# Patient Record
Sex: Female | Born: 1945 | Race: Black or African American | Hispanic: No | Smoking: Never smoker
Health system: Southern US, Community
[De-identification: ages and names within clinical notes are randomized; demographics above are authoritative.]

## PROBLEM LIST (undated history)

## (undated) DIAGNOSIS — R011 Cardiac murmur, unspecified: Secondary | ICD-10-CM

## (undated) DIAGNOSIS — H269 Unspecified cataract: Secondary | ICD-10-CM

## (undated) DIAGNOSIS — T7840XA Allergy, unspecified, initial encounter: Secondary | ICD-10-CM

## (undated) DIAGNOSIS — M1990 Unspecified osteoarthritis, unspecified site: Secondary | ICD-10-CM

## (undated) HISTORY — PX: BREAST SURGERY: SHX581

## (undated) HISTORY — DX: Unspecified cataract: H26.9

## (undated) HISTORY — PX: TUBAL LIGATION: SHX77

## (undated) HISTORY — DX: Cardiac murmur, unspecified: R01.1

## (undated) HISTORY — DX: Allergy, unspecified, initial encounter: T78.40XA

## (undated) HISTORY — DX: Unspecified osteoarthritis, unspecified site: M19.90

## (undated) HISTORY — PX: AUGMENTATION MAMMAPLASTY: SUR837

## (undated) HISTORY — PX: EYE SURGERY: SHX253

## (undated) HISTORY — PX: COSMETIC SURGERY: SHX468

---

## 1999-05-21 ENCOUNTER — Ambulatory Visit (HOSPITAL_COMMUNITY): Admission: RE | Admit: 1999-05-21 | Discharge: 1999-05-21 | Payer: Self-pay | Admitting: Gastroenterology

## 2000-02-07 ENCOUNTER — Encounter: Payer: Self-pay | Admitting: *Deleted

## 2000-02-07 ENCOUNTER — Emergency Department (HOSPITAL_COMMUNITY): Admission: EM | Admit: 2000-02-07 | Discharge: 2000-02-07 | Payer: Self-pay | Admitting: *Deleted

## 2003-04-13 ENCOUNTER — Encounter: Admission: RE | Admit: 2003-04-13 | Discharge: 2003-04-13 | Payer: Self-pay | Admitting: Plastic Surgery

## 2004-03-27 ENCOUNTER — Other Ambulatory Visit: Admission: RE | Admit: 2004-03-27 | Discharge: 2004-03-27 | Payer: Self-pay | Admitting: Gynecology

## 2006-10-30 ENCOUNTER — Emergency Department (HOSPITAL_COMMUNITY): Admission: EM | Admit: 2006-10-30 | Discharge: 2006-10-30 | Payer: Self-pay | Admitting: Emergency Medicine

## 2008-01-21 ENCOUNTER — Ambulatory Visit: Payer: Self-pay | Admitting: Gastroenterology

## 2008-01-25 ENCOUNTER — Encounter: Admission: RE | Admit: 2008-01-25 | Discharge: 2008-01-25 | Payer: Self-pay | Admitting: Family Medicine

## 2008-02-01 ENCOUNTER — Ambulatory Visit: Payer: Self-pay | Admitting: Gastroenterology

## 2008-02-01 ENCOUNTER — Encounter: Payer: Self-pay | Admitting: Gastroenterology

## 2008-02-03 ENCOUNTER — Encounter: Payer: Self-pay | Admitting: Gastroenterology

## 2010-04-16 ENCOUNTER — Encounter
Admission: RE | Admit: 2010-04-16 | Discharge: 2010-04-16 | Payer: Self-pay | Source: Home / Self Care | Attending: Family Medicine | Admitting: Family Medicine

## 2010-05-07 NOTE — Procedures (Signed)
Summary: Colonoscopy   Colonoscopy  Procedure date:  02/01/2008  Findings:      Location:  [facility].    Procedures Next Due Date:    Colonoscopy: 02/2013  Patient Name: Danielle Kirby, Danielle Kirby MRN:  Procedure Procedures: Colonoscopy CPT: 26948.    with polypectomy. CPT: A3573898.  Personnel: Endoscopist: Barbette Hair. Arlyce Dice, MD.  Exam Location: Outpatient  Patient Consent: Procedure, Alternatives, Risks and Benefits discussed, consent obtained, from patient.  Indications  Average Risk Screening Routine.  History  Current Medications: Patient is not currently taking Coumadin.  Pre-Exam Physical: Performed Feb 01, 2008. Cardio-pulmonary exam, HEENT exam , Abdominal exam, Mental status exam WNL.  Comments: Patient history reviewed/updated, physical performed prior to initiation of sedation? Exam Exam: Extent of exam reached: Cecum, extent intended: Cecum.  The cecum was identified by IC valve. Time to Cecum: 00:02:25. Time for Withdrawl: 00:10:43. Colon retroflexion performed. ASA Classification: I. Tolerance: good.  Monitoring: Pulse and BP monitoring, Oximetry used. Supplemental O2 given. at 2 Liters.  Colon Prep Used Miralax for colon prep. Prep results: good.  Sedation Meds: Patient assessed and found to be appropriate for moderate (conscious) sedation. Sedation was managed by the Endoscopist. Fentanyl 50 mcg. given IV. Versed 5 mg. given IV.  Findings - NORMAL EXAM: Cecum to Descending Colon.  - DIVERTICULOSIS: Descending Colon to Sigmoid Colon. ICD9: Diverticulosis: 562.10. Comments: Scattered diverticula.  NORMAL EXAM: Cecum.  POLYP: Sigmoid Colon, Maximum size: 15 mm. pedunculated polyp. Distance from Anus 35 cm. Procedure:  snare with cautery, Polyp sent to pathology. ICD9: Colon Polyps: 211.3.  - NORMAL EXAM: Sigmoid Colon to Rectum.   Assessment Abnormal examination, see findings above.  Diagnoses: 562.10: Diverticulosis.  211.3: Colon  Polyps.   Events  Unplanned Interventions: No intervention was required.  Unplanned Events: There were no complications. Plans  Post Exam Instructions: Post sedation instructions given.  Patient Education: Patient given standard instructions for: Polyps. Diverticulosis.  Disposition: After procedure patient sent to recovery. After recovery patient sent home.  Scheduling/Referral: Colonoscopy, to Barbette Hair. Arlyce Dice, MD, around Jan 31, 2013.    cc.   Marchia Meiers    REPORT OF SURGICAL PATHOLOGY   Case #: [id number] Patient Name: Danielle Kirby, Danielle Kirby. Office Chart Number:  [id number]   MRN: [id number] Pathologist: Beulah Gandy. Luisa Hart, MD DOB/Age  02-Apr-1946 (Age: 65)    Gender: F Date Taken:  02/01/2008 Date Received: 02/01/2008   FINAL DIAGNOSIS   ***MICROSCOPIC EXAMINATION AND DIAGNOSIS***   COLON, DESCENDING POLYP:  TUBULAR ADENOMA.  NO HIGH GRADE DYSPLASIA OR MALIGNANCY IDENTIFIED.   kv Date Reported:  02/02/2008     Beulah Gandy. Luisa Hart, MD *** Electronically Signed Out By JDP ***   Clinical information R/O adenoma Polypectomy  Screening (jes)    specimen(s) obtained Colon, biopsy, descending   Gross Description Received in formalin is a tan, soft tissue fragment that is submitted in toto.  Size:  0.3 cm One block (ML:jes,02/02/08)    jes/     Signed by Louis Meckel MD on 02/03/2008 at 8:56 AM  ________________________________________________________________________ colo 5 years   Signed by Louis Meckel MD on 02/03/2008 at 8:56 AM   February 03, 2008 MRN: [id number]    [facility] [address] [place], [place]  [address]    Dear Ms. Delair,  I am pleased to inform you that the colon polyp(s) removed during your recent colonoscopy was (were) found to be benign (no cancer detected) upon pathologic examination.  I recommend you have a  repeat colonoscopy examination in _5 years to look for recurrent polyps, as having colon polyps increases  your risk for having recurrent polyps or even colon cancer in the future.  Should you develop new or worsening symptoms of abdominal pain, bowel habit changes or bleeding from the rectum or bowels, please schedule an evaluation with either your primary care physician or with me.  Additional information/recommendations:  __ No further action with gastroenterology is needed at this time. Please      follow-up with your primary care physician for your other healthcare      needs.  __ Please call [phone] to schedule a return visit to review your      situation.  __ Please keep your follow-up visit as already scheduled.  _x_ Continue treatment plan as outlined the day of your exam.  Please call us if you are having persistent problems or have questions about your condition that have not been fully answered at this time.  Sincerely,  Louis Meckel MD  This letter has been electronically signed by your physician.   Signed by Louis Meckel MD on 02/03/2008 at 8:57 AM  _________________   This report was created from the original endoscopy report, which was reviewed and signed by the above listed endoscopist.

## 2010-05-07 NOTE — Miscellaneous (Signed)
Summary: GI PV  Clinical Lists Changes  Medications: Added new medication of DULCOLAX 5 MG  TBEC (BISACODYL) Day before procedure take 2 at 3pm and 2 at 8pm. - Signed Added new medication of METOCLOPRAMIDE HCL 10 MG  TABS (METOCLOPRAMIDE HCL) As per prep instructions. - Signed Added new medication of MIRALAX   POWD (POLYETHYLENE GLYCOL 3350) As per prep  instructions. - Signed Rx of DULCOLAX 5 MG  TBEC (BISACODYL) Day before procedure take 2 at 3pm and 2 at 8pm.;  #4 x 0;  Signed;  Entered by: Doristine Church RN II;  Authorized by: Louis Meckel MD;  Method used: [facility] to [facility]  [address], [place], [place]  [address], Ph: [phone] or [phone], Fax: [phone] Rx of METOCLOPRAMIDE HCL 10 MG  TABS (METOCLOPRAMIDE HCL) As per prep instructions.;  #2 x 0;  Signed;  Entered by: Doristine Church RN II;  Authorized by: Louis Meckel MD;  Method used: [facility]ically [facility]  [place], [place], [place]  [address], Ph: [phone] or [phone], Fax: [phone] Rx of MIRALAX   POWD (POLYETHYLENE GLYCOL 3350) As per prep  instructions.;  #255gm x 0;  Signed;  Entered by: Doristine Church RN II;  Authorized by: Louis Meckel MD;  Method used: [facility]  [place] [place], [place]  [address], Ph: [phone] or [phone], Fax: [phone] Allergies: Added new allergy or adverse reaction of PCN Observations: Added new observation of ALLERGY REV: Done (01/21/2008 13:41) Added new observation of NKA: F (01/21/2008 13:41)    Prescriptions: MIRALAX   POWD (POLYETHYLENE GLYCOL 3350) As per prep  instructions.  #255gm x 0   Entered by:   Doristine Church RN II   Authorized by:   Louis Meckel MD   Signed by:   Doristine Church RN II on 01/21/2008   Method used:   [facility] to        [facility]  [place] (retail)       [address]       [place], [place]  [address]  Ph: [phone] or [phone]       Fax: [phone]   RxID:   [id number] METOCLOPRAMIDE HCL 10 MG  TABS (METOCLOPRAMIDE HCL) As per prep instructions.  #2 x 0   Entered by:   Doristine Church RN II   Authorized by:   Louis Meckel MD   Signed by:   Doristine Church RN II on 01/21/2008   Method used:   [facility]        [facility]  [place] (retail)       [address]       [place], [place]  [address]       Ph: [phone] or [phone]       Fax: [phone]   RxID:   [id number] DULCOLAX 5 MG  TBEC (BISACODYL) Day before procedure take 2 at 3pm and 2 at 8pm.  #4 x 0   Entered by:   Doristine Church RN II   Authorized by:   Louis Meckel MD   Signed by:   Doristine Church RN II on 01/21/2008   Method used:   [facility]        [facility]  [address] (retail)       [address]       [place], [place]  [address]       Ph: [phone] or [phone]  Fax: [phone]   RxID:   [id number]

## 2010-05-07 NOTE — Letter (Signed)
Summary: Patient Notice- Polyp Results  [facility]  [address] [place], [place] [address]   Phone: [phone]  Fax: [phone]        February 03, 2008 MRN: [id number]    [facility] [address] [place], [place]  [address]    Dear Danielle Kirby,  I am pleased to inform you that the colon polyp(s) removed during your recent colonoscopy was (were) found to be benign (no cancer detected) upon pathologic examination.  I recommend you have a repeat colonoscopy examination in _5 years to look for recurrent polyps, as having colon polyps increases your risk for having recurrent polyps or even colon cancer in the future.  Should you develop new or worsening symptoms of abdominal pain, bowel habit changes or bleeding from the rectum or bowels, please schedule an evaluation with either your primary care physician or with me.  Additional information/recommendations:  __ No further action with gastroenterology is needed at this time. Please      follow-up with your primary care physician for your other healthcare      needs.  __ Please call [phone] to schedule a return visit to review your      situation.  __ Please keep your follow-up visit as already scheduled.  _x_ Continue treatment plan as outlined the day of your exam.  Please call us if you are having persistent problems or have questions about your condition that have not been fully answered at this time.  Sincerely,  Louis Meckel MD  This letter has been electronically signed by your physician.

## 2010-08-23 NOTE — Procedures (Signed)
[place]  Patient:    Danielle Kirby, Danielle Kirby                      MRN: [id number] Proc. Date: 05/21/99 Adm. Date:  [id number] Attending:  Charna Elizabeth CC:         Heather Roberts, M.D.                           Procedure Report  DATE OF BIRTH:  [birth year]  REFERRING PHYSICIAN:  Heather Roberts, M.D.  PROCEDURE PERFORMED:  Flexible sigmoidoscopy.  ENDOSCOPIST:  Anselmo Rod, M.D.  INSTRUMENT USED:  Olympus video colonoscope.  INDICATIONS:  Screening flexible sigmoidoscopy being performed in a 65 year old  black female.  Rule out polyps, masses, etc.  PREPROCEDURE PREPARATION:  Informed consent was procured from the patient.  The  patient was fasted for 8 hours prior to the procedure and prepped with two Fleets enemas the morning of the procedure.  PREPROCEDURE PHYSICAL:  Patient has stable vital signs.  NECK: Supple.  CHEST:  Clear to auscultation. S1, S2 regular.  ABDOMEN:  Soft with normal abdominal bowel sounds.  DESCRIPTION OF PROCEDURE:  The patient was placed in left lateral decubitus position, no sedation was used.  Once the patient was adequately positioned, the Olympus video colonoscope was advanced from the rectum to 60 cm without difficulty. There was some solid stool at 60 cm and therefore, the procedure was aborted at  this point.  A small sessile polyp was seen at 28 cm and small internal hemorrhoids were seen on retroflexion in the rectum.  The patient tolerated the procedure well. No other abnormalities were present.  IMPRESSION: 1. Small sessile polyp at 28 cm. 2. Small, nonbleeding internal hemorrhoids.  RECOMMENDATIONS:  Patient is to be scheduled for a colonoscopy at the earliest,  further recommendation made thereafter. DD:  05/21/99 TD:  05/21/99 Job: [id number]

## 2011-04-28 ENCOUNTER — Ambulatory Visit (INDEPENDENT_AMBULATORY_CARE_PROVIDER_SITE_OTHER): Payer: Federal, State, Local not specified - PPO

## 2011-04-28 DIAGNOSIS — M25559 Pain in unspecified hip: Secondary | ICD-10-CM

## 2011-04-28 DIAGNOSIS — E789 Disorder of lipoprotein metabolism, unspecified: Secondary | ICD-10-CM

## 2011-05-12 ENCOUNTER — Ambulatory Visit
Admission: RE | Admit: 2011-05-12 | Discharge: 2011-05-12 | Disposition: A | Discharge status: Home / Self Care | Payer: Federal, State, Local not specified - PPO | Source: Ambulatory Visit | Attending: Family Medicine | Admitting: Family Medicine

## 2011-05-12 ENCOUNTER — Ambulatory Visit (INDEPENDENT_AMBULATORY_CARE_PROVIDER_SITE_OTHER): Payer: Federal, State, Local not specified - PPO | Admitting: Family Medicine

## 2011-05-12 ENCOUNTER — Other Ambulatory Visit: Payer: Self-pay | Admitting: Family Medicine

## 2011-05-12 VITALS — BP 140/90 | Ht 67.0 in | Wt 180.0 lb

## 2011-05-12 DIAGNOSIS — M1610 Unilateral primary osteoarthritis, unspecified hip: Secondary | ICD-10-CM

## 2011-05-12 DIAGNOSIS — R030 Elevated blood-pressure reading, without diagnosis of hypertension: Secondary | ICD-10-CM

## 2011-05-12 NOTE — Patient Instructions (Signed)
Think we are dealing arthritis in her right hip. I would like you to do a trial of 500 mg Tylenol, take 2 of the 500 mg tablets twice a day for 3 days. Compare the pain level at the end of those 3 days with what you're having now. I have set up for some x-rays. I will actually get x-rays of both hips as I think that will be helpful to do some comparison. I would like to see her back next week. It was great to meet you

## 2011-05-12 NOTE — Progress Notes (Signed)
  Subjective:    Patient ID: Danielle Kirby, female    DOB: Aug 28, 1945, 66 y.o.   MRN: [id number]  HPI  Right thigh in the hip pain for about 3 months. Started noticing it in October or November. Came on gradually. Initially started as an aching in her anterior right thigh. Her doctor gave her some type of medicine, she is unclear exactly what it was, indeed improve while she was on the medicine but when she stopped it came back. She has pain it is worse if she climbs stairs. She has aching pain particularly at night.  PERTINENT  PMH / PSH: History of elevated blood pressure without diagnosis of hypertension. She says she is taking some herbal remedies to help keep this under control. No prior history of hip injury or surgery. No history of back injury or surgery. Nonsmoker No DM  Review of Systems    denies numbness or paresthesias in either lower extremity. Leg does not feel week. Objective:   Physical Exam  Vital signs reviewed. GENERAL: Well developed, well nourished, no acute distress HIPS: Slight decrease in internal rotation and external rotation on the right compared with the left. There is about 5-10 difference, and notably there is some fairly significant pain with internal rotation and flexion as well as external rotation and flexion. Pain with figure of 4. NEURO:DTR 2+ B = knee and ankle MSK: normal muscle bulk and tone B quadriceps     Assessment & Plan:  Right hip and thigh pain. Most consistent with hip arthritis. We discussed options, she really doesn't want to be on any kind of chronic medication but agrees to give Tylenol a  3 day trial. She's also going to investigate other herbal supplements for analgesia. We'll get hip films and  see her back in a week.

## 2011-05-13 ENCOUNTER — Encounter: Payer: Self-pay | Admitting: Family Medicine

## 2011-05-13 NOTE — Progress Notes (Signed)
Reviewed hip x rays---disagree with radiology reading---to me both hips have some significant changes of OA with sclerosis. Osteophytees, small amount of flattening of acetabulum (right) and esp the right hip looks more significant for OA. She has f/u 1 week and will review with her at that time

## 2011-05-19 ENCOUNTER — Ambulatory Visit: Payer: Federal, State, Local not specified - PPO | Admitting: Family Medicine

## 2011-05-23 ENCOUNTER — Ambulatory Visit (INDEPENDENT_AMBULATORY_CARE_PROVIDER_SITE_OTHER): Payer: Federal, State, Local not specified - PPO | Admitting: Family Medicine

## 2011-05-23 VITALS — BP 146/86

## 2011-05-23 DIAGNOSIS — M169 Osteoarthritis of hip, unspecified: Secondary | ICD-10-CM

## 2011-05-23 NOTE — Progress Notes (Signed)
  Subjective:    Patient ID: Danielle Kirby, female    DOB: [birth year], 66 y.o.   MRN: [id number]  HPI  Followup pain: Took Tylenol for 3 days and the pain went almost entirely away. She stopped taking the Tylenol and has not really had much pain return. She did have x-rays done and is here to review those.  Review of Systems Denies fever.    Objective:   Physical Exam   Vital signs reviewed. Slight elevation in blood pressure noted and discussed with patient GENERAL: Well developed, well nourished, no acute distress  IMAGING: Bilateral hip films show some sclerosis, osteophyte formation, minimal joint space loss and some beginning osteophyte formation consistent with moderate arthritis.      Assessment & Plan:  #1. Bilateral hip pain secondary to moderate arthritis. Discussed with patient at length. Tylenol seem to work really well for her but she is much more inclined to use some natural remedies. We discussed this at length. Whatever seems to work for her. I do think Tylenol would be the first line medication that I would recommend a recommended dose of up to 3 g a day. She can followup when necessary. I did review her films with her in detail. #2. Elevated blood pressure. This is being followed by her PCP I urged her to return to him for further followup

## 2011-08-20 ENCOUNTER — Ambulatory Visit (INDEPENDENT_AMBULATORY_CARE_PROVIDER_SITE_OTHER): Payer: Federal, State, Local not specified - PPO | Admitting: Family Medicine

## 2011-08-20 VITALS — BP 157/98 | HR 69 | Temp 98.4°F | Resp 18 | Ht 67.5 in | Wt 181.8 lb

## 2011-08-20 DIAGNOSIS — E785 Hyperlipidemia, unspecified: Secondary | ICD-10-CM

## 2011-08-20 DIAGNOSIS — I10 Essential (primary) hypertension: Secondary | ICD-10-CM

## 2011-08-20 LAB — LIPID PANEL
Cholesterol: 207 mg/dL — ABNORMAL HIGH (ref 0–200)
HDL: 65 mg/dL (ref >39)
LDL Cholesterol: 130 mg/dL — ABNORMAL HIGH (ref 0–99)
Total CHOL/HDL Ratio: 3.2 Ratio
Triglycerides: 62 mg/dL (ref <150)
VLDL: 12 mg/dL (ref 0–40)

## 2011-08-20 LAB — COMPREHENSIVE METABOLIC PANEL
ALT: 17 U/L (ref 0–35)
AST: 23 U/L (ref 0–37)
Albumin: 4.5 g/dL (ref 3.5–5.2)
Alkaline Phosphatase: 79 U/L (ref 39–117)
BUN: 9 mg/dL (ref 6–23)
CO2: 27 mEq/L (ref 19–32)
Calcium: 9.8 mg/dL (ref 8.4–10.5)
Chloride: 107 mEq/L (ref 96–112)
Creat: 0.72 mg/dL (ref 0.50–1.10)
Glucose, Bld: 84 mg/dL (ref 70–99)
Potassium: 4.2 mEq/L (ref 3.5–5.3)
Sodium: 141 mEq/L (ref 135–145)
Total Bilirubin: 0.5 mg/dL (ref 0.3–1.2)
Total Protein: 7.5 g/dL (ref 6.0–8.3)

## 2011-08-20 MED ORDER — AMLODIPINE BESYLATE 5 MG PO TABS: 5.0000 mg | ORAL_TABLET | Freq: Every day | ORAL | Status: DC

## 2011-08-20 NOTE — Progress Notes (Signed)
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 He comes in with recent blood pressure elevations. She notes that she's been less active the last 2 months by walking in front of a computer all the time. In addition she's taking some supplements to help control cholesterol.  She wants to have the blood pressure evaluated and get a cholesterol evaluated.    Objective: HEENT: Unremarkable  Patient in no acute distress, cooperative and calm  Chest: Clear  Heart: Regular no murmur  Abdomen soft  Assessment: Middle-age woman with recent elevation of blood pressure, history of elevated cholesterol  Plan: Start amlodipine, check lipids, check cemented.  Best patient to start walking at least 20 minutes a day and when back in July.

## 2011-10-16 ENCOUNTER — Ambulatory Visit: Payer: Federal, State, Local not specified - PPO | Admitting: Family Medicine

## 2012-02-25 ENCOUNTER — Ambulatory Visit (INDEPENDENT_AMBULATORY_CARE_PROVIDER_SITE_OTHER): Payer: Federal, State, Local not specified - PPO | Admitting: Family Medicine

## 2012-02-25 VITALS — BP 126/84 | HR 71 | Temp 98.2°F | Resp 17 | Ht 67.5 in | Wt 192.0 lb

## 2012-02-25 DIAGNOSIS — L608 Other nail disorders: Secondary | ICD-10-CM

## 2012-02-25 DIAGNOSIS — I10 Essential (primary) hypertension: Secondary | ICD-10-CM

## 2012-02-25 NOTE — Progress Notes (Signed)
66 yo woman with hypertension who is now 90 days into the Norvasc.  No problems taking the medicine.  Also, she has vertical hyperpigmented streaks on multiple toenails She takes multiple supplements   O:  Toenails show dark vertical streaks on great toenails and smaller nails as well  A:  Blood pressure now at goal  P:  Hold the amlodipine for 2 weeks I will research the vertical nail hyperpigmentation.

## 2012-02-27 ENCOUNTER — Other Ambulatory Visit: Payer: Self-pay | Admitting: Family Medicine

## 2012-02-27 DIAGNOSIS — L819 Disorder of pigmentation, unspecified: Secondary | ICD-10-CM

## 2012-07-03 ENCOUNTER — Encounter: Payer: Self-pay | Admitting: Family Medicine

## 2012-07-03 ENCOUNTER — Ambulatory Visit (INDEPENDENT_AMBULATORY_CARE_PROVIDER_SITE_OTHER): Payer: Federal, State, Local not specified - PPO | Admitting: Family Medicine

## 2012-07-03 VITALS — BP 158/83 | HR 71 | Temp 97.8°F | Resp 16 | Ht 67.5 in | Wt 190.6 lb

## 2012-07-03 DIAGNOSIS — B354 Tinea corporis: Secondary | ICD-10-CM

## 2012-07-03 MED ORDER — KETOCONAZOLE 2 % EX CREA: TOPICAL_CREAM | Freq: Every day | CUTANEOUS | Status: DC

## 2012-07-03 NOTE — Progress Notes (Signed)
67 yo woman with right sided chest and neck annular itchy rash for several weeks, enlarging steadily.  She has been trying a zinc product  Objective:  NAD 3 cm annular right upper anterior chest rash characterized by hyperpigmentation.  2 cm annular right anterior neck rash.  Assessment:  Tinea corporis  Tinea corporis - Plan: ketoconazole (NIZORAL) 2 % cream

## 2012-07-03 NOTE — Patient Instructions (Addendum)
Ringworm, Body [Tinea Corporis] Ringworm is a fungal infection of the skin and hair. Another name for this problem is Tinea Corporis. It has nothing to do with worms. A fungus is an organism that lives on dead cells (the outer layer of skin). It can involve the entire body. It can spread from infected pets. Tinea corporis can be a problem in wrestlers who may get the infection form other players/opponents, equipment and mats. DIAGNOSIS  A skin scraping can be obtained from the affected area and by looking for fungus under the microscope. This is called a KOH examination.  HOME CARE INSTRUCTIONS   Ringworm may be treated with a topical antifungal cream, ointment, or oral medications.  If you are using a cream or ointment, wash infected skin. Dry it completely before application.  Scrub the skin with a buff puff or abrasive sponge using a shampoo with ketoconazole to remove dead skin and help treat the ringworm.  Have your pet treated by your veterinarian if it has the same infection. SEEK MEDICAL CARE IF:   Your ringworm patch (fungus) continues to spread after 7 days of treatment.  Your rash is not gone in 4 weeks. Fungal infections are slow to respond to treatment. Some redness (erythema) may remain for several weeks after the fungus is gone.  The area becomes red, warm, tender, and swollen beyond the patch. This may be a secondary bacterial (germ) infection.  You have a fever. Document Released: 03/21/2000 Document Revised: 06/16/2011 Document Reviewed: 09/01/2008 Exit[employer] Patient Information 2013 [employer].  

## 2012-09-03 ENCOUNTER — Other Ambulatory Visit: Payer: Self-pay | Admitting: Family Medicine

## 2012-09-03 DIAGNOSIS — M549 Dorsalgia, unspecified: Secondary | ICD-10-CM

## 2012-09-03 MED ORDER — CYCLOBENZAPRINE HCL 10 MG PO TABS: 10.0000 mg | ORAL_TABLET | Freq: Two times a day (BID) | ORAL | Status: DC | PRN

## 2012-09-03 MED ORDER — PREDNISONE 20 MG PO TABS: ORAL_TABLET | ORAL | Status: DC

## 2012-09-30 ENCOUNTER — Encounter: Payer: Self-pay | Admitting: Family Medicine

## 2012-09-30 ENCOUNTER — Ambulatory Visit (INDEPENDENT_AMBULATORY_CARE_PROVIDER_SITE_OTHER): Payer: Federal, State, Local not specified - PPO | Admitting: Family Medicine

## 2012-09-30 VITALS — BP 140/94 | HR 75 | Temp 97.8°F | Resp 16 | Ht 67.0 in | Wt 187.0 lb

## 2012-09-30 DIAGNOSIS — Z0000 Encounter for general adult medical examination without abnormal findings: Secondary | ICD-10-CM

## 2012-09-30 DIAGNOSIS — B354 Tinea corporis: Secondary | ICD-10-CM

## 2012-09-30 LAB — COMPREHENSIVE METABOLIC PANEL
ALT: 10 U/L (ref 0–35)
AST: 18 U/L (ref 0–37)
Albumin: 4.2 g/dL (ref 3.5–5.2)
Alkaline Phosphatase: 81 U/L (ref 39–117)
BUN: 12 mg/dL (ref 6–23)
CO2: 26 mEq/L (ref 19–32)
Calcium: 9.5 mg/dL (ref 8.4–10.5)
Chloride: 109 mEq/L (ref 96–112)
Creat: 0.76 mg/dL (ref 0.50–1.10)
Glucose, Bld: 93 mg/dL (ref 70–99)
Potassium: 4.2 mEq/L (ref 3.5–5.3)
Sodium: 142 mEq/L (ref 135–145)
Total Bilirubin: 0.4 mg/dL (ref 0.3–1.2)
Total Protein: 7 g/dL (ref 6.0–8.3)

## 2012-09-30 LAB — CBC WITH DIFFERENTIAL/PLATELET
Basophils Absolute: 0 10*3/uL (ref 0.0–0.1)
Basophils Relative: 1 % (ref 0–1)
Eosinophils Absolute: 0.2 10*3/uL (ref 0.0–0.7)
Eosinophils Relative: 5 % (ref 0–5)
HCT: 36 % (ref 36.0–46.0)
Hemoglobin: 12.3 g/dL (ref 12.0–15.0)
Lymphocytes Relative: 41 % (ref 12–46)
Lymphs Abs: 1.2 10*3/uL (ref 0.7–4.0)
MCH: 28.8 pg (ref 26.0–34.0)
MCHC: 34.2 g/dL (ref 30.0–36.0)
MCV: 84.3 fL (ref 78.0–100.0)
Monocytes Absolute: 0.3 10*3/uL (ref 0.1–1.0)
Monocytes Relative: 8 % (ref 3–12)
Neutro Abs: 1.4 10*3/uL — ABNORMAL LOW (ref 1.7–7.7)
Neutrophils Relative %: 45 % (ref 43–77)
Platelets: 183 10*3/uL (ref 150–400)
RBC: 4.27 MIL/uL (ref 3.87–5.11)
RDW: 14.4 % (ref 11.5–15.5)
WBC: 3 10*3/uL — ABNORMAL LOW (ref 4.0–10.5)

## 2012-09-30 LAB — POCT URINALYSIS DIPSTICK
Bilirubin, UA: NEGATIVE
Glucose, UA: NEGATIVE
Ketones, UA: NEGATIVE
Nitrite, UA: NEGATIVE
Protein, UA: NEGATIVE
Spec Grav, UA: 1.02
Urobilinogen, UA: 0.2
pH, UA: 6

## 2012-09-30 LAB — TSH: TSH: 1.348 u[IU]/mL (ref 0.350–4.500)

## 2012-09-30 LAB — LIPID PANEL
Cholesterol: 212 mg/dL — ABNORMAL HIGH (ref 0–200)
HDL: 61 mg/dL (ref >39)
LDL Cholesterol: 135 mg/dL — ABNORMAL HIGH (ref 0–99)
Total CHOL/HDL Ratio: 3.5 Ratio
Triglycerides: 82 mg/dL (ref <150)
VLDL: 16 mg/dL (ref 0–40)

## 2012-09-30 MED ORDER — PNEUMOCOCCAL VAC POLYVALENT 25 MCG/0.5ML IJ INJ: 0.5000 mL | INJECTION | INTRAMUSCULAR | Status: DC

## 2012-09-30 MED ORDER — KETOCONAZOLE 2 % EX CREA: TOPICAL_CREAM | Freq: Every day | CUTANEOUS | Status: DC

## 2012-09-30 NOTE — Progress Notes (Addendum)
Patient ID: Danielle Kirby MRN: [id number], DOB: [birth year], 67 y.o. Date of Encounter: 09/30/2012, 8:19 AM  Primary Physician: [name], MD  Chief Complaint: Physical (CPE)  HPI: 67 y.o. y/o female with history of noted below here for CPE.  Doing well. No issues/complaints. Sister is in renal failure x 2 months.  Had been in excellent health. Walking some, needs to do more   Pap: 2013 MMG: 2012 Review of Systems: Consitutional: No fever, chills, fatigue, night sweats, lymphadenopathy, or weight changes. Eyes: No visual changes, eye redness, or discharge. ENT/Mouth: Ears: No otalgia, tinnitus, hearing loss, discharge. Nose: No congestion, rhinorrhea, sinus pain, or epistaxis. Throat: No sore throat, post nasal drip, or teeth pain. Cardiovascular: No CP, palpitations, diaphoresis, DOE, edema, orthopnea, PND. Respiratory: No cough, hemoptysis, SOB, or wheezing. Gastrointestinal: No anorexia, dysphagia, reflux, pain, nausea, vomiting, hematemesis, diarrhea, constipation, BRBPR, or melena. Breast: No discharge, pain, swelling, or mass. Genitourinary: No dysuria, frequency, urgency, hematuria, incontinence, nocturia, amenorrhea, vaginal discharge, pruritis, burning, abnormal bleeding, or pain. Musculoskeletal: No decreased ROM, myalgias, stiffness, joint swelling, or weakness. Skin: No rash, erythema, lesion changes, pain, warmth, jaundice, or pruritis. Neurological: No headache, dizziness, syncope, seizures, tremors, memory loss, coordination problems, or paresthesias. Psychological: No anxiety, depression, hallucinations, SI/HI. Endocrine: No fatigue, polydipsia, polyphagia, polyuria, or known diabetes. All other systems were reviewed and are otherwise negative.  Past Medical History  Diagnosis Date  . Allergy   . Arthritis      Past Surgical History  Procedure Laterality Date  . Breast surgery    . Cosmetic surgery    . Tubal ligation      Home Meds:  Prior to  Admission medications   Not on File    Allergies:  Allergies  Allergen Reactions  . Penicillins     REACTION: hives    History   Social History  . Marital Status: Single    Spouse Name: N/A    Number of Children: N/A  . Years of Education: N/A   Occupational History  . Not on file.   Social History Main Topics  . Smoking status: Never Smoker   . Smokeless tobacco: Not on file  . Alcohol Use: No  . Drug Use: No  . Sexually Active: Yes    Birth Control/ Protection: None   Other Topics Concern  . Not on file   Social History Narrative  . No narrative on file    No family history on file.  Physical Exam: 140/80 second check left arm sitting  Blood pressure 140/94, pulse 75, temperature 97.8 F (36.6 C), temperature source Oral, resp. rate 16, height 5\' 7"  (1.702 m), weight 187 lb (84.823 kg), SpO2 99.00%., Body mass index is 29.28 kg/(m^2). General: Well developed, well nourished, in no acute distress. HEENT: Normocephalic, atraumatic. Conjunctiva pink, sclera non-icteric. Pupils 2 mm constricting to 1 mm, round, regular, and equally reactive to light and accomodation. EOMI. Internal auditory canal clear. TMs with good cone of light and without pathology. Nasal mucosa pink. Nares are without discharge. No sinus tenderness. Oral mucosa pink. Dentition good. Pharynx without exudate.   Neck: Supple. Trachea midline. No thyromegaly. Full ROM. No lymphadenopathy. Lungs: Clear to auscultation bilaterally without wheezes, rales, or rhonchi. Breathing is of normal effort and unlabored. Cardiovascular: RRR with S1 S2. No murmurs, rubs, or gallops appreciated. Distal pulses 2+ symmetrically. No carotid or abdominal bruits. Breast: Symmetrical. No masses. Nipples without discharge. Abdomen: Soft, non-tender, non-distended with normoactive bowel sounds. No hepatosplenomegaly or masses. No  rebound/guarding. No CVA tenderness. Without hernias.  Genitourinary:  External genitalia  without lesions. Vaginal mucosa pink. Cervix pink and without discharge. No cervical or adnexal tenderness. Pap smear taken Musculoskeletal: Full range of motion and 5/5 strength throughout. Without swelling, atrophy, tenderness, crepitus, or warmth. Extremities without clubbing, cyanosis, or edema. Calves supple. Skin: Warm and moist without erythema, ecchymosis, wounds  Patient has 2 cm annular right upper chest hyperpigmented plaque with central clearing. Neuro: A+Ox3. CN II-XII grossly intact. Moves all extremities spontaneously. Full sensation throughout. Normal gait. DTR 2+ throughout upper and lower extremities. Finger to nose intact. Psych:  Responds to questions appropriately with a normal affect.   Studies: CBC, CMET, Lipid, TSH, Vitamin D all pending.  Assessment/Plan:  66 y.o. y/o female here for CPE - Annual physical exam - Plan: Comprehensive metabolic panel, Lipid panel, TSH, POCT urinalysis dipstick, CBC with Differential, Vitamin D, 25-hydroxy, Pap IG (Image Guided), DISCONTINUED: pneumococcal 23 valent vaccine (PNU-IMMUNE) injection 0.5 mL  Tinea corporis - Plan: ketoconazole (NIZORAL) 2 % cream  Patient refuses pneumonia vaccine.  Signed, [name], MD 09/30/2012 8:19 AM

## 2012-10-01 LAB — VITAMIN D 25 HYDROXY (VIT D DEFICIENCY, FRACTURES): Vit D, 25-Hydroxy: 24 ng/mL — ABNORMAL LOW (ref 30–89)

## 2012-10-01 LAB — PAP IG (IMAGE GUIDED)

## 2012-11-15 ENCOUNTER — Encounter: Payer: Self-pay | Admitting: Gastroenterology

## 2013-05-20 ENCOUNTER — Ambulatory Visit (INDEPENDENT_AMBULATORY_CARE_PROVIDER_SITE_OTHER): Payer: Federal, State, Local not specified - PPO | Admitting: Family Medicine

## 2013-05-20 ENCOUNTER — Ambulatory Visit: Payer: Federal, State, Local not specified - PPO

## 2013-05-20 VITALS — BP 140/100 | HR 83 | Temp 97.0°F | Resp 16 | Ht 67.0 in | Wt 193.0 lb

## 2013-05-20 DIAGNOSIS — R3915 Urgency of urination: Secondary | ICD-10-CM

## 2013-05-20 DIAGNOSIS — N390 Urinary tract infection, site not specified: Secondary | ICD-10-CM

## 2013-05-20 DIAGNOSIS — M62838 Other muscle spasm: Secondary | ICD-10-CM

## 2013-05-20 DIAGNOSIS — M549 Dorsalgia, unspecified: Secondary | ICD-10-CM

## 2013-05-20 LAB — POCT UA - MICROSCOPIC ONLY
Casts, Ur, LPF, POC: NEGATIVE
Crystals, Ur, HPF, POC: NEGATIVE
Mucus, UA: NEGATIVE
Yeast, UA: NEGATIVE

## 2013-05-20 LAB — BASIC METABOLIC PANEL
BUN: 8 mg/dL (ref 6–23)
CO2: 29 mEq/L (ref 19–32)
Calcium: 10.4 mg/dL (ref 8.4–10.5)
Chloride: 101 mEq/L (ref 96–112)
Creat: 0.7 mg/dL (ref 0.50–1.10)
Glucose, Bld: 90 mg/dL (ref 70–99)
Potassium: 4.5 mEq/L (ref 3.5–5.3)
Sodium: 138 mEq/L (ref 135–145)

## 2013-05-20 LAB — POCT URINALYSIS DIPSTICK
Bilirubin, UA: NEGATIVE
Glucose, UA: NEGATIVE
Ketones, UA: NEGATIVE
Nitrite, UA: NEGATIVE
Protein, UA: NEGATIVE
Spec Grav, UA: <=1.005
Urobilinogen, UA: 0.2
pH, UA: 6.5

## 2013-05-20 MED ORDER — CIPROFLOXACIN HCL 250 MG PO TABS: 250.0000 mg | ORAL_TABLET | Freq: Two times a day (BID) | ORAL | Status: DC

## 2013-05-20 MED ORDER — CYCLOBENZAPRINE HCL 5 MG PO TABS: ORAL_TABLET | ORAL | Status: DC

## 2013-05-20 NOTE — Patient Instructions (Signed)
Start antibiotic for urinary tract infection until urine culture results known. Drink plenty of fluids. Muscle relaxant up to every 8 hours as needed but be careful with this medicine causing sedation and risk of falling. If not improving in the next 2-3 days or worse sooner, return to clinic or ER. Return to the clinic or go to the nearest emergency room if any of your symptoms worsen or new symptoms occur. You should receive a call or letter about your lab results within the next week to 10 days.   Urinary Tract Infection Urinary tract infections (UTIs) can develop anywhere along your urinary tract. Your urinary tract is your body's drainage system for removing wastes and extra water. Your urinary tract includes two kidneys, two ureters, a bladder, and a urethra. Your kidneys are a pair of bean-shaped organs. Each kidney is about the size of your fist. They are located below your ribs, one on each side of your spine. CAUSES Infections are caused by microbes, which are microscopic organisms, including fungi, viruses, and bacteria. These organisms are so small that they can only be seen through a microscope. Bacteria are the microbes that most commonly cause UTIs. SYMPTOMS  Symptoms of UTIs may vary by age and gender of the patient and by the location of the infection. Symptoms in young women typically include a frequent and intense urge to urinate and a painful, burning feeling in the bladder or urethra during urination. Older women and men are more likely to be tired, shaky, and weak and have muscle aches and abdominal pain. A fever may mean the infection is in your kidneys. Other symptoms of a kidney infection include pain in your back or sides below the ribs, nausea, and vomiting. DIAGNOSIS To diagnose a UTI, your caregiver will ask you about your symptoms. Your caregiver also will ask to provide a urine sample. The urine sample will be tested for bacteria and white blood cells. White blood cells are  made by your body to help fight infection. TREATMENT  Typically, UTIs can be treated with medication. Because most UTIs are caused by a bacterial infection, they usually can be treated with the use of antibiotics. The choice of antibiotic and length of treatment depend on your symptoms and the type of bacteria causing your infection. HOME CARE INSTRUCTIONS  If you were prescribed antibiotics, take them exactly as your caregiver instructs you. Finish the medication even if you feel better after you have only taken some of the medication.  Drink enough water and fluids to keep your urine clear or pale yellow.  Avoid caffeine, tea, and carbonated beverages. They tend to irritate your bladder.  Empty your bladder often. Avoid holding urine for long periods of time.  Empty your bladder before and after sexual intercourse.  After a bowel movement, women should cleanse from front to back. Use each tissue only once. SEEK MEDICAL CARE IF:   You have back pain.  You develop a fever.  Your symptoms do not begin to resolve within 3 days. SEEK IMMEDIATE MEDICAL CARE IF:   You have severe back pain or lower abdominal pain.  You develop chills.  You have nausea or vomiting.  You have continued burning or discomfort with urination. MAKE SURE YOU:   Understand these instructions.  Will watch your condition.  Will get help right away if you are not doing well or get worse. Document Released: 01/01/2005 Document Revised: 09/23/2011 Document Reviewed: 05/02/2011 [facility] Patient Information 2014 [place], [place]. Muscle Cramps and  Spasms Muscle cramps and spasms occur when a muscle or muscles tighten and you have no control over this tightening (involuntary muscle contraction). They are a common problem and can develop in any muscle. The most common place is in the calf muscles of the leg. Both muscle cramps and muscle spasms are involuntary muscle contractions, but they also have differences:     Muscle cramps are sporadic and painful. They may last a few seconds to a quarter of an hour. Muscle cramps are often more forceful and last longer than muscle spasms.  Muscle spasms may or may not be painful. They may also last just a few seconds or much longer. CAUSES  It is uncommon for cramps or spasms to be due to a serious underlying problem. In many cases, the cause of cramps or spasms is unknown. Some common causes are:   Overexertion.   Overuse from repetitive motions (doing the same thing over and over).   Remaining in a certain position for a long period of time.   Improper preparation, form, or technique while performing a sport or activity.   Dehydration.   Injury.   Side effects of some medicines.   Abnormally low levels of the salts and ions in your blood (electrolytes), especially potassium and calcium. This could happen if you are taking water pills (diuretics) or you are pregnant.  Some underlying medical problems can make it more likely to develop cramps or spasms. These include, but are not limited to:   Diabetes.   Parkinson disease.   Hormone disorders, such as thyroid problems.   Alcohol abuse.   Diseases specific to muscles, joints, and bones.   Blood vessel disease where not enough blood is getting to the muscles.  HOME CARE INSTRUCTIONS   Stay well hydrated. Drink enough water and fluids to keep your urine clear or pale yellow.  It may be helpful to massage, stretch, and relax the affected muscle.  For tight or tense muscles, use a warm towel, heating pad, or hot shower water directed to the affected area.  If you are sore or have pain after a cramp or spasm, applying ice to the affected area may relieve discomfort.  Put ice in a plastic bag.  Place a towel between your skin and the bag.  Leave the ice on for 15-20 minutes, 03-04 times a day.  Medicines used to treat a known cause of cramps or spasms may help reduce their  frequency or severity. Only take over-the-counter or prescription medicines as directed by your caregiver. SEEK MEDICAL CARE IF:  Your cramps or spasms get more severe, more frequent, or do not improve over time.  MAKE SURE YOU:   Understand these instructions.  Will watch your condition.  Will get help right away if you are not doing well or get worse. Document Released: 09/13/2001 Document Revised: 07/19/2012 Document Reviewed: 03/10/2012 [facility] Patient Information 2014 [place], [place].

## 2013-05-20 NOTE — Progress Notes (Addendum)
Subjective:    Patient ID: Danielle Kirby, female    DOB: 08/31/1945, 68 y.o.   MRN: [id number] This chart was scribed for Meredith Staggers, MD by Danella Maiers, ED Scribe. This patient was seen in room 3 and the patient's care was started at 12:43 PM.  Chief Complaint  Patient presents with  . Generalized Body Aches    HPI HPI Comments: Danielle Kirby is a 68 y.o. female with a h/o arthritis who presents to the [facility] complaining of middle and upper back pain onset 3 days ago. She states it feels similar to muscle spasms she has had in the past. Pt states she helped her sister moved 2 weeks ago but does not remember any injuries or trauma. She has not taken any OTC pain meds. She denies SOB but states it is difficult to breathe deeply due to pain from her back "catching". She reports urgency and states that sometimes small amounts of urine comes out before she can make it to the bathroom. She reports feeling warm on her arms but she has not taken her temperature. She denies numbness or tingling in the back, weakness in the legs. She is not on blood pressure medication. She does take a multivitamin.  PCP [name], MD  Patient Active Problem List   Diagnosis Date Noted  . Degenerative arthritis of hip 05/23/2011  . Elevated blood-pressure reading without diagnosis of hypertension 05/12/2011   Past Medical History  Diagnosis Date  . Allergy   . Arthritis    Past Surgical History  Procedure Laterality Date  . Breast surgery    . Cosmetic surgery    . Tubal ligation     Allergies  Allergen Reactions  . Penicillins     REACTION: hives   Prior to Admission medications   Medication Sig Start Date End Date Taking? Authorizing Provider  ketoconazole (NIZORAL) 2 % cream Apply topically daily. 09/30/12   [name], MD    History  Substance Use Topics  . Smoking status: Never Smoker   . Smokeless tobacco: Not on file  . Alcohol Use: No       Review of Systems  Constitutional: Negative for fatigue and unexpected weight change.  Respiratory: Negative for chest tightness and shortness of breath.   Cardiovascular: Negative for chest pain, palpitations and leg swelling.  Gastrointestinal: Negative for abdominal pain and blood in stool.  Genitourinary: Positive for urgency and enuresis (minimal).  Musculoskeletal: Positive for back pain.  Neurological: Negative for dizziness, syncope, weakness, light-headedness and headaches.       Objective:   Physical Exam  Vitals reviewed. Constitutional: She is oriented to person, place, and time. She appears well-developed and well-nourished.  HENT:  Head: Normocephalic and atraumatic.  Eyes: Conjunctivae and EOM are normal. Pupils are equal, round, and reactive to light.  Neck: Carotid bruit is not present.  Cardiovascular: Normal rate, regular rhythm, normal heart sounds and intact distal pulses.   Pulmonary/Chest: Effort normal and breath sounds normal.  Abdominal: Soft. She exhibits no pulsatile midline mass. There is no tenderness.  Musculoskeletal:  Described area of pain is the right mid thoracic spine and paraspinal muscles. No midline bony tenderness. No CVA tederness. LS spine full ROM but did feel a grabbing sensation with right ROM. Negative straight leg raise. Able to heel toe walk without difficulty.  Neurological: She is alert and oriented to person, place, and time.  Skin: Skin is warm and dry.  Psychiatric:  She has a normal mood and affect. Her behavior is normal.   [facility] preliminary x-ray report read by Dr. Neva Seat:  T spine: : nad, no apparent compresssion fx.   Filed Vitals:   05/20/13 1217  BP: 140/100  Pulse: 83  Temp: 97 F (36.1 C)  TempSrc: Oral  Resp: 16  Height: 5\' 7"  (1.702 m)  Weight: 193 lb (87.544 kg)  SpO2: 100%     Results for orders placed in visit on 05/20/13  POCT UA - MICROSCOPIC ONLY      Result Value Ref Range   WBC, Ur, HPF,  POC 4-6     RBC, urine, microscopic 1-2     Bacteria, U Microscopic 4+     Mucus, UA neg     Epithelial cells, urine per micros 5-10     Crystals, Ur, HPF, POC neg     Casts, Ur, LPF, POC neg     Yeast, UA neg    POCT URINALYSIS DIPSTICK      Result Value Ref Range   Color, UA yellow     Clarity, UA hazy     Glucose, UA neg     Bilirubin, UA neg     Ketones, UA neg     Spec Grav, UA <=1.005     Blood, UA moderate     pH, UA 6.5     Protein, UA neg     Urobilinogen, UA 0.2     Nitrite, UA neg     Leukocytes, UA large (3+)         Assessment & Plan:   LETISIA SCHWALB is a 68 y.o. female Back pain, Muscle spasm - Plan: DG Thoracic Spine 2 View, POCT UA - Microscopic Only, Basic metabolic panel, cyclobenzaprine (FLEXERIL) 5 MG   -suspected spasm with prior move, but will check BMP, incr fluids, trial of flexeril. Rtc/er precautions, especially if any CP or dyspnea.   Urgency of urination - Plan: DG Thoracic Spine 2 View, POCT UA - Microscopic Only, POCT urinalysis dipstick, Basic metabolic panel, Urine culture, ciprofloxacin (CIPRO) 250 MG tablet, UTI (urinary tract infection) - Plan: Urine culture, ciprofloxacin (CIPRO) 250 MG tablet  -Cipro until cx results known. Push fluids. rtc precautions.    Meds ordered this encounter  Medications  . ciprofloxacin (CIPRO) 250 MG tablet    Sig: Take 1 tablet (250 mg total) by mouth 2 (two) times daily.    Dispense:  10 tablet    Refill:  0  . cyclobenzaprine (FLEXERIL) 5 MG tablet    Sig: 1 pill by mouth up to every 8 hours as needed. Start with one pill by mouth each bedtime as needed due to sedation    Dispense:  15 tablet    Refill:  0   Patient Instructions  Start antibiotic for urinary tract infection until urine culture results known. Drink plenty of fluids. Muscle relaxant up to every 8 hours as needed but be careful with this medicine causing sedation and risk of falling. If not improving in the next 2-3 days or worse sooner,  return to clinic or ER. Return to the clinic or go to the nearest emergency room if any of your symptoms worsen or new symptoms occur. You should receive a call or letter about your lab results within the next week to 10 days.   Urinary Tract Infection Urinary tract infections (UTIs) can develop anywhere along your urinary tract. Your urinary tract is your body's drainage system for removing  wastes and extra water. Your urinary tract includes two kidneys, two ureters, a bladder, and a urethra. Your kidneys are a pair of bean-shaped organs. Each kidney is about the size of your fist. They are located below your ribs, one on each side of your spine. CAUSES Infections are caused by microbes, which are microscopic organisms, including fungi, viruses, and bacteria. These organisms are so small that they can only be seen through a microscope. Bacteria are the microbes that most commonly cause UTIs. SYMPTOMS  Symptoms of UTIs may vary by age and gender of the patient and by the location of the infection. Symptoms in young women typically include a frequent and intense urge to urinate and a painful, burning feeling in the bladder or urethra during urination. Older women and men are more likely to be tired, shaky, and weak and have muscle aches and abdominal pain. A fever may mean the infection is in your kidneys. Other symptoms of a kidney infection include pain in your back or sides below the ribs, nausea, and vomiting. DIAGNOSIS To diagnose a UTI, your caregiver will ask you about your symptoms. Your caregiver also will ask to provide a urine sample. The urine sample will be tested for bacteria and white blood cells. White blood cells are made by your body to help fight infection. TREATMENT  Typically, UTIs can be treated with medication. Because most UTIs are caused by a bacterial infection, they usually can be treated with the use of antibiotics. The choice of antibiotic and length of treatment depend on  your symptoms and the type of bacteria causing your infection. HOME CARE INSTRUCTIONS  If you were prescribed antibiotics, take them exactly as your caregiver instructs you. Finish the medication even if you feel better after you have only taken some of the medication.  Drink enough water and fluids to keep your urine clear or pale yellow.  Avoid caffeine, tea, and carbonated beverages. They tend to irritate your bladder.  Empty your bladder often. Avoid holding urine for long periods of time.  Empty your bladder before and after sexual intercourse.  After a bowel movement, women should cleanse from front to back. Use each tissue only once. SEEK MEDICAL CARE IF:   You have back pain.  You develop a fever.  Your symptoms do not begin to resolve within 3 days. SEEK IMMEDIATE MEDICAL CARE IF:   You have severe back pain or lower abdominal pain.  You develop chills.  You have nausea or vomiting.  You have continued burning or discomfort with urination. MAKE SURE YOU:   Understand these instructions.  Will watch your condition.  Will get help right away if you are not doing well or get worse. Document Released: 01/01/2005 Document Revised: 09/23/2011 Document Reviewed: 05/02/2011 [facility] Patient Information 2014 [place], [place]. Muscle Cramps and Spasms Muscle cramps and spasms occur when a muscle or muscles tighten and you have no control over this tightening (involuntary muscle contraction). They are a common problem and can develop in any muscle. The most common place is in the calf muscles of the leg. Both muscle cramps and muscle spasms are involuntary muscle contractions, but they also have differences:   Muscle cramps are sporadic and painful. They may last a few seconds to a quarter of an hour. Muscle cramps are often more forceful and last longer than muscle spasms.  Muscle spasms may or may not be painful. They may also last just a few seconds or much longer. CAUSES   It  is uncommon for cramps or spasms to be due to a serious underlying problem. In many cases, the cause of cramps or spasms is unknown. Some common causes are:   Overexertion.   Overuse from repetitive motions (doing the same thing over and over).   Remaining in a certain position for a long period of time.   Improper preparation, form, or technique while performing a sport or activity.   Dehydration.   Injury.   Side effects of some medicines.   Abnormally low levels of the salts and ions in your blood (electrolytes), especially potassium and calcium. This could happen if you are taking water pills (diuretics) or you are pregnant.  Some underlying medical problems can make it more likely to develop cramps or spasms. These include, but are not limited to:   Diabetes.   Parkinson disease.   Hormone disorders, such as thyroid problems.   Alcohol abuse.   Diseases specific to muscles, joints, and bones.   Blood vessel disease where not enough blood is getting to the muscles.  HOME CARE INSTRUCTIONS   Stay well hydrated. Drink enough water and fluids to keep your urine clear or pale yellow.  It may be helpful to massage, stretch, and relax the affected muscle.  For tight or tense muscles, use a warm towel, heating pad, or hot shower water directed to the affected area.  If you are sore or have pain after a cramp or spasm, applying ice to the affected area may relieve discomfort.  Put ice in a plastic bag.  Place a towel between your skin and the bag.  Leave the ice on for 15-20 minutes, 03-04 times a day.  Medicines used to treat a known cause of cramps or spasms may help reduce their frequency or severity. Only take over-the-counter or prescription medicines as directed by your caregiver. SEEK MEDICAL CARE IF:  Your cramps or spasms get more severe, more frequent, or do not improve over time.  MAKE SURE YOU:   Understand these instructions.  Will  watch your condition.  Will get help right away if you are not doing well or get worse. Document Released: 09/13/2001 Document Revised: 07/19/2012 Document Reviewed: 03/10/2012 [facility] Patient Information 2014 [place], [place].       I personally performed the services described in this documentation, which was scribed in my presence. The recorded information has been reviewed and considered, and addended by me as needed.

## 2013-05-21 ENCOUNTER — Telehealth: Payer: Self-pay

## 2013-05-21 LAB — URINE CULTURE: Colony Count: 45000

## 2013-05-21 NOTE — Telephone Encounter (Signed)
Done in error.

## 2013-07-07 ENCOUNTER — Ambulatory Visit (INDEPENDENT_AMBULATORY_CARE_PROVIDER_SITE_OTHER): Payer: Federal, State, Local not specified - PPO | Admitting: Family Medicine

## 2013-07-07 ENCOUNTER — Encounter: Payer: Self-pay | Admitting: Family Medicine

## 2013-07-07 VITALS — BP 130/90 | HR 66 | Temp 97.6°F | Resp 16 | Ht 67.5 in | Wt 191.6 lb

## 2013-07-07 DIAGNOSIS — Z833 Family history of diabetes mellitus: Secondary | ICD-10-CM

## 2013-07-07 DIAGNOSIS — Z889 Allergy status to unspecified drugs, medicaments and biological substances status: Secondary | ICD-10-CM

## 2013-07-07 DIAGNOSIS — Z9109 Other allergy status, other than to drugs and biological substances: Secondary | ICD-10-CM

## 2013-07-07 LAB — LIPID PANEL
Cholesterol: 207 mg/dL — ABNORMAL HIGH (ref 0–200)
HDL: 66 mg/dL (ref >39)
LDL Cholesterol: 127 mg/dL — ABNORMAL HIGH (ref 0–99)
Total CHOL/HDL Ratio: 3.1 Ratio
Triglycerides: 68 mg/dL (ref <150)
VLDL: 14 mg/dL (ref 0–40)

## 2013-07-07 LAB — COMPREHENSIVE METABOLIC PANEL
ALT: 13 U/L (ref 0–35)
AST: 20 U/L (ref 0–37)
Albumin: 4.1 g/dL (ref 3.5–5.2)
Alkaline Phosphatase: 78 U/L (ref 39–117)
BUN: 10 mg/dL (ref 6–23)
CO2: 25 mEq/L (ref 19–32)
Calcium: 9.4 mg/dL (ref 8.4–10.5)
Chloride: 106 mEq/L (ref 96–112)
Creat: 0.68 mg/dL (ref 0.50–1.10)
Glucose, Bld: 84 mg/dL (ref 70–99)
Potassium: 4.2 mEq/L (ref 3.5–5.3)
Sodium: 138 mEq/L (ref 135–145)
Total Bilirubin: 0.5 mg/dL (ref 0.2–1.2)
Total Protein: 7 g/dL (ref 6.0–8.3)

## 2013-07-07 MED ORDER — FEXOFENADINE HCL 180 MG PO TABS: 180.0000 mg | ORAL_TABLET | Freq: Every day | ORAL | Status: DC

## 2013-07-07 NOTE — Patient Instructions (Signed)

## 2013-07-07 NOTE — Progress Notes (Signed)
Subjective:  This chart was scribed for Danielle Sidle, MD by Carl Best, Medical Scribe. This patient was seen in Room 25 and the patient's care was started at 10:06 AM.   Patient ID: Danielle Kirby, female    DOB: May 17, 1945, 68 y.o.   MRN: [id number]  HPI HPI Comments: Danielle Kirby is a 68 y.o. female who presents to the [facility] complaining of constant rhinorrhea, nasal congestion, sneezing, and watery eyes that started two months ago(February).  She states that she has not bought any new pillows but started using new Argan Oil as a skin softener.  She states that the lotion is fragrant free and she started using it two months ago.  The patient denies taking any medication for her symptoms.  The patient states that she normally suffers from springtime allergies.    She states that she would like to have cholesterol, blood pressure, and hyperglycemia tests conducted.  The patient states that she drank 8 oz of water this morning.    Past Medical History  Diagnosis Date   Allergy    Arthritis    Past Surgical History  Procedure Laterality Date   Breast surgery     Cosmetic surgery     Tubal ligation     No family history on file. History   Social History   Marital Status: Single    Spouse Name: N/A    Number of Children: N/A   Years of Education: N/A   Occupational History   Not on file.   Social History Main Topics   Smoking status: Never Smoker    Smokeless tobacco: Not on file   Alcohol Use: No   Drug Use: No   Sexual Activity: Yes    Birth Control/ Protection: None   Other Topics Concern   Not on file   Social History Narrative   No narrative on file   Allergies  Allergen Reactions   Penicillins     REACTION: hives    Review of Systems  HENT: Positive for congestion, rhinorrhea and sneezing.   Eyes: Positive for discharge.  All other systems reviewed and are negative.     Objective:  Physical Exam    Nursing note and vitals reviewed. Constitutional: She is oriented to person, place, and time. She appears well-developed and well-nourished.  HENT:  Head: Normocephalic and atraumatic.  Swollen pale blue turbinates, watery nasal passages, watery conjunctiva  Eyes: EOM are normal. Pupils are equal, round, and reactive to light.  Neck: Normal range of motion. Neck supple. No thyromegaly present.  Cardiovascular: Normal rate.   Pulmonary/Chest: Effort normal.  Musculoskeletal: Normal range of motion.  Neurological: She is alert and oriented to person, place, and time. No cranial nerve deficit. Coordination normal.  Skin: Skin is warm and dry. No rash noted. No erythema.  Psychiatric: She has a normal mood and affect. Her behavior is normal.     BP 130/90   Pulse 66   Temp(Src) 97.6 F (36.4 C) (Oral)   Resp 16   Ht 5' 7.5" (1.715 m)   Wt 191 lb 9.6 oz (86.909 kg)   BMI 29.55 kg/m2   SpO2 98% Assessment & Plan:    I personally performed the services described in this documentation, which was scribed in my presence. The recorded information has been reviewed and is accurate.   Multiple allergies - Plan: fexofenadine (ALLEGRA) 180 MG tablet  Family history of diabetes mellitus - Plan: Comprehensive metabolic panel,  Lipid panel  Signed, Danielle SidleKurt Lauenstein, MD

## 2013-07-28 ENCOUNTER — Encounter: Payer: Self-pay | Admitting: Gastroenterology

## 2013-09-04 ENCOUNTER — Ambulatory Visit (INDEPENDENT_AMBULATORY_CARE_PROVIDER_SITE_OTHER): Payer: Federal, State, Local not specified - PPO | Admitting: Family Medicine

## 2013-09-04 ENCOUNTER — Ambulatory Visit: Payer: Federal, State, Local not specified - PPO

## 2013-09-04 VITALS — BP 130/78 | HR 90 | Temp 98.9°F | Ht 67.0 in | Wt 192.6 lb

## 2013-09-04 DIAGNOSIS — M1612 Unilateral primary osteoarthritis, left hip: Secondary | ICD-10-CM

## 2013-09-04 DIAGNOSIS — M79609 Pain in unspecified limb: Secondary | ICD-10-CM

## 2013-09-04 DIAGNOSIS — M169 Osteoarthritis of hip, unspecified: Secondary | ICD-10-CM

## 2013-09-04 DIAGNOSIS — M79605 Pain in left leg: Secondary | ICD-10-CM

## 2013-09-04 DIAGNOSIS — M1610 Unilateral primary osteoarthritis, unspecified hip: Secondary | ICD-10-CM

## 2013-09-04 MED ORDER — PREDNISONE 20 MG PO TABS: ORAL_TABLET | ORAL | Status: DC

## 2013-09-04 NOTE — Patient Instructions (Signed)
Osteoarthritis Osteoarthritis is a disease that causes soreness and swelling (inflammation) of a joint. It occurs when the cartilage at the affected joint wears down. Cartilage acts as a cushion, covering the ends of bones where they meet to form a joint. Osteoarthritis is the most common form of arthritis. It often occurs in older people. The joints affected most often by this condition include those in the:  Ends of the fingers.  Thumbs.  Neck.  Lower back.  Knees.  Hips. CAUSES  Over time, the cartilage that covers the ends of bones begins to wear away. This causes bone to rub on bone, producing pain and stiffness in the affected joints.  RISK FACTORS Certain factors can increase your chances of having osteoarthritis, including:  Older age.  Excessive body weight.  Overuse of joints. SIGNS AND SYMPTOMS   Pain, swelling, and stiffness in the joint.  Over time, the joint may lose its normal shape.  Small deposits of bone (osteophytes) may grow on the edges of the joint.  Bits of bone or cartilage can break off and float inside the joint space. This may cause more pain and damage. DIAGNOSIS  Your health care provider will do a physical exam and ask about your symptoms. Various tests may be ordered, such as:  X-rays of the affected joint.  An MRI scan.  Blood tests to rule out other types of arthritis.  Joint fluid tests. This involves using a needle to draw fluid from the joint and examining the fluid under a microscope. TREATMENT  Goals of treatment are to control pain and improve joint function. Treatment plans may include:  A prescribed exercise program that allows for rest and joint relief.  A weight control plan.  Pain relief techniques, such as:  Properly applied heat and cold.  Electric pulses delivered to nerve endings under the skin (transcutaneous electrical nerve stimulation, TENS).  Massage.  Certain nutritional supplements.  Medicines to  control pain, such as:  Acetaminophen.  Nonsteroidal anti-inflammatory drugs (NSAIDs), such as naproxen.  Narcotic or central-acting agents, such as tramadol.  Corticosteroids. These can be given orally or as an injection.  Surgery to reposition the bones and relieve pain (osteotomy) or to remove loose pieces of bone and cartilage. Joint replacement may be needed in advanced states of osteoarthritis. HOME CARE INSTRUCTIONS   Only take over-the-counter or prescription medicines as directed by your health care provider. Take all medicines exactly as instructed.  Maintain a healthy weight. Follow your health care provider's instructions for weight control. This may include dietary instructions.  Exercise as directed. Your health care provider can recommend specific types of exercise. These may include:  Strengthening exercises These are done to strengthen the muscles that support joints affected by arthritis. They can be performed with weights or with exercise bands to add resistance.  Aerobic activities These are exercises, such as brisk walking or low-impact aerobics, that get your heart pumping.  Range-of-motion activities These keep your joints limber.  Balance and agility exercises These help you maintain daily living skills.  Rest your affected joints as directed by your health care provider.  Follow up with your health care provider as directed. SEEK MEDICAL CARE IF:   Your skin turns red.  You develop a rash in addition to your joint pain.  You have worsening joint pain. SEEK IMMEDIATE MEDICAL CARE IF:  You have a significant loss of weight or appetite.  You have a fever along with joint or muscle aches.  You have   night sweats. FOR MORE INFORMATION  National Institute of Arthritis and Musculoskeletal and Skin Diseases: www.niams.nih.gov National Institute on Aging: www.nia.nih.gov American College of Rheumatology: www.rheumatology.org Document Released: 03/24/2005  Document Revised: 01/12/2013 Document Reviewed: 11/29/2012 Exit[employer] Patient Information 2014 [employer].  

## 2013-09-04 NOTE — Progress Notes (Signed)
° °  Subjective:    Patient ID: Danielle Kirby, female    DOB: 1945/08/02, 68 y.o.   MRN: [id number]  HPI Chief Complaint  Patient presents with   leg/foot pain    x 2weeks   This chart was scribed for [name], MD by Andrew Au, ED Scribe. This patient was seen in room 2 and the patient's care was started at 1:14 PM.  HPI Comments: Danielle Kirby is a 68 y.o. female who presents to the [facility] complaining of gradually, worsening, shooting left lateral hip pain onset 2 weeks. Pt reports pain radiates to left lateral leg. Pt has been walking with 1 crutch. Pt states pain is more to the inside of her leg and denies leg being sore. Pt denies any known injury. Pt had X-rays 2/4  for left hip pain but reports she had gotten better. Pt denies back pain.    Past Medical History  Diagnosis Date   Allergy    Arthritis    Allergies  Allergen Reactions   Penicillins     REACTION: hives   Prior to Admission medications   Medication Sig Start Date End Date Taking? Authorizing Provider  cyclobenzaprine (FLEXERIL) 5 MG tablet 1 pill by mouth up to every 8 hours as needed. Start with one pill by mouth each bedtime as needed due to sedation 05/20/13  Yes Shade Flood, MD   Review of Systems  Musculoskeletal: Positive for arthralgias and gait problem. Negative for back pain.       Objective:   Physical Exam  Nursing note and vitals reviewed. Constitutional: She is oriented to person, place, and time. She appears well-developed and well-nourished. No distress.  HENT:  Head: Normocephalic and atraumatic.  Eyes: EOM are normal.  Neck: Neck supple. No tracheal deviation present.  Cardiovascular: Normal rate.   Pulmonary/Chest: Effort normal. No respiratory distress.  Musculoskeletal: Normal range of motion.  Left hip No palpable tenderness Pain with external rotation   Neurological: She is alert and oriented to person, place, and time.  Skin: Skin is  warm and dry.  Psychiatric: She has a normal mood and affect. Her behavior is normal.   [facility] reading (PRIMARY) done by Dr. Milus Glazier-  subchondral erosion to bilateral hips worse on the right       Assessment & Plan:  Left leg pain - Plan: DG Hip Complete Left, predniSONE (DELTASONE) 20 MG tablet  Osteoarthritis of left hip - Plan: DG Hip Complete Left, predniSONE (DELTASONE) 20 MG tablet  Signed, [name], MD

## 2013-09-10 ENCOUNTER — Other Ambulatory Visit: Payer: Self-pay | Admitting: Family Medicine

## 2013-09-10 MED ORDER — METHYLPREDNISOLONE (PAK) 4 MG PO TABS: ORAL_TABLET | ORAL | Status: DC

## 2014-02-02 ENCOUNTER — Encounter: Payer: Self-pay | Admitting: Family Medicine

## 2014-02-02 ENCOUNTER — Ambulatory Visit (INDEPENDENT_AMBULATORY_CARE_PROVIDER_SITE_OTHER): Payer: Federal, State, Local not specified - PPO | Admitting: Family Medicine

## 2014-02-02 VITALS — BP 149/87 | HR 81 | Temp 98.3°F | Resp 16 | Ht 67.5 in | Wt 191.8 lb

## 2014-02-02 DIAGNOSIS — H3530 Unspecified macular degeneration: Secondary | ICD-10-CM | POA: Insufficient documentation

## 2014-02-02 DIAGNOSIS — M1612 Unilateral primary osteoarthritis, left hip: Secondary | ICD-10-CM

## 2014-02-02 DIAGNOSIS — E559 Vitamin D deficiency, unspecified: Secondary | ICD-10-CM

## 2014-02-02 DIAGNOSIS — E785 Hyperlipidemia, unspecified: Secondary | ICD-10-CM

## 2014-02-02 DIAGNOSIS — I10 Essential (primary) hypertension: Secondary | ICD-10-CM

## 2014-02-02 LAB — CBC WITH DIFFERENTIAL/PLATELET
Basophils Absolute: 0 10*3/uL (ref 0.0–0.1)
Basophils Relative: 0 % (ref 0–1)
Eosinophils Absolute: 0 10*3/uL (ref 0.0–0.7)
Eosinophils Relative: 1 % (ref 0–5)
HCT: 36.9 % (ref 36.0–46.0)
Hemoglobin: 12.2 g/dL (ref 12.0–15.0)
Lymphocytes Relative: 28 % (ref 12–46)
Lymphs Abs: 1.3 10*3/uL (ref 0.7–4.0)
MCH: 27.4 pg (ref 26.0–34.0)
MCHC: 33.1 g/dL (ref 30.0–36.0)
MCV: 82.7 fL (ref 78.0–100.0)
Monocytes Absolute: 0.4 10*3/uL (ref 0.1–1.0)
Monocytes Relative: 8 % (ref 3–12)
Neutro Abs: 3 10*3/uL (ref 1.7–7.7)
Neutrophils Relative %: 63 % (ref 43–77)
Platelets: 208 10*3/uL (ref 150–400)
RBC: 4.46 MIL/uL (ref 3.87–5.11)
RDW: 14 % (ref 11.5–15.5)
WBC: 4.8 10*3/uL (ref 4.0–10.5)

## 2014-02-02 LAB — POCT URINALYSIS DIPSTICK
Bilirubin, UA: NEGATIVE
Glucose, UA: NEGATIVE
Ketones, UA: NEGATIVE
Nitrite, UA: NEGATIVE
Protein, UA: 30
Spec Grav, UA: 1.015
Urobilinogen, UA: 1
pH, UA: 6

## 2014-02-02 LAB — COMPLETE METABOLIC PANEL WITH GFR
ALT: 12 U/L (ref 0–35)
AST: 19 U/L (ref 0–37)
Albumin: 4.1 g/dL (ref 3.5–5.2)
Alkaline Phosphatase: 94 U/L (ref 39–117)
BUN: 8 mg/dL (ref 6–23)
CO2: 27 mEq/L (ref 19–32)
Calcium: 9.5 mg/dL (ref 8.4–10.5)
Chloride: 105 mEq/L (ref 96–112)
Creat: 0.77 mg/dL (ref 0.50–1.10)
GFR, Est African American: >89 mL/min
GFR, Est Non African American: 80 mL/min
Glucose, Bld: 89 mg/dL (ref 70–99)
Potassium: 4.2 mEq/L (ref 3.5–5.3)
Sodium: 138 mEq/L (ref 135–145)
Total Bilirubin: 0.7 mg/dL (ref 0.2–1.2)
Total Protein: 7.1 g/dL (ref 6.0–8.3)

## 2014-02-02 LAB — HEMOGLOBIN A1C
Hgb A1c MFr Bld: 5.6 % (ref <5.7)
Mean Plasma Glucose: 114 mg/dL (ref <117)

## 2014-02-02 LAB — LIPID PANEL
Cholesterol: 206 mg/dL — ABNORMAL HIGH (ref 0–200)
HDL: 59 mg/dL (ref >39)
LDL Cholesterol: 135 mg/dL — ABNORMAL HIGH (ref 0–99)
Total CHOL/HDL Ratio: 3.5 Ratio
Triglycerides: 62 mg/dL (ref <150)
VLDL: 12 mg/dL (ref 0–40)

## 2014-02-02 NOTE — Progress Notes (Addendum)
° °  Subjective:    Patient ID: Danielle Kirby, female    DOB: 01/04/1946, 68 y.o.   MRN: [id number] This chart was scribed for Elvina SidleKurt Lauenstein, MD by Littie Deedsichard Sun, Medical Scribe. This patient was seen in Room 25 and the patient's care was started at 10:14 AM.   HPI HPI Comments: Danielle Kirby is a 68 y.o. female who presents to the [facility] for a complete physical exam. Patient has been feeling a bit "under the weather" recently, but she thinks it may be due to the weather. She has had her Pneumovax vaccine, but has not had the Prevnar vaccine as of yet. She had some hip pain in the past, but she does not have the pain anymore. Patient has been walking 2 miles a day. She has been taking her supplements regularly. Her BP is currently measured at 134/74. She denies trouble hearing, trouble balancing, and digestive problems.   [facility] and she recently received new glasses. She does not use any eye drops. Patient was diagnosed with wet macular degeneration. She has hx of intraocular bleeding, which was treated with injection. She has had mammograms, but has not had any since she was about 68 years old; she has never had any negative mammograms or pap smears.  Patient reluctant to receive Prevnar. 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  Wt Readings from Last 3 Encounters:  02/02/14 191 lb 12.8 oz (87 kg)  09/04/13 192 lb 9.6 oz (87.363 kg)  07/07/13 191 lb 9.6 oz (86.909 kg)    Review of Systems  Constitutional: Negative for unexpected weight change.  HENT: Negative for dental problem and hearing loss.   Eyes: Negative for discharge.  Respiratory: Negative for cough.   Gastrointestinal: Negative for nausea, abdominal pain, diarrhea and constipation.  Genitourinary: Negative for hematuria.  Musculoskeletal: Negative for arthralgias and gait problem.    Skin: Negative for rash.  Neurological: Negative for speech difficulty.  Psychiatric/Behavioral: Negative for confusion.       Objective:   Physical Exam CONSTITUTIONAL: Well developed/well nourished HEAD: Normocephalic/atraumatic EYES: EOM/PERRL. Conjunctiva normal. ENMT: Mucous membranes moist NECK: supple no meningeal signs SPINE: entire spine nontender CV: S1/S2 noted, no murmurs/rubs/gallops noted. Great circulation. LUNGS: Lungs are clear to auscultation bilaterally, no apparent distress ABDOMEN: soft, nontender, no rebound or guarding GU: no cva tenderness NEURO: Pt is awake/alert, moves all extremitiesx4 EXTREMITIES: pulses normal. Slight decreased internal rotation of right hip. SKIN: warm, color normal PSYCH: no abnormalities of mood noted Breast exam:  Bilateral implants, no adenopathy or suspicious nodules.     Assessment & Plan:   Essential hypertension, benign - Plan: CBC with Differential, COMPLETE METABOLIC PANEL WITH GFR, POCT urinalysis dipstick, Hemoglobin A1c  Hyperlipidemia - Plan: Lipid panel, COMPLETE METABOLIC PANEL WITH GFR, Hemoglobin A1c  Osteoarthritis of left hip, unspecified osteoarthritis type - Plan: DG Bone Density  Vitamin D deficiency - Plan: Vit D  25 hydroxy (rtn osteoporosis monitoring)  Signed, Elvina SidleKurt Lauenstein, MD

## 2014-02-02 NOTE — Patient Instructions (Signed)

## 2014-02-03 ENCOUNTER — Ambulatory Visit (INDEPENDENT_AMBULATORY_CARE_PROVIDER_SITE_OTHER): Payer: Federal, State, Local not specified - PPO

## 2014-02-03 DIAGNOSIS — Z23 Encounter for immunization: Secondary | ICD-10-CM

## 2014-02-03 LAB — VITAMIN D 25 HYDROXY (VIT D DEFICIENCY, FRACTURES): Vit D, 25-Hydroxy: 32 ng/mL (ref 30–89)

## 2014-03-27 ENCOUNTER — Encounter: Payer: Self-pay | Admitting: Family Medicine

## 2014-07-25 ENCOUNTER — Ambulatory Visit (INDEPENDENT_AMBULATORY_CARE_PROVIDER_SITE_OTHER): Payer: Federal, State, Local not specified - PPO | Admitting: Family Medicine

## 2014-07-25 VITALS — BP 120/80 | HR 89 | Temp 98.6°F | Resp 20 | Ht 67.0 in

## 2014-07-25 DIAGNOSIS — R8281 Pyuria: Secondary | ICD-10-CM

## 2014-07-25 DIAGNOSIS — N390 Urinary tract infection, site not specified: Secondary | ICD-10-CM

## 2014-07-25 DIAGNOSIS — M5441 Lumbago with sciatica, right side: Secondary | ICD-10-CM

## 2014-07-25 LAB — POCT URINALYSIS DIPSTICK
Bilirubin, UA: NEGATIVE
Glucose, UA: NEGATIVE
Ketones, UA: NEGATIVE
Nitrite, UA: NEGATIVE
Protein, UA: 30
Spec Grav, UA: 1.02
Urobilinogen, UA: 1
pH, UA: 6

## 2014-07-25 LAB — POCT UA - MICROSCOPIC ONLY
Casts, Ur, LPF, POC: NEGATIVE
Crystals, Ur, HPF, POC: NEGATIVE
Mucus, UA: NEGATIVE
WBC, Ur, HPF, POC: NEGATIVE
Yeast, UA: NEGATIVE

## 2014-07-25 MED ORDER — HYDROCODONE-ACETAMINOPHEN 5-325 MG PO TABS: 1.0000 | ORAL_TABLET | Freq: Four times a day (QID) | ORAL | Status: DC | PRN

## 2014-07-25 MED ORDER — CIPROFLOXACIN HCL 250 MG PO TABS
250.0000 mg | ORAL_TABLET | Freq: Two times a day (BID) | ORAL | Status: DC
Start: 2014-07-25 — End: 2015-02-28

## 2014-07-25 MED ORDER — PREDNISONE 20 MG PO TABS: 40.0000 mg | ORAL_TABLET | Freq: Every day | ORAL | Status: DC

## 2014-07-25 NOTE — Progress Notes (Signed)
Chief Complaint  Patient presents with  . Back Pain    lower back pain all weekend with low grade fever at home and no appetite   Pcp: Elvina SidleLAUENSTEIN,Madeline Pho, MD Allergies  Allergen Reactions  . Penicillins     REACTION: hives   Patient Active Problem List   Diagnosis Date Noted  . Macular degeneration of both eyes 02/02/2014    Priority: Medium  . 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 She comes in with 2 days of low back pain.  Patient remembers moving furniture last week.  Patient has no urinary symptoms but is worried that perhaps her back pain is caused by her kidneys. She's had no fever, no dysuria, no polyuria.  Pain is associated with a lack of appetite and a very mild nausea feeling.   Objective:BP 120/80 mmHg  Pulse 89  Temp(Src) 98.6 F (37 C) (Oral)  Resp 20  Ht 5\' 7"  (1.702 m)  SpO2 96% Patient indicates that her pain is along the beltline in the posterior region of her back, bilaterally at about L5-S1. Skin: No rash Straight-leg raising is positive on the right Extermination no edema, pulses are normal, there is no rash There is no flank pain Abdomen is soft nontender  Results for orders placed or performed in visit on 07/25/14  POCT UA - Microscopic Only  Result Value Ref Range   WBC, Ur, HPF, POC neg    RBC, urine, microscopic 0-1    Bacteria, U Microscopic trace    Mucus, UA neg    Epithelial cells, urine per micros 0-1    Crystals, Ur, HPF, POC neg    Casts, Ur, LPF, POC neg    Yeast, UA neg   POCT urinalysis dipstick  Result Value Ref Range   Color, UA yellow    Clarity, UA clear    Glucose, UA neg    Bilirubin, UA neg    Ketones, UA neg    Spec Grav, UA 1.020    Blood, UA small    pH, UA 6.0    Protein, UA 30    Urobilinogen, UA 1.0    Nitrite, UA neg    Leukocytes, UA small (1+)    This chart was scribed in my presence and  reviewed by me personally.    ICD-9-CM ICD-10-CM   1. Bilateral low back pain with right-sided sciatica 724.3 M54.41 POCT UA - Microscopic Only     POCT urinalysis dipstick     Urine culture     predniSONE (DELTASONE) 20 MG tablet     HYDROcodone-acetaminophen (NORCO) 5-325 MG per tablet  2. Pyuria 791.9 N39.0 ciprofloxacin (CIPRO) 250 MG tablet     Signed, Elvina SidleKurt Diann Bangerter, MD

## 2014-07-25 NOTE — Patient Instructions (Signed)
Sciatica Sciatica is pain, weakness, numbness, or tingling along the path of the sciatic nerve. The nerve starts in the lower back and runs down the back of each leg. The nerve controls the muscles in the lower leg and in the back of the knee, while also providing sensation to the back of the thigh, lower leg, and the sole of your foot. Sciatica is a symptom of another medical condition. For instance, nerve damage or certain conditions, such as a herniated disk or bone spur on the spine, pinch or put pressure on the sciatic nerve. This causes the pain, weakness, or other sensations normally associated with sciatica. Generally, sciatica only affects one side of the body. CAUSES   Herniated or slipped disc.  Degenerative disk disease.  A pain disorder involving the narrow muscle in the buttocks (piriformis syndrome).  Pelvic injury or fracture.  Pregnancy.  Tumor (rare). SYMPTOMS  Symptoms can vary from mild to very severe. The symptoms usually travel from the low back to the buttocks and down the back of the leg. Symptoms can include:  Mild tingling or dull aches in the lower back, leg, or hip.  Numbness in the back of the calf or sole of the foot.  Burning sensations in the lower back, leg, or hip.  Sharp pains in the lower back, leg, or hip.  Leg weakness.  Severe back pain inhibiting movement. These symptoms may get worse with coughing, sneezing, laughing, or prolonged sitting or standing. Also, being overweight may worsen symptoms. DIAGNOSIS  Your caregiver will perform a physical exam to look for common symptoms of sciatica. He or she may ask you to do certain movements or activities that would trigger sciatic nerve pain. Other tests may be performed to find the cause of the sciatica. These may include:  Blood tests.  X-rays.  Imaging tests, such as an MRI or CT scan. TREATMENT  Treatment is directed at the cause of the sciatic pain. Sometimes, treatment is not necessary  and the pain and discomfort goes away on its own. If treatment is needed, your caregiver may suggest:  Over-the-counter medicines to relieve pain.  Prescription medicines, such as anti-inflammatory medicine, muscle relaxants, or narcotics.  Applying heat or ice to the painful area.  Steroid injections to lessen pain, irritation, and inflammation around the nerve.  Reducing activity during periods of pain.  Exercising and stretching to strengthen your abdomen and improve flexibility of your spine. Your caregiver may suggest losing weight if the extra weight makes the back pain worse.  Physical therapy.  Surgery to eliminate what is pressing or pinching the nerve, such as a bone spur or part of a herniated disk. HOME CARE INSTRUCTIONS   Only take over-the-counter or prescription medicines for pain or discomfort as directed by your caregiver.  Apply ice to the affected area for 20 minutes, 3-4 times a day for the first 48-72 hours. Then try heat in the same way.  Exercise, stretch, or perform your usual activities if these do not aggravate your pain.  Attend physical therapy sessions as directed by your caregiver.  Keep all follow-up appointments as directed by your caregiver.  Do not wear high heels or shoes that do not provide proper support.  Check your mattress to see if it is too soft. A firm mattress may lessen your pain and discomfort. SEEK IMMEDIATE MEDICAL CARE IF:   You lose control of your bowel or bladder (incontinence).  You have increasing weakness in the lower back, pelvis, buttocks,   or legs.  You have redness or swelling of your back.  You have a burning sensation when you urinate.  You have pain that gets worse when you lie down or awakens you at night.  Your pain is worse than you have experienced in the past.  Your pain is lasting longer than 4 weeks.  You are suddenly losing weight without reason. MAKE SURE YOU:  Understand these  instructions.  Will watch your condition.  Will get help right away if you are not doing well or get worse. Document Released: 03/18/2001 Document Revised: 09/23/2011 Document Reviewed: 08/03/2011 ExitCare Patient Information 2015 [employer]. This information is not intended to replace advice given to you by your health care provider. Make sure you discuss any questions you have with your health care provider.  

## 2014-07-26 LAB — URINE CULTURE: Colony Count: 30000

## 2014-07-27 ENCOUNTER — Ambulatory Visit: Payer: Federal, State, Local not specified - PPO | Admitting: Family Medicine

## 2014-08-24 ENCOUNTER — Encounter: Payer: Self-pay | Admitting: Gastroenterology

## 2014-09-13 ENCOUNTER — Encounter: Payer: Self-pay | Admitting: *Deleted

## 2015-02-28 ENCOUNTER — Ambulatory Visit (INDEPENDENT_AMBULATORY_CARE_PROVIDER_SITE_OTHER): Payer: Federal, State, Local not specified - PPO | Admitting: Family Medicine

## 2015-02-28 VITALS — BP 130/86 | HR 70 | Temp 97.8°F | Resp 18 | Ht 67.0 in | Wt 189.6 lb

## 2015-02-28 DIAGNOSIS — Z23 Encounter for immunization: Secondary | ICD-10-CM

## 2015-02-28 DIAGNOSIS — R030 Elevated blood-pressure reading, without diagnosis of hypertension: Secondary | ICD-10-CM | POA: Diagnosis not present

## 2015-02-28 DIAGNOSIS — IMO0001 Reserved for inherently not codable concepts without codable children: Secondary | ICD-10-CM

## 2015-02-28 NOTE — Progress Notes (Signed)
This chart was scribed for Danielle SidleKurt Juaquin Ludington, MD by Stann Oresung-Kai Tsai, medical scribe at [facility].The patient was seen in exam room 2 and the patient's care was started at 3:48 PM.  Patient ID: Danielle Kirby MRN: [id number], DOB: 02/03/1946, 69 y.o. Date of Encounter: 02/28/2015  Primary Physician: Danielle Kirby,Danielle Angus, MD  Chief Complaint:  Chief Complaint  Patient presents with  . Follow-up    BP check out    HPI:  Danielle Kirby is a 69 y.o. female who presents to [facility] to check her BP.  She used a blood pressure machine at the store and the results were 180/120. She freaked out and came in to check her BP. She is planning to go [place] to [place] on Friday with family.   She hasn't received a flu shot yet. She was recommended for a flu shot and she received one today.   Past Medical History  Diagnosis Date  . Allergy   . Arthritis      Home Meds: Prior to Admission medications   Medication Sig Start Date End Date Taking? Authorizing Provider  ciprofloxacin (CIPRO) 250 MG tablet Take 1 tablet (250 mg total) by mouth 2 (two) times daily. Patient not taking: Reported on 02/28/2015 07/25/14   Danielle SidleKurt Leahna Hewson, MD  HYDROcodone-acetaminophen [facility](NORCO) 5-325 MG per tablet Take 1 tablet by mouth every 6 (six) hours as needed for moderate pain. Patient not taking: Reported on 02/28/2015 07/25/14   Danielle SidleKurt Rufino Staup, MD  predniSONE (DELTASONE) 20 MG tablet Take 2 tablets (40 mg total) by mouth daily. Patient not taking: Reported on 02/28/2015 07/25/14   Danielle SidleKurt Kiandra Sanguinetti, MD    Allergies:  Allergies  Allergen Reactions  . Penicillins     REACTION: hives    Social History   Social History  . Marital Status: Single    Spouse Name: N/A  . Number of Children: N/A  . Years of Education: N/A   Occupational History  . Not on file.   Social History Main Topics  . Smoking status: Never Smoker   . Smokeless tobacco: Not on file  . Alcohol Use:  No  . Drug Use: No  . Sexual Activity: Yes    Birth Control/ Protection: None   Other Topics Concern  . Not on file   Social History Narrative     Review of Systems: Constitutional: negative for fever, chills, night sweats, weight changes, or fatigue  HEENT: negative for vision changes, hearing loss, congestion, rhinorrhea, ST, epistaxis, or sinus pressure Cardiovascular: negative for chest pain or palpitations Respiratory: negative for hemoptysis, wheezing, shortness of breath, or cough Abdominal: negative for abdominal pain, nausea, vomiting, diarrhea, or constipation Dermatological: negative for rash Neurologic: negative for headache, dizziness, or syncope All other systems reviewed and are otherwise negative with the exception to those above and in the HPI.  Physical Exam: Blood pressure 130/86, pulse 70, temperature 97.8 F (36.6 C), temperature source Oral, resp. rate 18, height 5\' 7"  (1.702 m), weight 189 lb 9.6 oz (86.002 kg), SpO2 97 %., Body mass index is 29.69 kg/(m^2). General: Well developed, well nourished, in no acute distress. Head: Normocephalic, atraumatic, eyes without discharge, sclera non-icteric, nares are without discharge. Bilateral auditory canals clear, TM's are without perforation, pearly grey and translucent with reflective cone of light bilaterally. Oral cavity moist, posterior pharynx without exudate, erythema, peritonsillar abscess, or post nasal drip.  Neck: Supple. No thyromegaly. Full ROM. No lymphadenopathy. Msk:  Strength and tone  normal for age. Extremities/Skin: Warm and dry. No clubbing or cyanosis. No edema. No rashes or suspicious lesions. Neuro: Alert and oriented X 3. Moves all extremities spontaneously. Gait is normal. CNII-XII grossly in tact. Psych:  Responds to questions appropriately with a normal affect.   BP recheck in room, sitting (left arm): 110/80  ASSESSMENT AND PLAN:  69 y.o. year old female with Needs flu shot - Plan: Flu  Vaccine QUAD 36+ mos IM  Elevated BP  Reassured about BP   By signing my name below, I, Stann Ore, attest that this documentation has been prepared under the direction and in the presence of Danielle Sidle, MD. Electronically Signed: Stann Ore, Scribe. 02/28/2015 , 3:48 PM .  Signed, Danielle Sidle, MD 02/28/2015 3:48 PM

## 2015-02-28 NOTE — Patient Instructions (Signed)

## 2015-05-12 ENCOUNTER — Ambulatory Visit (INDEPENDENT_AMBULATORY_CARE_PROVIDER_SITE_OTHER): Payer: Federal, State, Local not specified - PPO | Admitting: Family Medicine

## 2015-05-12 VITALS — BP 146/98 | HR 77 | Temp 98.0°F | Resp 18 | Ht 68.0 in | Wt 191.6 lb

## 2015-05-12 DIAGNOSIS — J0100 Acute maxillary sinusitis, unspecified: Secondary | ICD-10-CM | POA: Diagnosis not present

## 2015-05-12 MED ORDER — LEVOFLOXACIN 500 MG PO TABS: 500.0000 mg | ORAL_TABLET | Freq: Every day | ORAL | Status: DC

## 2015-05-12 NOTE — Progress Notes (Signed)
By signing my name below, I, Rawaa Al Rifaie, attest that this documentation has been prepared under the direction and in the presence of [name], MD.  Watt Climes Rifaie, Medical Scribe. 05/12/2015.  1:40 PM.  Patient ID: Danielle Kirby MRN: [id number], DOB: 03/15/1946, 70 y.o. Date of Encounter: 05/12/2015  Primary Physician: [name], MD  Chief Complaint:  Chief Complaint  Patient presents with  . Fever    Per pr. unspecified  . Generalized Body Aches  . Neck Pain  . Headache  . Chills    HPI:  Danielle Kirby is a 70 y.o. female who presents to [facility] complaining of possible sinusitis, gradual onset 1 weeks ago.  Pt reports having associated symptoms of subjective fever, myalgia, headache, neck pain, and chills. Pt is drinking large amounts of fluids. She denies coughs, wheezing, or recent travels. She used an herbal supplement for about a month, however she tripled the dosage of it and that caused her to experienced some symptoms. Pt is allergic to penicillin.   Pt's cousin has recently passed away from sickle cell.   Past Medical History  Diagnosis Date  . Allergy   . Arthritis      Home Meds: Prior to Admission medications   Not on File    Allergies:  Allergies  Allergen Reactions  . Penicillins     REACTION: hives    Social History   Social History  . Marital Status: Single    Spouse Name: N/A  . Number of Children: N/A  . Years of Education: N/A   Occupational History  . Not on file.   Social History Main Topics  . Smoking status: Never Smoker   . Smokeless tobacco: Not on file  . Alcohol Use: No  . Drug Use: No  . Sexual Activity: Yes    Birth Control/ Protection: None   Other Topics Concern  . Not on file   Social History Narrative     Review of Systems: Constitutional: negative for night sweats, weight changes, or fatigue. Positive for fever, chills.  HEENT: negative for vision changes, hearing  loss, congestion, rhinorrhea, ST, epistaxis, or sinus pressure Cardiovascular: negative for chest pain or palpitations Respiratory: negative for hemoptysis, wheezing, shortness of breath, or cough Abdominal: negative for abdominal pain, nausea, vomiting, diarrhea, or constipation Dermatological: negative for rash Neurologic: negative for dizziness, or syncope. Positive for headache. Msk: Positive for myalgia, neck pain.  All other systems reviewed and are otherwise negative with the exception to those above and in the HPI.  Physical Exam: Blood pressure 146/98, pulse 77, temperature 98 F (36.7 C), temperature source Oral, resp. rate 18, height  (1.727 m), weight 191 lb 9.6 oz (86.909 kg), SpO2 96 %., Body mass index is 29.14 kg/(m^2). General: Well developed, well nourished, in no acute distress. Head: Normocephalic, atraumatic, eyes without discharge, sclera non-icteric, nares are without discharge. Bilateral auditory canals clear, TM's are without perforation, pearly grey and translucent with reflective cone of light bilaterally. Oral cavity moist, posterior pharynx without exudate, erythema, peritonsillar abscess, or post nasal drip. small hemorrhagic area on the  Lateral wall of the right nasal passage  Neck: Supple. No thyromegaly. Full ROM. No lymphadenopathy. Lungs: Clear bilaterally to auscultation without wheezes, rales, or rhonchi. Breathing is unlabored. Heart: RRR with S1 S2. No murmurs, rubs, or gallops appreciated. Abdomen: Soft, non-tender, non-distended with normoactive bowel sounds. No hepatomegaly. No rebound/guarding. No obvious abdominal masses. Msk:  Strength and tone  normal for age. Extremities/Skin: Warm and dry. No clubbing or cyanosis. No edema. No rashes or suspicious lesions. Neuro: Alert and oriented X 3. Moves all extremities spontaneously. Gait is normal. CNII-XII grossly in tact. Psych:  Responds to questions appropriately with a normal affect.     ASSESSMENT AND PLAN:  70 y.o. year old female with sinuitis  This chart was scribed in my presence and reviewed by me personally.    ICD-9-CM ICD-10-CM   1. Acute maxillary sinusitis, recurrence not specified 461.0 J01.00 levofloxacin (LEVAQUIN) 500 MG tablet    Signed, [name], MD 05/12/2015 1:26 PM

## 2015-05-12 NOTE — Patient Instructions (Signed)
siSinusitis, Adult Sinusitis is redness, soreness, and inflammation of the paranasal sinuses. Paranasal sinuses are air pockets within the bones of your face. They are located beneath your eyes, in the middle of your forehead, and above your eyes. In healthy paranasal sinuses, mucus is able to drain out, and air is able to circulate through them by way of your nose. However, when your paranasal sinuses are inflamed, mucus and air can become trapped. This can allow bacteria and other germs to grow and cause infection. Sinusitis can develop quickly and last only a short time (acute) or continue over a long period (chronic). Sinusitis that lasts for more than 12 weeks is considered chronic. CAUSES Causes of sinusitis include:  Allergies.  Structural abnormalities, such as displacement of the cartilage that separates your nostrils (deviated septum), which can decrease the air flow through your nose and sinuses and affect sinus drainage.  Functional abnormalities, such as when the small hairs (cilia) that line your sinuses and help remove mucus do not work properly or are not present. SIGNS AND SYMPTOMS Symptoms of acute and chronic sinusitis are the same. The primary symptoms are pain and pressure around the affected sinuses. Other symptoms include:  Upper toothache.  Earache.  Headache.  Bad breath.  Decreased sense of smell and taste.  A cough, which worsens when you are lying flat.  Fatigue.  Fever.  Thick drainage from your nose, which often is green and may contain pus (purulent).  Swelling and warmth over the affected sinuses. DIAGNOSIS Your health care provider will perform a physical exam. During your exam, your health care provider may perform any of the following to help determine if you have acute sinusitis or chronic sinusitis:  Look in your nose for signs of abnormal growths in your nostrils (nasal polyps).  Tap over the affected sinus to check for signs of  infection.  View the inside of your sinuses using an imaging device that has a light attached (endoscope). If your health care provider suspects that you have chronic sinusitis, one or more of the following tests may be recommended:  Allergy tests.  Nasal culture. A sample of mucus is taken from your nose, sent to a lab, and screened for bacteria.  Nasal cytology. A sample of mucus is taken from your nose and examined by your health care provider to determine if your sinusitis is related to an allergy. TREATMENT Most cases of acute sinusitis are related to a viral infection and will resolve on their own within 10 days. Sometimes, medicines are prescribed to help relieve symptoms of both acute and chronic sinusitis. These may include pain medicines, decongestants, nasal steroid sprays, or saline sprays. However, for sinusitis related to a bacterial infection, your health care provider will prescribe antibiotic medicines. These are medicines that will help kill the bacteria causing the infection. Rarely, sinusitis is caused by a fungal infection. In these cases, your health care provider will prescribe antifungal medicine. For some cases of chronic sinusitis, surgery is needed. Generally, these are cases in which sinusitis recurs more than 3 times per year, despite other treatments. HOME CARE INSTRUCTIONS  Drink plenty of water. Water helps thin the mucus so your sinuses can drain more easily.  Use a humidifier.  Inhale steam 3-4 times a day (for example, sit in the bathroom with the shower running).  Apply a warm, moist washcloth to your face 3-4 times a day, or as directed by your health care provider.  Use saline nasal sprays to help  moisten and clean your sinuses.  Take medicines only as directed by your health care provider.  If you were prescribed either an antibiotic or antifungal medicine, finish it all even if you start to feel better. SEEK IMMEDIATE MEDICAL CARE IF:  You have  increasing pain or severe headaches.  You have nausea, vomiting, or drowsiness.  You have swelling around your face.  You have vision problems.  You have a stiff neck.  You have difficulty breathing.   This information is not intended to replace advice given to you by your health care provider. Make sure you discuss any questions you have with your health care provider.   Document Released: 03/24/2005 Document Revised: 04/14/2014 Document Reviewed: 04/08/2011 Elsevier Interactive Patient Education [employer].

## 2015-05-28 ENCOUNTER — Telehealth: Payer: Self-pay

## 2015-05-28 NOTE — Telephone Encounter (Signed)
Pt states she and Dr Kenyon Ana got to talking and forgot to address her BP, it was a little high and didn't know if she needed to come back in, also she had some hay fever medication that's a little out-dated And didn't know if she can still take it. Please call [phone] or her cell at [phone]

## 2015-05-29 NOTE — Telephone Encounter (Signed)
Actually, I mentioned that to her and felt that the slight elevation was probably a combination of OTC meds and the illness.  She should recheck the blood pressure when well at a local pharmacy to be sure.

## 2015-06-01 ENCOUNTER — Other Ambulatory Visit: Payer: Self-pay | Admitting: Family Medicine

## 2015-06-02 NOTE — Telephone Encounter (Signed)
Dr. Milus Glazier, I don't see were we have prescribed this before.  Do you want to fill?

## 2015-06-05 NOTE — Telephone Encounter (Signed)
Dr Milus Glazier Rxd this in 2015 for pt's allergies, and since he just saw her for sinusitis we can give RFs.

## 2015-06-05 NOTE — Telephone Encounter (Signed)
Called no answer

## 2015-07-26 ENCOUNTER — Ambulatory Visit (INDEPENDENT_AMBULATORY_CARE_PROVIDER_SITE_OTHER): Payer: Federal, State, Local not specified - PPO | Admitting: Family Medicine

## 2015-07-26 VITALS — BP 146/86 | HR 70 | Temp 97.6°F | Resp 16 | Ht 68.0 in | Wt 187.0 lb

## 2015-07-26 DIAGNOSIS — Z0000 Encounter for general adult medical examination without abnormal findings: Secondary | ICD-10-CM

## 2015-07-26 LAB — COMPLETE METABOLIC PANEL WITH GFR
ALT: 11 U/L (ref 6–29)
AST: 20 U/L (ref 10–35)
Albumin: 4.1 g/dL (ref 3.6–5.1)
Alkaline Phosphatase: 76 U/L (ref 33–130)
BUN: 9 mg/dL (ref 7–25)
CO2: 25 mmol/L (ref 20–31)
Calcium: 9.9 mg/dL (ref 8.6–10.4)
Chloride: 101 mmol/L (ref 98–110)
Creat: 0.69 mg/dL (ref 0.50–0.99)
GFR, Est African American: >89 mL/min (ref >=60)
GFR, Est Non African American: 89 mL/min (ref >=60)
Glucose, Bld: 82 mg/dL (ref 65–99)
Potassium: 4.1 mmol/L (ref 3.5–5.3)
Sodium: 136 mmol/L (ref 135–146)
Total Bilirubin: 0.5 mg/dL (ref 0.2–1.2)
Total Protein: 7.5 g/dL (ref 6.1–8.1)

## 2015-07-26 LAB — POCT CBC
Granulocyte percent: 39.8 %G (ref 37–80)
HCT, POC: 36 % — AB (ref 37.7–47.9)
Hemoglobin: 12.5 g/dL (ref 12.2–16.2)
Lymph, poc: 2 (ref 0.6–3.4)
MCH, POC: 28.7 pg (ref 27–31.2)
MCHC: 34.7 g/dL (ref 31.8–35.4)
MCV: 82.7 fL (ref 80–97)
MID (cbc): 0.3 (ref 0–0.9)
MPV: 8.5 fL (ref 0–99.8)
POC Granulocyte: 1.5 — AB (ref 2–6.9)
POC LYMPH PERCENT: 53.3 %L — AB (ref 10–50)
POC MID %: 6.9 %M (ref 0–12)
Platelet Count, POC: 154 10*3/uL (ref 142–424)
RBC: 4.36 M/uL (ref 4.04–5.48)
RDW, POC: 14.5 %
WBC: 3.7 10*3/uL — AB (ref 4.6–10.2)

## 2015-07-26 LAB — LIPID PANEL
Cholesterol: 210 mg/dL — ABNORMAL HIGH (ref 125–200)
HDL: 68 mg/dL (ref >=46)
LDL Cholesterol: 129 mg/dL (ref <130)
Total CHOL/HDL Ratio: 3.1 Ratio (ref <=5.0)
Triglycerides: 64 mg/dL (ref <150)
VLDL: 13 mg/dL (ref <30)

## 2015-07-26 LAB — TSH: TSH: 0.63 mIU/L

## 2015-07-26 NOTE — Patient Instructions (Addendum)
Health Maintenance, Female Adopting a healthy lifestyle and getting preventive care can go a long way to promote health and wellness. Talk with your health care provider about what schedule of regular examinations is right for you. This is a good chance for you to check in with your provider about disease prevention and staying healthy. In between checkups, there are plenty of things you can do on your own. Experts have done a lot of research about which lifestyle changes and preventive measures are most likely to keep you healthy. Ask your health care provider for more information. WEIGHT AND DIET  Eat a healthy diet  Be sure to include plenty of vegetables, fruits, low-fat dairy products, and lean protein.  Do not eat a lot of foods high in solid fats, added sugars, or salt.  Get regular exercise. This is one of the most important things you can do for your health.  Most adults should exercise for at least 150 minutes each week. The exercise should increase your heart rate and make you sweat (moderate-intensity exercise).  Most adults should also do strengthening exercises at least twice a week. This is in addition to the moderate-intensity exercise.  Maintain a healthy weight  Body mass index (BMI) is a measurement that can be used to identify possible weight problems. It estimates body fat based on height and weight. Your health care provider can help determine your BMI and help you achieve or maintain a healthy weight.  For females 20 years of age and older:   A BMI below 18.5 is considered underweight.  A BMI of 18.5 to 24.9 is normal.  A BMI of 25 to 29.9 is considered overweight.  A BMI of 30 and above is considered obese.  Watch levels of cholesterol and blood lipids  You should start having your blood tested for lipids and cholesterol at 70 years of age, then have this test every 5 years.  You may need to have your cholesterol levels checked more often if:  Your lipid  or cholesterol levels are high.  You are older than 70 years of age.  You are at high risk for heart disease.  CANCER SCREENING   Lung Cancer  Lung cancer screening is recommended for adults 55-80 years old who are at high risk for lung cancer because of a history of smoking.  A yearly low-dose CT scan of the lungs is recommended for people who:  Currently smoke.  Have quit within the past 15 years.  Have at least a 30-pack-year history of smoking. A pack year is smoking an average of one pack of cigarettes a day for 1 year.  Yearly screening should continue until it has been 15 years since you quit.  Yearly screening should stop if you develop a health problem that would prevent you from having lung cancer treatment.  Breast Cancer  Practice breast self-awareness. This means understanding how your breasts normally appear and feel.  It also means doing regular breast self-exams. Let your health care provider know about any changes, no matter how small.  If you are in your 20s or 30s, you should have a clinical breast exam (CBE) by a health care provider every 1-3 years as part of a regular health exam.  If you are 40 or older, have a CBE every year. Also consider having a breast X-ray (mammogram) every year.  If you have a family history of breast cancer, talk to your health care provider about genetic screening.  If you   are at high risk for breast cancer, talk to your health care provider about having an MRI and a mammogram every year.  Breast cancer gene (BRCA) assessment is recommended for women who have family members with BRCA-related cancers. BRCA-related cancers include:  Breast.  Ovarian.  Tubal.  Peritoneal cancers.  Results of the assessment will determine the need for genetic counseling and BRCA1 and BRCA2 testing. Cervical Cancer Your health care provider may recommend that you be screened regularly for cancer of the pelvic organs (ovaries, uterus, and  vagina). This screening involves a pelvic examination, including checking for microscopic changes to the surface of your cervix (Pap test). You may be encouraged to have this screening done every 3 years, beginning at age 21.  For women ages 30-65, health care providers may recommend pelvic exams and Pap testing every 3 years, or they may recommend the Pap and pelvic exam, combined with testing for human papilloma virus (HPV), every 5 years. Some types of HPV increase your risk of cervical cancer. Testing for HPV may also be done on women of any age with unclear Pap test results.  Other health care providers may not recommend any screening for nonpregnant women who are considered low risk for pelvic cancer and who do not have symptoms. Ask your health care provider if a screening pelvic exam is right for you.  If you have had past treatment for cervical cancer or a condition that could lead to cancer, you need Pap tests and screening for cancer for at least 20 years after your treatment. If Pap tests have been discontinued, your risk factors (such as having a new sexual partner) need to be reassessed to determine if screening should resume. Some women have medical problems that increase the chance of getting cervical cancer. In these cases, your health care provider may recommend more frequent screening and Pap tests. Colorectal Cancer  This type of cancer can be detected and often prevented.  Routine colorectal cancer screening usually begins at 70 years of age and continues through 70 years of age.  Your health care provider may recommend screening at an earlier age if you have risk factors for colon cancer.  Your health care provider may also recommend using home test kits to check for hidden blood in the stool.  A small camera at the end of a tube can be used to examine your colon directly (sigmoidoscopy or colonoscopy). This is done to check for the earliest forms of colorectal  cancer.  Routine screening usually begins at age 50.  Direct examination of the colon should be repeated every 5-10 years through 70 years of age. However, you may need to be screened more often if early forms of precancerous polyps or small growths are found. Skin Cancer  Check your skin from head to toe regularly.  Tell your health care provider about any new moles or changes in moles, especially if there is a change in a mole's shape or color.  Also tell your health care provider if you have a mole that is larger than the size of a pencil eraser.  Always use sunscreen. Apply sunscreen liberally and repeatedly throughout the day.  Protect yourself by wearing long sleeves, pants, a wide-brimmed hat, and sunglasses whenever you are outside. HEART DISEASE, DIABETES, AND HIGH BLOOD PRESSURE   High blood pressure causes heart disease and increases the risk of stroke. High blood pressure is more likely to develop in:  People who have blood pressure in the high end   of the normal range (130-139/85-89 mm Hg).  People who are overweight or obese.  People who are African American.  If you are 38-23 years of age, have your blood pressure checked every 3-5 years. If you are 61 years of age or older, have your blood pressure checked every year. You should have your blood pressure measured twice--once when you are at a hospital or clinic, and once when you are not at a hospital or clinic. Record the average of the two measurements. To check your blood pressure when you are not at a hospital or clinic, you can use:  An automated blood pressure machine at a pharmacy.  A home blood pressure monitor.  If you are between 45 years and 39 years old, ask your health care provider if you should take aspirin to prevent strokes.  Have regular diabetes screenings. This involves taking a blood sample to check your fasting blood sugar level.  If you are at a normal weight and have a low risk for diabetes,  have this test once every three years after 70 years of age.  If you are overweight and have a high risk for diabetes, consider being tested at a younger age or more often. PREVENTING INFECTION  Hepatitis B  If you have a higher risk for hepatitis B, you should be screened for this virus. You are considered at high risk for hepatitis B if:  You were born in a country where hepatitis B is common. Ask your health care provider which countries are considered high risk.  Your parents were born in a high-risk country, and you have not been immunized against hepatitis B (hepatitis B vaccine).  You have HIV or AIDS.  You use needles to inject street drugs.  You live with someone who has hepatitis B.  You have had sex with someone who has hepatitis B.  You get hemodialysis treatment.  You take certain medicines for conditions, including cancer, organ transplantation, and autoimmune conditions. Hepatitis C  Blood testing is recommended for:  Everyone born from [birth year] through 1965.  Anyone with known risk factors for hepatitis C. Sexually transmitted infections (STIs)  You should be screened for sexually transmitted infections (STIs) including gonorrhea and chlamydia if:  You are sexually active and are younger than 70 years of age.  You are older than 70 years of age and your health care provider tells you that you are at risk for this type of infection.  Your sexual activity has changed since you were last screened and you are at an increased risk for chlamydia or gonorrhea. Ask your health care provider if you are at risk.  If you do not have HIV, but are at risk, it may be recommended that you take a prescription medicine daily to prevent HIV infection. This is called pre-exposure prophylaxis (PrEP). You are considered at risk if:  You are sexually active and do not regularly use condoms or know the HIV status of your partner(s).  You take drugs by injection.  You are sexually  active with a partner who has HIV. Talk with your health care provider about whether you are at high risk of being infected with HIV. If you choose to begin PrEP, you should first be tested for HIV. You should then be tested every 3 months for as long as you are taking PrEP.  PREGNANCY   If you are premenopausal and you may become pregnant, ask your health care provider about preconception counseling.  If you may  become pregnant, take 400 to 800 micrograms (mcg) of folic acid every day.  If you want to prevent pregnancy, talk to your health care provider about birth control (contraception). OSTEOPOROSIS AND MENOPAUSE   Osteoporosis is a disease in which the bones lose minerals and strength with aging. This can result in serious bone fractures. Your risk for osteoporosis can be identified using a bone density scan.  If you are 65 years of age or older, or if you are at risk for osteoporosis and fractures, ask your health care provider if you should be screened.  Ask your health care provider whether you should take a calcium or vitamin D supplement to lower your risk for osteoporosis.  Menopause may have certain physical symptoms and risks.  Hormone replacement therapy may reduce some of these symptoms and risks. Talk to your health care provider about whether hormone replacement therapy is right for you.  HOME CARE INSTRUCTIONS   Schedule regular health, dental, and eye exams.  Stay current with your immunizations.   Do not use any tobacco products including cigarettes, chewing tobacco, or electronic cigarettes.  If you are pregnant, do not drink alcohol.  If you are breastfeeding, limit how much and how often you drink alcohol.  Limit alcohol intake to no more than 1 drink per day for nonpregnant women. One drink equals 12 ounces of beer, 5 ounces of wine, or 1 ounces of hard liquor.  Do not use street drugs.  Do not share needles.  Ask your health care provider for help if  you need support or information about quitting drugs.  Tell your health care provider if you often feel depressed.  Tell your health care provider if you have ever been abused or do not feel safe at home.   This information is not intended to replace advice given to you by your health care provider. Make sure you discuss any questions you have with your health care provider.   Document Released: 10/07/2010 Document Revised: 04/14/2014 Document Reviewed: 02/23/2013 Elsevier Interactive Patient Education 2016 [employer]  

## 2015-07-26 NOTE — Progress Notes (Signed)
Patient ID: Danielle Kirby MRN: [id number], DOB: 02-Nov-1945, 70 y.o. Date of Encounter: 07/26/2015, 2:03 PM  Primary Physician: [name], MD  Chief Complaint: Physical (CPE)  HPI: 70 y.o. y/o female with history of noted below here for CPE.  Doing well. No issues/complaints.  Review of Systems: Consitutional: No fever, chills, fatigue, night sweats, lymphadenopathy, or weight changes. Eyes: No visual changes, eye redness, or discharge. ENT/Mouth: Ears: No otalgia, tinnitus, hearing loss, discharge. Nose: No congestion, rhinorrhea, sinus pain, or epistaxis. Throat: No sore throat, post nasal drip, or teeth pain. Cardiovascular: No CP, palpitations, diaphoresis, DOE, edema, orthopnea, PND. Respiratory: No cough, hemoptysis, SOB, or wheezing. Gastrointestinal: No anorexia, dysphagia, reflux, pain, nausea, vomiting, hematemesis, diarrhea, constipation, BRBPR, or melena. Breast: No discharge, pain, swelling, or mass. Genitourinary: No dysuria, frequency, urgency, hematuria, incontinence, nocturia, amenorrhea, vaginal discharge, pruritis, burning, abnormal bleeding, or pain. Musculoskeletal: No decreased ROM, myalgias, stiffness, joint swelling, or weakness. Skin: No rash, erythema, lesion changes, pain, warmth, jaundice, or pruritis. Neurological: No headache, dizziness, syncope, seizures, tremors, memory loss, coordination problems, or paresthesias. Psychological: No anxiety, depression, hallucinations, SI/HI. Endocrine: No fatigue, polydipsia, polyphagia, polyuria, or known diabetes. All other systems were reviewed and are otherwise negative.  Past Medical History  Diagnosis Date  . Allergy   . Arthritis      Past Surgical History  Procedure Laterality Date  . Breast surgery    . Cosmetic surgery    . Tubal ligation      Home Meds:  Prior to Admission medications   Medication Sig Start Date End Date Taking? Authorizing Provider  fexofenadine (ALLEGRA) 180 MG tablet  TAKE 1 TABLET (180 MG TOTAL) BY MOUTH DAILY. Patient not taking: Reported on 07/26/2015 06/05/15   [name], MD  levofloxacin (LEVAQUIN) 500 MG tablet Take 1 tablet (500 mg total) by mouth daily. Patient not taking: Reported on 07/26/2015 05/12/15   [name], MD    Allergies:  Allergies  Allergen Reactions  . Penicillins     REACTION: hives    Social History   Social History  . Marital Status: Single    Spouse Name: N/A  . Number of Children: N/A  . Years of Education: N/A   Occupational History  . Not on file.   Social History Main Topics  . Smoking status: Never Smoker   . Smokeless tobacco: Not on file  . Alcohol Use: No  . Drug Use: No  . Sexual Activity: Yes    Birth Control/ Protection: None   Other Topics Concern  . Not on file   Social History Narrative    History reviewed. No pertinent family history.  Physical Exam: Blood pressure 146/86, pulse 70, temperature 97.6 F (36.4 C), temperature source Oral, resp. rate 16, height  (1.727 m), weight 187 lb (84.823 kg), SpO2 99 %., Body mass index is 28.44 kg/(m^2). Wt Readings from Last 3 Encounters:  07/26/15 187 lb (84.823 kg)  05/12/15 191 lb 9.6 oz (86.909 kg)  02/28/15 189 lb 9.6 oz (86.002 kg)   BP Readings from Last 3 Encounters:  07/26/15 146/86  05/12/15 146/98  02/28/15 130/86   General: Well developed, well nourished, in no acute distress. HEENT: Normocephalic, atraumatic. Conjunctiva pink, sclera non-icteric. Pupils 2 mm constricting to 1 mm, round, regular, and equally reactive to light and accomodation. EOMI. Fundi benign   Internal auditory canal clear. TMs with good cone of light and without pathology. Nasal mucosa pink. Nares are without discharge. No sinus tenderness. Oral mucosa  pink. Dentition excellent. Pharynx without exudate.    Neck: Supple. Trachea midline. No thyromegaly. Full ROM. No lymphadenopathy. Lungs: Clear to auscultation bilaterally without wheezes, rales,  or rhonchi. Breathing is of normal effort and unlabored. Cardiovascular: RRR with S1 S2. No murmurs, rubs, or gallops appreciated. Distal pulses 2+ symmetrically. No carotid or abdominal bruits. Abdomen: Soft, non-tender, non-distended with normoactive bowel sounds. No hepatosplenomegaly or masses. No rebound/guarding. No CVA tenderness. Without hernias. Musculoskeletal: Full range of motion and 5/5 strength throughout. Without swelling, atrophy, tenderness, crepitus, or warmth. Extremities without clubbing, cyanosis, or edema. Calves supple. Skin: Warm and moist without erythema, ecchymosis, wounds, or rash. Neuro: A+Ox3. CN II-XII grossly intact. Moves all extremities spontaneously. Full sensation throughout. Normal gait. DTR 2+ throughout upper and lower extremities. Finger to nose intact. Psych:  Responds to questions appropriately with a normal affect.   Lab Results  Component Value Date   CHOL 206* 02/02/2014   HDL 59 02/02/2014   LDLCALC 135* 02/02/2014   TRIG 62 02/02/2014   CHOLHDL 3.5 02/02/2014    Assessment/Plan:  70 y.o. y/o female here for CPE   ICD-9-CM ICD-10-CM   1. Annual physical exam V70.0 Z00.00 POCT CBC     POCT urinalysis dipstick     COMPLETE METABOLIC PANEL WITH GFR     Lipid panel     TSH     Hepatitis C antibody   -  Signed, Elvina SidleKurt Gianny Killman, MD 07/26/2015 2:03 PM

## 2015-07-27 LAB — HEPATITIS C ANTIBODY: HCV Ab: NEGATIVE

## 2016-05-31 ENCOUNTER — Encounter: Payer: Self-pay | Admitting: Family Medicine

## 2016-05-31 ENCOUNTER — Ambulatory Visit (INDEPENDENT_AMBULATORY_CARE_PROVIDER_SITE_OTHER): Payer: Federal, State, Local not specified - PPO | Admitting: Family Medicine

## 2016-05-31 VITALS — BP 126/78 | HR 71 | Temp 98.7°F | Resp 16 | Ht 67.0 in | Wt 194.0 lb

## 2016-05-31 DIAGNOSIS — Z23 Encounter for immunization: Secondary | ICD-10-CM

## 2016-05-31 DIAGNOSIS — Z0000 Encounter for general adult medical examination without abnormal findings: Secondary | ICD-10-CM

## 2016-05-31 NOTE — Patient Instructions (Addendum)
It was good to meet you today.  Things look like they're going well.  We will get you the results of your labs next week.    Let us know if you have questions or need anything.  Have a good rest of the weekend.  Health Maintenance, Female Introduction Adopting a healthy lifestyle and getting preventive care can go a long way to promote health and wellness. Talk with your health care provider about what schedule of regular examinations is right for you. This is a good chance for you to check in with your provider about disease prevention and staying healthy. In between checkups, there are plenty of things you can do on your own. Experts have done a lot of research about which lifestyle changes and preventive measures are most likely to keep you healthy. Ask your health care provider for more information. Weight and diet Eat a healthy diet  Be sure to include plenty of vegetables, fruits, low-fat dairy products, and lean protein.  Do not eat a lot of foods high in solid fats, added sugars, or salt.  Get regular exercise. This is one of the most important things you can do for your health.  Most adults should exercise for at least 150 minutes each week. The exercise should increase your heart rate and make you sweat (moderate-intensity exercise).  Most adults should also do strengthening exercises at least twice a week. This is in addition to the moderate-intensity exercise. Maintain a healthy weight  Body mass index (BMI) is a measurement that can be used to identify possible weight problems. It estimates body fat based on height and weight. Your health care provider can help determine your BMI and help you achieve or maintain a healthy weight.  For females 50 years of age and older:  A BMI below 18.5 is considered underweight.  A BMI of 18.5 to 24.9 is normal.  A BMI of 25 to 29.9 is considered overweight.  A BMI of 30 and above is considered obese. Watch levels of cholesterol  and blood lipids  You should start having your blood tested for lipids and cholesterol at 71 years of age, then have this test every 5 years.  You may need to have your cholesterol levels checked more often if:  Your lipid or cholesterol levels are high.  You are older than 71 years of age.  You are at high risk for heart disease. Cancer screening Lung Cancer  Lung cancer screening is recommended for adults 19-55 years old who are at high risk for lung cancer because of a history of smoking.  A yearly low-dose CT scan of the lungs is recommended for people who:  Currently smoke.  Have quit within the past 15 years.  Have at least a 30-pack-year history of smoking. A pack year is smoking an average of one pack of cigarettes a day for 1 year.  Yearly screening should continue until it has been 15 years since you quit.  Yearly screening should stop if you develop a health problem that would prevent you from having lung cancer treatment. Breast Cancer  Practice breast self-awareness. This means understanding how your breasts normally appear and feel.  It also means doing regular breast self-exams. Let your health care provider know about any changes, no matter how small.  If you are in your 20s or 30s, you should have a clinical breast exam (CBE) by a health care provider every 1-3 years as part of a regular health exam.  If  you are 40 or older, have a CBE every year. Also consider having a breast X-ray (mammogram) every year.  If you have a family history of breast cancer, talk to your health care provider about genetic screening.  If you are at high risk for breast cancer, talk to your health care provider about having an MRI and a mammogram every year.  Breast cancer gene (BRCA) assessment is recommended for women who have family members with BRCA-related cancers. BRCA-related cancers include:  Breast.  Ovarian.  Tubal.  Peritoneal cancers.  Results of the assessment  will determine the need for genetic counseling and BRCA1 and BRCA2 testing. Cervical Cancer  Your health care provider may recommend that you be screened regularly for cancer of the pelvic organs (ovaries, uterus, and vagina). This screening involves a pelvic examination, including checking for microscopic changes to the surface of your cervix (Pap test). You may be encouraged to have this screening done every 3 years, beginning at age 83.  For women ages 38-65, health care providers may recommend pelvic exams and Pap testing every 3 years, or they may recommend the Pap and pelvic exam, combined with testing for human papilloma virus (HPV), every 5 years. Some types of HPV increase your risk of cervical cancer. Testing for HPV may also be done on women of any age with unclear Pap test results.  Other health care providers may not recommend any screening for nonpregnant women who are considered low risk for pelvic cancer and who do not have symptoms. Ask your health care provider if a screening pelvic exam is right for you.  If you have had past treatment for cervical cancer or a condition that could lead to cancer, you need Pap tests and screening for cancer for at least 20 years after your treatment. If Pap tests have been discontinued, your risk factors (such as having a new sexual partner) need to be reassessed to determine if screening should resume. Some women have medical problems that increase the chance of getting cervical cancer. In these cases, your health care provider may recommend more frequent screening and Pap tests. Colorectal Cancer  This type of cancer can be detected and often prevented.  Routine colorectal cancer screening usually begins at 71 years of age and continues through 71 years of age.  Your health care provider may recommend screening at an earlier age if you have risk factors for colon cancer.  Your health care provider may also recommend using home test kits to check  for hidden blood in the stool.  A small camera at the end of a tube can be used to examine your colon directly (sigmoidoscopy or colonoscopy). This is done to check for the earliest forms of colorectal cancer.  Routine screening usually begins at age 59.  Direct examination of the colon should be repeated every 5-10 years through 71 years of age. However, you may need to be screened more often if early forms of precancerous polyps or small growths are found. Skin Cancer  Check your skin from head to toe regularly.  Tell your health care provider about any new moles or changes in moles, especially if there is a change in a mole's shape or color.  Also tell your health care provider if you have a mole that is larger than the size of a pencil eraser.  Always use sunscreen. Apply sunscreen liberally and repeatedly throughout the day.  Protect yourself by wearing long sleeves, pants, a wide-brimmed hat, and sunglasses whenever you  are outside. Heart disease, diabetes, and high blood pressure  High blood pressure causes heart disease and increases the risk of stroke. High blood pressure is more likely to develop in:  People who have blood pressure in the high end of the normal range (130-139/85-89 mm Hg).  People who are overweight or obese.  People who are African American.  If you are 23-68 years of age, have your blood pressure checked every 3-5 years. If you are 92 years of age or older, have your blood pressure checked every year. You should have your blood pressure measured twice-once when you are at a hospital or clinic, and once when you are not at a hospital or clinic. Record the average of the two measurements. To check your blood pressure when you are not at a hospital or clinic, you can use:  An automated blood pressure machine at a pharmacy.  A home blood pressure monitor.  If you are between 73 years and 64 years old, ask your health care provider if you should take aspirin  to prevent strokes.  Have regular diabetes screenings. This involves taking a blood sample to check your fasting blood sugar level.  If you are at a normal weight and have a low risk for diabetes, have this test once every three years after 71 years of age.  If you are overweight and have a high risk for diabetes, consider being tested at a younger age or more often. Preventing infection Hepatitis B  If you have a higher risk for hepatitis B, you should be screened for this virus. You are considered at high risk for hepatitis B if:  You were born in a country where hepatitis B is common. Ask your health care provider which countries are considered high risk.  Your parents were born in a high-risk country, and you have not been immunized against hepatitis B (hepatitis B vaccine).  You have HIV or AIDS.  You use needles to inject street drugs.  You live with someone who has hepatitis B.  You have had sex with someone who has hepatitis B.  You get hemodialysis treatment.  You take certain medicines for conditions, including cancer, organ transplantation, and autoimmune conditions. Hepatitis C  Blood testing is recommended for:  Everyone born from [birth year] through 1965.  Anyone with known risk factors for hepatitis C. Sexually transmitted infections (STIs)  You should be screened for sexually transmitted infections (STIs) including gonorrhea and chlamydia if:  You are sexually active and are younger than 71 years of age.  You are older than 71 years of age and your health care provider tells you that you are at risk for this type of infection.  Your sexual activity has changed since you were last screened and you are at an increased risk for chlamydia or gonorrhea. Ask your health care provider if you are at risk.  If you do not have HIV, but are at risk, it may be recommended that you take a prescription medicine daily to prevent HIV infection. This is called pre-exposure  prophylaxis (PrEP). You are considered at risk if:  You are sexually active and do not regularly use condoms or know the HIV status of your partner(s).  You take drugs by injection.  You are sexually active with a partner who has HIV. Talk with your health care provider about whether you are at high risk of being infected with HIV. If you choose to begin PrEP, you should first be tested for HIV. You  should then be tested every 3 months for as long as you are taking PrEP. Pregnancy  If you are premenopausal and you may become pregnant, ask your health care provider about preconception counseling.  If you may become pregnant, take 400 to 800 micrograms (mcg) of folic acid every day.  If you want to prevent pregnancy, talk to your health care provider about birth control (contraception). Osteoporosis and menopause  Osteoporosis is a disease in which the bones lose minerals and strength with aging. This can result in serious bone fractures. Your risk for osteoporosis can be identified using a bone density scan.  If you are 81 years of age or older, or if you are at risk for osteoporosis and fractures, ask your health care provider if you should be screened.  Ask your health care provider whether you should take a calcium or vitamin D supplement to lower your risk for osteoporosis.  Menopause may have certain physical symptoms and risks.  Hormone replacement therapy may reduce some of these symptoms and risks. Talk to your health care provider about whether hormone replacement therapy is right for you. Follow these instructions at home:  Schedule regular health, dental, and eye exams.  Stay current with your immunizations.  Do not use any tobacco products including cigarettes, chewing tobacco, or electronic cigarettes.  If you are pregnant, do not drink alcohol.  If you are breastfeeding, limit how much and how often you drink alcohol.  Limit alcohol intake to no more than 1 drink  per day for nonpregnant women. One drink equals 12 ounces of beer, 5 ounces of wine, or 1 ounces of hard liquor.  Do not use street drugs.  Do not share needles.  Ask your health care provider for help if you need support or information about quitting drugs.  Tell your health care provider if you often feel depressed.  Tell your health care provider if you have ever been abused or do not feel safe at home. This information is not intended to replace advice given to you by your health care provider. Make sure you discuss any questions you have with your health care provider. Document Released: 10/07/2010 Document Revised: 08/30/2015 Document Reviewed: 12/26/2014  2017 Elsevier    IF you received an x-ray today, you will receive an invoice from [facility]. Please contact [facility] at [phone] with questions or concerns regarding your invoice.   IF you received labwork today, you will receive an invoice from [facility]. Please contact [facility] at [phone] with questions or concerns regarding your invoice.   Our billing staff will not be able to assist you with questions regarding bills from these companies.  You will be contacted with the lab results as soon as they are available. The fastest way to get your results is to activate your My Chart account. Instructions are located on the last page of this paperwork. If you have not heard from [place] regarding the results in 2 weeks, please contact this office.

## 2016-05-31 NOTE — Progress Notes (Signed)
   Danielle Kirby is a 71 y.o. female who presents to [facility] today for comprehensive physical examination:  CPE  Concerns:  None  Last physical last year.  Gets them yearly  Tetanus w/in past 10 years Flu vaccine today Eye exam:  Monthly.  Has history of cataracts and macular degeneration.  Dental exam every six months.   PMH reviewed. Patient is a nonsmoker.   Past Medical History:  Diagnosis Date  . Allergy   . Arthritis   . Cataract   . Heart murmur    Past Surgical History:  Procedure Laterality Date  . BREAST SURGERY    . COSMETIC SURGERY    . EYE SURGERY    . TUBAL LIGATION      Medications reviewed. No current outpatient prescriptions on file.   No current facility-administered medications for this visit.     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  HENT: Negative for congestion, ear discharge, ear pain and hearing loss.   Eyes: Does have occasional blurry vision with cataracts Respiratory: Negative for cough and wheezing.   Cardiovascular: Negative for chest pain, palpitations and leg swelling.  Gastrointestinal: Negative for nausea, vomiting and abdominal pain.  Genitourinary: Negative for dysuria, hematuria and flank pain.  Musculoskeletal: Negative for neck pain.  Skin: Negative for rash.  Neurological: Negative for dizziness and headaches.  Psychiatric/Behavioral: Negative for depression and suicidal ideas.    Exam: BP 126/78   Pulse 71   Temp 98.7 F (37.1 C)   Resp 16   Ht 5\' 7"  (1.702 m)   Wt 194 lb (88 kg)   SpO2 98%   BMI 30.38 kg/m  Gen:  Alert, cooperative patient who appears stated age in no acute distress.  Vital signs reviewed. Head: [place]/AT.   Eyes:  EOMI, PERRL.  Wears  glasses Ears:  External ears WNL, Bilateral TM's normal without retraction, redness or bulging. Nose:  Septum midline  Mouth:  MMM, tonsils non-erythematous, non-edematous.   Neck: No masses or thyromegaly or limitation in range of motion.  No cervical lymphadenopathy. Pulm:  Clear to auscultation bilaterally with good air movement.  No wheezes or rales noted.   Cardiac:  Regular rate and rhythm without murmur auscultated.  Good S1/S2. Abd:  Soft/nondistended/nontender.  Good bowel sounds throughout all four quadrants.  No masses noted.  Ext:  No clubbing/cyanosis/erythema.  No edema noted bilateral lower extremities.   Neuro:  Grossly normal, no gait abnormalities Psych:  Not depressed or anxious appearing.  Conversant and engaged  Impression/Plan: 1. Complete Physical Examination: anticipatory guidance provided. S/p flu today.   3.  Vaccines:  Flu today 4.  Screening cholesterol: obtain FLP, yearly labs.  - otherwise doing well.  [facility]

## 2016-06-01 LAB — LIPID PANEL
Chol/HDL Ratio: 3.1 ratio units (ref 0.0–4.4)
Cholesterol, Total: 217 mg/dL — ABNORMAL HIGH (ref 100–199)
HDL: 69 mg/dL (ref >39)
LDL Calculated: 135 mg/dL — ABNORMAL HIGH (ref 0–99)
Triglycerides: 67 mg/dL (ref 0–149)
VLDL Cholesterol Cal: 13 mg/dL (ref 5–40)

## 2016-06-01 LAB — CBC
Hematocrit: 37.9 % (ref 34.0–46.6)
Hemoglobin: 12.1 g/dL (ref 11.1–15.9)
MCH: 28 pg (ref 26.6–33.0)
MCHC: 31.9 g/dL (ref 31.5–35.7)
MCV: 88 fL (ref 79–97)
Platelets: 194 10*3/uL (ref 150–379)
RBC: 4.32 x10E6/uL (ref 3.77–5.28)
RDW: 14.4 % (ref 12.3–15.4)
WBC: 3 10*3/uL — ABNORMAL LOW (ref 3.4–10.8)

## 2016-06-01 LAB — COMPREHENSIVE METABOLIC PANEL
ALT: 13 IU/L (ref 0–32)
AST: 19 IU/L (ref 0–40)
Albumin/Globulin Ratio: 1.5 (ref 1.2–2.2)
Albumin: 4.3 g/dL (ref 3.5–4.8)
Alkaline Phosphatase: 93 IU/L (ref 39–117)
BUN/Creatinine Ratio: 19 (ref 12–28)
BUN: 13 mg/dL (ref 8–27)
Bilirubin Total: 0.2 mg/dL (ref 0.0–1.2)
CO2: 22 mmol/L (ref 18–29)
Calcium: 9.5 mg/dL (ref 8.7–10.3)
Chloride: 103 mmol/L (ref 96–106)
Creatinine, Ser: 0.69 mg/dL (ref 0.57–1.00)
GFR calc Af Amer: 102 mL/min/{1.73_m2} (ref >59)
GFR calc non Af Amer: 88 mL/min/{1.73_m2} (ref >59)
Globulin, Total: 2.8 g/dL (ref 1.5–4.5)
Glucose: 89 mg/dL (ref 65–99)
Potassium: 4.1 mmol/L (ref 3.5–5.2)
Sodium: 142 mmol/L (ref 134–144)
Total Protein: 7.1 g/dL (ref 6.0–8.5)

## 2016-06-01 LAB — TSH: TSH: 1.25 u[IU]/mL (ref 0.450–4.500)

## 2017-07-31 ENCOUNTER — Encounter: Payer: Self-pay | Admitting: Emergency Medicine

## 2017-07-31 ENCOUNTER — Other Ambulatory Visit: Payer: Self-pay

## 2017-07-31 ENCOUNTER — Ambulatory Visit (INDEPENDENT_AMBULATORY_CARE_PROVIDER_SITE_OTHER): Payer: Federal, State, Local not specified - PPO

## 2017-07-31 ENCOUNTER — Ambulatory Visit (HOSPITAL_COMMUNITY)
Admission: RE | Admit: 2017-07-31 | Discharge: 2017-07-31 | Disposition: A | Discharge status: Home / Self Care | Payer: Federal, State, Local not specified - PPO | Source: Ambulatory Visit | Attending: Vascular Surgery | Admitting: Vascular Surgery

## 2017-07-31 ENCOUNTER — Ambulatory Visit (INDEPENDENT_AMBULATORY_CARE_PROVIDER_SITE_OTHER): Payer: Federal, State, Local not specified - PPO | Admitting: Emergency Medicine

## 2017-07-31 ENCOUNTER — Telehealth: Payer: Self-pay | Admitting: *Deleted

## 2017-07-31 VITALS — BP 120/100 | HR 75 | Temp 98.0°F | Resp 16 | Ht 67.0 in | Wt 190.0 lb

## 2017-07-31 DIAGNOSIS — M79605 Pain in left leg: Secondary | ICD-10-CM

## 2017-07-31 DIAGNOSIS — Z8739 Personal history of other diseases of the musculoskeletal system and connective tissue: Secondary | ICD-10-CM | POA: Diagnosis not present

## 2017-07-31 DIAGNOSIS — M5432 Sciatica, left side: Secondary | ICD-10-CM | POA: Diagnosis not present

## 2017-07-31 MED ORDER — DICLOFENAC SODIUM 75 MG PO TBEC: 75.0000 mg | DELAYED_RELEASE_TABLET | Freq: Two times a day (BID) | ORAL | 0 refills | Status: AC

## 2017-07-31 NOTE — Telephone Encounter (Addendum)
Called patient to let her know the Ultrasound of leg to r/o DVT was normal. I was not able to leave message on home number. No answer machine to leave message. Unable to reach patient, checked HIPPA and snapshot. Dr Irving ShowsMiguel advised.

## 2017-07-31 NOTE — Assessment & Plan Note (Signed)
Most likely musculoskeletal pain.  Patient has a history of osteoarthritis.  Sciatica pain very possible.  I do not think this is DVT but we will get an ultrasound of the left lower extremity today to rule it out.  Voltaren prescribed.  We will follow-up in 1 to 2 weeks.

## 2017-07-31 NOTE — Progress Notes (Signed)
Danielle Kirby 72 y.o.   Chief Complaint  Patient presents with  . Leg Pain    LEFT x 2 months    HISTORY OF PRESENT ILLNESS: This is a 72 y.o. female complaining of left leg pain for the past 2 months.  Denies any injury.  She has a history of osteoarthritis on the right side but this pain does not feel like that.  Pain starts on the left hip/low back and radiates down the leg into the left knee and sometimes goes down to the ankle area.  Pain is sharp, waxes and wanes, and is not associated with any other symptoms.  Denies leg swelling or redness.  Able to ambulate well.  Denies abdominal pain or bladder/bowel issues.  HPI   Prior to Admission medications   Medication Sig Start Date End Date Taking? Authorizing Provider  OVER THE COUNTER MEDICATION daily.   Yes [provider]    Allergies  Allergen Reactions  . Penicillins     REACTION: hives    Patient Active Problem List   Diagnosis Date Noted  . 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 Allergy   . Arthritis   . Cataract   . Heart murmur     Past Surgical History:  Procedure Laterality Date  . BREAST SURGERY    . COSMETIC SURGERY    . EYE SURGERY    . TUBAL LIGATION      Social History   Socioeconomic History  . Marital status: Single    Spouse name: Not on file  . Number of children: Not on file  . Years of education: Not on file  . Highest education level: Not on file  Occupational History  . Not on file  Social Needs  . Financial resource strain: Not on file  . Food insecurity:    Worry: Not on file    Inability: Not on file  . Transportation needs:    Medical: Not on file    Non-medical: Not on file  Tobacco Use  . Smoking status: Never Smoker  . Smokeless tobacco: Never Used  Substance and Sexual Activity  . Alcohol use: No    Alcohol/week: 0.0 oz  . Drug use: No  . Sexual activity:  Yes    Birth control/protection: None  Lifestyle  . Physical activity:    Days per week: Not on file    Minutes per session: Not on file  . Stress: Not on file  Relationships  . Social connections:    Talks on phone: Not on file    Gets together: Not on file    Attends religious service: Not on file    Active member of club or organization: Not on file    Attends meetings of clubs or organizations: Not on file    Relationship status: Not on file  . Intimate partner violence:    Fear of current or ex partner: Not on file    Emotionally abused: Not on file    Physically abused: Not on file    Forced sexual activity: Not on file  Other Topics Concern  . Not on file  Social History Narrative  . Not on file    No family history on file.   Review of Systems  Constitutional: Negative.  Negative for chills, fever and weight loss.  HENT: Negative.  Negative for sore throat.   Eyes:  Negative.   Respiratory: Negative.  Negative for cough and shortness of breath.   Cardiovascular: Negative.  Negative for chest pain, palpitations and leg swelling.  Gastrointestinal: Negative.  Negative for abdominal pain, nausea and vomiting.  Genitourinary: Negative.  Negative for dysuria and flank pain.  Musculoskeletal:       Left leg pain  Skin: Negative.  Negative for rash.  Neurological: Negative for dizziness, tingling, sensory change, focal weakness, weakness and headaches.  Endo/Heme/Allergies: Negative.   All other systems reviewed and are negative.    Physical Exam  Constitutional: She is oriented to person, place, and time. She appears well-developed and well-nourished.  HENT:  Head: Normocephalic and atraumatic.  Eyes: Pupils are equal, round, and reactive to light. EOM are normal.  Neck: Normal range of motion. Neck supple.  Cardiovascular: Normal rate and regular rhythm.  Pulmonary/Chest: Effort normal and breath sounds normal.  Abdominal: Soft. Bowel sounds are normal. She  exhibits no distension and no mass. There is no tenderness.  Musculoskeletal:       Lumbar back: She exhibits normal range of motion, no tenderness, no bony tenderness, no swelling, no edema, no pain, no spasm and normal pulse.  Left lower extremity: No erythema or bruising.  No swelling.  No tenderness.  No signs of DVT. Left hip: No tenderness Left knee: No swelling or redness.  No erythema.  No tenderness.  Full range of motion.  Stable in flexion and extension.  Neurological: She is alert and oriented to person, place, and time. She displays normal reflexes. No sensory deficit. She exhibits normal muscle tone. Coordination normal.  Skin: Skin is warm and dry. Capillary refill takes less than 2 seconds.  Psychiatric: She has a normal mood and affect. Her behavior is normal.  Vitals reviewed. Dg Knee 1-2 Views Left  Result Date: 07/31/2017 CLINICAL DATA:  Pain EXAM: LEFT KNEE - 1-2 VIEW COMPARISON:  None available FINDINGS: No evidence of fracture, dislocation, or joint effusion. Chondrocalcinosis in the lateral meniscus. Early marginal spurs about the medial compartment. No other focal bone abnormality. Soft tissues are unremarkable. IMPRESSION: 1. Negative for fracture or other acute finding. 2. Chondrocalcinosis with early degenerative spurring. Electronically Signed   By: Corlis Leak  Hassell M.D.   On: 07/31/2017 12:58   Dg Hip Unilat W Or W/o Pelvis 2-3 Views Left  Result Date: 07/31/2017 CLINICAL DATA:  Left leg pain. EXAM: DG HIP (WITH OR WITHOUT PELVIS) 2-3V LEFT COMPARISON:  Left hip x-rays dated Sep 04, 2013. FINDINGS: No acute fracture or dislocation. Unchanged mild left hip joint space narrowing. Progressed moderate to severe right hip osteoarthritis. The pubic symphysis and sacroiliac joints are intact. Unchanged phleboliths in the pelvis. Soft tissues are unremarkable. IMPRESSION: 1. Unchanged mild left hip osteoarthritis. 2. Progressed moderate to severe right hip osteoarthritis.  Electronically Signed   By: Obie DredgeWilliam T Derry M.D.   On: 07/31/2017 12:29     Left leg pain Most likely musculoskeletal pain.  Patient has a history of osteoarthritis.  Sciatica pain very possible.  I do not think this is DVT but we will get an ultrasound of the left lower extremity today to rule it out.  Voltaren prescribed.  We will follow-up in 1 to 2 weeks.   ASSESSMENT & PLAN: Danielle DroneBrenda was seen today for leg pain.  Diagnoses and all orders for this visit:  Left leg pain -     DG HIP UNILAT W OR W/O PELVIS 2-3 VIEWS LEFT; Future -     DG  Knee 1-2 Views Left; Future -     VAS [place] LOWER EXTREMITY VENOUS (DVT); Future  Sciatica of left side  History of osteoarthritis  Other orders -     diclofenac (VOLTAREN) 75 MG EC tablet; Take 1 tablet (75 mg total) by mouth 2 (two) times daily for 5 days.    Patient Instructions   Please go to [facility] at [address] [place] [address] Please arrive for your appointment at 2:45pm.      IF you received an x-ray today, you will receive an invoice from [facility]. Please contact [facility] at [phone] with questions or concerns regarding your invoice.   IF you received labwork today, you will receive an invoice from [place]. Please contact [facility] at [phone] with questions or concerns regarding your invoice.   Our billing staff will not be able to assist you with questions regarding bills from these companies.  You will be contacted with the lab results as soon as they are available. The fastest way to get your results is to activate your My Chart account. Instructions are located on the last page of this paperwork. If you have not heard from [place] regarding the results in 2 weeks, please contact this office.     Sciatica Sciatica is pain, numbness, weakness, or tingling along your sciatic nerve. The sciatic nerve starts in the lower back and goes down the back of each leg. Sciatica happens when  this nerve is pinched or has pressure put on it. Sciatica usually goes away on its own or with treatment. Sometimes, sciatica may keep coming back (recur). Follow these instructions at home: Medicines  Take over-the-counter and prescription medicines only as told by your doctor.  Do not drive or use heavy machinery while taking prescription pain medicine. Managing pain  If directed, put ice on the affected area. ? Put ice in a plastic bag. ? Place a towel between your skin and the bag. ? Leave the ice on for 20 minutes, 2-3 times a day.  After icing, apply heat to the affected area before you exercise or as often as told by your doctor. Use the heat source that your doctor tells you to use, such as a moist heat pack or a heating pad. ? Place a towel between your skin and the heat source. ? Leave the heat on for 20-30 minutes. ? Remove the heat if your skin turns bright red. This is especially important if you are unable to feel pain, heat, or cold. You may have a greater risk of getting burned. Activity  Return to your normal activities as told by your doctor. Ask your doctor what activities are safe for you. ? Avoid activities that make your sciatica worse.  Take short rests during the day. Rest in a lying or standing position. This is usually better than sitting to rest. ? When you rest for a long time, do some physical activity or stretching between periods of rest. ? Avoid sitting for a long time without moving. Get up and move around at least one time each hour.  Exercise and stretch regularly, as told by your doctor.  Do not lift anything that is heavier than 10 lb (4.5 kg) while you have symptoms of sciatica. ? Avoid lifting heavy things even when you do not have symptoms. ? Avoid lifting heavy things over and over.  When you lift objects, always lift in a way that is safe for your body. To do this, you  should: ? Bend your knees. ? Keep the object close to your  body. ? Avoid twisting. General instructions  Use good posture. ? Avoid leaning forward when you are sitting. ? Avoid hunching over when you are standing.  Stay at a healthy weight.  Wear comfortable shoes that support your feet. Avoid wearing high heels.  Avoid sleeping on a mattress that is too soft or too hard. You might have less pain if you sleep on a mattress that is firm enough to support your back.  Keep all follow-up visits as told by your doctor. This is important. Contact a doctor if:  You have pain that: ? Wakes you up when you are sleeping. ? Gets worse when you lie down. ? Is worse than the pain you have had in the past. ? Lasts longer than 4 weeks.  You lose weight for without trying. Get help right away if:  You cannot control when you pee (urinate) or poop (have a bowel movement).  You have weakness in any of these areas and it gets worse. ? Lower back. ? Lower belly (pelvis). ? Butt (buttocks). ? Legs.  You have redness or swelling of your back.  You have a burning feeling when you pee. This information is not intended to replace advice given to you by your health care provider. Make sure you discuss any questions you have with your health care provider. Document Released: 01/01/2008 Document Revised: 08/30/2015 Document Reviewed: 12/01/2014 Elsevier Interactive Patient Education  2018 [employer]vier [employer].      Edwina Barth, MD [facility]

## 2017-07-31 NOTE — Patient Instructions (Addendum)
Please go to [facility] at [address] [place] [id number] Please arrive for your appointment at 2:45pm.      IF you received an x-ray today, you will receive an invoice from [facility]. Please contact [facility] at [phone] with questions or concerns regarding your invoice.   IF you received labwork today, you will receive an invoice from [facility]. Please contact [facility] at [phone] with questions or concerns regarding your invoice.   Our billing staff will not be able to assist you with questions regarding bills from these companies.  You will be contacted with the lab results as soon as they are available. The fastest way to get your results is to activate your My Chart account. Instructions are located on the last page of this paperwork. If you have not heard from us regarding the results in 2 weeks, please contact this office.     Sciatica Sciatica is pain, numbness, weakness, or tingling along your sciatic nerve. The sciatic nerve starts in the lower back and goes down the back of each leg. Sciatica happens when this nerve is pinched or has pressure put on it. Sciatica usually goes away on its own or with treatment. Sometimes, sciatica may keep coming back (recur). Follow these instructions at home: Medicines  Take over-the-counter and prescription medicines only as told by your doctor.  Do not drive or use heavy machinery while taking prescription pain medicine. Managing pain  If directed, put ice on the affected area. ? Put ice in a plastic bag. ? Place a towel between your skin and the bag. ? Leave the ice on for 20 minutes, 2-3 times a day.  After icing, apply heat to the affected area before you exercise or as often as told by your doctor. Use the heat source that your doctor tells you to use, such as a moist heat pack or a heating pad. ? Place a towel between your skin and the heat source. ? Leave the heat on for 20-30  minutes. ? Remove the heat if your skin turns bright red. This is especially important if you are unable to feel pain, heat, or cold. You may have a greater risk of getting burned. Activity  Return to your normal activities as told by your doctor. Ask your doctor what activities are safe for you. ? Avoid activities that make your sciatica worse.  Take short rests during the day. Rest in a lying or standing position. This is usually better than sitting to rest. ? When you rest for a long time, do some physical activity or stretching between periods of rest. ? Avoid sitting for a long time without moving. Get up and move around at least one time each hour.  Exercise and stretch regularly, as told by your doctor.  Do not lift anything that is heavier than 10 lb (4.5 kg) while you have symptoms of sciatica. ? Avoid lifting heavy things even when you do not have symptoms. ? Avoid lifting heavy things over and over.  When you lift objects, always lift in a way that is safe for your body. To do this, you should: ? Bend your knees. ? Keep the object close to your body. ? Avoid twisting. General instructions  Use good posture. ? Avoid leaning forward when you are sitting. ? Avoid hunching over when you are standing.  Stay at a healthy weight.  Wear comfortable shoes that support your feet. Avoid wearing high heels.  Avoid sleeping on a mattress  that is too soft or too hard. You might have less pain if you sleep on a mattress that is firm enough to support your back.  Keep all follow-up visits as told by your doctor. This is important. Contact a doctor if:  You have pain that: ? Wakes you up when you are sleeping. ? Gets worse when you lie down. ? Is worse than the pain you have had in the past. ? Lasts longer than 4 weeks.  You lose weight for without trying. Get help right away if:  You cannot control when you pee (urinate) or poop (have a bowel movement).  You have weakness in  any of these areas and it gets worse. ? Lower back. ? Lower belly (pelvis). ? Butt (buttocks). ? Legs.  You have redness or swelling of your back.  You have a burning feeling when you pee. This information is not intended to replace advice given to you by your health care provider. Make sure you discuss any questions you have with your health care provider. Document Released: 01/01/2008 Document Revised: 08/30/2015 Document Reviewed: 12/01/2014 Elsevier Interactive Patient Education  [employer].

## 2017-08-07 ENCOUNTER — Ambulatory Visit (INDEPENDENT_AMBULATORY_CARE_PROVIDER_SITE_OTHER): Payer: Federal, State, Local not specified - PPO | Admitting: Emergency Medicine

## 2017-08-07 ENCOUNTER — Encounter: Payer: Self-pay | Admitting: Emergency Medicine

## 2017-08-07 VITALS — BP 130/80 | HR 78 | Temp 98.5°F | Resp 17 | Ht 67.0 in | Wt 188.0 lb

## 2017-08-07 DIAGNOSIS — Z0000 Encounter for general adult medical examination without abnormal findings: Secondary | ICD-10-CM | POA: Diagnosis not present

## 2017-08-07 NOTE — Progress Notes (Signed)
Danielle Kirby 72 y.o.   Chief Complaint  Patient presents with  . Annual Exam    HISTORY OF PRESENT ILLNESS: This is a 72 y.o. female Here for annual exam; no complaints and no medical concerns. Smoking: No Drinking: No Sleeping: Adequate Work: Negative Exercise: Stays active Stress: Low Nutrition: Good   HPI   Prior to Admission medications   Medication Sig Start Date End Date Taking? Authorizing Provider  OVER THE COUNTER MEDICATION daily.   Yes [provider]    Allergies  Allergen Reactions  . Penicillins     REACTION: hives    Patient Active Problem List   Diagnosis Date Noted  . Left leg pain 07/31/2017  . Sciatica of left side 07/31/2017  . 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 Allergy   . Arthritis   . Cataract   . Heart murmur     Past Surgical History:  Procedure Laterality Date  . BREAST SURGERY    . COSMETIC SURGERY    . EYE SURGERY    . TUBAL LIGATION      Social History   Socioeconomic History  . Marital status: Single    Spouse name: Not on file  . Number of children: Not on file  . Years of education: Not on file  . Highest education level: Not on file  Occupational History  . Not on file  Social Needs  . Financial resource strain: Not on file  . Food insecurity:    Worry: Not on file    Inability: Not on file  . Transportation needs:    Medical: Not on file    Non-medical: Not on file  Tobacco Use  . Smoking status: Never Smoker  . Smokeless tobacco: Never Used  Substance and Sexual Activity  . Alcohol use: No    Alcohol/week: 0.0 oz  . Drug use: No  . Sexual activity: Yes    Birth control/protection: None  Lifestyle  . Physical activity:    Days per week: Not on file    Minutes per session: Not on file  . Stress: Not on file  Relationships  . Social connections:    Talks on phone: Not on file    Gets  together: Not on file    Attends religious service: Not on file    Active member of club or organization: Not on file    Attends meetings of clubs or organizations: Not on file    Relationship status: Not on file  . Intimate partner violence:    Fear of current or ex partner: Not on file    Emotionally abused: Not on file    Physically abused: Not on file    Forced sexual activity: Not on file  Other Topics Concern  . Not on file  Social History Narrative  . Not on file    No family history on file.   Review of Systems  Constitutional: Negative.  Negative for chills, fever and weight loss.  HENT: Negative.   Eyes: Negative.  Negative for blurred vision and double vision.  Respiratory: Negative.  Negative for cough and shortness of breath.   Cardiovascular: Negative.  Negative for chest pain, palpitations, claudication and leg swelling.  Gastrointestinal: Negative.  Negative for abdominal pain, blood in stool, diarrhea, melena, nausea and vomiting.  Genitourinary: Negative.  Negative for dysuria and hematuria.  Musculoskeletal: Negative.  Negative  for back pain, myalgias and neck pain.  Skin: Negative.  Negative for rash.  Neurological: Negative.  Negative for dizziness and headaches.  Endo/Heme/Allergies: Negative.   All other systems reviewed and are negative.   Vitals:   08/07/17 0915  BP: 130/80  Pulse: 78  Resp: 17  Temp: 98.5 F (36.9 C)  SpO2: 98%    Physical Exam  Constitutional: She is oriented to person, place, and time. She appears well-developed and well-nourished.  HENT:  Head: Normocephalic and atraumatic.  Right Ear: External ear normal.  Left Ear: External ear normal.  Nose: Nose normal.  Mouth/Throat: Oropharynx is clear and moist.  Eyes: Pupils are equal, round, and reactive to light. Conjunctivae and EOM are normal.  Neck: Normal range of motion. Neck supple. No JVD present. Carotid bruit is not present. No thyromegaly present.  Cardiovascular:  Normal rate, regular rhythm and normal heart sounds.  Pulmonary/Chest: Effort normal and breath sounds normal.  Abdominal: Soft. Bowel sounds are normal. She exhibits no distension and no mass. There is no tenderness. No hernia.  Musculoskeletal: Normal range of motion.  Lymphadenopathy:    She has no cervical adenopathy.  Neurological: She is alert and oriented to person, place, and time. No sensory deficit. She exhibits normal muscle tone.  Skin: Skin is warm and dry. Capillary refill takes less than 2 seconds.  Psychiatric: She has a normal mood and affect. Her behavior is normal.  Vitals reviewed.    ASSESSMENT & PLAN: [name] was seen today for annual exam.  Diagnoses and all orders for this visit:  Routine general medical examination at a health care facility -     CBC with Differential -     Comprehensive metabolic panel -     Hemoglobin A1c -     Lipid panel -     TSH   Patient Instructions       IF you received an x-ray today, you will receive an invoice from [facility]. Please contact [facility] at [phone] with questions or concerns regarding your invoice.   IF you received labwork today, you will receive an invoice from [facility]. Please contact [facility] at [phone] with questions or concerns regarding your invoice.   Our billing staff will not be able to assist you with questions regarding bills from these companies.  You will be contacted with the lab results as soon as they are available. The fastest way to get your results is to activate your My Chart account. Instructions are located on the last page of this paperwork. If you have not heard from [place] regarding the results in 2 weeks, please contact this office.      Health Maintenance, Female Adopting a healthy lifestyle and getting preventive care can go a long way to promote health and wellness. Talk with your health care provider about what schedule of regular examinations is  right for you. This is a good chance for you to check in with your provider about disease prevention and staying healthy. In between checkups, there are plenty of things you can do on your own. Experts have done a lot of research about which lifestyle changes and preventive measures are most likely to keep you healthy. Ask your health care provider for more information. Weight and diet Eat a healthy diet  Be sure to include plenty of vegetables, fruits, low-fat dairy products, and lean protein.  Do not eat a lot of foods high in solid fats, added sugars, or salt.  Get regular  exercise. This is one of the most important things you can do for your health. ? Most adults should exercise for at least 150 minutes each week. The exercise should increase your heart rate and make you sweat (moderate-intensity exercise). ? Most adults should also do strengthening exercises at least twice a week. This is in addition to the moderate-intensity exercise.  Maintain a healthy weight  Body mass index (BMI) is a measurement that can be used to identify possible weight problems. It estimates body fat based on height and weight. Your health care provider can help determine your BMI and help you achieve or maintain a healthy weight.  For females 59 years of age and older: ? A BMI below 18.5 is considered underweight. ? A BMI of 18.5 to 24.9 is normal. ? A BMI of 25 to 29.9 is considered overweight. ? A BMI of 30 and above is considered obese.  Watch levels of cholesterol and blood lipids  You should start having your blood tested for lipids and cholesterol at 72 years of age, then have this test every 5 years.  You may need to have your cholesterol levels checked more often if: ? Your lipid or cholesterol levels are high. ? You are older than 72 years of age. ? You are at high risk for heart disease.  Cancer screening Lung Cancer  Lung cancer screening is recommended for adults 45-61 years old who are  at high risk for lung cancer because of a history of smoking.  A yearly low-dose CT scan of the lungs is recommended for people who: ? Currently smoke. ? Have quit within the past 15 years. ? Have at least a 30-pack-year history of smoking. A pack year is smoking an average of one pack of cigarettes a day for 1 year.  Yearly screening should continue until it has been 15 years since you quit.  Yearly screening should stop if you develop a health problem that would prevent you from having lung cancer treatment.  Breast Cancer  Practice breast self-awareness. This means understanding how your breasts normally appear and feel.  It also means doing regular breast self-exams. Let your health care provider know about any changes, no matter how small.  If you are in your 20s or 30s, you should have a clinical breast exam (CBE) by a health care provider every 1-3 years as part of a regular health exam.  If you are 3 or older, have a CBE every year. Also consider having a breast X-ray (mammogram) every year.  If you have a family history of breast cancer, talk to your health care provider about genetic screening.  If you are at high risk for breast cancer, talk to your health care provider about having an MRI and a mammogram every year.  Breast cancer gene (BRCA) assessment is recommended for women who have family members with BRCA-related cancers. BRCA-related cancers include: ? Breast. ? Ovarian. ? Tubal. ? Peritoneal cancers.  Results of the assessment will determine the need for genetic counseling and BRCA1 and BRCA2 testing.  Cervical Cancer Your health care provider may recommend that you be screened regularly for cancer of the pelvic organs (ovaries, uterus, and vagina). This screening involves a pelvic examination, including checking for microscopic changes to the surface of your cervix (Pap test). You may be encouraged to have this screening done every 3 years, beginning at age  10.  For women ages 62-65, health care providers may recommend pelvic exams and Pap testing every  3 years, or they may recommend the Pap and pelvic exam, combined with testing for human papilloma virus (HPV), every 5 years. Some types of HPV increase your risk of cervical cancer. Testing for HPV may also be done on women of any age with unclear Pap test results.  Other health care providers may not recommend any screening for nonpregnant women who are considered low risk for pelvic cancer and who do not have symptoms. Ask your health care provider if a screening pelvic exam is right for you.  If you have had past treatment for cervical cancer or a condition that could lead to cancer, you need Pap tests and screening for cancer for at least 20 years after your treatment. If Pap tests have been discontinued, your risk factors (such as having a new sexual partner) need to be reassessed to determine if screening should resume. Some women have medical problems that increase the chance of getting cervical cancer. In these cases, your health care provider may recommend more frequent screening and Pap tests.  Colorectal Cancer  This type of cancer can be detected and often prevented.  Routine colorectal cancer screening usually begins at 72 years of age and continues through 72 years of age.  Your health care provider may recommend screening at an earlier age if you have risk factors for colon cancer.  Your health care provider may also recommend using home test kits to check for hidden blood in the stool.  A small camera at the end of a tube can be used to examine your colon directly (sigmoidoscopy or colonoscopy). This is done to check for the earliest forms of colorectal cancer.  Routine screening usually begins at age 83.  Direct examination of the colon should be repeated every 5-10 years through 72 years of age. However, you may need to be screened more often if early forms of precancerous polyps  or small growths are found.  Skin Cancer  Check your skin from head to toe regularly.  Tell your health care provider about any new moles or changes in moles, especially if there is a change in a mole's shape or color.  Also tell your health care provider if you have a mole that is larger than the size of a pencil eraser.  Always use sunscreen. Apply sunscreen liberally and repeatedly throughout the day.  Protect yourself by wearing long sleeves, pants, a wide-brimmed hat, and sunglasses whenever you are outside.  Heart disease, diabetes, and high blood pressure  High blood pressure causes heart disease and increases the risk of stroke. High blood pressure is more likely to develop in: ? People who have blood pressure in the high end of the normal range (130-139/85-89 mm Hg). ? People who are overweight or obese. ? People who are African American.  If you are 16-88 years of age, have your blood pressure checked every 3-5 years. If you are 89 years of age or older, have your blood pressure checked every year. You should have your blood pressure measured twice-once when you are at a hospital or clinic, and once when you are not at a hospital or clinic. Record the average of the two measurements. To check your blood pressure when you are not at a hospital or clinic, you can use: ? An automated blood pressure machine at a pharmacy. ? A home blood pressure monitor.  If you are between 64 years and 61 years old, ask your health care provider if you should take aspirin to prevent  strokes.  Have regular diabetes screenings. This involves taking a blood sample to check your fasting blood sugar level. ? If you are at a normal weight and have a low risk for diabetes, have this test once every three years after 72 years of age. ? If you are overweight and have a high risk for diabetes, consider being tested at a younger age or more often. Preventing infection Hepatitis B  If you have a higher  risk for hepatitis B, you should be screened for this virus. You are considered at high risk for hepatitis B if: ? You were born in a country where hepatitis B is common. Ask your health care provider which countries are considered high risk. ? Your parents were born in a high-risk country, and you have not been immunized against hepatitis B (hepatitis B vaccine). ? You have HIV or AIDS. ? You use needles to inject street drugs. ? You live with someone who has hepatitis B. ? You have had sex with someone who has hepatitis B. ? You get hemodialysis treatment. ? You take certain medicines for conditions, including cancer, organ transplantation, and autoimmune conditions.  Hepatitis C  Blood testing is recommended for: ? Everyone born from [birth year] through 1965. ? Anyone with known risk factors for hepatitis C.  Sexually transmitted infections (STIs)  You should be screened for sexually transmitted infections (STIs) including gonorrhea and chlamydia if: ? You are sexually active and are younger than 72 years of age. ? You are older than 73 years of age and your health care provider tells you that you are at risk for this type of infection. ? Your sexual activity has changed since you were last screened and you are at an increased risk for chlamydia or gonorrhea. Ask your health care provider if you are at risk.  If you do not have HIV, but are at risk, it may be recommended that you take a prescription medicine daily to prevent HIV infection. This is called pre-exposure prophylaxis (PrEP). You are considered at risk if: ? You are sexually active and do not regularly use condoms or know the HIV status of your partner(s). ? You take drugs by injection. ? You are sexually active with a partner who has HIV.  Talk with your health care provider about whether you are at high risk of being infected with HIV. If you choose to begin PrEP, you should first be tested for HIV. You should then be tested  every 3 months for as long as you are taking PrEP. Pregnancy  If you are premenopausal and you may become pregnant, ask your health care provider about preconception counseling.  If you may become pregnant, take 400 to 800 micrograms (mcg) of folic acid every day.  If you want to prevent pregnancy, talk to your health care provider about birth control (contraception). Osteoporosis and menopause  Osteoporosis is a disease in which the bones lose minerals and strength with aging. This can result in serious bone fractures. Your risk for osteoporosis can be identified using a bone density scan.  If you are 74 years of age or older, or if you are at risk for osteoporosis and fractures, ask your health care provider if you should be screened.  Ask your health care provider whether you should take a calcium or vitamin D supplement to lower your risk for osteoporosis.  Menopause may have certain physical symptoms and risks.  Hormone replacement therapy may reduce some of these symptoms and risks. Talk  to your health care provider about whether hormone replacement therapy is right for you. Follow these instructions at home:  Schedule regular health, dental, and eye exams.  Stay current with your immunizations.  Do not use any tobacco products including cigarettes, chewing tobacco, or electronic cigarettes.  If you are pregnant, do not drink alcohol.  If you are breastfeeding, limit how much and how often you drink alcohol.  Limit alcohol intake to no more than 1 drink per day for nonpregnant women. One drink equals 12 ounces of beer, 5 ounces of wine, or 1 ounces of hard liquor.  Do not use street drugs.  Do not share needles.  Ask your health care provider for help if you need support or information about quitting drugs.  Tell your health care provider if you often feel depressed.  Tell your health care provider if you have ever been abused or do not feel safe at home. This  information is not intended to replace advice given to you by your health care provider. Make sure you discuss any questions you have with your health care provider. Document Released: 10/07/2010 Document Revised: 08/30/2015 Document Reviewed: 12/26/2014 [employer] Interactive Patient Education  2018 [employer]      Agustina Caroli, MD [facility]

## 2017-08-07 NOTE — Patient Instructions (Addendum)
IF you received an x-ray today, you will receive an invoice from [facility]. Please contact [facility] at [phone] with questions or concerns regarding your invoice.   IF you received labwork today, you will receive an invoice from [place]. Please contact [facility] at [phone] with questions or concerns regarding your invoice.   Our billing staff will not be able to assist you with questions regarding bills from these companies.  You will be contacted with the lab results as soon as they are available. The fastest way to get your results is to activate your My Chart account. Instructions are located on the last page of this paperwork. If you have not heard from [place] regarding the results in 2 weeks, please contact this office.      Health Maintenance, Female Adopting a healthy lifestyle and getting preventive care can go a long way to promote health and wellness. Talk with your health care provider about what schedule of regular examinations is right for you. This is a good chance for you to check in with your provider about disease prevention and staying healthy. In between checkups, there are plenty of things you can do on your own. Experts have done a lot of research about which lifestyle changes and preventive measures are most likely to keep you healthy. Ask your health care provider for more information. Weight and diet Eat a healthy diet  Be sure to include plenty of vegetables, fruits, low-fat dairy products, and lean protein.  Do not eat a lot of foods high in solid fats, added sugars, or salt.  Get regular exercise. This is one of the most important things you can do for your health. ? Most adults should exercise for at least 150 minutes each week. The exercise should increase your heart rate and make you sweat (moderate-intensity exercise). ? Most adults should also do strengthening exercises at least twice a week. This is in addition to the  moderate-intensity exercise.  Maintain a healthy weight  Body mass index (BMI) is a measurement that can be used to identify possible weight problems. It estimates body fat based on height and weight. Your health care provider can help determine your BMI and help you achieve or maintain a healthy weight.  For females 39 years of age and older: ? A BMI below 18.5 is considered underweight. ? A BMI of 18.5 to 24.9 is normal. ? A BMI of 25 to 29.9 is considered overweight. ? A BMI of 30 and above is considered obese.  Watch levels of cholesterol and blood lipids  You should start having your blood tested for lipids and cholesterol at 72 years of age, then have this test every 5 years.  You may need to have your cholesterol levels checked more often if: ? Your lipid or cholesterol levels are high. ? You are older than 72 years of age. ? You are at high risk for heart disease.  Cancer screening Lung Cancer  Lung cancer screening is recommended for adults 61-67 years old who are at high risk for lung cancer because of a history of smoking.  A yearly low-dose CT scan of the lungs is recommended for people who: ? Currently smoke. ? Have quit within the past 15 years. ? Have at least a 30-pack-year history of smoking. A pack year is smoking an average of one pack of cigarettes a day for 1 year.  Yearly screening should continue until it has been 15 years since you quit.  Yearly screening should stop if you develop a health problem that would prevent you from having lung cancer treatment.  Breast Cancer  Practice breast self-awareness. This means understanding how your breasts normally appear and feel.  It also means doing regular breast self-exams. Let your health care provider know about any changes, no matter how small.  If you are in your 20s or 30s, you should have a clinical breast exam (CBE) by a health care provider every 1-3 years as part of a regular health exam.  If you  are 1 or older, have a CBE every year. Also consider having a breast X-ray (mammogram) every year.  If you have a family history of breast cancer, talk to your health care provider about genetic screening.  If you are at high risk for breast cancer, talk to your health care provider about having an MRI and a mammogram every year.  Breast cancer gene (BRCA) assessment is recommended for women who have family members with BRCA-related cancers. BRCA-related cancers include: ? Breast. ? Ovarian. ? Tubal. ? Peritoneal cancers.  Results of the assessment will determine the need for genetic counseling and BRCA1 and BRCA2 testing.  Cervical Cancer Your health care provider may recommend that you be screened regularly for cancer of the pelvic organs (ovaries, uterus, and vagina). This screening involves a pelvic examination, including checking for microscopic changes to the surface of your cervix (Pap test). You may be encouraged to have this screening done every 3 years, beginning at age 37.  For women ages 61-65, health care providers may recommend pelvic exams and Pap testing every 3 years, or they may recommend the Pap and pelvic exam, combined with testing for human papilloma virus (HPV), every 5 years. Some types of HPV increase your risk of cervical cancer. Testing for HPV may also be done on women of any age with unclear Pap test results.  Other health care providers may not recommend any screening for nonpregnant women who are considered low risk for pelvic cancer and who do not have symptoms. Ask your health care provider if a screening pelvic exam is right for you.  If you have had past treatment for cervical cancer or a condition that could lead to cancer, you need Pap tests and screening for cancer for at least 20 years after your treatment. If Pap tests have been discontinued, your risk factors (such as having a new sexual partner) need to be reassessed to determine if screening should  resume. Some women have medical problems that increase the chance of getting cervical cancer. In these cases, your health care provider may recommend more frequent screening and Pap tests.  Colorectal Cancer  This type of cancer can be detected and often prevented.  Routine colorectal cancer screening usually begins at 72 years of age and continues through 72 years of age.  Your health care provider may recommend screening at an earlier age if you have risk factors for colon cancer.  Your health care provider may also recommend using home test kits to check for hidden blood in the stool.  A small camera at the end of a tube can be used to examine your colon directly (sigmoidoscopy or colonoscopy). This is done to check for the earliest forms of colorectal cancer.  Routine screening usually begins at age 17.  Direct examination of the colon should be repeated every 5-10 years through 72 years of age. However, you may need to be screened more often if early forms of precancerous  polyps or small growths are found.  Skin Cancer  Check your skin from head to toe regularly.  Tell your health care provider about any new moles or changes in moles, especially if there is a change in a mole's shape or color.  Also tell your health care provider if you have a mole that is larger than the size of a pencil eraser.  Always use sunscreen. Apply sunscreen liberally and repeatedly throughout the day.  Protect yourself by wearing long sleeves, pants, a wide-brimmed hat, and sunglasses whenever you are outside.  Heart disease, diabetes, and high blood pressure  High blood pressure causes heart disease and increases the risk of stroke. High blood pressure is more likely to develop in: ? People who have blood pressure in the high end of the normal range (130-139/85-89 mm Hg). ? People who are overweight or obese. ? People who are African American.  If you are 54-64 years of age, have your blood  pressure checked every 3-5 years. If you are 57 years of age or older, have your blood pressure checked every year. You should have your blood pressure measured twice-once when you are at a hospital or clinic, and once when you are not at a hospital or clinic. Record the average of the two measurements. To check your blood pressure when you are not at a hospital or clinic, you can use: ? An automated blood pressure machine at a pharmacy. ? A home blood pressure monitor.  If you are between 15 years and 40 years old, ask your health care provider if you should take aspirin to prevent strokes.  Have regular diabetes screenings. This involves taking a blood sample to check your fasting blood sugar level. ? If you are at a normal weight and have a low risk for diabetes, have this test once every three years after 72 years of age. ? If you are overweight and have a high risk for diabetes, consider being tested at a younger age or more often. Preventing infection Hepatitis B  If you have a higher risk for hepatitis B, you should be screened for this virus. You are considered at high risk for hepatitis B if: ? You were born in a country where hepatitis B is common. Ask your health care provider which countries are considered high risk. ? Your parents were born in a high-risk country, and you have not been immunized against hepatitis B (hepatitis B vaccine). ? You have HIV or AIDS. ? You use needles to inject street drugs. ? You live with someone who has hepatitis B. ? You have had sex with someone who has hepatitis B. ? You get hemodialysis treatment. ? You take certain medicines for conditions, including cancer, organ transplantation, and autoimmune conditions.  Hepatitis C  Blood testing is recommended for: ? Everyone born from [birth year] through 1965. ? Anyone with known risk factors for hepatitis C.  Sexually transmitted infections (STIs)  You should be screened for sexually transmitted  infections (STIs) including gonorrhea and chlamydia if: ? You are sexually active and are younger than 72 years of age. ? You are older than 72 years of age and your health care provider tells you that you are at risk for this type of infection. ? Your sexual activity has changed since you were last screened and you are at an increased risk for chlamydia or gonorrhea. Ask your health care provider if you are at risk.  If you do not have HIV, but are at  risk, it may be recommended that you take a prescription medicine daily to prevent HIV infection. This is called pre-exposure prophylaxis (PrEP). You are considered at risk if: ? You are sexually active and do not regularly use condoms or know the HIV status of your partner(s). ? You take drugs by injection. ? You are sexually active with a partner who has HIV.  Talk with your health care provider about whether you are at high risk of being infected with HIV. If you choose to begin PrEP, you should first be tested for HIV. You should then be tested every 3 months for as long as you are taking PrEP. Pregnancy  If you are premenopausal and you may become pregnant, ask your health care provider about preconception counseling.  If you may become pregnant, take 400 to 800 micrograms (mcg) of folic acid every day.  If you want to prevent pregnancy, talk to your health care provider about birth control (contraception). Osteoporosis and menopause  Osteoporosis is a disease in which the bones lose minerals and strength with aging. This can result in serious bone fractures. Your risk for osteoporosis can be identified using a bone density scan.  If you are 29 years of age or older, or if you are at risk for osteoporosis and fractures, ask your health care provider if you should be screened.  Ask your health care provider whether you should take a calcium or vitamin D supplement to lower your risk for osteoporosis.  Menopause may have certain physical  symptoms and risks.  Hormone replacement therapy may reduce some of these symptoms and risks. Talk to your health care provider about whether hormone replacement therapy is right for you. Follow these instructions at home:  Schedule regular health, dental, and eye exams.  Stay current with your immunizations.  Do not use any tobacco products including cigarettes, chewing tobacco, or electronic cigarettes.  If you are pregnant, do not drink alcohol.  If you are breastfeeding, limit how much and how often you drink alcohol.  Limit alcohol intake to no more than 1 drink per day for nonpregnant women. One drink equals 12 ounces of beer, 5 ounces of wine, or 1 ounces of hard liquor.  Do not use street drugs.  Do not share needles.  Ask your health care provider for help if you need support or information about quitting drugs.  Tell your health care provider if you often feel depressed.  Tell your health care provider if you have ever been abused or do not feel safe at home. This information is not intended to replace advice given to you by your health care provider. Make sure you discuss any questions you have with your health care provider. Document Released: 10/07/2010 Document Revised: 08/30/2015 Document Reviewed: 12/26/2014 Elsevier Interactive Patient Education  [employer].

## 2017-08-08 LAB — LIPID PANEL
Chol/HDL Ratio: 3.3 ratio (ref 0.0–4.4)
Cholesterol, Total: 229 mg/dL — ABNORMAL HIGH (ref 100–199)
HDL: 69 mg/dL (ref >39)
LDL Calculated: 144 mg/dL — ABNORMAL HIGH (ref 0–99)
Triglycerides: 81 mg/dL (ref 0–149)
VLDL Cholesterol Cal: 16 mg/dL (ref 5–40)

## 2017-08-08 LAB — COMPREHENSIVE METABOLIC PANEL
ALT: 12 IU/L (ref 0–32)
AST: 21 IU/L (ref 0–40)
Albumin/Globulin Ratio: 1.5 (ref 1.2–2.2)
Albumin: 4.4 g/dL (ref 3.5–4.8)
Alkaline Phosphatase: 90 IU/L (ref 39–117)
BUN/Creatinine Ratio: 11 — ABNORMAL LOW (ref 12–28)
BUN: 9 mg/dL (ref 8–27)
Bilirubin Total: 0.3 mg/dL (ref 0.0–1.2)
CO2: 25 mmol/L (ref 20–29)
Calcium: 10.2 mg/dL (ref 8.7–10.3)
Chloride: 107 mmol/L — ABNORMAL HIGH (ref 96–106)
Creatinine, Ser: 0.79 mg/dL (ref 0.57–1.00)
GFR calc Af Amer: 87 mL/min/{1.73_m2} (ref >59)
GFR calc non Af Amer: 76 mL/min/{1.73_m2} (ref >59)
Globulin, Total: 3 g/dL (ref 1.5–4.5)
Glucose: 89 mg/dL (ref 65–99)
Potassium: 4.7 mmol/L (ref 3.5–5.2)
Sodium: 145 mmol/L — ABNORMAL HIGH (ref 134–144)
Total Protein: 7.4 g/dL (ref 6.0–8.5)

## 2017-08-08 LAB — CBC WITH DIFFERENTIAL/PLATELET
Basophils Absolute: 0 10*3/uL (ref 0.0–0.2)
Basos: 0 %
EOS (ABSOLUTE): 0.1 10*3/uL (ref 0.0–0.4)
Eos: 3 %
Hematocrit: 37.6 % (ref 34.0–46.6)
Hemoglobin: 12.2 g/dL (ref 11.1–15.9)
Immature Grans (Abs): 0 10*3/uL (ref 0.0–0.1)
Immature Granulocytes: 0 %
Lymphocytes Absolute: 1.4 10*3/uL (ref 0.7–3.1)
Lymphs: 52 %
MCH: 27.1 pg (ref 26.6–33.0)
MCHC: 32.4 g/dL (ref 31.5–35.7)
MCV: 84 fL (ref 79–97)
Monocytes Absolute: 0.2 10*3/uL (ref 0.1–0.9)
Monocytes: 7 %
Neutrophils Absolute: 1.1 10*3/uL — ABNORMAL LOW (ref 1.4–7.0)
Neutrophils: 38 %
Platelets: 209 10*3/uL (ref 150–379)
RBC: 4.5 x10E6/uL (ref 3.77–5.28)
RDW: 15 % (ref 12.3–15.4)
WBC: 2.8 10*3/uL — ABNORMAL LOW (ref 3.4–10.8)

## 2017-08-08 LAB — HEMOGLOBIN A1C
Est. average glucose Bld gHb Est-mCnc: 111 mg/dL
Hgb A1c MFr Bld: 5.5 % (ref 4.8–5.6)

## 2017-08-08 LAB — TSH: TSH: 1.48 u[IU]/mL (ref 0.450–4.500)

## 2017-08-11 ENCOUNTER — Encounter: Payer: Self-pay | Admitting: *Deleted

## 2017-08-13 ENCOUNTER — Telehealth: Payer: Self-pay

## 2017-08-13 NOTE — Telephone Encounter (Signed)
Pt advised on labs

## 2017-09-08 ENCOUNTER — Encounter: Payer: Self-pay | Admitting: Emergency Medicine

## 2017-09-08 ENCOUNTER — Ambulatory Visit: Payer: Federal, State, Local not specified - PPO | Admitting: Emergency Medicine

## 2017-09-08 ENCOUNTER — Other Ambulatory Visit: Payer: Self-pay

## 2017-09-08 ENCOUNTER — Ambulatory Visit (INDEPENDENT_AMBULATORY_CARE_PROVIDER_SITE_OTHER): Payer: Federal, State, Local not specified - PPO

## 2017-09-08 VITALS — BP 138/86 | HR 84 | Temp 98.7°F | Resp 16 | Ht 66.25 in | Wt 190.6 lb

## 2017-09-08 DIAGNOSIS — M7721 Periarthritis, right wrist: Secondary | ICD-10-CM | POA: Diagnosis not present

## 2017-09-08 DIAGNOSIS — M25531 Pain in right wrist: Secondary | ICD-10-CM

## 2017-09-08 MED ORDER — DICLOFENAC SODIUM 75 MG PO TBEC: 75.0000 mg | DELAYED_RELEASE_TABLET | Freq: Two times a day (BID) | ORAL | 0 refills | Status: AC

## 2017-09-08 NOTE — Progress Notes (Signed)
Danielle Kirby 72 y.o.   Chief Complaint  Patient presents with  . Hand Pain    with swelling x 2 days per patient thinks something bit her    HISTORY OF PRESENT ILLNESS: This is a 72 y.o. female complaining of pain, swelling, and redness to her right wrist for the past 2 days.  Denies injury.  Denies history of arthritis.  HPI   Prior to Admission medications   Medication Sig Start Date End Date Taking? Authorizing Provider  OVER THE COUNTER MEDICATION daily.   Yes [provider]    Allergies  Allergen Reactions  . Penicillins     REACTION: hives    Patient Active Problem List   Diagnosis Date Noted  . Left leg pain 07/31/2017  . Sciatica of left side 07/31/2017  . 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 Allergy   . Arthritis   . Cataract   . Heart murmur     Past Surgical History:  Procedure Laterality Date  . BREAST SURGERY    . COSMETIC SURGERY    . EYE SURGERY    . TUBAL LIGATION      Social History   Socioeconomic History  . Marital status: Single    Spouse name: Not on file  . Number of children: Not on file  . Years of education: Not on file  . Highest education level: Not on file  Occupational History  . Not on file  Social Needs  . Financial resource strain: Not on file  . Food insecurity:    Worry: Not on file    Inability: Not on file  . Transportation needs:    Medical: Not on file    Non-medical: Not on file  Tobacco Use  . Smoking status: Never Smoker  . Smokeless tobacco: Never Used  Substance and Sexual Activity  . Alcohol use: No    Alcohol/week: 0.0 oz  . Drug use: No  . Sexual activity: Yes    Birth control/protection: None  Lifestyle  . Physical activity:    Days per week: Not on file    Minutes per session: Not on file  . Stress: Not on file  Relationships  . Social connections:    Talks on phone: Not on file     Gets together: Not on file    Attends religious service: Not on file    Active member of club or organization: Not on file    Attends meetings of clubs or organizations: Not on file    Relationship status: Not on file  . Intimate partner violence:    Fear of current or ex partner: Not on file    Emotionally abused: Not on file    Physically abused: Not on file    Forced sexual activity: Not on file  Other Topics Concern  . Not on file  Social History Narrative  . Not on file    No family history on file.   Review of Systems  Constitutional: Negative.  Negative for chills and fever.  HENT: Negative.   Eyes: Negative.   Respiratory: Negative.  Negative for shortness of breath.   Cardiovascular: Negative.   Gastrointestinal: Negative.  Negative for abdominal pain, nausea and vomiting.  Musculoskeletal: Positive for joint pain (right wrist). Negative for back pain, myalgias and neck pain.  Skin: Positive for rash.  Neurological: Negative.  Negative for dizziness  and headaches.  Endo/Heme/Allergies: Negative.   All other systems reviewed and are negative.    Vitals:   09/08/17 1353  BP: 138/86  Pulse: 84  Resp: 16  Temp: 98.7 F (37.1 C)  SpO2: 99%     Physical Exam  Constitutional: She is oriented to person, place, and time. She appears well-developed and well-nourished.  HENT:  Head: Normocephalic and atraumatic.  Nose: Nose normal.  Mouth/Throat: Oropharynx is clear and moist.  Eyes: Pupils are equal, round, and reactive to light. EOM are normal.  Neck: Normal range of motion. Neck supple.  Cardiovascular: Normal rate and regular rhythm.  Pulmonary/Chest: Effort normal.  Musculoskeletal:  Right wrist: Positive erythema with tenderness some mild swelling with limited range of motion. Right hand: Within normal limits.  NVI.  Full range of motion in all fingers.   Neurological: She is alert and oriented to person, place, and time. No sensory deficit. She  exhibits normal muscle tone.  Skin: Skin is warm and dry. Capillary refill takes less than 2 seconds.  Psychiatric: She has a normal mood and affect. Her behavior is normal.  Vitals reviewed.  Dg Wrist Complete Right  Result Date: 09/08/2017 CLINICAL DATA:  RIGHT wrist pain.  Initial encounter. EXAM: RIGHT WRIST - COMPLETE 3+ VIEW COMPARISON:  None. FINDINGS: There is no acute fracture, subluxation or dislocation. TFCC calcifications noted. No focal bony lesions are present. No soft tissue abnormalities are identified. IMPRESSION: 1. No evidence of acute abnormality 2. TFCC chondrocalcinosis. Electronically Signed   By: Harmon PierJeffrey  Hu M.D.   On: 09/08/2017 14:38     ASSESSMENT & PLAN:  Danielle DroneBrenda was seen today for hand pain.  Diagnoses and all orders for this visit:  Right wrist pain -     DG Wrist Complete Right; Future -     diclofenac (VOLTAREN) 75 MG EC tablet; Take 1 tablet (75 mg total) by mouth 2 (two) times daily for 5 days. After 5 days take as needed. -     Wrist splint  Periarthritis of right wrist    Patient Instructions       IF you received an x-ray today, you will receive an invoice from [facility]. Please contact [facility] at [phone] with questions or concerns regarding your invoice.   IF you received labwork today, you will receive an invoice from [facility]. Please contact [facility] at [phone] with questions or concerns regarding your invoice.   Our billing staff will not be able to assist you with questions regarding bills from these companies.  You will be contacted with the lab results as soon as they are available. The fastest way to get your results is to activate your My Chart account. Instructions are located on the last page of this paperwork. If you have not heard from us regarding the results in 2 weeks, please contact this office.     Wrist Pain, Adult There are many things that can cause wrist pain. Some common causes  include:  An injury to the wrist area.  Overuse of the joint.  A condition that causes too much pressure to be put on a nerve in the wrist (carpal tunnel syndrome).  Wear and tear of the joints that happens as a person gets older (osteoarthritis).  Other types of arthritis.  Sometimes, the cause of wrist pain is not known. Often, the pain goes away when you follow your doctor's instructions for helping pain at home, such as resting or icing your wrist. If your wrist  pain does not go away, it is important to tell your doctor. Follow these instructions at home:  Rest the wrist area for 48 hours or more, or as long as told by your doctor.  If a splint or elastic bandage has been put on your wrist, use it as told by your doctor. ? Take off the splint or bandage only as told by your doctor. ? Loosen the splint or bandage if your fingers tingle, lose feeling (get numb), or turn cold or blue.  If directed, apply ice to the injured area: ? If you have a removable splint or elastic bandage, remove it as told by your doctor. ? Put ice in a plastic bag. ? Place a towel between your skin and the bag or between your splint or bandage and the bag. ? Leave the ice on for 20 minutes, 2-3 times a day.  Keep your arm raised (elevated) above the level of your heart while you are sitting or lying down.  Take over-the-counter and prescription medicines only as told by your doctor.  Keep all follow-up visits as told by your doctor. This is important. Contact a doctor if:  You have a sudden sharp pain in the wrist, hand, or arm that is different or new.  The swelling or bruising on your wrist or hand gets worse.  Your skin becomes red, gets a rash, or has open sores.  Your pain does not get better or it gets worse. Get help right away if:  You lose feeling in your fingers or hand.  Your fingers turn white, very red, or cold and blue.  You cannot move your fingers.  You have a fever or  chills. This information is not intended to replace advice given to you by your health care provider. Make sure you discuss any questions you have with your health care provider. Document Released: 09/10/2007 Document Revised: 10/18/2015 Document Reviewed: 10/11/2015 Elsevier Interactive Patient Education  2018 [employer]     Edwina Barth, MD [facility]

## 2017-09-08 NOTE — Patient Instructions (Addendum)
     IF you received an x-ray today, you will receive an invoice from [facility]. Please contact [facility] at [phone] with questions or concerns regarding your invoice.   IF you received labwork today, you will receive an invoice from [facility]. Please contact [facility] at [phone] with questions or concerns regarding your invoice.   Our billing staff will not be able to assist you with questions regarding bills from these companies.  You will be contacted with the lab results as soon as they are available. The fastest way to get your results is to activate your My Chart account. Instructions are located on the last page of this paperwork. If you have not heard from us regarding the results in 2 weeks, please contact this office.     Wrist Pain, Adult There are many things that can cause wrist pain. Some common causes include:  An injury to the wrist area.  Overuse of the joint.  A condition that causes too much pressure to be put on a nerve in the wrist (carpal tunnel syndrome).  Wear and tear of the joints that happens as a person gets older (osteoarthritis).  Other types of arthritis.  Sometimes, the cause of wrist pain is not known. Often, the pain goes away when you follow your doctor's instructions for helping pain at home, such as resting or icing your wrist. If your wrist pain does not go away, it is important to tell your doctor. Follow these instructions at home:  Rest the wrist area for 48 hours or more, or as long as told by your doctor.  If a splint or elastic bandage has been put on your wrist, use it as told by your doctor. ? Take off the splint or bandage only as told by your doctor. ? Loosen the splint or bandage if your fingers tingle, lose feeling (get numb), or turn cold or blue.  If directed, apply ice to the injured area: ? If you have a removable splint or elastic bandage, remove it as told by your doctor. ? Put ice in a plastic  bag. ? Place a towel between your skin and the bag or between your splint or bandage and the bag. ? Leave the ice on for 20 minutes, 2-3 times a day.  Keep your arm raised (elevated) above the level of your heart while you are sitting or lying down.  Take over-the-counter and prescription medicines only as told by your doctor.  Keep all follow-up visits as told by your doctor. This is important. Contact a doctor if:  You have a sudden sharp pain in the wrist, hand, or arm that is different or new.  The swelling or bruising on your wrist or hand gets worse.  Your skin becomes red, gets a rash, or has open sores.  Your pain does not get better or it gets worse. Get help right away if:  You lose feeling in your fingers or hand.  Your fingers turn white, very red, or cold and blue.  You cannot move your fingers.  You have a fever or chills. This information is not intended to replace advice given to you by your health care provider. Make sure you discuss any questions you have with your health care provider. Document Released: 09/10/2007 Document Revised: 10/18/2015 Document Reviewed: 10/11/2015 Elsevier Interactive Patient Education  2018 [employer] Inc[employer]  

## 2017-09-09 ENCOUNTER — Telehealth: Payer: Self-pay

## 2017-09-09 NOTE — Telephone Encounter (Signed)
Copied from CRM [phone]. Topic: General - Other >> Sep 09, 2017 10:49 AM Percival SpanishKennedy, Cheryl W wrote:  Pt call to say the below med is causing her some nausea and tiredness and would like a call back   DICLOFENAC   [phone]

## 2017-09-09 NOTE — Telephone Encounter (Signed)
Pt advised.

## 2017-09-09 NOTE — Telephone Encounter (Signed)
Any alternatives you suggest to this medication?

## 2017-09-09 NOTE — Telephone Encounter (Signed)
OTC Advil and/or Tylenol.

## 2018-05-03 ENCOUNTER — Ambulatory Visit: Payer: Self-pay | Admitting: *Deleted

## 2018-05-03 NOTE — Telephone Encounter (Signed)
Dizzy spell Saturday morning- nothing since- patient has been having frequent headache/discomfort. Patient thinks her BP may have caused the symptom. She does not have a way to check it and is calling for an appointment.  Reason for Disposition . [1] MILD dizziness (e.g., vertigo; walking normally) AND [2] has NOT been evaluated by physician for this  Answer Assessment - Initial Assessment Questions 1. DESCRIPTION: "Describe your dizziness."     Saturday- dizzy- head spinning 2. LIGHTHEADED: "Do you feel lightheaded?" (e.g., somewhat faint, woozy, weak upon standing)     Woozy- spinning 3. VERTIGO: "Do you feel like either you or the room is spinning or tilting?" (i.e. vertigo)     Room was spiining 4. SEVERITY: "How bad is it?"  "Do you feel like you are going to faint?" "Can you stand and walk?"   - MILD - walking normally   - MODERATE - interferes with normal activities (e.g., work, school)    - SEVERE - unable to stand, requires support to walk, feels like passing out now.      bad enough patient had to sit- lasted 30 seconds and went away 5. ONSET:  "When did the dizziness begin?"     Saturday- only occurance 6. AGGRAVATING FACTORS: "Does anything make it worse?" (e.g., standing, change in head position)     n/a 7. HEART RATE: "Can you tell me your heart rate?" "How many beats in 15 seconds?"  (Note: not all patients can do this)       No- all in head 8. CAUSE: "What do you think is causing the dizziness?"     Not sure- patient wants BP checked 9. RECURRENT SYMPTOM: "Have you had dizziness before?" If so, ask: "When was the last time?" "What happened that time?"     never 10. OTHER SYMPTOMS: "Do you have any other symptoms?" (e.g., fever, chest pain, vomiting, diarrhea, bleeding)       Frequent headaches 11. PREGNANCY: "Is there any chance you are pregnant?" "When was your last menstrual period?"       n/a  Protocols used: DIZZINESS - VERTIGO-A-AH, DIZZINESS -  [facility]

## 2018-05-04 ENCOUNTER — Other Ambulatory Visit: Payer: Self-pay

## 2018-05-04 ENCOUNTER — Ambulatory Visit (INDEPENDENT_AMBULATORY_CARE_PROVIDER_SITE_OTHER): Payer: Federal, State, Local not specified - PPO | Admitting: Family Medicine

## 2018-05-04 ENCOUNTER — Encounter: Payer: Self-pay | Admitting: Family Medicine

## 2018-05-04 VITALS — BP 136/82 | HR 80 | Temp 98.0°F | Resp 16 | Wt 194.0 lb

## 2018-05-04 DIAGNOSIS — G8929 Other chronic pain: Secondary | ICD-10-CM

## 2018-05-04 DIAGNOSIS — R42 Dizziness and giddiness: Secondary | ICD-10-CM | POA: Diagnosis not present

## 2018-05-04 DIAGNOSIS — R51 Headache: Secondary | ICD-10-CM | POA: Diagnosis not present

## 2018-05-04 DIAGNOSIS — H539 Unspecified visual disturbance: Secondary | ICD-10-CM

## 2018-05-04 DIAGNOSIS — R5383 Other fatigue: Secondary | ICD-10-CM | POA: Diagnosis not present

## 2018-05-04 NOTE — Patient Instructions (Signed)
° ° ° °  If you have lab work done today you will be contacted with your lab results within the next 2 weeks.  If you have not heard from us then please contact us. The fastest way to get your results is to register for My Chart. ° ° °IF you received an x-ray today, you will receive an invoice from [facility]. Please contact [facility] at [phone] with questions or concerns regarding your invoice.  ° °IF you received labwork today, you will receive an invoice from [facility]. Please contact [facility] at [phone] with questions or concerns regarding your invoice.  ° °Our billing staff will not be able to assist you with questions regarding bills from these companies. ° °You will be contacted with the lab results as soon as they are available. The fastest way to get your results is to activate your My Chart account. Instructions are located on the last page of this paperwork. If you have not heard from us regarding the results in 2 weeks, please contact this office. °  ° ° ° °

## 2018-05-04 NOTE — Progress Notes (Signed)
1/28/20205:19 PM  Danielle Kirby 1946-03-05, 73 y.o. female [id number]  Chief Complaint  Patient presents with  . Dizziness    pt states she has been having pain in the head area for months. Pt states the dizzy spell started Sat. and lasted about 1 min. Pt states she had to sit down because the room was spinning     HPI:   Patient is a 73 y.o. female who presents today for dizziness  Saturday dizzy episode that lasted about 30 sec while she was standing talking with her grandson Room was spinning This has never happened before  She for the past several months she started in the right back of her head as pain, daily, now more uncomfortable, moves around  No nausea or vomiting Reports changes to vision, intermittent loss of visual field, such as she notices she cant see her grandson's eye, but then she sits down for a while and it resolves Has h/o macular degeneration, will be seeing eye doctor in feb No focal weakness No speech changes  No night sweats, fever or chills No sense of malaise Has been a bit more fatigued for past 2 days No nasal congestion, no ear pain Sometimes has ringing, static noise Has h/o palpitations and heart murmur - needs to take abx before dental procedures Never been told she has afib No chest pain, coughing, SOB No abd pain, no changes in BM No dysuria, hematuria  Fall Risk  05/04/2018 09/08/2017 08/07/2017 07/31/2017 05/31/2016  Falls in the past year? 0 No No No No  Comment - - - - -  Injury with Fall? 0 - - - -     Depression screen [facility] 2/9 05/04/2018 09/08/2017 08/07/2017  Decreased Interest 0 0 0  Down, Depressed, Hopeless 0 0 0  PHQ - 2 Score 0 0 0    Allergies  Allergen Reactions  . Penicillins     REACTION: hives    Prior to Admission medications   Medication Sig Start Date End Date Taking? Authorizing Provider  OVER THE COUNTER MEDICATION daily.   Yes [provider]    Past Medical History:  Diagnosis Date  . Allergy     . Arthritis   . Cataract   . Heart murmur     Past Surgical History:  Procedure Laterality Date  . BREAST SURGERY    . COSMETIC SURGERY    . EYE SURGERY    . TUBAL LIGATION      Social History   Tobacco Use  . Smoking status: Never Smoker  . Smokeless tobacco: Never Used  Substance Use Topics  . Alcohol use: No    Alcohol/week: 0.0 standard drinks    History reviewed. No pertinent family history.  ROS Per hpi  OBJECTIVE:  Blood pressure 136/82, pulse 80, temperature 98 F (36.7 C), temperature source Oral, resp. rate 16, weight 194 lb (88 kg), SpO2 94 %. Body mass index is 31.08 kg/m.   Physical Exam Vitals signs and nursing note reviewed.  Constitutional:      Appearance: She is well-developed.  HENT:     Head: Normocephalic and atraumatic.     Right Ear: Hearing, tympanic membrane, ear canal and external ear normal.     Left Ear: Hearing, tympanic membrane, ear canal and external ear normal.     Mouth/Throat:     Comments: Temporal arteries pulses palpated, non tender, not endurated Eyes:     Extraocular Movements: Extraocular movements intact.  Conjunctiva/sclera: Conjunctivae normal.     Pupils: Pupils are equal, round, and reactive to light.     Comments: Normal peripheral vision   Neck:     Musculoskeletal: Neck supple.     Vascular: No carotid bruit.  Cardiovascular:     Rate and Rhythm: Normal rate and regular rhythm.     Heart sounds: Murmur present. No friction rub. No gallop.   Pulmonary:     Effort: Pulmonary effort is normal.     Breath sounds: Normal breath sounds. No wheezing or rales.  Musculoskeletal:     Right lower leg: No edema.     Left lower leg: No edema.  Lymphadenopathy:     Cervical: No cervical adenopathy.  Skin:    General: Skin is warm and dry.  Neurological:     Mental Status: She is alert and oriented to person, place, and time.     Cranial Nerves: Cranial nerves are intact.     Motor: Motor function is intact.  No weakness, tremor or pronator drift.     Coordination: Coordination is intact. Romberg sign negative.     Gait: Gait normal.     Deep Tendon Reflexes: Reflexes are normal and symmetric.    My interpretation of EKG:  NSR, HR 71, no st changes  ASSESSMENT and PLAN  1. Dizziness Concerning new onset of headaches, dizziness and vision changes. MRI to further eval. RTC precautions given. Labs per below - CBC - Comprehensive metabolic panel - EKG 12-Lead - MR Brain W Wo Contrast; Future  2. Chronic nonintractable headache, unspecified headache type - Sedimentation Rate - MR Brain W Wo Contrast; Future  3. Fatigue, unspecified type - TSH  4. Vision changes - MR Brain W Wo Contrast; Future  Return for after MRI.    Myles Lipps, MD [facility] at [facility] [address] [place], [place] [address] Ph.  [phone] Fax [phone]

## 2018-05-05 LAB — COMPREHENSIVE METABOLIC PANEL
ALT: 11 IU/L (ref 0–32)
AST: 15 IU/L (ref 0–40)
Albumin/Globulin Ratio: 1.4 (ref 1.2–2.2)
Albumin: 4.2 g/dL (ref 3.7–4.7)
Alkaline Phosphatase: 90 IU/L (ref 39–117)
BUN/Creatinine Ratio: 15 (ref 12–28)
BUN: 12 mg/dL (ref 8–27)
Bilirubin Total: 0.3 mg/dL (ref 0.0–1.2)
CO2: 22 mmol/L (ref 20–29)
Calcium: 10 mg/dL (ref 8.7–10.3)
Chloride: 102 mmol/L (ref 96–106)
Creatinine, Ser: 0.78 mg/dL (ref 0.57–1.00)
GFR calc Af Amer: 88 mL/min/{1.73_m2} (ref >59)
GFR calc non Af Amer: 76 mL/min/{1.73_m2} (ref >59)
Globulin, Total: 3 g/dL (ref 1.5–4.5)
Glucose: 88 mg/dL (ref 65–99)
Potassium: 4.4 mmol/L (ref 3.5–5.2)
Sodium: 139 mmol/L (ref 134–144)
Total Protein: 7.2 g/dL (ref 6.0–8.5)

## 2018-05-05 LAB — TSH: TSH: 0.945 u[IU]/mL (ref 0.450–4.500)

## 2018-05-05 LAB — CBC
Hematocrit: 35.8 % (ref 34.0–46.6)
Hemoglobin: 11.6 g/dL (ref 11.1–15.9)
MCH: 26.4 pg — ABNORMAL LOW (ref 26.6–33.0)
MCHC: 32.4 g/dL (ref 31.5–35.7)
MCV: 81 fL (ref 79–97)
Platelets: 190 10*3/uL (ref 150–450)
RBC: 4.4 x10E6/uL (ref 3.77–5.28)
RDW: 13.3 % (ref 11.7–15.4)
WBC: 3.7 10*3/uL (ref 3.4–10.8)

## 2018-05-05 LAB — SEDIMENTATION RATE: Sed Rate: 23 mm/hr (ref 0–40)

## 2018-08-11 ENCOUNTER — Encounter: Payer: Federal, State, Local not specified - PPO | Admitting: Emergency Medicine

## 2019-03-01 ENCOUNTER — Encounter: Payer: Self-pay | Admitting: Emergency Medicine

## 2019-03-01 ENCOUNTER — Ambulatory Visit (INDEPENDENT_AMBULATORY_CARE_PROVIDER_SITE_OTHER): Payer: Federal, State, Local not specified - PPO | Admitting: Emergency Medicine

## 2019-03-01 ENCOUNTER — Other Ambulatory Visit: Payer: Self-pay

## 2019-03-01 VITALS — BP 129/84 | HR 76 | Temp 98.6°F | Ht 67.0 in | Wt 199.8 lb

## 2019-03-01 DIAGNOSIS — Z1329 Encounter for screening for other suspected endocrine disorder: Secondary | ICD-10-CM

## 2019-03-01 DIAGNOSIS — Z0001 Encounter for general adult medical examination with abnormal findings: Secondary | ICD-10-CM

## 2019-03-01 DIAGNOSIS — R32 Unspecified urinary incontinence: Secondary | ICD-10-CM

## 2019-03-01 DIAGNOSIS — Z13220 Encounter for screening for lipoid disorders: Secondary | ICD-10-CM | POA: Diagnosis not present

## 2019-03-01 DIAGNOSIS — Z130 Encounter for screening for diseases of the blood and blood-forming organs and certain disorders involving the immune mechanism: Secondary | ICD-10-CM | POA: Diagnosis not present

## 2019-03-01 DIAGNOSIS — N3000 Acute cystitis without hematuria: Secondary | ICD-10-CM | POA: Diagnosis not present

## 2019-03-01 DIAGNOSIS — R8290 Unspecified abnormal findings in urine: Secondary | ICD-10-CM | POA: Diagnosis not present

## 2019-03-01 DIAGNOSIS — Z0000 Encounter for general adult medical examination without abnormal findings: Secondary | ICD-10-CM

## 2019-03-01 DIAGNOSIS — Z13228 Encounter for screening for other metabolic disorders: Secondary | ICD-10-CM

## 2019-03-01 LAB — POCT URINALYSIS DIP (MANUAL ENTRY)
Bilirubin, UA: NEGATIVE
Glucose, UA: NEGATIVE mg/dL
Ketones, POC UA: NEGATIVE mg/dL
Nitrite, UA: NEGATIVE
Spec Grav, UA: 1.02 (ref 1.010–1.025)
Urobilinogen, UA: 0.2 E.U./dL
pH, UA: 7 (ref 5.0–8.0)

## 2019-03-01 MED ORDER — SULFAMETHOXAZOLE-TRIMETHOPRIM 800-160 MG PO TABS: 1.0000 | ORAL_TABLET | Freq: Two times a day (BID) | ORAL | 0 refills | Status: AC

## 2019-03-01 NOTE — Patient Instructions (Addendum)
If you have lab work done today you will be contacted with your lab results within the next 2 weeks.  If you have not heard from [place] then please contact us. The fastest way to get your results is to register for My Chart.   IF you received an x-ray today, you will receive an invoice from [facility]. Please contact [facility] at [phone] with questions or concerns regarding your invoice.   IF you received labwork today, you will receive an invoice from [facility]. Please contact [facility] at [phone] with questions or concerns regarding your invoice.   Our billing staff will not be able to assist you with questions regarding bills from these companies.  You will be contacted with the lab results as soon as they are available. The fastest way to get your results is to activate your My Chart account. Instructions are located on the last page of this paperwork. If you have not heard from [place] regarding the results in 2 weeks, please contact this office.    We recommend that you schedule a mammogram for breast cancer screening. Typically, you do not need a referral to do this. Please contact a local imaging center to schedule your mammogram.  [facility] - [phone]  *ask for the Radiology Department [facility] - [phone] or [phone]  [facility] - [phone] [place] [phone] [facility] - [phone]  *ask for the [facility] - [phone]  *ask for the Radiology Department [facility] - [phone]  *ask for the [facility] - [phone]  Health Maintenance, Female Adopting a healthy lifestyle and getting preventive care are important in promoting health and wellness. Ask your health care provider about:  The right schedule for you to have regular tests and exams.  Things you  can do on your own to prevent diseases and keep yourself healthy. What should I know about diet, weight, and exercise? Eat a healthy diet   Eat a diet that includes plenty of vegetables, fruits, low-fat dairy products, and lean protein.  Do not eat a lot of foods that are high in solid fats, added sugars, or sodium. Maintain a healthy weight Body mass index (BMI) is used to identify weight problems. It estimates body fat based on height and weight. Your health care provider can help determine your BMI and help you achieve or maintain a healthy weight. Get regular exercise Get regular exercise. This is one of the most important things you can do for your health. Most adults should:  Exercise for at least 150 minutes each week. The exercise should increase your heart rate and make you sweat (moderate-intensity exercise).  Do strengthening exercises at least twice a week. This is in addition to the moderate-intensity exercise.  Spend less time sitting. Even light physical activity can be beneficial. Watch cholesterol and blood lipids Have your blood tested for lipids and cholesterol at 73 years of age, then have this test every 5 years. Have your cholesterol levels checked more often if:  Your lipid or cholesterol levels are high.  You are older than 73 years of age.  You are at high risk for heart disease. What should I know about cancer screening? Depending on your health history and family history, you may need to have cancer screening at various ages. This may include screening for:  Breast cancer.  Cervical cancer.  Colorectal cancer.  Skin cancer.  Lung cancer. What should I know about heart disease, diabetes, and high blood pressure? Blood pressure and heart disease  High blood pressure causes heart disease and increases the risk of stroke. This is more likely to develop in people who have high blood pressure readings, are of African descent, or are overweight.  Have  your blood pressure checked: ? Every 3-5 years if you are 62-85 years of age. ? Every year if you are 44 years old or older. Diabetes Have regular diabetes screenings. This checks your fasting blood sugar level. Have the screening done:  Once every three years after age 19 if you are at a normal weight and have a low risk for diabetes.  More often and at a younger age if you are overweight or have a high risk for diabetes. What should I know about preventing infection? Hepatitis B If you have a higher risk for hepatitis B, you should be screened for this virus. Talk with your health care provider to find out if you are at risk for hepatitis B infection. Hepatitis C Testing is recommended for:  Everyone born from [birth year] through 1965.  Anyone with known risk factors for hepatitis C. Sexually transmitted infections (STIs)  Get screened for STIs, including gonorrhea and chlamydia, if: ? You are sexually active and are younger than 73 years of age. ? You are older than 73 years of age and your health care provider tells you that you are at risk for this type of infection. ? Your sexual activity has changed since you were last screened, and you are at increased risk for chlamydia or gonorrhea. Ask your health care provider if you are at risk.  Ask your health care provider about whether you are at high risk for HIV. Your health care provider may recommend a prescription medicine to help prevent HIV infection. If you choose to take medicine to prevent HIV, you should first get tested for HIV. You should then be tested every 3 months for as long as you are taking the medicine. Pregnancy  If you are about to stop having your period (premenopausal) and you may become pregnant, seek counseling before you get pregnant.  Take 400 to 800 micrograms (mcg) of folic acid every day if you become pregnant.  Ask for birth control (contraception) if you want to prevent pregnancy. Osteoporosis and  menopause Osteoporosis is a disease in which the bones lose minerals and strength with aging. This can result in bone fractures. If you are 51 years old or older, or if you are at risk for osteoporosis and fractures, ask your health care provider if you should:  Be screened for bone loss.  Take a calcium or vitamin D supplement to lower your risk of fractures.  Be given hormone replacement therapy (HRT) to treat symptoms of menopause. Follow these instructions at home: Lifestyle  Do not use any products that contain nicotine or tobacco, such as cigarettes, e-cigarettes, and chewing tobacco. If you need help quitting, ask your health care provider.  Do not use street drugs.  Do not share needles.  Ask your health care provider for help if you need support or information about quitting drugs. Alcohol use  Do not drink alcohol if: ? Your health care provider tells you not to drink. ? You are pregnant, may be pregnant, or are planning to become pregnant.  If you drink alcohol: ? Limit how much you use to 0-1 drink a day. ? Limit  intake if you are breastfeeding.  Be aware of how much alcohol is in your drink. In the [place] one drink equals one 12 oz bottle of beer (355 mL), one 5 oz glass of wine (148 mL), or one 1 oz glass of hard liquor (44 mL). General instructions  Schedule regular health, dental, and eye exams.  Stay current with your vaccines.  Tell your health care provider if: ? You often feel depressed. ? You have ever been abused or do not feel safe at home. Summary  Adopting a healthy lifestyle and getting preventive care are important in promoting health and wellness.  Follow your health care provider's instructions about healthy diet, exercising, and getting tested or screened for diseases.  Follow your health care provider's instructions on monitoring your cholesterol and blood pressure. This information is not intended to replace advice given to you by your  health care provider. Make sure you discuss any questions you have with your health care provider. Document Released: 10/07/2010 Document Revised: 03/17/2018 Document Reviewed: 03/17/2018 Elsevier Patient Education  2020 [employer].

## 2019-03-01 NOTE — Progress Notes (Signed)
Danielle Kirby 73 y.o.   Chief Complaint  Patient presents with  . Annual Exam    HISTORY OF PRESENT ILLNESS: This is a 73 y.o. female here for her annual exam. No chronic medical problems on no chronic medications. Has breast implants with plans to remove them.  At surgeon's office on 02/17/2019.  Blood pressure then 150/80.  Advised to follow-up with PCP. Non-smoker no EtOH abuser. Complaining of 40-month history of odor to her urine and unable to hold urine with occasional incontinence. No other complaints or medical concerns. Has had colonoscopies in the past.  HPI   Prior to Admission medications   Medication Sig Start Date End Date Taking? Authorizing Provider  OVER THE COUNTER MEDICATION daily.   Yes [provider]    Allergies  Allergen Reactions  . Penicillins     REACTION: hives    Patient Active Problem List   Diagnosis Date Noted  . 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 Allergy   . Arthritis   . Cataract   . Heart murmur     Past Surgical History:  Procedure Laterality Date  . BREAST SURGERY    . COSMETIC SURGERY    . EYE SURGERY    . TUBAL LIGATION      Social History   Socioeconomic History  . Marital status: Single    Spouse name: Not on file  . Number of children: Not on file  . Years of education: Not on file  . Highest education level: Not on file  Occupational History  . Not on file  Social Needs  . Financial resource strain: Not on file  . Food insecurity    Worry: Not on file    Inability: Not on file  . Transportation needs    Medical: Not on file    Non-medical: Not on file  Tobacco Use  . Smoking status: Never Smoker  . Smokeless tobacco: Never Used  Substance and Sexual Activity  . Alcohol use: No    Alcohol/week: 0.0 standard drinks  . Drug use: No  . Sexual  activity: Yes    Birth control/protection: None  Lifestyle  . Physical activity    Days per week: Not on file    Minutes per session: Not on file  . Stress: Not on file  Relationships  . Social Musician on phone: Not on file    Gets together: Not on file    Attends religious service: Not on file    Active member of club or organization: Not on file    Attends meetings of clubs or organizations: Not on file    Relationship status: Not on file  . Intimate partner violence    Fear of current or ex partner: Not on file    Emotionally abused: Not on file    Physically abused: Not on file    Forced sexual activity: Not on file  Other Topics Concern  . Not on file  Social History Narrative  . Not on file    Family History  Problem Relation Age of Onset  . Diabetes Mother   . Hypertension Father      Review of Systems  Constitutional: Negative.  Negative for chills and fever.  HENT: Negative.  Negative for congestion and sore throat.   Eyes: Negative.   Respiratory:  Negative.  Negative for cough and shortness of breath.   Cardiovascular: Negative.  Negative for chest pain and palpitations.  Gastrointestinal: Negative.  Negative for abdominal pain, blood in stool, diarrhea, melena, nausea and vomiting.  Genitourinary: Negative.  Negative for dysuria and hematuria.  Musculoskeletal: Negative.  Negative for back pain, myalgias and neck pain.  Skin: Negative.  Negative for rash.  Neurological: Negative.  Negative for dizziness, sensory change, focal weakness and headaches.  Endo/Heme/Allergies: Negative.   All other systems reviewed and are negative.  Vitals:   03/01/19 0906  BP: 129/84  Pulse: 76  Temp: 98.6 F (37 C)     Physical Exam Vitals signs reviewed.  Constitutional:      Appearance: Normal appearance.  HENT:     Head: Normocephalic.  Eyes:     Extraocular Movements: Extraocular movements intact.     Conjunctiva/sclera: Conjunctivae normal.      Pupils: Pupils are equal, round, and reactive to light.  Neck:     Musculoskeletal: Normal range of motion and neck supple.  Cardiovascular:     Rate and Rhythm: Normal rate and regular rhythm.     Pulses: Normal pulses.     Heart sounds: Normal heart sounds.  Pulmonary:     Effort: Pulmonary effort is normal.     Breath sounds: Normal breath sounds.  Abdominal:     General: There is no distension.     Palpations: Abdomen is soft. There is no mass.     Tenderness: There is no abdominal tenderness. There is no right CVA tenderness or left CVA tenderness.  Musculoskeletal: Normal range of motion.     Right lower leg: No edema.     Left lower leg: No edema.  Skin:    General: Skin is warm and dry.     Capillary Refill: Capillary refill takes less than 2 seconds.  Neurological:     General: No focal deficit present.     Mental Status: She is alert and oriented to person, place, and time.  Psychiatric:        Mood and Affect: Mood normal.        Behavior: Behavior normal.    Results for orders placed or performed in visit on 03/01/19 (from the past 24 hour(s))  POCT urinalysis dipstick     Status: Abnormal   Collection Time: 03/01/19  9:59 AM  Result Value Ref Range   Color, UA yellow yellow   Clarity, UA clear clear   Glucose, UA negative negative mg/dL   Bilirubin, UA negative negative   Ketones, POC UA negative negative mg/dL   Spec Grav, UA 6.629 4.765 - 1.025   Blood, UA trace-lysed (A) negative   pH, UA 7.0 5.0 - 8.0   Protein Ur, POC trace (A) negative mg/dL   Urobilinogen, UA 0.2 0.2 or 1.0 E.U./dL   Nitrite, UA Negative Negative   Leukocytes, UA Small (1+) (A) Negative      ASSESSMENT & PLAN: Danielle Kirby was seen today for annual exam.  Diagnoses and all orders for this visit:  Routine general medical examination at a health care facility  Screening for deficiency anemia -     CBC with Differential  Screening for lipoid disorders -     Lipid panel   Screening for endocrine, metabolic and immunity disorder -     Comprehensive metabolic panel -     Hemoglobin A1c  Malodorous urine -     POCT urinalysis dipstick -  Urine culture -     sulfamethoxazole-trimethoprim (BACTRIM DS) 800-160 MG tablet; Take 1 tablet by mouth 2 (two) times daily for 7 days.  Urinary incontinence, unspecified type -     Ambulatory referral to Urology  Acute cystitis without hematuria     Patient Instructions       If you have lab work done today you will be contacted with your lab results within the next 2 weeks.  If you have not heard from [place] then please contact us. The fastest way to get your results is to register for My Chart.   IF you received an x-ray today, you will receive an invoice from [facility]. Please contact [facility] at [phone] with questions or concerns regarding your invoice.   IF you received labwork today, you will receive an invoice from [place]. Please contact [facility] at [phone] with questions or concerns regarding your invoice.   Our billing staff will not be able to assist you with questions regarding bills from these companies.  You will be contacted with the lab results as soon as they are available. The fastest way to get your results is to activate your My Chart account. Instructions are located on the last page of this paperwork. If you have not heard from [place] regarding the results in 2 weeks, please contact this office.    We recommend that you schedule a mammogram for breast cancer screening. Typically, you do not need a referral to do this. Please contact a local imaging center to schedule your mammogram.  [facility] - [phone]  *ask for the Radiology Department [facility]) - [phone] or [phone]  [facility] - [phone] [facility] - [phone] [facility] - [phone]  *ask for the  Radiology Department [facility] - [phone]  *ask for the Radiology Department [facility] - [phone]  *ask for the Mammography Department [facility] - [phone]  Health Maintenance, Female Adopting a healthy lifestyle and getting preventive care are important in promoting health and wellness. Ask your health care provider about:  The right schedule for you to have regular tests and exams.  Things you can do on your own to prevent diseases and keep yourself healthy. What should I know about diet, weight, and exercise? Eat a healthy diet   Eat a diet that includes plenty of vegetables, fruits, low-fat dairy products, and lean protein.  Do not eat a lot of foods that are high in solid fats, added sugars, or sodium. Maintain a healthy weight Body mass index (BMI) is used to identify weight problems. It estimates body fat based on height and weight. Your health care provider can help determine your BMI and help you achieve or maintain a healthy weight. Get regular exercise Get regular exercise. This is one of the most important things you can do for your health. Most adults should:  Exercise for at least 150 minutes each week. The exercise should increase your heart rate and make you sweat (moderate-intensity exercise).  Do strengthening exercises at least twice a week. This is in addition to the moderate-intensity exercise.  Spend less time sitting. Even light physical activity can be beneficial. Watch cholesterol and blood lipids Have your blood tested for lipids and cholesterol at 73 years of age, then have this test every 5 years. Have your cholesterol levels checked more often if:  Your lipid or cholesterol levels are high.  You are older than 73 years of age.  You are at high risk for heart disease. What should I know about cancer screening? Depending on your health history and family history, you may need to have cancer  screening at various ages. This may include screening for:  Breast cancer.  Cervical cancer.  Colorectal cancer.  Skin cancer.  Lung cancer. What should I know about heart disease, diabetes, and high blood pressure? Blood pressure and heart disease  High blood pressure causes heart disease and increases the risk of stroke. This is more likely to develop in people who have high blood pressure readings, are of African descent, or are overweight.  Have your blood pressure checked: ? Every 3-5 years if you are 6818-73 years of age. ? Every year if you are 73 years old or older. Diabetes Have regular diabetes screenings. This checks your fasting blood sugar level. Have the screening done:  Once every three years after age 73 if you are at a normal weight and have a low risk for diabetes.  More often and at a younger age if you are overweight or have a high risk for diabetes. What should I know about preventing infection? Hepatitis B If you have a higher risk for hepatitis B, you should be screened for this virus. Talk with your health care provider to find out if you are at risk for hepatitis B infection. Hepatitis C Testing is recommended for:  Everyone born from [birth year]45 through 1965.  Anyone with known risk factors for hepatitis C. Sexually transmitted infections (STIs)  Get screened for STIs, including gonorrhea and chlamydia, if: ? You are sexually active and are younger than 73 years of age. ? You are older than 73 years of age and your health care provider tells you that you are at risk for this type of infection. ? Your sexual activity has changed since you were last screened, and you are at increased risk for chlamydia or gonorrhea. Ask your health care provider if you are at risk.  Ask your health care provider about whether you are at high risk for HIV. Your health care provider may recommend a prescription medicine to help prevent HIV infection. If you choose to take  medicine to prevent HIV, you should first get tested for HIV. You should then be tested every 3 months for as long as you are taking the medicine. Pregnancy  If you are about to stop having your period (premenopausal) and you may become pregnant, seek counseling before you get pregnant.  Take 400 to 800 micrograms (mcg) of folic acid every day if you become pregnant.  Ask for birth control (contraception) if you want to prevent pregnancy. Osteoporosis and menopause Osteoporosis is a disease in which the bones lose minerals and strength with aging. This can result in bone fractures. If you are 49 years old or older, or if you are at risk for osteoporosis and fractures, ask your health care provider if you should:  Be screened for bone loss.  Take a calcium or vitamin D supplement to lower your risk of fractures.  Be given hormone replacement therapy (HRT) to treat symptoms of menopause. Follow these instructions at home: Lifestyle  Do not use any products that contain nicotine or tobacco, such as cigarettes, e-cigarettes, and chewing tobacco. If you need help quitting, ask your health care provider.  Do not use street drugs.  Do not share needles.  Ask your health care provider for help if you need support  or information about quitting drugs. Alcohol use  Do not drink alcohol if: ? Your health care provider tells you not to drink. ? You are pregnant, may be pregnant, or are planning to become pregnant.  If you drink alcohol: ? Limit how much you use to 0-1 drink a day. ? Limit intake if you are breastfeeding.  Be aware of how much alcohol is in your drink. In the [place] one drink equals one 12 oz bottle of beer (355 mL), one 5 oz glass of wine (148 mL), or one 1 oz glass of hard liquor (44 mL). General instructions  Schedule regular health, dental, and eye exams.  Stay current with your vaccines.  Tell your health care provider if: ? You often feel depressed. ? You have  ever been abused or do not feel safe at home. Summary  Adopting a healthy lifestyle and getting preventive care are important in promoting health and wellness.  Follow your health care provider's instructions about healthy diet, exercising, and getting tested or screened for diseases.  Follow your health care provider's instructions on monitoring your cholesterol and blood pressure. This information is not intended to replace advice given to you by your health care provider. Make sure you discuss any questions you have with your health care provider. Document Released: 10/07/2010 Document Revised: 03/17/2018 Document Reviewed: 03/17/2018 Elsevier Patient Education  2020 Elsevier Inc.      Agustina Caroli, MD [facility]

## 2019-03-02 ENCOUNTER — Encounter: Payer: Self-pay | Admitting: Emergency Medicine

## 2019-03-02 LAB — CBC WITH DIFFERENTIAL/PLATELET
Basophils Absolute: 0 10*3/uL (ref 0.0–0.2)
Basos: 1 %
EOS (ABSOLUTE): 0.1 10*3/uL (ref 0.0–0.4)
Eos: 4 %
Hematocrit: 39.2 % (ref 34.0–46.6)
Hemoglobin: 12.6 g/dL (ref 11.1–15.9)
Immature Grans (Abs): 0 10*3/uL (ref 0.0–0.1)
Immature Granulocytes: 0 %
Lymphocytes Absolute: 1.4 10*3/uL (ref 0.7–3.1)
Lymphs: 48 %
MCH: 27.5 pg (ref 26.6–33.0)
MCHC: 32.1 g/dL (ref 31.5–35.7)
MCV: 86 fL (ref 79–97)
Monocytes Absolute: 0.2 10*3/uL (ref 0.1–0.9)
Monocytes: 7 %
Neutrophils Absolute: 1.2 10*3/uL — ABNORMAL LOW (ref 1.4–7.0)
Neutrophils: 40 %
Platelets: 197 10*3/uL (ref 150–450)
RBC: 4.58 x10E6/uL (ref 3.77–5.28)
RDW: 13.1 % (ref 11.7–15.4)
WBC: 2.9 10*3/uL — ABNORMAL LOW (ref 3.4–10.8)

## 2019-03-02 LAB — COMPREHENSIVE METABOLIC PANEL
ALT: 12 IU/L (ref 0–32)
AST: 19 IU/L (ref 0–40)
Albumin/Globulin Ratio: 1.4 (ref 1.2–2.2)
Albumin: 4.3 g/dL (ref 3.7–4.7)
Alkaline Phosphatase: 95 IU/L (ref 39–117)
BUN/Creatinine Ratio: 13 (ref 12–28)
BUN: 10 mg/dL (ref 8–27)
Bilirubin Total: 0.3 mg/dL (ref 0.0–1.2)
CO2: 21 mmol/L (ref 20–29)
Calcium: 9.9 mg/dL (ref 8.7–10.3)
Chloride: 106 mmol/L (ref 96–106)
Creatinine, Ser: 0.77 mg/dL (ref 0.57–1.00)
GFR calc Af Amer: 89 mL/min/{1.73_m2} (ref >59)
GFR calc non Af Amer: 77 mL/min/{1.73_m2} (ref >59)
Globulin, Total: 3.1 g/dL (ref 1.5–4.5)
Glucose: 90 mg/dL (ref 65–99)
Potassium: 4.1 mmol/L (ref 3.5–5.2)
Sodium: 144 mmol/L (ref 134–144)
Total Protein: 7.4 g/dL (ref 6.0–8.5)

## 2019-03-02 LAB — LIPID PANEL
Chol/HDL Ratio: 3.4 ratio (ref 0.0–4.4)
Cholesterol, Total: 215 mg/dL — ABNORMAL HIGH (ref 100–199)
HDL: 63 mg/dL (ref >39)
LDL Chol Calc (NIH): 140 mg/dL — ABNORMAL HIGH (ref 0–99)
Triglycerides: 69 mg/dL (ref 0–149)
VLDL Cholesterol Cal: 12 mg/dL (ref 5–40)

## 2019-03-02 LAB — HEMOGLOBIN A1C
Est. average glucose Bld gHb Est-mCnc: 111 mg/dL
Hgb A1c MFr Bld: 5.5 % (ref 4.8–5.6)

## 2019-03-03 LAB — URINE CULTURE

## 2019-03-06 ENCOUNTER — Encounter: Payer: Self-pay | Admitting: Emergency Medicine

## 2019-06-16 ENCOUNTER — Ambulatory Visit: Payer: Federal, State, Local not specified - PPO | Attending: Internal Medicine

## 2019-06-16 DIAGNOSIS — Z23 Encounter for immunization: Secondary | ICD-10-CM

## 2019-06-16 NOTE — Progress Notes (Signed)
   Covid-19 Vaccination Clinic  Name:  Danielle Kirby    MRN: [id number] DOB: 08-05-1945  06/16/2019  Danielle Kirby was observed post Covid-19 immunization for 15 minutes without incident. She was provided with Vaccine Information Sheet and instruction to access the V-Safe system.   Danielle Kirby was instructed to call 911 with any severe reactions post vaccine: Marland Kitchen Difficulty breathing  . Swelling of face and throat  . A fast heartbeat  . A bad rash all over body  . Dizziness and weakness   Immunizations Administered    Name Date Dose VIS Date Route   Pfizer COVID-19 Vaccine 06/16/2019 12:13 PM 0.3 mL 03/18/2019 Intramuscular   Manufacturer: [employer], [employer]   Lot: [id number]   NDC: [address]-1

## 2019-07-11 ENCOUNTER — Ambulatory Visit: Payer: Federal, State, Local not specified - PPO | Attending: Internal Medicine

## 2019-07-11 DIAGNOSIS — Z23 Encounter for immunization: Secondary | ICD-10-CM

## 2019-07-11 NOTE — Progress Notes (Signed)
   Covid-19 Vaccination Clinic  Name:  Danielle Kirby    MRN: [id number] DOB: 16-Nov-1945  07/11/2019  [name] was observed post Covid-19 immunization for 15 minutes without incident. She was provided with Vaccine Information Sheet and instruction to access the V-Safe system.   Ms. Offer was instructed to call 911 with any severe reactions post vaccine: Marland Kitchen Difficulty breathing  . Swelling of face and throat  . A fast heartbeat  . A bad rash all over body  . Dizziness and weakness   Immunizations Administered    Name Date Dose VIS Date Route   Pfizer COVID-19 Vaccine 07/11/2019  9:16 AM 0.3 mL 03/18/2019 Intramuscular   Manufacturer: [employer], [employer]   Lot: [id number]   NDC: [address]-2

## 2019-07-25 ENCOUNTER — Ambulatory Visit (INDEPENDENT_AMBULATORY_CARE_PROVIDER_SITE_OTHER): Payer: Federal, State, Local not specified - PPO

## 2019-07-25 ENCOUNTER — Other Ambulatory Visit: Payer: Self-pay

## 2019-07-25 ENCOUNTER — Encounter: Payer: Self-pay | Admitting: Emergency Medicine

## 2019-07-25 ENCOUNTER — Ambulatory Visit (INDEPENDENT_AMBULATORY_CARE_PROVIDER_SITE_OTHER): Payer: Federal, State, Local not specified - PPO | Admitting: Emergency Medicine

## 2019-07-25 VITALS — BP 128/74 | HR 82 | Temp 97.4°F | Resp 16 | Ht 67.0 in | Wt 188.0 lb

## 2019-07-25 DIAGNOSIS — M79642 Pain in left hand: Secondary | ICD-10-CM | POA: Diagnosis not present

## 2019-07-25 DIAGNOSIS — M7989 Other specified soft tissue disorders: Secondary | ICD-10-CM

## 2019-07-25 MED ORDER — MELOXICAM 15 MG PO TABS: 15.0000 mg | ORAL_TABLET | Freq: Every day | ORAL | 0 refills | Status: AC

## 2019-07-25 NOTE — Patient Instructions (Addendum)
     If you have lab work done today you will be contacted with your lab results within the next 2 weeks.  If you have not heard from [place] then please contact us. The fastest way to get your results is to register for My Chart.   IF you received an x-ray today, you will receive an invoice from [facility]. Please contact [facility] at [phone] with questions or concerns regarding your invoice.   IF you received labwork today, you will receive an invoice from [place]. Please contact [facility] at [phone] with questions or concerns regarding your invoice.   Our billing staff will not be able to assist you with questions regarding bills from these companies.  You will be contacted with the lab results as soon as they are available. The fastest way to get your results is to activate your My Chart account. Instructions are located on the last page of this paperwork. If you have not heard from [place] regarding the results in 2 weeks, please contact this office.     Hand Pain Many things can cause hand pain. Some common causes are:  An injury.  Repeating the same movement with your hand over and over (overuse).  Osteoporosis.  Arthritis.  Lumps in the tendons or joints of the hand and wrist (ganglion cysts).  Nerve compression syndromes (carpal tunnel syndrome).  Inflammation of the tendons (tendinitis).  Infection. Follow these instructions at home: Pay attention to any changes in your symptoms. Take these actions to help with your discomfort: Managing pain, stiffness, and swelling   Take over-the-counter and prescription medicines only as told by your health care provider.  Wear a hand splint or support as told by your health care provider.  If directed, put ice on the affected area: ? Put ice in a plastic bag. ? Place a towel between your skin and the bag. ? Leave the ice on for 20 minutes, 2-3 times a day. Activity  Take breaks from repetitive  activity often.  Avoid activities that make your pain worse.  Minimize stress on your hands and wrists as much as possible.  Do stretches or exercises as told by your health care provider.  Do not do activities that make your pain worse. Contact a health care provider if:  Your pain does not get better after a few days of self-care.  Your pain gets worse.  Your pain affects your ability to do your daily activities. Get help right away if:  Your hand becomes warm, red, or swollen.  Your hand is numb or tingling.  Your hand is extremely swollen or deformed.  Your hand or fingers turn white or blue.  You cannot move your hand, wrist, or fingers. Summary  Many things can cause hand pain.  Contact your health care provider if your pain does not get better after a few days of self care.  Minimize stress on your hands and wrists as much as possible.  Do not do activities that make your pain worse. This information is not intended to replace advice given to you by your health care provider. Make sure you discuss any questions you have with your health care provider. Document Revised: 12/18/2017 Document Reviewed: 12/18/2017 Elsevier Patient Education  2020 [employer].

## 2019-07-25 NOTE — Progress Notes (Signed)
Danielle Kirby 74 y.o.   Chief Complaint  Patient presents with  . Edema    LEFT hand with arm pain x 1 week    HISTORY OF PRESENT ILLNESS: This is a 74 y.o. female complaining of left hand swelling and pain for 1 week.  Better today than several days ago. Larey Seat about 2 weeks ago and not sure if she injured her left wrist or not.  No other complaints or medical concerns today. Thought it was secondary to recent second Covid injection.  HPI   Prior to Admission medications   Medication Sig Start Date End Date Taking? Authorizing Provider  OVER THE COUNTER MEDICATION daily.   Yes [provider]    Allergies  Allergen Reactions  . Penicillins     REACTION: hives    Patient Active Problem List   Diagnosis Date Noted  . 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 Allergy   . Arthritis   . Cataract   . Heart murmur     Past Surgical History:  Procedure Laterality Date  . BREAST SURGERY    . COSMETIC SURGERY    . EYE SURGERY    . TUBAL LIGATION      Social History   Socioeconomic History  . Marital status: Media planner    Spouse name: Not on file  . Number of children: Not on file  . Years of education: Not on file  . Highest education level: Not on file  Occupational History  . Not on file  Tobacco Use  . Smoking status: Never Smoker  . Smokeless tobacco: Never Used  Substance and Sexual Activity  . Alcohol use: No    Alcohol/week: 0.0 standard drinks  . Drug use: No  . Sexual activity: Yes    Birth control/protection: None  Other Topics Concern  . Not on file  Social History Narrative  . Not on file   Social Determinants of Health   Financial Resource Strain:   . Difficulty of Paying Living Expenses:   Food Insecurity:   . Worried About Programme researcher, broadcasting/film/video in the Last Year:   . Barista in  the Last Year:   Transportation Needs:   . Freight forwarder (Medical):   Marland Kitchen Lack of Transportation (Non-Medical):   Physical Activity:   . Days of Exercise per Week:   . Minutes of Exercise per Session:   Stress:   . Feeling of Stress :   Social Connections:   . Frequency of Communication with Friends and Family:   . Frequency of Social Gatherings with Friends and Family:   . Attends Religious Services:   . Active Member of Clubs or Organizations:   . Attends Banker Meetings:   Marland Kitchen Marital Status:   Intimate Partner Violence:   . Fear of Current or Ex-Partner:   . Emotionally Abused:   Marland Kitchen Physically Abused:   . Sexually Abused:     Family History  Problem Relation Age of Onset  . Diabetes Mother   . Hypertension Father      Review of Systems  Constitutional: Negative.  Negative for chills and fever.  HENT: Negative.  Negative for congestion and sore throat.   Respiratory: Negative.  Negative for cough and shortness of breath.   Cardiovascular: Negative.  Negative for chest pain and palpitations.  Gastrointestinal: Negative.  Negative for abdominal pain, nausea and vomiting.  Genitourinary: Negative.  Negative for dysuria and hematuria.  Musculoskeletal: Positive for joint pain.  Skin: Negative.  Negative for rash.  Neurological: Negative for dizziness and headaches.  All other systems reviewed and are negative.   Vitals:   07/25/19 1113  BP: 128/74  Pulse: 82  Resp: 16  Temp: (!) 97.4 F (36.3 C)  SpO2: 100%    Physical Exam Vitals reviewed.  Constitutional:      Appearance: Normal appearance.  HENT:     Head: Normocephalic.  Eyes:     Extraocular Movements: Extraocular movements intact.     Pupils: Pupils are equal, round, and reactive to light.  Cardiovascular:     Rate and Rhythm: Normal rate and regular rhythm.     Heart sounds: Normal heart sounds.  Pulmonary:     Effort: Pulmonary effort is normal.  Musculoskeletal:      Cervical back: Normal range of motion and neck supple.     Comments: Left wrist/hand: Positive swelling with some tenderness to palpation.  Full range of motion but complaining of pain.  Neurovascularly intact.  Skin:    General: Skin is warm and dry.     Capillary Refill: Capillary refill takes less than 2 seconds.  Neurological:     General: No focal deficit present.     Mental Status: She is alert and oriented to person, place, and time.  Psychiatric:        Mood and Affect: Mood normal.        Behavior: Behavior normal.     Today's Vitals   07/25/19 1113  BP: 128/74  Pulse: 82  Resp: 16  Temp: (!) 97.4 F (36.3 C)  TempSrc: Temporal  SpO2: 100%  Weight: 188 lb (85.3 kg)  Height: 5\' 7"  (1.702 m)   Body mass index is 29.44 kg/m. DG Wrist Complete Left  Result Date: 07/25/2019 CLINICAL DATA:  Left wrist and hand pain.  No known injury. EXAM: LEFT WRIST - COMPLETE 3+ VIEW COMPARISON:  None. FINDINGS: There is no acute bony or joint abnormality. Mild first CMC osteoarthritis is noted. Imaged joints otherwise appear normal. 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 Soft tissues about the wrist appear mildly swollen. IMPRESSION: Negative for acute bony abnormality. 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 Soft tissues about the wrist appear mildly swollen. Electronically Signed   By: 07/27/2019 M.D.   On: 07/25/2019 11:42    ASSESSMENT & PLAN: Danielle Kirby was seen today for edema.  Diagnoses and all orders for this visit:  Left hand pain -     DG Wrist Complete Left -     meloxicam (MOBIC) 15 MG tablet; Take 1 tablet (15 mg total) by mouth daily for 7 days.  Swelling of left hand -     DG Wrist Complete Left -     meloxicam (MOBIC) 15 MG tablet; Take 1 tablet (15 mg total) by mouth daily for 7 days.    Patient Instructions       If you have lab work done today you will be contacted with your lab results within the next 2 weeks.  If  you have not heard from [name] then please contact us. The fastest way to get your results is to register for My Chart.   IF you received an x-ray today, you will receive an invoice from [facility]. Please contact [facility] at [phone] with questions or concerns regarding your invoice.  IF you received labwork today, you will receive an invoice from [facility]. Please contact [facility] at [phone] with questions or concerns regarding your invoice.   Our billing staff will not be able to assist you with questions regarding bills from these companies.  You will be contacted with the lab results as soon as they are available. The fastest way to get your results is to activate your My Chart account. Instructions are located on the last page of this paperwork. If you have not heard from [place] regarding the results in 2 weeks, please contact this office.     Hand Pain Many things can cause hand pain. Some common causes are:  An injury.  Repeating the same movement with your hand over and over (overuse).  Osteoporosis.  Arthritis.  Lumps in the tendons or joints of the hand and wrist (ganglion cysts).  Nerve compression syndromes (carpal tunnel syndrome).  Inflammation of the tendons (tendinitis).  Infection. Follow these instructions at home: Pay attention to any changes in your symptoms. Take these actions to help with your discomfort: Managing pain, stiffness, and swelling   Take over-the-counter and prescription medicines only as told by your health care provider.  Wear a hand splint or support as told by your health care provider.  If directed, put ice on the affected area: ? Put ice in a plastic bag. ? Place a towel between your skin and the bag. ? Leave the ice on for 20 minutes, 2-3 times a day. Activity  Take breaks from repetitive activity often.  Avoid activities that make your pain worse.  Minimize stress on your hands and wrists as much as  possible.  Do stretches or exercises as told by your health care provider.  Do not do activities that make your pain worse. Contact a health care provider if:  Your pain does not get better after a few days of self-care.  Your pain gets worse.  Your pain affects your ability to do your daily activities. Get help right away if:  Your hand becomes warm, red, or swollen.  Your hand is numb or tingling.  Your hand is extremely swollen or deformed.  Your hand or fingers turn white or blue.  You cannot move your hand, wrist, or fingers. Summary  Many things can cause hand pain.  Contact your health care provider if your pain does not get better after a few days of self care.  Minimize stress on your hands and wrists as much as possible.  Do not do activities that make your pain worse. This information is not intended to replace advice given to you by your health care provider. Make sure you discuss any questions you have with your health care provider. Document Revised: 12/18/2017 Document Reviewed: 12/18/2017 Elsevier Patient Education  2020 Elsevier Inc.        Agustina Caroli, MD [facility]

## 2020-03-25 ENCOUNTER — Emergency Department (HOSPITAL_COMMUNITY)
Admission: EM | Admit: 2020-03-25 | Discharge: 2020-03-25 | Disposition: A | Discharge status: Home / Self Care | Payer: Federal, State, Local not specified - PPO | Attending: Emergency Medicine | Admitting: Emergency Medicine

## 2020-03-25 ENCOUNTER — Other Ambulatory Visit: Payer: Self-pay

## 2020-03-25 DIAGNOSIS — M62830 Muscle spasm of back: Secondary | ICD-10-CM | POA: Diagnosis not present

## 2020-03-25 DIAGNOSIS — R109 Unspecified abdominal pain: Secondary | ICD-10-CM | POA: Diagnosis present

## 2020-03-25 MED ORDER — IBUPROFEN 200 MG PO TABS
400.0000 mg | ORAL_TABLET | Freq: Once | ORAL | Status: AC
  Administered 2020-03-25: 400 mg via ORAL
  Filled 2020-03-25: qty 2

## 2020-03-25 MED ORDER — METHOCARBAMOL 500 MG PO TABS: 500.0000 mg | ORAL_TABLET | Freq: Two times a day (BID) | ORAL | 0 refills | Status: DC

## 2020-03-25 MED ORDER — DIAZEPAM 5 MG PO TABS
5.0000 mg | ORAL_TABLET | Freq: Once | ORAL | Status: AC
  Administered 2020-03-25: 5 mg via ORAL
  Filled 2020-03-25: qty 1

## 2020-03-25 NOTE — ED Provider Notes (Signed)
[facility]-EMERGENCY DEPT Provider Note   CSN: [id number] Arrival date & time: 03/25/20  1025     History Chief Complaint  Patient presents with  . Back Pain    Danielle Kirby is a 74 y.o. female.  74 year old female presents with several days of atraumatic right sided flank pain. Denies any urinary symptoms. States that she feels as if it is a muscle spasm was last for several seconds and then resolve spontaneously. Has never had this before has not medicate herself for it. Denies any history of rashes to the area. Nothing makes it better or worse. No radicular symptoms.        Past Medical History:  Diagnosis Date  . Allergy   . Arthritis   . Cataract   . Heart murmur     Patient Active Problem List   Diagnosis Date Noted  . 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 Degenerative arthritis of hip 05/23/2011    Past Surgical History:  Procedure Laterality Date  . BREAST SURGERY    . COSMETIC SURGERY    . EYE SURGERY    . TUBAL LIGATION       OB History   No obstetric history on file.     Family History  Problem Relation Age of Onset  . Diabetes Mother   . Hypertension Father     Social History   Tobacco Use  . Smoking status: Never Smoker  . Smokeless tobacco: Never Used  Substance Use Topics  . Alcohol use: No    Alcohol/week: 0.0 standard drinks  . Drug use: No    Home Medications Prior to Admission medications   Medication Sig Start Date End Date Taking? Authorizing Provider  OVER THE COUNTER MEDICATION daily.    [provider]    Allergies    Penicillins  Review of Systems   Review of Systems  All other systems reviewed and are negative.   Physical Exam Updated Vital Signs BP (!) 133/108   Pulse 88   Temp 98.7 F (37.1 C) (Oral)   Resp 18   SpO2 100%   Physical Exam Vitals and nursing note reviewed.  Constitutional:      General: She is  not in acute distress.    Appearance: Normal appearance. She is well-developed and well-nourished. She is not toxic-appearing.  HENT:     Head: Normocephalic and atraumatic.  Eyes:     General: Lids are normal.     Extraocular Movements: EOM normal.     Conjunctiva/sclera: Conjunctivae normal.     Pupils: Pupils are equal, round, and reactive to light.  Neck:     Thyroid: No thyroid mass.     Trachea: No tracheal deviation.  Cardiovascular:     Rate and Rhythm: Normal rate and regular rhythm.     Heart sounds: Normal heart sounds. No murmur heard. No gallop.   Pulmonary:     Effort: Pulmonary effort is normal. No respiratory distress.     Breath sounds: Normal breath sounds. No stridor. No decreased breath sounds, wheezing, rhonchi or rales.  Abdominal:     General: Bowel sounds are normal. There is no distension.     Palpations: Abdomen is soft.     Tenderness: There is no abdominal tenderness. There is no CVA tenderness or rebound.  Musculoskeletal:        General: No edema. Normal range of motion.     Cervical back:  Normal range of motion and neck supple.     Lumbar back: Spasms and tenderness present. No bony tenderness.       Back:  Skin:    General: Skin is warm and dry.     Findings: No abrasion or rash.  Neurological:     Mental Status: She is alert and oriented to person, place, and time.     GCS: GCS eye subscore is 4. GCS verbal subscore is 5. GCS motor subscore is 6.     Cranial Nerves: No cranial nerve deficit.     Sensory: No sensory deficit.     Deep Tendon Reflexes: Strength normal.  Psychiatric:        Mood and Affect: Mood and affect normal.        Speech: Speech normal.        Behavior: Behavior normal.     ED Results / Procedures / Treatments   Labs (all labs ordered are listed, but only abnormal results are displayed) Labs Reviewed - No data to display  EKG None  Radiology No results found.  Procedures Procedures (including critical care  time)  Medications Ordered in ED Medications  diazepam (VALIUM) tablet 5 mg (has no administration in time range)  ibuprofen (ADVIL) tablet 400 mg (has no administration in time range)    ED Course  I have reviewed the triage vital signs and the nursing notes.  Pertinent labs & imaging results that were available during my care of the patient were reviewed by me and considered in my medical decision making (see chart for details).    MDM Rules/Calculators/A&P                          Patient given Valium here and feels better.  Suspect muscle cramps.  Will prescribe Robaxin and discharge Final Clinical Impression(s) / ED Diagnoses Final diagnoses:  None    Rx / DC Orders ED Discharge Orders    None       Lorre Nick, MD 03/25/20 1549

## 2020-03-25 NOTE — ED Triage Notes (Signed)
Back spasms/back pain x 3 days; denies radiation. Pain 7/10. Denies any urinary problems.

## 2020-06-04 ENCOUNTER — Inpatient Hospital Stay: Payer: Self-pay

## 2020-06-04 ENCOUNTER — Encounter: Payer: Self-pay | Admitting: Emergency Medicine

## 2020-06-04 ENCOUNTER — Ambulatory Visit (INDEPENDENT_AMBULATORY_CARE_PROVIDER_SITE_OTHER): Payer: Federal, State, Local not specified - PPO | Admitting: Emergency Medicine

## 2020-06-04 ENCOUNTER — Other Ambulatory Visit: Payer: Self-pay

## 2020-06-04 VITALS — BP 167/83 | HR 67 | Temp 98.2°F | Resp 16 | Ht 67.0 in | Wt 192.0 lb

## 2020-06-04 DIAGNOSIS — M5136 Other intervertebral disc degeneration, lumbar region: Secondary | ICD-10-CM

## 2020-06-04 DIAGNOSIS — M79604 Pain in right leg: Secondary | ICD-10-CM | POA: Diagnosis not present

## 2020-06-04 DIAGNOSIS — M5431 Sciatica, right side: Secondary | ICD-10-CM | POA: Diagnosis not present

## 2020-06-04 DIAGNOSIS — M1611 Unilateral primary osteoarthritis, right hip: Secondary | ICD-10-CM | POA: Diagnosis not present

## 2020-06-04 NOTE — Patient Instructions (Addendum)
If you have lab work done today you will be contacted with your lab results within the next 2 weeks.  If you have not heard from [place] then please contact us. The fastest way to get your results is to register for My Chart.   IF you received an x-ray today, you will receive an invoice from [facility]. Please contact [facility] at [phone] with questions or concerns regarding your invoice.   IF you received labwork today, you will receive an invoice from [facility]. Please contact [facility] at [phone] with questions or concerns regarding your invoice.   Our billing staff will not be able to assist you with questions regarding bills from these companies.  You will be contacted with the lab results as soon as they are available. The fastest way to get your results is to activate your My Chart account. Instructions are located on the last page of this paperwork. If you have not heard from [place] regarding the results in 2 weeks, please contact this office.     Osteoarthritis  Osteoarthritis is a type of arthritis. It refers to joint pain or joint disease. Osteoarthritis affects tissue that covers the ends of bones in joints (cartilage). Cartilage acts as a cushion between the bones and helps them move smoothly. Osteoarthritis occurs when cartilage in the joints gets worn down. Osteoarthritis is sometimes called "wear and tear" arthritis. Osteoarthritis is the most common form of arthritis. It often occurs in older people. It is a condition that gets worse over time. The joints most often affected by this condition are in the fingers, toes, hips, knees, and spine, including the neck and lower back. What are the causes? This condition is caused by the wearing down of cartilage that covers the ends of bones. What increases the risk? The following factors may make you more likely to develop this condition:  Being age 47 or older.  Obesity.  Overuse of joints.  Past  injury of a joint.  Past surgery on a joint.  Family history of osteoarthritis. What are the signs or symptoms? The main symptoms of this condition are pain, swelling, and stiffness in the joint. Other symptoms may include:  An enlarged joint.  More pain and further damage caused by small pieces of bone or cartilage that break off and float inside of the joint.  Small deposits of bone (osteophytes) that grow on the edges of the joint.  A grating or scraping feeling inside the joint when you move it.  Popping or creaking sounds when you move.  Difficulty walking or exercising.  An inability to grip items, twist your hand(s), or control the movements of your hands and fingers. How is this diagnosed? This condition may be diagnosed based on:  Your medical history.  A physical exam.  Your symptoms.  X-rays of the affected joint(s).  Blood tests to rule out other types of arthritis. How is this treated? There is no cure for this condition, but treatment can help control pain and improve joint function. Treatment may include a combination of therapies, such as:  Pain relief techniques, such as: ? Applying heat and cold to the joint. ? Massage. ? A form of talk therapy called cognitive behavioral therapy (CBT). This therapy helps you set goals and follow up on the changes that you make.  Medicines for pain and inflammation. The medicines can be taken by mouth or applied to the skin. They include: ? NSAIDs, such as ibuprofen. ? Prescription medicines. ?  Strong anti-inflammatory medicines (corticosteroids). ? Certain nutritional supplements.  A prescribed exercise program. You may work with a physical therapist.  Assistive devices, such as a brace, wrap, splint, specialized glove, or cane.  A weight control plan.  Surgery, such as: ? An osteotomy. This is done to reposition the bones and relieve pain or to remove loose pieces of bone and cartilage. ? Joint replacement  surgery. You may need this surgery if you have advanced osteoarthritis. Follow these instructions at home: Activity  Rest your affected joints as told by your health care provider.  Exercise as told by your health care provider. He or she may recommend specific types of exercise, such as: ? Strengthening exercises. These are done to strengthen the muscles that support joints affected by arthritis. ? Aerobic activities. These are exercises, such as brisk walking or water aerobics, that increase your heart rate. ? Range-of-motion activities. These help your joints move more easily. ? Balance and agility exercises. Managing pain, stiffness, and swelling  If directed, apply heat to the affected area as often as told by your health care provider. Use the heat source that your health care provider recommends, such as a moist heat pack or a heating pad. ? If you have a removable assistive device, remove it as told by your health care provider. ? Place a towel between your skin and the heat source. If your health care provider tells you to keep the assistive device on while you apply heat, place a towel between the assistive device and the heat source. ? Leave the heat on for 20-30 minutes. ? Remove the heat if your skin turns bright red. This is especially important if you are unable to feel pain, heat, or cold. You may have a greater risk of getting burned.  If directed, put ice on the affected area. To do this: ? If you have a removable assistive device, remove it as told by your health care provider. ? Put ice in a plastic bag. ? Place a towel between your skin and the bag. If your health care provider tells you to keep the assistive device on during icing, place a towel between the assistive device and the bag. ? Leave the ice on for 20 minutes, 2-3 times a day. ? Move your fingers or toes often to reduce stiffness and swelling. ? Raise (elevate) the injured area above the level of your heart  while you are sitting or lying down.      General instructions  Take over-the-counter and prescription medicines only as told by your health care provider.  Maintain a healthy weight. Follow instructions from your health care provider for weight control.  Do not use any products that contain nicotine or tobacco, such as cigarettes, e-cigarettes, and chewing tobacco. If you need help quitting, ask your health care provider.  Use assistive devices as told by your health care provider.  Keep all follow-up visits as told by your health care provider. This is important. Where to find more information  [employer] of Arthritis and Musculoskeletal and Skin Diseases: www.niams.South[employer].es  [employer] on Aging: http://kim-miller.com/  American College of Rheumatology: www.rheumatology.org Contact a health care provider if:  You have redness, swelling, or a feeling of warmth in a joint that gets worse.  You have a fever along with joint or muscle aches.  You develop a rash.  You have trouble doing your normal activities. Get help right away if:  You have pain that gets worse and is not  relieved by pain medicine. Summary  Osteoarthritis is a type of arthritis that affects tissue covering the ends of bones in joints (cartilage).  This condition is caused by the wearing down of cartilage that covers the ends of bones.  The main symptom of this condition is pain, swelling, and stiffness in the joint.  There is no cure for this condition, but treatment can help control pain and improve joint function. This information is not intended to replace advice given to you by your health care provider. Make sure you discuss any questions you have with your health care provider. Document Revised: 03/21/2019 Document Reviewed: 03/21/2019 Elsevier Patient Education  2021 [employer]  Sciatica  Sciatica is pain, weakness, tingling, or loss of feeling (numbness) along the sciatic nerve. The  sciatic nerve starts in the lower back and goes down the back of each leg. Sciatica usually goes away on its own or with treatment. Sometimes, sciatica may come back (recur). What are the causes? This condition happens when the sciatic nerve is pinched or has pressure put on it. This may be the result of:  A disk in between the bones of the spine bulging out too far (herniated disk).  Changes in the spinal disks that occur with aging.  A condition that affects a muscle in the butt.  Extra bone growth near the sciatic nerve.  A break (fracture) of the area between your hip bones (pelvis).  Pregnancy.  Tumor. This is rare. What increases the risk? You are more likely to develop this condition if you:  Play sports that put pressure or stress on the spine.  Have poor strength and ease of movement (flexibility).  Have had a back injury in the past.  Have had back surgery.  Sit for long periods of time.  Do activities that involve bending or lifting over and over again.  Are very overweight (obese). What are the signs or symptoms? Symptoms can vary from mild to very bad. They may include:  Any of these problems in the lower back, leg, hip, or butt: ? Mild tingling, loss of feeling, or dull aches. ? Burning sensations. ? Sharp pains.  Loss of feeling in the back of the calf or the sole of the foot.  Leg weakness.  Very bad back pain that makes it hard to move. These symptoms may get worse when you cough, sneeze, or laugh. They may also get worse when you sit or stand for long periods of time. How is this treated? This condition often gets better without any treatment. However, treatment may include:  Changing or cutting back on physical activity when you have pain.  Doing exercises and stretching.  Putting ice or heat on the affected area.  Medicines that help: ? To relieve pain and swelling. ? To relax your muscles.  Shots (injections) of medicines that help to  relieve pain, irritation, and swelling.  Surgery. Follow these instructions at home: Medicines  Take over-the-counter and prescription medicines only as told by your doctor.  Ask your doctor if the medicine prescribed to you: ? Requires you to avoid driving or using heavy machinery. ? Can cause trouble pooping (constipation). You may need to take these steps to prevent or treat trouble pooping:  Drink enough fluids to keep your pee (urine) pale yellow.  Take over-the-counter or prescription medicines.  Eat foods that are high in fiber. These include beans, whole grains, and fresh fruits and vegetables.  Limit foods that are high in fat and  sugar. These include fried or sweet foods. Managing pain  If told, put ice on the affected area. ? Put ice in a plastic bag. ? Place a towel between your skin and the bag. ? Leave the ice on for 20 minutes, 2-3 times a day.  If told, put heat on the affected area. Use the heat source that your doctor tells you to use, such as a moist heat pack or a heating pad. ? Place a towel between your skin and the heat source. ? Leave the heat on for 20-30 minutes. ? Remove the heat if your skin turns bright red. This is very important if you are unable to feel pain, heat, or cold. You may have a greater risk of getting burned.      Activity  Return to your normal activities as told by your doctor. Ask your doctor what activities are safe for you.  Avoid activities that make your symptoms worse.  Take short rests during the day. ? When you rest for a long time, do some physical activity or stretching between periods of rest. ? Avoid sitting for a long time without moving. Get up and move around at least one time each hour.  Exercise and stretch regularly, as told by your doctor.  Do not lift anything that is heavier than 10 lb (4.5 kg) while you have symptoms of sciatica. ? Avoid lifting heavy things even when you do not have symptoms. ? Avoid  lifting heavy things over and over.  When you lift objects, always lift in a way that is safe for your body. To do this, you should: ? Bend your knees. ? Keep the object close to your body. ? Avoid twisting.   General instructions  Stay at a healthy weight.  Wear comfortable shoes that support your feet. Avoid wearing high heels.  Avoid sleeping on a mattress that is too soft or too hard. You might have less pain if you sleep on a mattress that is firm enough to support your back.  Keep all follow-up visits as told by your doctor. This is important. Contact a doctor if:  You have pain that: ? Wakes you up when you are sleeping. ? Gets worse when you lie down. ? Is worse than the pain you have had in the past. ? Lasts longer than 4 weeks.  You lose weight without trying. Get help right away if:  You cannot control when you pee (urinate) or poop (have a bowel movement).  You have weakness in any of these areas and it gets worse: ? Lower back. ? The area between your hip bones. ? Butt. ? Legs.  You have redness or swelling of your back.  You have a burning feeling when you pee. Summary  Sciatica is pain, weakness, tingling, or loss of feeling (numbness) along the sciatic nerve.  This condition happens when the sciatic nerve is pinched or has pressure put on it.  Sciatica can cause pain, tingling, or loss of feeling (numbness) in the lower back, legs, hips, and butt.  Treatment often includes rest, exercise, medicines, and putting ice or heat on the affected area. This information is not intended to replace advice given to you by your health care provider. Make sure you discuss any questions you have with your health care provider. Document Revised: 04/12/2018 Document Reviewed: 04/12/2018 Elsevier Patient Education  2021 [employer].

## 2020-06-04 NOTE — Progress Notes (Signed)
Danielle Kirby 75 y.o.   Chief Complaint  Patient presents with  . Leg Pain    Per patient pain has been going for years, but started back one month ago.    HISTORY OF PRESENT ILLNESS: This is a 75 y.o. female complaining of intermittent pain to right upper leg for the past couple weeks. Had to go to the emergency department several weeks ago.  Treated and released. Pain seems to start at the lumbar spine level with radiation to right upper leg.  Sharp pain lasting 1 to 2 days. Denies trauma.  No associated symptoms.  Denies bladder or bowel symptoms. Worried about blood clots to leg. No other complaint or medical concerns today.  HPI   Prior to Admission medications   Medication Sig Start Date End Date Taking? Authorizing Provider  OVER THE COUNTER MEDICATION daily.   Yes [provider]  methocarbamol (ROBAXIN) 500 MG tablet Take 1 tablet (500 mg total) by mouth 2 (two) times daily. Patient not taking: Reported on 06/04/2020 03/25/20   Lorre Nick, MD    Allergies  Allergen Reactions  . Penicillins     REACTION: hives    Patient Active Problem List   Diagnosis Date Noted  . 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 Allergy   . Arthritis   . Cataract   . Heart murmur     Past Surgical History:  Procedure Laterality Date  . BREAST SURGERY    . COSMETIC SURGERY    . EYE SURGERY    . TUBAL LIGATION      Social History   Socioeconomic History  . Marital status: Media planner    Spouse name: Not on file  . Number of children: Not on file  . Years of education: Not on file  . Highest education level: Not on file  Occupational History  . Not on file  Tobacco Use  . Smoking status: Never Smoker  . Smokeless tobacco: Never Used  Substance and Sexual Activity  . Alcohol use: No    Alcohol/week: 0.0 standard  drinks  . Drug use: No  . Sexual activity: Yes    Birth control/protection: None  Other Topics Concern  . Not on file  Social History Narrative  . Not on file   Social Determinants of Health   Financial Resource Strain: Not on file  Food Insecurity: Not on file  Transportation Needs: Not on file  Physical Activity: Not on file  Stress: Not on file  Social Connections: Not on file  Intimate Partner Violence: Not on file    Family History  Problem Relation Age of Onset  . Diabetes Mother   . Hypertension Father      Review of Systems  Constitutional: Negative.  Negative for chills and fever.  HENT: Negative.  Negative for congestion and sore throat.   Respiratory: Negative.  Negative for shortness of breath.   Cardiovascular: Negative.  Negative for chest pain and palpitations.  Gastrointestinal: Negative.  Negative for abdominal pain, diarrhea, nausea and vomiting.  Genitourinary: Negative.  Negative for dysuria and hematuria.  Musculoskeletal: Positive for back pain.  Skin: Negative.  Negative for rash.  Neurological: Negative.  Negative for dizziness, sensory change, focal weakness and headaches.  All other systems reviewed and are negative.  Today's Vitals   06/04/20 1503  BP: (!) 167/83  Pulse: 67  Resp: 16  Temp: 98.2 F (36.8 C)  TempSrc: Temporal  SpO2: 99%  Weight: 192 lb (87.1 kg)  Height: 5\' 7"  (1.702 m)   Body mass index is 30.07 kg/m.   Physical Exam Vitals reviewed.  Constitutional:      Appearance: Normal appearance.  HENT:     Head: Normocephalic.  Eyes:     Extraocular Movements: Extraocular movements intact.     Pupils: Pupils are equal, round, and reactive to light.  Cardiovascular:     Rate and Rhythm: Normal rate.  Pulmonary:     Effort: Pulmonary effort is normal.  Abdominal:     Palpations: Abdomen is soft.     Tenderness: There is no abdominal tenderness.  Musculoskeletal:     Cervical back: Normal and normal range of  motion.     Thoracic back: Normal.     Lumbar back: No bony tenderness. Decreased range of motion. Positive right straight leg raise test.  Skin:    General: Skin is warm and dry.     Capillary Refill: Capillary refill takes less than 2 seconds.  Neurological:     General: No focal deficit present.     Mental Status: She is alert and oriented to person, place, and time.     Sensory: No sensory deficit.     Motor: No weakness.     Deep Tendon Reflexes: Reflexes abnormal (Diminished knee reflexes).  Psychiatric:        Mood and Affect: Mood normal.        Behavior: Behavior normal.      ASSESSMENT & PLAN: Danielle Kirby was seen today for leg pain.  Diagnoses and all orders for this visit:  Right leg pain -     [facility] LOWER EXTREMITY VENOUS DVT RIGHT (BACK OFFICE); Future  Sciatica, right side -     [facility] Lumbar Spine Wo Contrast; Future -     Ambulatory referral to Orthopedic Surgery  Primary osteoarthritis of right hip -     Ambulatory referral to Orthopedic Surgery  Other intervertebral disc degeneration, lumbar region -     MR Lumbar Spine Wo Contrast; Future    Patient Instructions       If you have lab work done today you will be contacted with your lab results within the next 2 weeks.  If you have not heard from [name] then please contact us. The fastest way to get your results is to register for My Chart.   IF you received an x-ray today, you will receive an invoice from [facility]. Please contact [facility] at [phone] with questions or concerns regarding your invoice.   IF you received labwork today, you will receive an invoice from [facility]. Please contact [facility] at [phone] with questions or concerns regarding your invoice.   Our billing staff will not be able to assist you with questions regarding bills from these companies.  You will be contacted with the lab results as soon as they are available. The fastest way to get your results is to  activate your My Chart account. Instructions are located on the last page of this paperwork. If you have not heard from [phone] regarding the results in 2 weeks, please contact this office.     Osteoarthritis  Osteoarthritis is a type of arthritis. It refers to joint pain or joint disease. Osteoarthritis affects tissue that covers the ends of bones in joints (cartilage). Cartilage acts as a cushion between the bones and helps them  move smoothly. Osteoarthritis occurs when cartilage in the joints gets worn down. Osteoarthritis is sometimes called "wear and tear" arthritis. Osteoarthritis is the most common form of arthritis. It often occurs in older people. It is a condition that gets worse over time. The joints most often affected by this condition are in the fingers, toes, hips, knees, and spine, including the neck and lower back. What are the causes? This condition is caused by the wearing down of cartilage that covers the ends of bones. What increases the risk? The following factors may make you more likely to develop this condition:  Being age 75 or older.  Obesity.  Overuse of joints.  Past injury of a joint.  Past surgery on a joint.  Family history of osteoarthritis. What are the signs or symptoms? The main symptoms of this condition are pain, swelling, and stiffness in the joint. Other symptoms may include:  An enlarged joint.  More pain and further damage caused by small pieces of bone or cartilage that break off and float inside of the joint.  Small deposits of bone (osteophytes) that grow on the edges of the joint.  A grating or scraping feeling inside the joint when you move it.  Popping or creaking sounds when you move.  Difficulty walking or exercising.  An inability to grip items, twist your hand(s), or control the movements of your hands and fingers. How is this diagnosed? This condition may be diagnosed based on:  Your medical history.  A physical  exam.  Your symptoms.  X-rays of the affected joint(s).  Blood tests to rule out other types of arthritis. How is this treated? There is no cure for this condition, but treatment can help control pain and improve joint function. Treatment may include a combination of therapies, such as:  Pain relief techniques, such as: ? Applying heat and cold to the joint. ? Massage. ? A form of talk therapy called cognitive behavioral therapy (CBT). This therapy helps you set goals and follow up on the changes that you make.  Medicines for pain and inflammation. The medicines can be taken by mouth or applied to the skin. They include: ? NSAIDs, such as ibuprofen. ? Prescription medicines. ? Strong anti-inflammatory medicines (corticosteroids). ? Certain nutritional supplements.  A prescribed exercise program. You may work with a physical therapist.  Assistive devices, such as a brace, wrap, splint, specialized glove, or cane.  A weight control plan.  Surgery, such as: ? An osteotomy. This is done to reposition the bones and relieve pain or to remove loose pieces of bone and cartilage. ? Joint replacement surgery. You may need this surgery if you have advanced osteoarthritis. Follow these instructions at home: Activity  Rest your affected joints as told by your health care provider.  Exercise as told by your health care provider. He or she may recommend specific types of exercise, such as: ? Strengthening exercises. These are done to strengthen the muscles that support joints affected by arthritis. ? Aerobic activities. These are exercises, such as brisk walking or water aerobics, that increase your heart rate. ? Range-of-motion activities. These help your joints move more easily. ? Balance and agility exercises. Managing pain, stiffness, and swelling  If directed, apply heat to the affected area as often as told by your health care provider. Use the heat source that your health care  provider recommends, such as a moist heat pack or a heating pad. ? If you have a removable assistive device, remove it as  told by your health care provider. ? Place a towel between your skin and the heat source. If your health care provider tells you to keep the assistive device on while you apply heat, place a towel between the assistive device and the heat source. ? Leave the heat on for 20-30 minutes. ? Remove the heat if your skin turns bright red. This is especially important if you are unable to feel pain, heat, or cold. You may have a greater risk of getting burned.  If directed, put ice on the affected area. To do this: ? If you have a removable assistive device, remove it as told by your health care provider. ? Put ice in a plastic bag. ? Place a towel between your skin and the bag. If your health care provider tells you to keep the assistive device on during icing, place a towel between the assistive device and the bag. ? Leave the ice on for 20 minutes, 2-3 times a day. ? Move your fingers or toes often to reduce stiffness and swelling. ? Raise (elevate) the injured area above the level of your heart while you are sitting or lying down.      General instructions  Take over-the-counter and prescription medicines only as told by your health care provider.  Maintain a healthy weight. Follow instructions from your health care provider for weight control.  Do not use any products that contain nicotine or tobacco, such as cigarettes, e-cigarettes, and chewing tobacco. If you need help quitting, ask your health care provider.  Use assistive devices as told by your health care provider.  Keep all follow-up visits as told by your health care provider. This is important. Where to find more information  [employer] of Arthritis and Musculoskeletal and Skin Diseases: www.niams.http://www.myers.net/  [employer] on Aging: https://walker.com/  American College of Rheumatology:  www.rheumatology.org Contact a health care provider if:  You have redness, swelling, or a feeling of warmth in a joint that gets worse.  You have a fever along with joint or muscle aches.  You develop a rash.  You have trouble doing your normal activities. Get help right away if:  You have pain that gets worse and is not relieved by pain medicine. Summary  Osteoarthritis is a type of arthritis that affects tissue covering the ends of bones in joints (cartilage).  This condition is caused by the wearing down of cartilage that covers the ends of bones.  The main symptom of this condition is pain, swelling, and stiffness in the joint.  There is no cure for this condition, but treatment can help control pain and improve joint function. This information is not intended to replace advice given to you by your health care provider. Make sure you discuss any questions you have with your health care provider. Document Revised: 03/21/2019 Document Reviewed: 03/21/2019 Elsevier Patient Education  2021 Elsevier Inc.  Sciatica  Sciatica is pain, weakness, tingling, or loss of feeling (numbness) along the sciatic nerve. The sciatic nerve starts in the lower back and goes down the back of each leg. Sciatica usually goes away on its own or with treatment. Sometimes, sciatica may come back (recur). What are the causes? This condition happens when the sciatic nerve is pinched or has pressure put on it. This may be the result of:  A disk in between the bones of the spine bulging out too far (herniated disk).  Changes in the spinal disks that occur with aging.  A condition that affects  a muscle in the butt.  Extra bone growth near the sciatic nerve.  A break (fracture) of the area between your hip bones (pelvis).  Pregnancy.  Tumor. This is rare. What increases the risk? You are more likely to develop this condition if you:  Play sports that put pressure or stress on the spine.  Have  poor strength and ease of movement (flexibility).  Have had a back injury in the past.  Have had back surgery.  Sit for long periods of time.  Do activities that involve bending or lifting over and over again.  Are very overweight (obese). What are the signs or symptoms? Symptoms can vary from mild to very bad. They may include:  Any of these problems in the lower back, leg, hip, or butt: ? Mild tingling, loss of feeling, or dull aches. ? Burning sensations. ? Sharp pains.  Loss of feeling in the back of the calf or the sole of the foot.  Leg weakness.  Very bad back pain that makes it hard to move. These symptoms may get worse when you cough, sneeze, or laugh. They may also get worse when you sit or stand for long periods of time. How is this treated? This condition often gets better without any treatment. However, treatment may include:  Changing or cutting back on physical activity when you have pain.  Doing exercises and stretching.  Putting ice or heat on the affected area.  Medicines that help: ? To relieve pain and swelling. ? To relax your muscles.  Shots (injections) of medicines that help to relieve pain, irritation, and swelling.  Surgery. Follow these instructions at home: Medicines  Take over-the-counter and prescription medicines only as told by your doctor.  Ask your doctor if the medicine prescribed to you: ? Requires you to avoid driving or using heavy machinery. ? Can cause trouble pooping (constipation). You may need to take these steps to prevent or treat trouble pooping:  Drink enough fluids to keep your pee (urine) pale yellow.  Take over-the-counter or prescription medicines.  Eat foods that are high in fiber. These include beans, whole grains, and fresh fruits and vegetables.  Limit foods that are high in fat and sugar. These include fried or sweet foods. Managing pain  If told, put ice on the affected area. ? Put ice in a plastic  bag. ? Place a towel between your skin and the bag. ? Leave the ice on for 20 minutes, 2-3 times a day.  If told, put heat on the affected area. Use the heat source that your doctor tells you to use, such as a moist heat pack or a heating pad. ? Place a towel between your skin and the heat source. ? Leave the heat on for 20-30 minutes. ? Remove the heat if your skin turns bright red. This is very important if you are unable to feel pain, heat, or cold. You may have a greater risk of getting burned.      Activity  Return to your normal activities as told by your doctor. Ask your doctor what activities are safe for you.  Avoid activities that make your symptoms worse.  Take short rests during the day. ? When you rest for a long time, do some physical activity or stretching between periods of rest. ? Avoid sitting for a long time without moving. Get up and move around at least one time each hour.  Exercise and stretch regularly, as told by your doctor.  Do  not lift anything that is heavier than 10 lb (4.5 kg) while you have symptoms of sciatica. ? Avoid lifting heavy things even when you do not have symptoms. ? Avoid lifting heavy things over and over.  When you lift objects, always lift in a way that is safe for your body. To do this, you should: ? Bend your knees. ? Keep the object close to your body. ? Avoid twisting.   General instructions  Stay at a healthy weight.  Wear comfortable shoes that support your feet. Avoid wearing high heels.  Avoid sleeping on a mattress that is too soft or too hard. You might have less pain if you sleep on a mattress that is firm enough to support your back.  Keep all follow-up visits as told by your doctor. This is important. Contact a doctor if:  You have pain that: ? Wakes you up when you are sleeping. ? Gets worse when you lie down. ? Is worse than the pain you have had in the past. ? Lasts longer than 4 weeks.  You lose weight  without trying. Get help right away if:  You cannot control when you pee (urinate) or poop (have a bowel movement).  You have weakness in any of these areas and it gets worse: ? Lower back. ? The area between your hip bones. ? Butt. ? Legs.  You have redness or swelling of your back.  You have a burning feeling when you pee. Summary  Sciatica is pain, weakness, tingling, or loss of feeling (numbness) along the sciatic nerve.  This condition happens when the sciatic nerve is pinched or has pressure put on it.  Sciatica can cause pain, tingling, or loss of feeling (numbness) in the lower back, legs, hips, and butt.  Treatment often includes rest, exercise, medicines, and putting ice or heat on the affected area. This information is not intended to replace advice given to you by your health care provider. Make sure you discuss any questions you have with your health care provider. Document Revised: 04/12/2018 Document Reviewed: 04/12/2018 Elsevier Patient Education  2021 Elsevier Inc.      Edwina Barth, MD [facility]

## 2020-06-20 ENCOUNTER — Ambulatory Visit: Payer: Federal, State, Local not specified - PPO | Admitting: Physician Assistant

## 2020-07-26 ENCOUNTER — Emergency Department (HOSPITAL_COMMUNITY): Payer: Medicare Other

## 2020-07-26 ENCOUNTER — Inpatient Hospital Stay (HOSPITAL_COMMUNITY): Payer: Medicare Other

## 2020-07-26 ENCOUNTER — Inpatient Hospital Stay (HOSPITAL_COMMUNITY)
Admission: EM | Admit: 2020-07-26 | Discharge: 2020-08-02 | DRG: 083 | Disposition: A | Discharge status: Home / Self Care | Payer: Medicare Other | Attending: Neurological Surgery | Admitting: Neurological Surgery

## 2020-07-26 DIAGNOSIS — M1990 Unspecified osteoarthritis, unspecified site: Secondary | ICD-10-CM | POA: Diagnosis present

## 2020-07-26 DIAGNOSIS — S069X0A Unspecified intracranial injury without loss of consciousness, initial encounter: Secondary | ICD-10-CM | POA: Diagnosis not present

## 2020-07-26 DIAGNOSIS — S065X9A Traumatic subdural hemorrhage with loss of consciousness of unspecified duration, initial encounter: Secondary | ICD-10-CM | POA: Diagnosis not present

## 2020-07-26 DIAGNOSIS — Z8249 Family history of ischemic heart disease and other diseases of the circulatory system: Secondary | ICD-10-CM | POA: Diagnosis not present

## 2020-07-26 DIAGNOSIS — S065X9S Traumatic subdural hemorrhage with loss of consciousness of unspecified duration, sequela: Secondary | ICD-10-CM | POA: Diagnosis present

## 2020-07-26 DIAGNOSIS — S52501S Unspecified fracture of the lower end of right radius, sequela: Secondary | ICD-10-CM | POA: Diagnosis not present

## 2020-07-26 DIAGNOSIS — Z20822 Contact with and (suspected) exposure to covid-19: Secondary | ICD-10-CM | POA: Diagnosis not present

## 2020-07-26 DIAGNOSIS — S52571A Other intraarticular fracture of lower end of right radius, initial encounter for closed fracture: Secondary | ICD-10-CM | POA: Diagnosis present

## 2020-07-26 DIAGNOSIS — Z880 Allergy status to penicillin: Secondary | ICD-10-CM

## 2020-07-26 DIAGNOSIS — W109XXA Fall (on) (from) unspecified stairs and steps, initial encounter: Secondary | ICD-10-CM | POA: Diagnosis present

## 2020-07-26 DIAGNOSIS — G8918 Other acute postprocedural pain: Secondary | ICD-10-CM | POA: Diagnosis not present

## 2020-07-26 DIAGNOSIS — S069X0D Unspecified intracranial injury without loss of consciousness, subsequent encounter: Secondary | ICD-10-CM | POA: Diagnosis not present

## 2020-07-26 DIAGNOSIS — S0240CA Maxillary fracture, right side, initial encounter for closed fracture: Secondary | ICD-10-CM | POA: Diagnosis present

## 2020-07-26 DIAGNOSIS — S069XAA Unspecified intracranial injury with loss of consciousness status unknown, initial encounter: Secondary | ICD-10-CM | POA: Diagnosis present

## 2020-07-26 DIAGNOSIS — I10 Essential (primary) hypertension: Secondary | ICD-10-CM | POA: Diagnosis not present

## 2020-07-26 DIAGNOSIS — S069X1D Unspecified intracranial injury with loss of consciousness of 30 minutes or less, subsequent encounter: Secondary | ICD-10-CM | POA: Diagnosis not present

## 2020-07-26 DIAGNOSIS — R296 Repeated falls: Secondary | ICD-10-CM | POA: Diagnosis not present

## 2020-07-26 DIAGNOSIS — S52571D Other intraarticular fracture of lower end of right radius, subsequent encounter for closed fracture with routine healing: Secondary | ICD-10-CM | POA: Diagnosis not present

## 2020-07-26 DIAGNOSIS — I671 Cerebral aneurysm, nonruptured: Secondary | ICD-10-CM | POA: Diagnosis present

## 2020-07-26 DIAGNOSIS — S02841A Fracture of lateral orbital wall, right side, initial encounter for closed fracture: Secondary | ICD-10-CM | POA: Diagnosis present

## 2020-07-26 DIAGNOSIS — M7122 Synovial cyst of popliteal space [Baker], left knee: Secondary | ICD-10-CM | POA: Diagnosis not present

## 2020-07-26 DIAGNOSIS — T1490XA Injury, unspecified, initial encounter: Secondary | ICD-10-CM

## 2020-07-26 DIAGNOSIS — S069X1S Unspecified intracranial injury with loss of consciousness of 30 minutes or less, sequela: Secondary | ICD-10-CM | POA: Diagnosis not present

## 2020-07-26 DIAGNOSIS — I82402 Acute embolism and thrombosis of unspecified deep veins of left lower extremity: Secondary | ICD-10-CM | POA: Diagnosis not present

## 2020-07-26 DIAGNOSIS — W19XXXS Unspecified fall, sequela: Secondary | ICD-10-CM | POA: Diagnosis present

## 2020-07-26 DIAGNOSIS — R451 Restlessness and agitation: Secondary | ICD-10-CM | POA: Diagnosis present

## 2020-07-26 DIAGNOSIS — Z9851 Tubal ligation status: Secondary | ICD-10-CM | POA: Diagnosis not present

## 2020-07-26 DIAGNOSIS — I82452 Acute embolism and thrombosis of left peroneal vein: Secondary | ICD-10-CM | POA: Diagnosis not present

## 2020-07-26 DIAGNOSIS — S0990XA Unspecified injury of head, initial encounter: Secondary | ICD-10-CM | POA: Diagnosis not present

## 2020-07-26 DIAGNOSIS — W19XXXA Unspecified fall, initial encounter: Secondary | ICD-10-CM

## 2020-07-26 DIAGNOSIS — I6522 Occlusion and stenosis of left carotid artery: Secondary | ICD-10-CM | POA: Diagnosis present

## 2020-07-26 DIAGNOSIS — K5900 Constipation, unspecified: Secondary | ICD-10-CM | POA: Diagnosis present

## 2020-07-26 DIAGNOSIS — D62 Acute posthemorrhagic anemia: Secondary | ICD-10-CM | POA: Diagnosis not present

## 2020-07-26 DIAGNOSIS — G479 Sleep disorder, unspecified: Secondary | ICD-10-CM | POA: Diagnosis not present

## 2020-07-26 DIAGNOSIS — M7989 Other specified soft tissue disorders: Secondary | ICD-10-CM | POA: Diagnosis not present

## 2020-07-26 DIAGNOSIS — E876 Hypokalemia: Secondary | ICD-10-CM | POA: Diagnosis not present

## 2020-07-26 DIAGNOSIS — Z833 Family history of diabetes mellitus: Secondary | ICD-10-CM | POA: Diagnosis not present

## 2020-07-26 DIAGNOSIS — Z9181 History of falling: Secondary | ICD-10-CM | POA: Diagnosis not present

## 2020-07-26 DIAGNOSIS — S0240CD Maxillary fracture, right side, subsequent encounter for fracture with routine healing: Secondary | ICD-10-CM | POA: Diagnosis not present

## 2020-07-26 DIAGNOSIS — R4701 Aphasia: Secondary | ICD-10-CM | POA: Diagnosis not present

## 2020-07-26 DIAGNOSIS — S065XAA Traumatic subdural hemorrhage with loss of consciousness status unknown, initial encounter: Secondary | ICD-10-CM

## 2020-07-26 DIAGNOSIS — W19XXXD Unspecified fall, subsequent encounter: Secondary | ICD-10-CM | POA: Diagnosis present

## 2020-07-26 DIAGNOSIS — S069X9A Unspecified intracranial injury with loss of consciousness of unspecified duration, initial encounter: Secondary | ICD-10-CM | POA: Diagnosis present

## 2020-07-26 DIAGNOSIS — S52121D Displaced fracture of head of right radius, subsequent encounter for closed fracture with routine healing: Secondary | ICD-10-CM | POA: Diagnosis not present

## 2020-07-26 DIAGNOSIS — S0231XD Fracture of orbital floor, right side, subsequent encounter for fracture with routine healing: Secondary | ICD-10-CM | POA: Diagnosis not present

## 2020-07-26 DIAGNOSIS — S02401A Maxillary fracture, unspecified, initial encounter for closed fracture: Secondary | ICD-10-CM

## 2020-07-26 DIAGNOSIS — S02841D Fracture of lateral orbital wall, right side, subsequent encounter for fracture with routine healing: Secondary | ICD-10-CM | POA: Diagnosis not present

## 2020-07-26 DIAGNOSIS — R4182 Altered mental status, unspecified: Secondary | ICD-10-CM | POA: Diagnosis not present

## 2020-07-26 DIAGNOSIS — R609 Edema, unspecified: Secondary | ICD-10-CM | POA: Diagnosis not present

## 2020-07-26 DIAGNOSIS — M25562 Pain in left knee: Secondary | ICD-10-CM

## 2020-07-26 DIAGNOSIS — S52124D Nondisplaced fracture of head of right radius, subsequent encounter for closed fracture with routine healing: Secondary | ICD-10-CM | POA: Diagnosis not present

## 2020-07-26 LAB — PROTIME-INR
INR: 1.1 (ref 0.8–1.2)
Prothrombin Time: 14.2 seconds (ref 11.4–15.2)

## 2020-07-26 LAB — COMPREHENSIVE METABOLIC PANEL
ALT: 18 U/L (ref 0–44)
AST: 26 U/L (ref 15–41)
Albumin: 3.6 g/dL (ref 3.5–5.0)
Alkaline Phosphatase: 70 U/L (ref 38–126)
Anion gap: 16 — ABNORMAL HIGH (ref 5–15)
BUN: 17 mg/dL (ref 8–23)
CO2: 20 mmol/L — ABNORMAL LOW (ref 22–32)
Calcium: 10.1 mg/dL (ref 8.9–10.3)
Chloride: 103 mmol/L (ref 98–111)
Creatinine, Ser: 1.08 mg/dL — ABNORMAL HIGH (ref 0.44–1.00)
GFR, Estimated: 54 mL/min — ABNORMAL LOW (ref >60)
Glucose, Bld: 127 mg/dL — ABNORMAL HIGH (ref 70–99)
Potassium: 3.4 mmol/L — ABNORMAL LOW (ref 3.5–5.1)
Sodium: 139 mmol/L (ref 135–145)
Total Bilirubin: 1.1 mg/dL (ref 0.3–1.2)
Total Protein: 7.5 g/dL (ref 6.5–8.1)

## 2020-07-26 LAB — DIFFERENTIAL
Abs Immature Granulocytes: 0.07 10*3/uL (ref 0.00–0.07)
Basophils Absolute: 0 10*3/uL (ref 0.0–0.1)
Basophils Relative: 0 %
Eosinophils Absolute: 0 10*3/uL (ref 0.0–0.5)
Eosinophils Relative: 0 %
Immature Granulocytes: 1 %
Lymphocytes Relative: 16 %
Lymphs Abs: 1.7 10*3/uL (ref 0.7–4.0)
Monocytes Absolute: 0.8 10*3/uL (ref 0.1–1.0)
Monocytes Relative: 7 %
Neutro Abs: 8.4 10*3/uL — ABNORMAL HIGH (ref 1.7–7.7)
Neutrophils Relative %: 76 %

## 2020-07-26 LAB — CBG MONITORING, ED: Glucose-Capillary: 133 mg/dL — ABNORMAL HIGH (ref 70–99)

## 2020-07-26 LAB — I-STAT CHEM 8, ED
BUN: 21 mg/dL (ref 8–23)
Calcium, Ion: 1.12 mmol/L — ABNORMAL LOW (ref 1.15–1.40)
Chloride: 107 mmol/L (ref 98–111)
Creatinine, Ser: 0.9 mg/dL (ref 0.44–1.00)
Glucose, Bld: 129 mg/dL — ABNORMAL HIGH (ref 70–99)
HCT: 37 % (ref 36.0–46.0)
Hemoglobin: 12.6 g/dL (ref 12.0–15.0)
Potassium: 3.4 mmol/L — ABNORMAL LOW (ref 3.5–5.1)
Sodium: 140 mmol/L (ref 135–145)
TCO2: 25 mmol/L (ref 22–32)

## 2020-07-26 LAB — CBC
HCT: 40.8 % (ref 36.0–46.0)
Hemoglobin: 12 g/dL (ref 12.0–15.0)
MCH: 26.8 pg (ref 26.0–34.0)
MCHC: 29.4 g/dL — ABNORMAL LOW (ref 30.0–36.0)
MCV: 91.3 fL (ref 80.0–100.0)
Platelets: 191 10*3/uL (ref 150–400)
RBC: 4.47 MIL/uL (ref 3.87–5.11)
RDW: 14.3 % (ref 11.5–15.5)
WBC: 10.9 10*3/uL — ABNORMAL HIGH (ref 4.0–10.5)
nRBC: 0 % (ref 0.0–0.2)

## 2020-07-26 LAB — APTT: aPTT: 21 seconds — ABNORMAL LOW (ref 24–36)

## 2020-07-26 LAB — RESP PANEL BY RT-PCR (FLU A&B, COVID) ARPGX2
Influenza A by PCR: NEGATIVE
Influenza B by PCR: NEGATIVE
SARS Coronavirus 2 by RT PCR: NEGATIVE

## 2020-07-26 LAB — ETHANOL: Alcohol, Ethyl (B): <10 mg/dL (ref <10)

## 2020-07-26 MED ORDER — CHLORHEXIDINE GLUCONATE 0.12 % MT SOLN
15.0000 mL | Freq: Two times a day (BID) | OROMUCOSAL | Status: DC
  Administered 2020-07-26 – 2020-08-02 (×13): 15 mL via OROMUCOSAL
  Filled 2020-07-26 (×11): qty 15

## 2020-07-26 MED ORDER — LEVETIRACETAM IN NACL 500 MG/100ML IV SOLN
500.0000 mg | Freq: Two times a day (BID) | INTRAVENOUS | Status: DC
  Administered 2020-07-26 – 2020-07-31 (×10): 500 mg via INTRAVENOUS
  Filled 2020-07-26 (×10): qty 100

## 2020-07-26 MED ORDER — ORAL CARE MOUTH RINSE
15.0000 mL | Freq: Two times a day (BID) | OROMUCOSAL | Status: DC
  Administered 2020-07-27 – 2020-07-31 (×7): 15 mL via OROMUCOSAL

## 2020-07-26 MED ORDER — OXYCODONE HCL 5 MG PO TABS: 5.0000 mg | ORAL_TABLET | ORAL | Status: DC | PRN

## 2020-07-26 MED ORDER — GADOBUTROL 1 MMOL/ML IV SOLN
7.5000 mL | Freq: Once | INTRAVENOUS | Status: AC | PRN
  Administered 2020-07-26: 7.5 mL via INTRAVENOUS

## 2020-07-26 MED ORDER — IOHEXOL 300 MG/ML  SOLN
70.0000 mL | Freq: Once | INTRAMUSCULAR | Status: AC | PRN
  Administered 2020-07-26: 70 mL via INTRAVENOUS

## 2020-07-26 MED ORDER — CHLORHEXIDINE GLUCONATE CLOTH 2 % EX PADS
6.0000 | MEDICATED_PAD | Freq: Every day | CUTANEOUS | Status: DC
  Administered 2020-07-28 – 2020-08-02 (×6): 6 via TOPICAL

## 2020-07-26 MED ORDER — METOPROLOL TARTRATE 5 MG/5ML IV SOLN
5.0000 mg | Freq: Four times a day (QID) | INTRAVENOUS | Status: DC | PRN
  Administered 2020-07-28: 5 mg via INTRAVENOUS
  Filled 2020-07-26: qty 5

## 2020-07-26 MED ORDER — ONDANSETRON HCL 4 MG/2ML IJ SOLN: 4.0000 mg | Freq: Four times a day (QID) | INTRAMUSCULAR | Status: DC | PRN

## 2020-07-26 MED ORDER — POLYETHYLENE GLYCOL 3350 17 G PO PACK: 17.0000 g | PACK | Freq: Every day | ORAL | Status: DC | PRN

## 2020-07-26 MED ORDER — MORPHINE SULFATE (PF) 2 MG/ML IV SOLN
2.0000 mg | INTRAVENOUS | Status: DC | PRN
Start: 2020-07-26 — End: 2020-07-28

## 2020-07-26 MED ORDER — ONDANSETRON 4 MG PO TBDP: 4.0000 mg | ORAL_TABLET | Freq: Four times a day (QID) | ORAL | Status: DC | PRN

## 2020-07-26 MED ORDER — ACETAMINOPHEN 325 MG PO TABS: 650.0000 mg | ORAL_TABLET | ORAL | Status: DC | PRN

## 2020-07-26 MED ORDER — DOCUSATE SODIUM 100 MG PO CAPS
100.0000 mg | ORAL_CAPSULE | Freq: Two times a day (BID) | ORAL | Status: DC
  Administered 2020-07-26 – 2020-08-02 (×14): 100 mg via ORAL
  Filled 2020-07-26 (×14): qty 1

## 2020-07-26 MED ORDER — PANTOPRAZOLE SODIUM 40 MG PO TBEC: 40.0000 mg | DELAYED_RELEASE_TABLET | Freq: Every day | ORAL | Status: DC

## 2020-07-26 MED ORDER — IOHEXOL 350 MG/ML SOLN
100.0000 mL | Freq: Once | INTRAVENOUS | Status: AC | PRN
  Administered 2020-07-26: 100 mL via INTRAVENOUS

## 2020-07-26 MED ORDER — SODIUM CHLORIDE 0.9 % IV SOLN: INTRAVENOUS | Status: DC

## 2020-07-26 MED ORDER — PANTOPRAZOLE SODIUM 40 MG IV SOLR
40.0000 mg | Freq: Every day | INTRAVENOUS | Status: DC
  Administered 2020-07-26: 40 mg via INTRAVENOUS
  Filled 2020-07-26: qty 40

## 2020-07-26 MED ORDER — METHOCARBAMOL 500 MG PO TABS: 500.0000 mg | ORAL_TABLET | Freq: Four times a day (QID) | ORAL | Status: DC | PRN

## 2020-07-26 NOTE — Code Documentation (Signed)
Stroke Response Nurse Documentation Code Documentation  Danielle Kirby is a 75 y.o. female arriving to [facility] ED via [facility] on 07/26/2020 with past medical hx of heart murmur. Code stroke was activated by EMS. Patient from home where she was LKW at 0700 and now complaining of memory loss, right arm weakness, and right > left leg weakness. Patient had a fall on Sunday and hit her right eye. Husband reports she has been more tired than usual and a little confused since then. Today, she woke up and went to the bathroom at 0700 when her husband heard a thud and found her unresponsive. EMS got there and patient had snoring respirations and unresponsive, but during transport patient started to become more awake. Upon arrival to the ED, pt reports not remembering what happened.   Stroke team at the bedside on patient arrival. Labs drawn and patient cleared for CT by Dr. Hyacinth Meeker. Patient to CT with team. NIHSS 9, see documentation for details and code stroke times. Patient with right arm weakness, bilateral leg weakness and Expressive aphasia  on exam. The following imaging was completed:  CT, CTA head and neck, CTP. Patient is not a candidate for tPA due to LKW most likely Sunday after speaking with husband.   Patient noted to have deformity to right wrist and bruising to right eye and right hip. Complained of pain in back when moving to the CT. MD Hyacinth Meeker made aware and ordered Xrays for evaluation.  Care/Plan: q2 mNIHSS/VS. Bedside handoff with ED RN Chestine Spore.    Lucila Maine  Stroke Response RN

## 2020-07-26 NOTE — Progress Notes (Signed)
Orthopedic Tech Progress Note Patient Details:  Danielle Kirby 05-27-1945 [id number]  Ortho Devices Type of Ortho Device: Sugartong splint,Sling immobilizer Ortho Device/Splint Location: rue Ortho Device/Splint Interventions: Ordered,Application,Adjustment   Post Interventions Patient Tolerated: Well Instructions Provided: Care of device,Poper ambulation with device,Adjustment of device   [name] 07/26/2020, 4:37 PM

## 2020-07-26 NOTE — ED Triage Notes (Addendum)
Patient BIB [facility] with patient called out after husband found patient unresponsive in bathroom. Patient ambulated to bathroom and then husband heard a "thud" at which point he found her on the bathroom floor. Patient still unresponsive on EMS arrival to home with snoring respirations, patient alert and oriented on arrival to ED. Obvious deforming to right forearm, patient moving left arm without issue. Patient having difficulty lifting bilateral legs. Right orbital bruised from a fall on Sunday.

## 2020-07-26 NOTE — Consult Note (Addendum)
Reason for Consult: facial fractures Referring Physician: Fleet Contras MD Location: [facility] ED-inpatient Date: 4.21.22   Danielle Kirby is a 75 y.o. female   HPI: Plastic Surgery consulted for evaluation facial fractures incurred following fall at home. This is second fall noted over last week. Other injuries include SDH and wrist fracture. Patient initially brought in under code stroke protocol. Patient reports glasses for near and far vision, denies prior eye surgery. Denies diplopia. She states she had prior cosmetic breast augmentation and that the implants were removed this summer. Reviewed with patient the implants on exam are still in place.   Past Medical History:  Diagnosis Date  . Allergy   . Arthritis   . Cataract   . Heart murmur      Past Surgical History:  Procedure Laterality Date  . BREAST SURGERY    . COSMETIC SURGERY    . EYE SURGERY    . TUBAL LIGATION        reports that she has never smoked. She has never used smokeless tobacco. She reports that she does not drink alcohol and does not use drugs.  Allergies  Allergen Reactions  . Penicillins     REACTION: hives     Medications: I have reviewed the patient's current medications  Results for orders placed or performed during the hospital encounter of 07/26/20 (from the past 24 hour(s))  Resp Panel by RT-PCR (Flu A&B, Covid) Nasopharyngeal Swab     Status: None   Collection Time: 07/26/20  8:52 AM   Specimen: Nasopharyngeal Swab; Nasopharyngeal(NP) swabs in vial transport medium  Result Value Ref Range   SARS Coronavirus 2 by RT PCR NEGATIVE NEGATIVE   Influenza A by PCR NEGATIVE NEGATIVE   Influenza B by PCR NEGATIVE NEGATIVE  Ethanol     Status: None   Collection Time: 07/26/20  8:52 AM  Result Value Ref Range   Alcohol, Ethyl (B) <10 <10 mg/dL  Protime-INR     Status: None   Collection Time: 07/26/20  8:52 AM  Result Value Ref Range   Prothrombin Time 14.2 11.4 - 15.2 seconds   INR 1.1 0.8 - 1.2   APTT     Status: Abnormal   Collection Time: 07/26/20  8:52 AM  Result Value Ref Range   aPTT 21 (L) 24 - 36 seconds  CBC     Status: Abnormal   Collection Time: 07/26/20  8:52 AM  Result Value Ref Range   WBC 10.9 (H) 4.0 - 10.5 K/uL   RBC 4.47 3.87 - 5.11 MIL/uL   Hemoglobin 12.0 12.0 - 15.0 g/dL   HCT 71.2 45.8 - 09.9 %   MCV 91.3 80.0 - 100.0 fL   MCH 26.8 26.0 - 34.0 pg   MCHC 29.4 (L) 30.0 - 36.0 g/dL   RDW 83.3 82.5 - 05.3 %   Platelets 191 150 - 400 K/uL   nRBC 0.0 0.0 - 0.2 %  Differential     Status: Abnormal   Collection Time: 07/26/20  8:52 AM  Result Value Ref Range   Neutrophils Relative % 76 %   Neutro Abs 8.4 (H) 1.7 - 7.7 K/uL   Lymphocytes Relative 16 %   Lymphs Abs 1.7 0.7 - 4.0 K/uL   Monocytes Relative 7 %   Monocytes Absolute 0.8 0.1 - 1.0 K/uL   Eosinophils Relative 0 %   Eosinophils Absolute 0.0 0.0 - 0.5 K/uL   Basophils Relative 0 %   Basophils Absolute 0.0 0.0 -  0.1 K/uL   Immature Granulocytes 1 %   Abs Immature Granulocytes 0.07 0.00 - 0.07 K/uL  Comprehensive metabolic panel     Status: Abnormal   Collection Time: 07/26/20  8:52 AM  Result Value Ref Range   Sodium 139 135 - 145 mmol/L   Potassium 3.4 (L) 3.5 - 5.1 mmol/L   Chloride 103 98 - 111 mmol/L   CO2 20 (L) 22 - 32 mmol/L   Glucose, Bld 127 (H) 70 - 99 mg/dL   BUN 17 8 - 23 mg/dL   Creatinine, Ser 8.45 (H) 0.44 - 1.00 mg/dL   Calcium 36.4 8.9 - 68.0 mg/dL   Total Protein 7.5 6.5 - 8.1 g/dL   Albumin 3.6 3.5 - 5.0 g/dL   AST 26 15 - 41 U/L   ALT 18 0 - 44 U/L   Alkaline Phosphatase 70 38 - 126 U/L   Total Bilirubin 1.1 0.3 - 1.2 mg/dL   GFR, Estimated 54 (L) >60 mL/min   Anion gap 16 (H) 5 - 15  CBG monitoring, ED     Status: Abnormal   Collection Time: 07/26/20  8:53 AM  Result Value Ref Range   Glucose-Capillary 133 (H) 70 - 99 mg/dL  I-stat chem 8, ED     Status: Abnormal   Collection Time: 07/26/20  8:59 AM  Result Value Ref Range   Sodium 140 135 - 145 mmol/L    Potassium 3.4 (L) 3.5 - 5.1 mmol/L   Chloride 107 98 - 111 mmol/L   BUN 21 8 - 23 mg/dL   Creatinine, Ser 3.21 0.44 - 1.00 mg/dL   Glucose, Bld 224 (H) 70 - 99 mg/dL   Calcium, Ion 8.25 (L) 1.15 - 1.40 mmol/L   TCO2 25 22 - 32 mmol/L   Hemoglobin 12.6 12.0 - 15.0 g/dL   HCT 00.3 70.4 - 88.8 %     CT HEAD CODE STROKE WO CONTRAST  Result Date: 07/26/2020 CLINICAL DATA:  Code stroke.  Neuro deficit, acute stroke suspected. EXAM: CT HEAD WITHOUT CONTRAST TECHNIQUE: Contiguous axial images were obtained from the base of the skull through the vertex without intravenous contrast. COMPARISON:  None. FINDINGS: Brain: Small right thalamocapsular hypodensity could represent a an age-indeterminate lacunar infarct. No evidence of acute large vascular territory infarct. Moderate patchy white matter hypoattenuation, most likely related to chronic microvascular ischemic disease. Small (3 mm) largely low density left cerebral convexity subdural collection. Suspected trace hyperdensity posteriorly within the collection, concerning for acute/recent hemorrhage. Approximately 2 mm of rightward midline shift. Generalized cerebral volume loss with ex vacuo ventricular dilation. No hydrocephalus. No mass lesion. Vascular: No hyperdense vessel identified. Skull: No acute fracture. Sinuses/Orbits: Right periorbital and pre maxillary soft tissue contusion. Fracture of all walls of the right maxillary sinus with layering hemosinus. Fractures of the right lateral orbital wall and orbital floor with herniation a small amount of fat through the orbital floor defect. The infraorbital foramen is involved. Bilateral staphylomas. Other: No mastoid effusions. ASPECTS [facility] Stroke Program Early CT Score) Total score (0-10 with 10 being normal): 9 IMPRESSION: 1. Small right thalamocapsular hypodensity could represent an age indeterminate lacunar infarct. MRI could further evaluate if clinically indicated. ASPECTS 9 at worst. 2. Small (3  mm) largely low density left cerebral convexity subdural collection, suspicious for subdural hematoma or hygroma that is likely in part chronic. Suspected trace hyperdensity posteriorly within the collection, concerning for acute/recent hemorrhage. Approximately 2 mm of rightward midline shift. 3. Right periorbital and pre  maxillary soft tissue contusion. Fracture of all walls of the right maxillary sinus with layering hemosinus. Additional fractures of the right lateral orbital wall and orbital floor with herniation a small amount of fat through the orbital floor defect and involvement of the infraorbital foramen. Dedicated maxillofacial CT could further characterize if clinically indicated. 4. 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 Findings discussed with Dr. Iver Nestle via telephone at 9:12 PM. Electronically Signed   By: Feliberto Harts MD   On: 07/26/2020 09:31   EXAM: CT MAXILLOFACIAL WITHOUT CONTRAST  TECHNIQUE: Multidetector CT imaging of the maxillofacial structures was performed. Multiplanar CT image reconstructions were also generated.  COMPARISON:  Head CT 07/26/2020  FINDINGS: Osseous: Acute comminuted fracture of the right orbital floor with herniation of a small amount of fat through the fracture defect (series 608, image 33). Inferior rectus muscle does not extend into the fracture defect. Fracture involves the infraorbital foramen.  Comminuted displaced fracture through the lateral wall of the right maxillary sinus with angulated fracture fragment (series 605, image 31).  Slight bony irregularity along the medial wall of the right maxillary sinus at the level of the maxillary antrum. Blood products obstructs the right maxillary infundibulum.  Minimally displaced fracture through the right lateral orbital wall (series 605, image 33; series 21, image 39). Fracture margin closely approximate the traversing lateral rectus muscle.  Orbits:  Posttraumatic stranding along the orbital floor adjacent to the fracture site. No intraorbital collection or hematoma. Globes are intact and symmetric. Left orbital structures are unremarkable.  Sinuses: Layering blood products within the right maxillary sinus secondary to previously described fractures. Remaining paranasal sinuses are clear. Mastoid air cells are clear.  Soft tissues: Right periorbital and pre maxillary soft tissue swelling. Small hematoma overlies the right zygoma measuring approximately 1.8 x 0.6 cm.  Limited intracranial: See dedicated CT and MRI of the brain performed same day.  IMPRESSION: 1. Acute comminuted fracture of the right orbital floor with herniation of a small amount of fat through the fracture defect. Inferior rectus muscle does not extend into the fracture defect. 2. Minimally displaced fracture through the right lateral orbital wall. Fracture margin closely approximates the traversing lateral rectus muscle. 3. Comminuted displaced fracture through the lateral wall of the right maxillary. Slight bony irregularity along the medial wall of the right maxillary sinus at the level of the maxillary antrum. 4. Right periorbital and pre maxillary soft tissue swelling with small hematoma overlying the right zygoma.   Electronically Signed   By: Duanne Guess D.O.   On: 07/26/2020 14:06  BP (!) 176/94   Pulse 75   Temp 99 F (37.2 C)   Resp 15   Ht 5\' 7"  (1.702 m)   Wt 85.2 kg   SpO2 100%   BMI 29.42 kg/m    Physical Exam: Gen: alert follows commands HEENT: partial dentures, no intraoral lesions, NTTP over mandible No septal hematoma TMs clear EOMI, right periorbital ecchymoses  Assessment/Plan: CT head and CT maxillofacial reviewed personally. Right lateral orbit fracture without displacement. Right maxillary sinus fracture. Right floor fracture with some herniation periorbital fat present. Patient poor historian but clinically no  entrapment and no plans for surgical intervention at this time. In continues to have repeated falls this may risk increase injuries.   F/u as OP as needed. Recommend full ophthalmologic eye exam as OP.  , MD MBA Plastic & Reconstructive Surgery   Glenna Fellows, MD [facility] Plastic & Reconstructive Surgery

## 2020-07-26 NOTE — ED Notes (Signed)
Pt in MRI.

## 2020-07-26 NOTE — H&P (Signed)
[facility] Admission Note  [facility] [birth year]  [id number].    Requesting MD: Eber Hong Chief Complaint/Reason for Consult: fall x2  HPI:  Danielle Kirby is a 75yo female PMH heart murmur who was brought into [facility] via ambulance earlier today after suffering multiple falls.  The patient has no recollection of her falls and isn't aware she even had falls.  She is unsure why she is in the hospital so most of her history is obtained from the chart as her significant other is not present currently either. Per report the patient fell on Sunday. She had been using crutches that day due to left hip pain. Significant other heard her fall down 4-6 stairs and found her at the bottom of the stairs. She complained of hip and facial pain but refused to go to the ED. Since that fall her significant other states that she has been more tired and lethargic. This morning she got up to use the bathroom and suffered a ground level fall, possibly secondary to a syncopal event. Initially she was able to ambulate but fell again therefore her significant other called EMS. Paramedics noted bruising and deformity to the right wrist, diminished use of the RUE, bruising around the right eye, and some difficulty with speech. Code stroke was called pre-hospital.  Work up revealed Right wrist/Elbow fx, SDH vs hygroma with 31mm midline shift, and multiple facial fractures with some scans still pending. Trauma asked to see for admission.  Anticoagulants: none Nonsmoker Denies alcohol or illicit drug use Lives at home with her significant other.  ROS  All systems reviewed and otherwise negative except for as above as best as the patient can tell me as she has a very poor memory.  Family History  Problem Relation Age of Onset  . Diabetes Mother   . Hypertension Father     Past Medical History:  Diagnosis Date  . Allergy   . Arthritis   . Cataract   . Heart murmur     Past Surgical History:   Procedure Laterality Date  . BREAST SURGERY    . COSMETIC SURGERY    . EYE SURGERY    . TUBAL LIGATION      Social History:  reports that she has never smoked. She has never used smokeless tobacco. She reports that she does not drink alcohol and does not use drugs.  Allergies:  Allergies  Allergen Reactions  . Penicillins     REACTION: hives    (Not in a hospital admission)   Prior to Admission medications   Medication Sig Start Date End Date Taking? Authorizing Provider  OVER THE COUNTER MEDICATION daily.   Yes [provider]  methocarbamol (ROBAXIN) 500 MG tablet Take 1 tablet (500 mg total) by mouth 2 (two) times daily. Patient not taking: No sig reported 03/25/20   Lorre Nick, MD    Blood pressure (!) 176/94, pulse 75, temperature 99 F (37.2 C), resp. rate 15, height 5\' 7"  (1.702 m), weight 85.2 kg, SpO2 100 %. Physical Exam: General: pleasant, WD/WN black female who is laying in bed in NAD HEENT: head is normocephalic, but with ecchymosis around right eye, above and below.  Left sclera noninjected, but right with some hemorrhage present.  PERRL.  Ears and nose without any masses or lesions.  Mouth is pink and moist.  Neck is supple with midline trachea.  No midline neck tenderness.   Heart: regular, rate, and rhythm.  Normal s1,s2. No obvious  murmurs, gallops, or rubs noted.  Palpable pedal pulses bilaterally  Lungs: CTAB, no wheezes, rhonchi, or rales noted.  Respiratory effort nonlabored.  No chest wall tenderness Abd: soft, NT/ND, +BS, no masses, hernias, or organomegaly.  No traumatic injuries noted. MS: all 4 extremities are symmetrical, except RUE with obvious wrist deformity and edema.  Limited ROM of the wrist and elbow on right side.  No cyanosis, clubbing, or edema of any other extremities.  Moves BLE and denies any pain with movement of her hips, knees, or ankles while in bed.  Back is nontender along midline with no step-offs or other injuries  noted Skin: warm and dry with no masses, lesions, or rashes Psych: A&Ox4 but she has no recollection of events from today and can't tell me any details with accuracy from recent history. Neuro: cranial nerves grossly intact, equal strength in BLE, normal speech, sensation normal throughout.  Results for orders placed or performed during the hospital encounter of 07/26/20 (from the past 48 hour(s))  Resp Panel by RT-PCR (Flu A&B, Covid) Nasopharyngeal Swab     Status: None   Collection Time: 07/26/20  8:52 AM   Specimen: Nasopharyngeal Swab; Nasopharyngeal(NP) swabs in vial transport medium  Result Value Ref Range   SARS Coronavirus 2 by RT PCR NEGATIVE NEGATIVE    Comment: (NOTE) SARS-CoV-2 target nucleic acids are NOT DETECTED.  The SARS-CoV-2 RNA is generally detectable in upper respiratory specimens during the acute phase of infection. The lowest concentration of SARS-CoV-2 viral copies this assay can detect is 138 copies/mL. A negative result does not preclude SARS-Cov-2 infection and should not be used as the sole basis for treatment or other patient management decisions. A negative result may occur with  improper specimen collection/handling, submission of specimen other than nasopharyngeal swab, presence of viral mutation(s) within the areas targeted by this assay, and inadequate number of viral copies(<138 copies/mL). A negative result must be combined with clinical observations, patient history, and epidemiological information. The expected result is Negative.  Fact Sheet for Patients:  BloggerCourse.com  Fact Sheet for Healthcare Providers:  SeriousBroker.it  This test is no t yet approved or cleared by the [place] FDA and  has been authorized for detection and/or diagnosis of SARS-CoV-2 by FDA under an Emergency Use Authorization (EUA). This EUA will remain  in effect (meaning this test can be used) for the duration  of the COVID-19 declaration under Section 564(b)(1) of the Act, 21 U.S.C.section 360bbb-3(b)(1), unless the authorization is terminated  or revoked sooner.       Influenza A by PCR NEGATIVE NEGATIVE   Influenza B by PCR NEGATIVE NEGATIVE    Comment: (NOTE) The Xpert Xpress SARS-CoV-2/FLU/RSV plus assay is intended as an aid in the diagnosis of influenza from Nasopharyngeal swab specimens and should not be used as a sole basis for treatment. Nasal washings and aspirates are unacceptable for Xpert Xpress SARS-CoV-2/FLU/RSV testing.  Fact Sheet for Patients: BloggerCourse.com  Fact Sheet for Healthcare Providers: SeriousBroker.it  This test is not yet approved or cleared by the [place] FDA and has been authorized for detection and/or diagnosis of SARS-CoV-2 by FDA under an Emergency Use Authorization (EUA). This EUA will remain in effect (meaning this test can be used) for the duration of the COVID-19 declaration under Section 564(b)(1) of the Act, 21 U.S.C. section 360bbb-3(b)(1), unless the authorization is terminated or revoked.  Performed at [facility], [address], [place], [place] [address]   Ethanol  Status: None   Collection Time: 07/26/20  8:52 AM  Result Value Ref Range   Alcohol, Ethyl (B) <10 <10 mg/dL    Comment: (NOTE) Lowest detectable limit for serum alcohol is 10 mg/dL.  For medical purposes only. Performed at [facility], [address], [place], [place] [address]   Protime-INR     Status: None   Collection Time: 07/26/20  8:52 AM  Result Value Ref Range   Prothrombin Time 14.2 11.4 - 15.2 seconds   INR 1.1 0.8 - 1.2    Comment: (NOTE) INR goal varies based on device and disease states. Performed at [facility], [address] [place], [place] [address]   APTT     Status: Abnormal   Collection Time: 07/26/20  8:52 AM  Result Value Ref Range   aPTT 21 (L) 24 - 36  seconds    Comment: Performed at [facility], [address], [place], [place] [address]  CBC     Status: Abnormal   Collection Time: 07/26/20  8:52 AM  Result Value Ref Range   WBC 10.9 (H) 4.0 - 10.5 K/uL   RBC 4.47 3.87 - 5.11 MIL/uL   Hemoglobin 12.0 12.0 - 15.0 g/dL   HCT 35.5 97.4 - 16.3 %   MCV 91.3 80.0 - 100.0 fL   MCH 26.8 26.0 - 34.0 pg   MCHC 29.4 (L) 30.0 - 36.0 g/dL   RDW 84.5 36.4 - 68.0 %   Platelets 191 150 - 400 K/uL   nRBC 0.0 0.0 - 0.2 %    Comment: Performed at [facility], [address] [place], [place] [address]  Differential     Status: Abnormal   Collection Time: 07/26/20  8:52 AM  Result Value Ref Range   Neutrophils Relative % 76 %   Neutro Abs 8.4 (H) 1.7 - 7.7 K/uL   Lymphocytes Relative 16 %   Lymphs Abs 1.7 0.7 - 4.0 K/uL   Monocytes Relative 7 %   Monocytes Absolute 0.8 0.1 - 1.0 K/uL   Eosinophils Relative 0 %   Eosinophils Absolute 0.0 0.0 - 0.5 K/uL   Basophils Relative 0 %   Basophils Absolute 0.0 0.0 - 0.1 K/uL   Immature Granulocytes 1 %   Abs Immature Granulocytes 0.07 0.00 - 0.07 K/uL    Comment: Performed at [facility], [address], [place], [place] [address]  Comprehensive metabolic panel     Status: Abnormal   Collection Time: 07/26/20  8:52 AM  Result Value Ref Range   Sodium 139 135 - 145 mmol/L   Potassium 3.4 (L) 3.5 - 5.1 mmol/L   Chloride 103 98 - 111 mmol/L   CO2 20 (L) 22 - 32 mmol/L   Glucose, Bld 127 (H) 70 - 99 mg/dL    Comment: Glucose reference range applies only to samples taken after fasting for at least 8 hours.   BUN 17 8 - 23 mg/dL   Creatinine, Ser 0.37 (H) 0.44 - 1.00 mg/dL   Calcium 04.8 8.9 - 88.9 mg/dL   Total Protein 7.5 6.5 - 8.1 g/dL   Albumin 3.6 3.5 - 5.0 g/dL   AST 26 15 - 41 U/L   ALT 18 0 - 44 U/L   Alkaline Phosphatase 70 38 - 126 U/L   Total Bilirubin 1.1 0.3 - 1.2 mg/dL   GFR, Estimated 54 (L) >60 mL/min    Comment: (NOTE) Calculated using the CKD-EPI  Creatinine  Equation (2021)    Anion gap 16 (H) 5 - 15    Comment: Performed at [facility], [address] [place], [place] [address]  CBG monitoring, ED     Status: Abnormal   Collection Time: 07/26/20  8:53 AM  Result Value Ref Range   Glucose-Capillary 133 (H) 70 - 99 mg/dL    Comment: Glucose reference range applies only to samples taken after fasting for at least 8 hours.  I-stat chem 8, ED     Status: Abnormal   Collection Time: 07/26/20  8:59 AM  Result Value Ref Range   Sodium 140 135 - 145 mmol/L   Potassium 3.4 (L) 3.5 - 5.1 mmol/L   Chloride 107 98 - 111 mmol/L   BUN 21 8 - 23 mg/dL   Creatinine, Ser 3.66 0.44 - 1.00 mg/dL   Glucose, Bld 440 (H) 70 - 99 mg/dL    Comment: Glucose reference range applies only to samples taken after fasting for at least 8 hours.   Calcium, Ion 1.12 (L) 1.15 - 1.40 mmol/L   TCO2 25 22 - 32 mmol/L   Hemoglobin 12.6 12.0 - 15.0 g/dL   HCT 34.7 42.5 - 95.6 %   DG Forearm Right  Result Date: 07/26/2020 CLINICAL DATA:  Right wrist pain after fall. EXAM: RIGHT FOREARM - 2 VIEW COMPARISON:  September 08, 2017. FINDINGS: Moderately displaced fracture is seen involving the distal radius laterally with intra-articular extension. Mildly displaced ulnar styloid fracture is noted as well. IMPRESSION: Moderately displaced distal right radial fracture with intra-articular extension. Mildly displaced ulnar styloid fracture. Electronically Signed   By: Lupita Raider M.D.   On: 07/26/2020 10:26   MR BRAIN W WO CONTRAST  Result Date: 07/26/2020 CLINICAL DATA:  Neuro deficit, acute stroke suspected. EXAM: MRI HEAD WITHOUT AND WITH CONTRAST TECHNIQUE: Multiplanar, multiecho pulse sequences of the brain and surrounding structures were obtained without and with intravenous contrast. CONTRAST:  7.54mL GADAVIST GADOBUTROL 1 MMOL/ML IV SOLN COMPARISON:  Same day CT code stroke imaging. FINDINGS: Brain: No acute infarct. Small (4 mm) CSF intensity left cerebral convexity fluid  collection. Approximately 3 mm of rightward midline shift at the foramina McDonald. Basal cisterns are patent. Moderate cerebral atrophy with ex vacuo ventricular dilation. No hydrocephalus. No mass lesion. No abnormal enhancement on the motion limited postcontrast imaging. Numerous small foci of susceptibility artifact within the posterior right temporal and right parieto-occipital region without associated edema or hyperdensity on same day CT head. Vascular: Major arterial flow voids are maintained at the skull base. Better evaluated on same day CTA. Skull and upper cervical spine: Normal marrow signal. Sinuses/Orbits: Right cheek/periorbital contusion with facial fractures better characterized on same day CT head. Mucosal thickening and hemorrhage/fluid within the right maxillary sinus. Bilateral staphylomas. Other: No mastoid effusions. IMPRESSION: 1. No acute infarct. 2. 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 Possible trace superimposed recent hemorrhage was better evaluated on the same day CT head. Approximately 3 mm of resulting rightward midline shift. 3. Right cheek/periorbital contusion with facial fractures better characterized on same day CT head. 4. Moderate chronic microvascular ischemic disease. 5. Numerous small foci of susceptibility artifact within the posterior right temporal and right parieto-occipital region without associated edema or hyperdensity on same day CT head, compatible with prior microhemorrhages. The focality of this finding is atypical for hypertensive hemorrhages, amyloid angiopathy or prior emboli. Prior trauma is a  differential consideration. Electronically Signed   By: Feliberto HartsFrederick S Jones MD   On: 07/26/2020 12:24   CT C-SPINE NO CHARGE  Result Date: 07/26/2020 CLINICAL DATA:  Fall EXAM: CT CERVICAL SPINE WITH CONTRAST TECHNIQUE: CT of the cervical spine was reconstructed from same day CTA of the  neck with contrast. CONTRAST:  None additional COMPARISON:  None. FINDINGS: Alignment: Mild degenerative retrolisthesis at C4-C5 and C5-C6. Skull base and vertebrae: No acute cervical spine fracture. Soft tissues and spinal canal: No prevertebral fluid or swelling. No visible canal hematoma. Disc levels: Multilevel degenerative changes are present including disc space narrowing, endplate osteophytes, and facet and uncovertebral hypertrophy. No high-grade osseous encroachment on the spinal canal. Multilevel foraminal narrowing. Upper chest: Included lung apices are clear. Other: Vascular findings are better evaluated on the CTA portion. IMPRESSION: No acute cervical spine fracture. Electronically Signed   By: Guadlupe SpanishPraneil  Patel M.D.   On: 07/26/2020 10:50   DG Hip Unilat  With Pelvis 2-3 Views Right  Result Date: 07/26/2020 CLINICAL DATA:  Right hip injury after fall. EXAM: DG HIP (WITH OR WITHOUT PELVIS) 2-3V RIGHT COMPARISON:  May 12, 2011. FINDINGS: There is no evidence of hip fracture or dislocation. Moderate narrowing and osteophyte formation is seen involving the right hip. IMPRESSION: Moderate osteoarthritis of the right hip. No acute abnormality is noted. Electronically Signed   By: Lupita RaiderJames  Green Jr M.D.   On: 07/26/2020 10:28   CT HEAD CODE STROKE WO CONTRAST  Result Date: 07/26/2020 CLINICAL DATA:  Code stroke.  Neuro deficit, acute stroke suspected. EXAM: CT HEAD WITHOUT CONTRAST TECHNIQUE: Contiguous axial images were obtained from the base of the skull through the vertex without intravenous contrast. COMPARISON:  None. FINDINGS: Brain: Small right thalamocapsular hypodensity could represent a an age-indeterminate lacunar infarct. No evidence of acute large vascular territory infarct. Moderate patchy white matter hypoattenuation, most likely related to chronic microvascular ischemic disease. Small (3 mm) largely low density left cerebral convexity subdural collection. Suspected trace hyperdensity  posteriorly within the collection, concerning for acute/recent hemorrhage. Approximately 2 mm of rightward midline shift. Generalized cerebral volume loss with ex vacuo ventricular dilation. No hydrocephalus. No mass lesion. Vascular: No hyperdense vessel identified. Skull: No acute fracture. Sinuses/Orbits: Right periorbital and pre maxillary soft tissue contusion. Fracture of all walls of the right maxillary sinus with layering hemosinus. Fractures of the right lateral orbital wall and orbital floor with herniation a small amount of fat through the orbital floor defect. The infraorbital foramen is involved. Bilateral staphylomas. Other: No mastoid effusions. ASPECTS [facility]([place] Stroke Program Early CT Score) Total score (0-10 with 10 being normal): 9 IMPRESSION: 1. Small right thalamocapsular hypodensity could represent an age indeterminate lacunar infarct. MRI could further evaluate if clinically indicated. ASPECTS 9 at worst. 2. 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 Suspected trace hyperdensity posteriorly within the collection, concerning for acute/recent hemorrhage. Approximately 2 mm of rightward midline shift. 3. Right periorbital and pre maxillary soft tissue contusion. Fracture of all walls of the right maxillary sinus with layering hemosinus. Additional fractures of the right lateral orbital wall and orbital floor with herniation a small amount of fat through the orbital floor defect and involvement of the infraorbital foramen. Dedicated maxillofacial CT could further characterize if clinically indicated. 4. 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 Findings discussed with Dr. Iver NestleBhagat via telephone at 9:12 PM. Electronically Signed   By: Feliberto HartsFrederick S Jones MD  On: 07/26/2020 09:31   CT ANGIO HEAD NECK W WO CM W PERF (CODE STROKE)  Result Date: 07/26/2020 CLINICAL DATA:  Neuro  deficit, acute stroke suspected. EXAM: CT HEAD WITHOUT CONTRAST CT ANGIOGRAPHY HEAD AND NECK CT PERFUSION BRAIN TECHNIQUE: Multidetector CT imaging of the head and neck was performed using the standard protocol during bolus administration of intravenous contrast. Multiplanar CT image reconstructions and MIPs were obtained to evaluate the vascular anatomy. Carotid stenosis measurements (when applicable) are obtained utilizing NASCET criteria, using the distal internal carotid diameter as the denominator. Multiphase CT imaging of the brain was performed following IV bolus contrast injection. Subsequent parametric perfusion maps were calculated using RAPID software. CONTRAST:  OMNIPAQUE IOHEXOL 350 MG/ML SOLN COMPARISON:  None. FINDINGS: CTA NECK FINDINGS Aortic arch: Great vessel origins are patent. Right carotid system: No evidence of dissection, stenosis (50% or greater) or occlusion. Left carotid system: Approximately 50% stenosis of the proximal ICA due to predominately calcific atherosclerosis.w Vertebral arteries: Left dominant. No significant stenosis. Mild atherosclerosis of the proximal left vertebral artery. Skeleton: Mild to moderate multilevel degenerative change. Other neck: No acute findings. Upper chest: Visualized lung apices are clear. Review of the MIP images confirms the above findings CTA HEAD FINDINGS Anterior circulation: No emergent large vessel occlusion or evidence of proximal hemodynamically significant stenosis. Partially azygos ACA, anatomic variant. Small right A1 ACA. Mild for age calcific atherosclerosis of the cavernous and paraclinoid ICAs. Small (1-2 mm) inferiorly directed outpouching arising from the left supraclinoid ICA (series 11, image 117) z Posterior circulation: Small basilar artery with right fetal type PCA, anatomic variant. No large vessel occlusion. Venous sinuses: As permitted by contrast timing, patent. Anatomic variants: Right fetal type PCA. Review of the MIP  images confirms the above findings CT Brain Perfusion Findings: CBF (<30%) Volume: 0mL Perfusion (Tmax>6.0s) volume: 5mL in the posterior right occipital region Mismatch Volume: 5mL Infarction Location:None IMPRESSION: 1. No emergent large vessel occlusion or hemodynamically significant proximal stenosis intracranially. 2. Approximately 50% stenosis of the proximal left ICA secondary due to predominately calcific atherosclerosis. 3. RAPID calculates a small (5 mL) area of penumbra in the posterior right occipital region, which may be artifactual. Otherwise, normal perfusion. 4. 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 Urgent findings discussed with Dr. Iver Nestle at 9:40 a.m. via telephone Electronically Signed   By: Feliberto Harts MD   On: 07/26/2020 09:54      Assessment/Plan Multiple falls Code stroke - MRI negative for acute infarct Right wrist/Elbow fx - Dr. Merlyn Lot to see, in sugar tong splint, NWB RUE, CT scans completed.  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 Venetia Maxon, no follow up scan noted.  This is very minimal.   R maxillary sinus, R lateral orbital wall and floor fxs with fat herniation - Dr. Leta Baptist has seen.  No acute surgical intervention.  May follow up prn.  Recommends outpatient ophthalmology exam Elevated Cr - 1.08, IVF, repeat BMP in AM Abnormal appearing endometrium - noted on MR lumbar spine, will eval on CT Hx heart murmur Memory deficit - unclear if this is new or not.  May need to see neuro as an outpatient for balance issues pending therapies as well as memory issues. Hip pain - ? Laterality.  Patient unable to tell me currently and states right now neither of her hips or legs hurt.  She was apparently walking with  crutches at home. CT pelvis pending. 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 Venetia Maxon as outpatient.   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  Will need TBI therapies  Barnetta Chapel, [facility] 07/26/2020, 1:06 PM Please see Amion for pager number during day hours 7:00am-4:30pm

## 2020-07-26 NOTE — ED Provider Notes (Signed)
[facility] EMERGENCY DEPARTMENT Provider Note   CSN: [id number] Arrival date & time: 07/26/20  4098  An emergency department physician performed an initial assessment on this suspected stroke patient at 78.  History No chief complaint on file.   Danielle Kirby is a 75 y.o. female.  HPI    75 year old female presents by ambulance after calling out after another fall.  The patient evidently had a fall on Sunday, several days later the patient has been noted to not be using her right arm very well, she had another fall today where there was a possible passing out spell and she fell to the ground.  Paramedics noted a deformity to the right wrist, they noticed bruising around the right eye, she had some difficulty with speech and not using her right arm very much.  Code stroke was activated prehospital.  Level 5 caveat applies secondary to the inability of the patient to give accurate information, confusion and aphasia.   Past Medical History:  Diagnosis Date  . Allergy   . Arthritis   . Cataract   . Heart murmur     Patient Active Problem List   Diagnosis Date Noted  . 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 Degenerative arthritis of hip 05/23/2011    Past Surgical History:  Procedure Laterality Date  . BREAST SURGERY    . COSMETIC SURGERY    . EYE SURGERY    . TUBAL LIGATION       OB History   No obstetric history on file.     Family History  Problem Relation Age of Onset  . Diabetes Mother   . Hypertension Father     Social History   Tobacco Use  . Smoking status: Never Smoker  . Smokeless tobacco: Never Used  Substance Use Topics  . Alcohol use: No    Alcohol/week: 0.0 standard drinks  . Drug use: No    Home Medications Prior to Admission medications   Medication Sig Start Date End Date Taking? Authorizing Provider  OVER THE COUNTER MEDICATION daily.   Yes [provider]  methocarbamol (ROBAXIN) 500 MG tablet Take 1 tablet (500 mg total) by mouth 2 (two) times daily. Patient not taking: No sig reported 03/25/20   Lorre Nick, MD    Allergies    Penicillins  Review of Systems   Review of Systems  Unable to perform ROS: Acuity of condition    Physical Exam Updated Vital Signs BP (!) 159/108   Pulse 80   Temp 98.9 F (37.2 C) (Oral)   Resp 15   Ht 1.702 m ( )   Wt 85.2 kg   SpO2 100%   BMI 29.42 kg/m   Physical Exam Vitals and nursing note reviewed.  Constitutional:      General: She is in acute distress.     Appearance: She is well-developed.  HENT:     Head: Normocephalic.     Comments: Periorbital ecchymosis on the right    Mouth/Throat:     Pharynx: No oropharyngeal exudate.     Comments: No malocclusion or jaw tenderness, symmetrical smile Eyes:     General: No scleral icterus.       Right eye: No discharge.        Left eye: No discharge.     Conjunctiva/sclera: Conjunctivae normal.     Pupils: Pupils are equal, round, and reactive to light.  Comments: Periorbital ecchymosis  Neck:     Thyroid: No thyromegaly.     Vascular: No JVD.  Cardiovascular:     Rate and Rhythm: Normal rate and regular rhythm.     Heart sounds: Normal heart sounds. No murmur heard. No friction rub. No gallop.   Pulmonary:     Effort: Pulmonary effort is normal. No respiratory distress.     Breath sounds: Normal breath sounds. No wheezing or rales.  Abdominal:     General: Bowel sounds are normal. There is no distension.     Palpations: Abdomen is soft. There is no mass.     Tenderness: There is no abdominal tenderness.  Musculoskeletal:        General: Tenderness, deformity and signs of injury present. Normal range of motion.     Comments: Right upper extremity with tenderness and deformity around the right wrist and distal forearm  Lymphadenopathy:     Cervical: No cervical adenopathy.  Skin:    General: Skin is warm and dry.      Findings: No erythema or rash.  Neurological:     Mental Status: She is alert.     Coordination: Coordination normal.     Comments: The patient appears to have some neglect on the left, her speech is clear but occasionally confused, her peripheral visual fields are good except she does neglect to the left side.  She has some weakness of the right leg and the right arm, the right arm is limited by pain at the wrist due to deformity and likely fracture  Psychiatric:        Behavior: Behavior normal.     ED Results / Procedures / Treatments   Labs (all labs ordered are listed, but only abnormal results are displayed) Labs Reviewed  APTT - Abnormal; Notable for the following components:      Result Value   aPTT 21 (*)    All other components within normal limits  CBC - Abnormal; Notable for the following components:   WBC 10.9 (*)    MCHC 29.4 (*)    All other components within normal limits  DIFFERENTIAL - Abnormal; Notable for the following components:   Neutro Abs 8.4 (*)    All other components within normal limits  COMPREHENSIVE METABOLIC PANEL - Abnormal; Notable for the following components:   Potassium 3.4 (*)    CO2 20 (*)    Glucose, Bld 127 (*)    Creatinine, Ser 1.08 (*)    GFR, Estimated 54 (*)    Anion gap 16 (*)    All other components within normal limits  CBG MONITORING, ED - Abnormal; Notable for the following components:   Glucose-Capillary 133 (*)    All other components within normal limits  I-STAT CHEM 8, ED - Abnormal; Notable for the following components:   Potassium 3.4 (*)    Glucose, Bld 129 (*)    Calcium, Ion 1.12 (*)    All other components within normal limits  RESP PANEL BY RT-PCR (FLU A&B, COVID) ARPGX2  ETHANOL  PROTIME-INR  RAPID URINE DRUG SCREEN, HOSP PERFORMED  URINALYSIS, ROUTINE W REFLEX MICROSCOPIC    EKG EKG Interpretation  Date/Time:  Thursday July 26 2020 09:34:59 EDT Ventricular Rate:  81 PR Interval:  143 QRS  Duration: 88 QT Interval:  432 QTC Calculation: 502 R Axis:   167 Text Interpretation: Right and left arm electrode reversal, interpretation assumes no reversal Sinus rhythm Probable left atrial enlargement Right axis deviation  Consider left ventricular hypertrophy Abnormal T, consider ischemia, lateral leads Prolonged QT interval Confirmed by Eber HongMiller, Yolanda Huffstetler ([id number]) on 07/26/2020 9:41:15 AM   Radiology DG Forearm Right  Result Date: 07/26/2020 CLINICAL DATA:  Right wrist pain after fall. EXAM: RIGHT FOREARM - 2 VIEW COMPARISON:  September 08, 2017. FINDINGS: Moderately displaced fracture is seen involving the distal radius laterally with intra-articular extension. Mildly displaced ulnar styloid fracture is noted as well. IMPRESSION: Moderately displaced distal right radial fracture with intra-articular extension. Mildly displaced ulnar styloid fracture. Electronically Signed   By: Lupita RaiderJames  Green Jr M.D.   On: 07/26/2020 10:26   CT C-SPINE NO CHARGE  Result Date: 07/26/2020 CLINICAL DATA:  Fall EXAM: CT CERVICAL SPINE WITH CONTRAST TECHNIQUE: CT of the cervical spine was reconstructed from same day CTA of the neck with contrast. CONTRAST:  None additional COMPARISON:  None. FINDINGS: Alignment: Mild degenerative retrolisthesis at C4-C5 and C5-C6. Skull base and vertebrae: No acute cervical spine fracture. Soft tissues and spinal canal: No prevertebral fluid or swelling. No visible canal hematoma. Disc levels: Multilevel degenerative changes are present including disc space narrowing, endplate osteophytes, and facet and uncovertebral hypertrophy. No high-grade osseous encroachment on the spinal canal. Multilevel foraminal narrowing. Upper chest: Included lung apices are clear. Other: Vascular findings are better evaluated on the CTA portion. IMPRESSION: No acute cervical spine fracture. Electronically Signed   By: Guadlupe SpanishPraneil  Patel M.D.   On: 07/26/2020 10:50   DG Hip Unilat  With Pelvis 2-3 Views Right  Result  Date: 07/26/2020 CLINICAL DATA:  Right hip injury after fall. EXAM: DG HIP (WITH OR WITHOUT PELVIS) 2-3V RIGHT COMPARISON:  May 12, 2011. FINDINGS: There is no evidence of hip fracture or dislocation. Moderate narrowing and osteophyte formation is seen involving the right hip. IMPRESSION: Moderate osteoarthritis of the right hip. No acute abnormality is noted. Electronically Signed   By: Lupita RaiderJames  Green Jr M.D.   On: 07/26/2020 10:28   CT HEAD CODE STROKE WO CONTRAST  Result Date: 07/26/2020 CLINICAL DATA:  Code stroke.  Neuro deficit, acute stroke suspected. EXAM: CT HEAD WITHOUT CONTRAST TECHNIQUE: Contiguous axial images were obtained from the base of the skull through the vertex without intravenous contrast. COMPARISON:  None. FINDINGS: Brain: Small right thalamocapsular hypodensity could represent a an age-indeterminate lacunar infarct. No evidence of acute large vascular territory infarct. Moderate patchy white matter hypoattenuation, most likely related to chronic microvascular ischemic disease. Small (3 mm) largely low density left cerebral convexity subdural collection. Suspected trace hyperdensity posteriorly within the collection, concerning for acute/recent hemorrhage. Approximately 2 mm of rightward midline shift. Generalized cerebral volume loss with ex vacuo ventricular dilation. No hydrocephalus. No mass lesion. Vascular: No hyperdense vessel identified. Skull: No acute fracture. Sinuses/Orbits: Right periorbital and pre maxillary soft tissue contusion. Fracture of all walls of the right maxillary sinus with layering hemosinus. Fractures of the right lateral orbital wall and orbital floor with herniation a small amount of fat through the orbital floor defect. The infraorbital foramen is involved. Bilateral staphylomas. Other: No mastoid effusions. ASPECTS [facility]([place] Stroke Program Early CT Score) Total score (0-10 with 10 being normal): 9 IMPRESSION: 1. Small right thalamocapsular hypodensity  could represent an age indeterminate lacunar infarct. MRI could further evaluate if clinically indicated. ASPECTS 9 at worst. 2. 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 Suspected trace hyperdensity posteriorly within the collection, concerning for acute/recent hemorrhage. Approximately 2 mm of  rightward midline shift. 3. Right periorbital and pre maxillary soft tissue contusion. Fracture of all walls of the right maxillary sinus with layering hemosinus. Additional fractures of the right lateral orbital wall and orbital floor with herniation a small amount of fat through the orbital floor defect and involvement of the infraorbital foramen. Dedicated maxillofacial CT could further characterize if clinically indicated. 4. 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 Findings discussed with Dr. Iver Nestle via telephone at 9:12 PM. Electronically Signed   By: Feliberto Harts MD   On: 07/26/2020 09:31   CT ANGIO HEAD NECK W WO CM W PERF (CODE STROKE)  Result Date: 07/26/2020 CLINICAL DATA:  Neuro deficit, acute stroke suspected. EXAM: CT HEAD WITHOUT CONTRAST CT ANGIOGRAPHY HEAD AND NECK CT PERFUSION BRAIN TECHNIQUE: Multidetector CT imaging of the head and neck was performed using the standard protocol during bolus administration of intravenous contrast. Multiplanar CT image reconstructions and MIPs were obtained to evaluate the vascular anatomy. Carotid stenosis measurements (when applicable) are obtained utilizing NASCET criteria, using the distal internal carotid diameter as the denominator. Multiphase CT imaging of the brain was performed following IV bolus contrast injection. Subsequent parametric perfusion maps were calculated using RAPID software. CONTRAST:  OMNIPAQUE IOHEXOL 350 MG/ML SOLN COMPARISON:  None. FINDINGS: CTA NECK FINDINGS Aortic arch: Great vessel origins are patent.  Right carotid system: No evidence of dissection, stenosis (50% or greater) or occlusion. Left carotid system: Approximately 50% stenosis of the proximal ICA due to predominately calcific atherosclerosis.w Vertebral arteries: Left dominant. No significant stenosis. Mild atherosclerosis of the proximal left vertebral artery. Skeleton: Mild to moderate multilevel degenerative change. Other neck: No acute findings. Upper chest: Visualized lung apices are clear. Review of the MIP images confirms the above findings CTA HEAD FINDINGS Anterior circulation: No emergent large vessel occlusion or evidence of proximal hemodynamically significant stenosis. Partially azygos ACA, anatomic variant. Small right A1 ACA. Mild for age calcific atherosclerosis of the cavernous and paraclinoid ICAs. Small (1-2 mm) inferiorly directed outpouching arising from the left supraclinoid ICA (series 11, image 117) z Posterior circulation: Small basilar artery with right fetal type PCA, anatomic variant. No large vessel occlusion. Venous sinuses: As permitted by contrast timing, patent. Anatomic variants: Right fetal type PCA. Review of the MIP images confirms the above findings CT Brain Perfusion Findings: CBF (<30%) Volume: 66mL Perfusion (Tmax>6.0s) volume: 41mL in the posterior right occipital region Mismatch Volume: 28mL Infarction Location:None IMPRESSION: 1. No emergent large vessel occlusion or hemodynamically significant proximal stenosis intracranially. 2. Approximately 50% stenosis of the proximal left ICA secondary due to predominately calcific atherosclerosis. 3. RAPID calculates a small (5 mL) area of penumbra in the posterior right occipital region, which may be artifactual. Otherwise, normal perfusion. 4. 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 Urgent findings discussed with Dr. Iver Nestle at 9:40 a.m. via telephone Electronically Signed   By: Feliberto Harts MD   On:  07/26/2020 09:54    Procedures .Critical Care Performed by: Eber Hong, MD Authorized by: Eber Hong, MD   Critical care provider statement:    Critical care time (minutes):  35   Critical care time was exclusive of:  Separately billable procedures and treating other patients and teaching time   Critical care was necessary to treat or prevent imminent or life-threatening deterioration of the following conditions:  Trauma and CNS failure or compromise   Critical care was time spent personally by me on the following activities:  Blood  draw for specimens, development of treatment plan with patient or surrogate, discussions with consultants, evaluation of patient's response to treatment, examination of patient, obtaining history from patient or surrogate, ordering and performing treatments and interventions, ordering and review of laboratory studies, ordering and review of radiographic studies, pulse oximetry, re-evaluation of patient's condition and review of old charts     Medications Ordered in ED Medications  iohexol (OMNIPAQUE) 350 MG/ML injection 100 mL (100 mLs Intravenous Contrast Given 07/26/20 0926)    ED Course  I have reviewed the triage vital signs and the nursing notes.  Pertinent labs & imaging results that were available during my care of the patient were reviewed by me and considered in my medical decision making (see chart for details).  Clinical Course as of 07/26/20 1100  Thu Jul 26, 2020  0940 CT shows a small area of SDH - given the recent history of fall - suspect traumatic SDH.  Will d/w NS as there is an element of trauma - she has some shift on CT as well. [BM]  0940 Discussed with neurosurgery at 10:25 AM, they recommend repeat CT scan in the morning, no other active intervention on the small subdural hematoma that is seen.  Labs reviewed, the patient has minimal hypokalemia, no other significa electrolyte abnormalities, CBC is also unremarkable without  significant anemia leukocytosis or thrombocytopenia.  nt [BM]  1053 Discussed with Trauma surgery through OR Nurse - request for Trauma admit  Discussed with michael covering for hand - will come to see re: forearm fracture  ENT paged [BM]  1100 ENT contacted - will see in person this afternoon. [BM]    Clinical Course User Index [BM] Eber Hong, MD   MDM Rules/Calculators/A&P                          Patient is ill-appearing, appears to have had a stroke, also appears to have had significant trauma, it is unclear whether the trauma caused an intracranial hemorrhage or whether this is an ischemic stroke to cause the fall or some combination.  At this time she will go to CT scanner, CT scan of the brain, cervical spine and laboratory work-up will be ensued.  She will also need an x-ray of her wrist which appears deformed  I was present at the bedside upon patient's arrival to the emergency department, code stroke was activated prehospital, neurology at the bedside as well    Final Clinical Impression(s) / ED Diagnoses Final diagnoses:  SDH (subdural hematoma) (HCC)  Maxillary sinus fracture, closed, initial encounter (HCC)  Other closed intra-articular fracture of distal end of right radius, initial encounter      Eber Hong, MD 07/26/20 1101

## 2020-07-26 NOTE — Consult Note (Signed)
Reason for Consult: SDH Referring Physician: Eber Hong, MD   HPI: Danielle Kirby is a 75 y.o. female who was BIB EMS to [facility] as a cod stroke. She was found down this morning by her husband after a suspected ground level fall. Initially she was able to ambulate. However, she fell again and her husband subsequently engaged the emergency response system. On scene, EMS noted bruising and deformity to the right wrist, diminished use of the RUE, bruising around the right eye, and some difficulty with speech. Code stroke was activated by EMS. 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   Past Medical History:  Diagnosis Date  . Allergy   . Arthritis   . Cataract   . Heart murmur     Past Surgical History:  Procedure Laterality Date  . BREAST SURGERY    . COSMETIC SURGERY    . EYE SURGERY    . TUBAL LIGATION      Family History  Problem Relation Age of Onset  . Diabetes Mother   . Hypertension Father     Social History:  reports that she has never smoked. She has never used smokeless tobacco. She reports that she does not drink alcohol and does not use drugs.  Allergies:  Allergies  Allergen Reactions  . Penicillins     REACTION: hives    Medications: I have reviewed the patient's current medications.  Results for orders placed or performed during the hospital encounter of 07/26/20 (from the past 48 hour(s))  Resp Panel by RT-PCR (Flu A&B, Covid) Nasopharyngeal Swab     Status: None   Collection Time: 07/26/20  8:52 AM   Specimen: Nasopharyngeal Swab; Nasopharyngeal(NP) swabs in vial transport medium  Result Value Ref Range   SARS Coronavirus 2 by RT PCR NEGATIVE NEGATIVE    Comment: (NOTE) SARS-CoV-2 target nucleic acids are NOT DETECTED.  The SARS-CoV-2 RNA is generally detectable in upper respiratory specimens  during the acute phase of infection. The lowest concentration of SARS-CoV-2 viral copies this assay can detect is 138 copies/mL. A negative result does not preclude SARS-Cov-2 infection and should not be used as the sole basis for treatment or other patient management decisions. A negative result may occur with  improper specimen collection/handling, submission of specimen other than nasopharyngeal swab, presence of viral mutation(s) within the areas targeted by this assay, and inadequate number of viral copies(<138 copies/mL). A negative result must be combined with clinical observations, patient history, and epidemiological information. The expected result is Negative.  Fact Sheet for Patients:  BloggerCourse.com  Fact Sheet for Healthcare Providers:  SeriousBroker.it  This test is no t yet approved or cleared by the [place] FDA and  has been authorized for detection and/or diagnosis of SARS-CoV-2 by FDA under an Emergency Use Authorization (EUA). This EUA will remain  in effect (meaning this test can be used) for the duration of the COVID-19 declaration under Section 564(b)(1) of the Act, 21 U.S.C.section 360bbb-3(b)(1), unless the authorization is terminated  or revoked sooner.       Influenza A by PCR NEGATIVE NEGATIVE   Influenza B by PCR NEGATIVE NEGATIVE    Comment: (NOTE) The Xpert Xpress SARS-CoV-2/FLU/RSV plus assay is intended as an aid in the diagnosis of influenza from Nasopharyngeal swab specimens and should not be used as a sole basis for treatment. Nasal washings and  aspirates are unacceptable for Xpert Xpress SARS-CoV-2/FLU/RSV testing.  Fact Sheet for Patients: BloggerCourse.comhttps://www.fda.gov/media/152166/download  Fact Sheet for Healthcare Providers: SeriousBroker.ithttps://www.fda.gov/media/152162/download  This test is not yet approved or cleared by the [place] FDA and has been authorized for detection and/or diagnosis  of SARS-CoV-2 by FDA under an Emergency Use Authorization (EUA). This EUA will remain in effect (meaning this test can be used) for the duration of the COVID-19 declaration under Section 564(b)(1) of the Act, 21 U.S.C. section 360bbb-3(b)(1), unless the authorization is terminated or revoked.  Performed at [facility], [address] [place], [place] [address]   Ethanol     Status: None   Collection Time: 07/26/20  8:52 AM  Result Value Ref Range   Alcohol, Ethyl (B) <10 <10 mg/dL    Comment: (NOTE) Lowest detectable limit for serum alcohol is 10 mg/dL.  For medical purposes only. Performed at [facility], [address] [place], [place] [address]   Protime-INR     Status: None   Collection Time: 07/26/20  8:52 AM  Result Value Ref Range   Prothrombin Time 14.2 11.4 - 15.2 seconds   INR 1.1 0.8 - 1.2    Comment: (NOTE) INR goal varies based on device and disease states. Performed at [facility], [address] [place], [place] [address]   APTT     Status: Abnormal   Collection Time: 07/26/20  8:52 AM  Result Value Ref Range   aPTT 21 (L) 24 - 36 seconds    Comment: Performed at [facility], [address] [place], [place] [address]  CBC     Status: Abnormal   Collection Time: 07/26/20  8:52 AM  Result Value Ref Range   WBC 10.9 (H) 4.0 - 10.5 K/uL   RBC 4.47 3.87 - 5.11 MIL/uL   Hemoglobin 12.0 12.0 - 15.0 g/dL   HCT 65.740.8 84.636.0 - 96.246.0 %   MCV 91.3 80.0 - 100.0 fL   MCH 26.8 26.0 - 34.0 pg   MCHC 29.4 (L) 30.0 - 36.0 g/dL   RDW 95.214.3 84.111.5 - 32.415.5 %   Platelets 191 150 - 400 K/uL   nRBC 0.0 0.0 - 0.2 %    Comment: Performed at [facility], [address] [place], [place] [address]  Differential     Status: Abnormal   Collection Time: 07/26/20  8:52 AM  Result Value Ref Range   Neutrophils Relative % 76 %   Neutro Abs 8.4 (H) 1.7 - 7.7 K/uL   Lymphocytes Relative 16 %   Lymphs Abs 1.7 0.7 - 4.0 K/uL   Monocytes Relative 7 %    Monocytes Absolute 0.8 0.1 - 1.0 K/uL   Eosinophils Relative 0 %   Eosinophils Absolute 0.0 0.0 - 0.5 K/uL   Basophils Relative 0 %   Basophils Absolute 0.0 0.0 - 0.1 K/uL   Immature Granulocytes 1 %   Abs Immature Granulocytes 0.07 0.00 - 0.07 K/uL    Comment: Performed at [facility], [address] [place], [place] [address]  Comprehensive metabolic panel     Status: Abnormal   Collection Time: 07/26/20  8:52 AM  Result Value Ref Range   Sodium 139 135 - 145 mmol/L   Potassium 3.4 (L) 3.5 - 5.1 mmol/L   Chloride 103 98 - 111 mmol/L   CO2 20 (L) 22 - 32 mmol/L   Glucose, Bld 127 (H) 70 - 99 mg/dL  Comment: Glucose reference range applies only to samples taken after fasting for at least 8 hours.   BUN 17 8 - 23 mg/dL   Creatinine, Ser 1.30 (H) 0.44 - 1.00 mg/dL   Calcium 86.5 8.9 - 78.4 mg/dL   Total Protein 7.5 6.5 - 8.1 g/dL   Albumin 3.6 3.5 - 5.0 g/dL   AST 26 15 - 41 U/L   ALT 18 0 - 44 U/L   Alkaline Phosphatase 70 38 - 126 U/L   Total Bilirubin 1.1 0.3 - 1.2 mg/dL   GFR, Estimated 54 (L) >60 mL/min    Comment: (NOTE) Calculated using the CKD-EPI Creatinine Equation (2021)    Anion gap 16 (H) 5 - 15    Comment: Performed at [facility], [address], [place], [place] [address]  CBG monitoring, ED     Status: Abnormal   Collection Time: 07/26/20  8:53 AM  Result Value Ref Range   Glucose-Capillary 133 (H) 70 - 99 mg/dL    Comment: Glucose reference range applies only to samples taken after fasting for at least 8 hours.  I-stat chem 8, ED     Status: Abnormal   Collection Time: 07/26/20  8:59 AM  Result Value Ref Range   Sodium 140 135 - 145 mmol/L   Potassium 3.4 (L) 3.5 - 5.1 mmol/L   Chloride 107 98 - 111 mmol/L   BUN 21 8 - 23 mg/dL   Creatinine, Ser 5.28 0.44 - 1.00 mg/dL   Glucose, Bld 413 (H) 70 - 99 mg/dL    Comment: Glucose reference range applies only to samples taken after fasting for at least 8 hours.   Calcium, Ion 1.12 (L) 1.15 -  1.40 mmol/L   TCO2 25 22 - 32 mmol/L   Hemoglobin 12.6 12.0 - 15.0 g/dL   HCT 24.4 01.0 - 27.2 %    DG Forearm Right  Result Date: 07/26/2020 CLINICAL DATA:  Right wrist pain after fall. EXAM: RIGHT FOREARM - 2 VIEW COMPARISON:  September 08, 2017. FINDINGS: Moderately displaced fracture is seen involving the distal radius laterally with intra-articular extension. Mildly displaced ulnar styloid fracture is noted as well. IMPRESSION: Moderately displaced distal right radial fracture with intra-articular extension. Mildly displaced ulnar styloid fracture. Electronically Signed   By: Lupita Raider M.D.   On: 07/26/2020 10:26   MR BRAIN W WO CONTRAST  Result Date: 07/26/2020 CLINICAL DATA:  Neuro deficit, acute stroke suspected. EXAM: MRI HEAD WITHOUT AND WITH CONTRAST TECHNIQUE: Multiplanar, multiecho pulse sequences of the brain and surrounding structures were obtained without and with intravenous contrast. CONTRAST:  7.47mL GADAVIST GADOBUTROL 1 MMOL/ML IV SOLN COMPARISON:  Same day CT code stroke imaging. FINDINGS: Brain: No acute infarct. Small (4 mm) CSF intensity left cerebral convexity fluid collection. Approximately 3 mm of rightward midline shift at the foramina [place]. Basal cisterns are patent. Moderate cerebral atrophy with ex vacuo ventricular dilation. No hydrocephalus. No mass lesion. No abnormal enhancement on the motion limited postcontrast imaging. Numerous small foci of susceptibility artifact within the posterior right temporal and right parieto-occipital region without associated edema or hyperdensity on same day CT head. Vascular: Major arterial flow voids are maintained at the skull base. Better evaluated on same day CTA. Skull and upper cervical spine: Normal marrow signal. Sinuses/Orbits: Right cheek/periorbital contusion with facial fractures better characterized on same day CT head. Mucosal thickening and hemorrhage/fluid within the right maxillary sinus. Bilateral staphylomas. Other:  No mastoid effusions. IMPRESSION: 1. No acute  infarct. 2. 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 Possible trace superimposed recent hemorrhage was better evaluated on the same day CT head. Approximately 3 mm of resulting rightward midline shift. 3. Right cheek/periorbital contusion with facial fractures better characterized on same day CT head. 4. Moderate chronic microvascular ischemic disease. 5. Numerous small foci of susceptibility artifact within the posterior right temporal and right parieto-occipital region without associated edema or hyperdensity on same day CT head, compatible with prior microhemorrhages. The focality of this finding is atypical for hypertensive hemorrhages, amyloid angiopathy or prior emboli. Prior trauma is a differential consideration. Electronically Signed   By: Feliberto Harts MD   On: 07/26/2020 12:24   MR LUMBAR SPINE WO CONTRAST  Result Date: 07/26/2020 CLINICAL DATA:  Low back pain, trauma.  Fall. EXAM: MRI LUMBAR SPINE WITHOUT CONTRAST TECHNIQUE: Multiplanar, multisequence MR imaging of the lumbar spine was performed. No intravenous contrast was administered. COMPARISON:  None. FINDINGS: Segmentation:  Standard segmentation is assumed. Alignment:  No substantial malalignment. Vertebrae: Vertebral body heights are maintained. No focal marrow edema to suggest acute fracture. Conus medullaris and cauda equina: Conus extends to approximately the T12-L1 level. Conus is unremarkable and only imaged sagittally. Paraspinal and other soft tissues: Abnormal appearance of the endometrium (series 13, image 12), partially imaged. There is mild soft tissue edema about the right L2-L3 facet, likely related to facet arthropathy. Disc levels: T12-L1: Only imaged sagittally without evidence of significant canal or foraminal stenosis. L1-L2: Disc bulging and mild bilateral facet hypertrophy without significant  canal or foraminal stenosis. L2-L3: Bilateral foraminal disc protrusions and mild bilateral facet hypertrophy without significant canal or foraminal stenosis. L3-L4: Broad disc bulge and mild bilateral facet hypertrophy and ligamentum flavum thickening. Mild bilateral subarticular recess stenosis without significant canal or foraminal stenosis. L4-L5: Broad disc bulge and mild bilateral facet hypertrophy and ligamentum flavum thickening. Mild bilateral subarticular recess stenosis without significant canal stenosis. Mild left foraminal stenosis. L5-S1: Broad disc bulge and mild bilateral facet hypertrophy. Mild bilateral subarticular recess stenosis without significant canal or foraminal stenosis. IMPRESSION: 1. Abnormal appearance of the endometrium (series 13, image 12), partially imaged but suspicious for cancer. Recommend gynecology consultation and dedicated evaluation. 2. No bone marrow edema to suggest acute fracture on this motion limited MRI. CT could better characterize osseous detail if clinically indicated. 3. Mild multilevel degenerative change with mild left foraminal stenosis at L4-L5 and mild multilevel subarticular recess stenosis, as detailed above. No significant canal stenosis. 4. Mild right L2-L3 perifacet edema, likely related to facet arthropathy. These results will be called to the ordering clinician or representative by the Radiologist Assistant, and communication documented in the PACS or [employer]. Electronically Signed   By: Feliberto Harts MD   On: 07/26/2020 13:17   CT WRIST RIGHT WO CONTRAST  Result Date: 07/26/2020 CLINICAL DATA:  Fall, right wrist fracture EXAM: CT OF THE RIGHT WRIST WITHOUT CONTRAST TECHNIQUE: Multidetector CT imaging of the right wrist was performed according to the standard protocol. Multiplanar CT image reconstructions were also generated. COMPARISON:  Right forearm x-ray 05/28/2020 FINDINGS: Bones/Joint/Cartilage Comminuted, mildly impacted  intra-articular fracture of the distal radius. Approximately 4 mm of dorsal displacement of the dorsal fracture fragment. Slight dorsal angulation. Radiocarpal and distal radioulnar joints remain aligned. Minimally displaced fracture through the base of the ulnar styloid. Carpal bones appear intact. No carpal bone malalignment. Mild-to-moderate degenerative changes of the first [facility] and triscaphe joints. Ligaments  Suboptimally assessed by CT. Muscles and Tendons Poorly evaluated secondary to beam hardening artifact related to patient positioning. Soft tissues Soft tissue swelling. No obvious hematoma. Beam hardening artifact degrades evaluation. IMPRESSION: 1. Comminuted, mildly impacted intra-articular fracture of the distal radius. 2. Minimally displaced ulnar styloid fracture. Electronically Signed   By: Duanne Guess D.O.   On: 07/26/2020 13:50   CT ELBOW RIGHT WO CONTRAST  Result Date: 07/26/2020 CLINICAL DATA:  Right elbow pain after a fall. Question fracture. Initial encounter. EXAM: CT OF THE LOWER RIGHT EXTREMITY WITHOUT CONTRAST TECHNIQUE: Multidetector CT imaging of the right lower extremity was performed according to the standard protocol. COMPARISON:  Plain films right forearm. FINDINGS: Bones/Joint/Cartilage The patient has an acute fracture of the radial head and neck. The main fracture line is through the ulnar 1/3 of the articular surface of the radial head and exits the anterior margin of the radial neck at the metaphysis. Gap at the articular surface measures approximately 0.4 cm. Also seen is a cortical fracture off the posterior aspect of the capitellum with a fragment measuring 0.8 cm transverse by 0.6 cm craniocaudal seen immediately posterior and lateral to the capitellum as on image 38 of series 3 and images 21-24 of series 7. A linear lucency through the base of the coronoid process of the ulna is compatible with an incomplete and nondisplaced fracture. No other fracture is  identified. The elbow is located. Joint effusion noted. Ligaments Suboptimally assessed by CT. Muscles and Tendons Appear intact by CT. Soft tissues No fluid collection or mass. IMPRESSION: Acute intra-articular fracture of the radial head and neck results in a gap at the articular surface of 0.4 cm. Small cortical fracture off the posterior aspect of the capitellum with a loose fracture fragment as described above. Lucency through the base of the coronoid process is compatible with an incomplete and nondisplaced fracture. Electronically Signed   By: Drusilla Kanner M.D.   On: 07/26/2020 13:51   CT C-SPINE NO CHARGE  Result Date: 07/26/2020 CLINICAL DATA:  Fall EXAM: CT CERVICAL SPINE WITH CONTRAST TECHNIQUE: CT of the cervical spine was reconstructed from same day CTA of the neck with contrast. CONTRAST:  None additional COMPARISON:  None. FINDINGS: Alignment: Mild degenerative retrolisthesis at C4-C5 and C5-C6. Skull base and vertebrae: No acute cervical spine fracture. Soft tissues and spinal canal: No prevertebral fluid or swelling. No visible canal hematoma. Disc levels: Multilevel degenerative changes are present including disc space narrowing, endplate osteophytes, and facet and uncovertebral hypertrophy. No high-grade osseous encroachment on the spinal canal. Multilevel foraminal narrowing. Upper chest: Included lung apices are clear. Other: Vascular findings are better evaluated on the CTA portion. IMPRESSION: No acute cervical spine fracture. Electronically Signed   By: Guadlupe Spanish M.D.   On: 07/26/2020 10:50   DG Hip Unilat  With Pelvis 2-3 Views Right  Result Date: 07/26/2020 CLINICAL DATA:  Right hip injury after fall. EXAM: DG HIP (WITH OR WITHOUT PELVIS) 2-3V RIGHT COMPARISON:  May 12, 2011. FINDINGS: There is no evidence of hip fracture or dislocation. Moderate narrowing and osteophyte formation is seen involving the right hip. IMPRESSION: Moderate osteoarthritis of the right hip. No  acute abnormality is noted. Electronically Signed   By: Lupita Raider M.D.   On: 07/26/2020 10:28   CT HEAD CODE STROKE WO CONTRAST  Result Date: 07/26/2020 CLINICAL DATA:  Code stroke.  Neuro deficit, acute stroke suspected. EXAM: CT HEAD WITHOUT CONTRAST TECHNIQUE: Contiguous axial images were obtained from the  base of the skull through the vertex without intravenous contrast. COMPARISON:  None. FINDINGS: Brain: Small right thalamocapsular hypodensity could represent a an age-indeterminate lacunar infarct. No evidence of acute large vascular territory infarct. Moderate patchy white matter hypoattenuation, most likely related to chronic microvascular ischemic disease. Small (3 mm) largely low density left cerebral convexity subdural collection. Suspected trace hyperdensity posteriorly within the collection, concerning for acute/recent hemorrhage. Approximately 2 mm of rightward midline shift. Generalized cerebral volume loss with ex vacuo ventricular dilation. No hydrocephalus. No mass lesion. Vascular: No hyperdense vessel identified. Skull: No acute fracture. Sinuses/Orbits: Right periorbital and pre maxillary soft tissue contusion. Fracture of all walls of the right maxillary sinus with layering hemosinus. Fractures of the right lateral orbital wall and orbital floor with herniation a small amount of fat through the orbital floor defect. The infraorbital foramen is involved. Bilateral staphylomas. Other: No mastoid effusions. ASPECTS [facility] Stroke Program Early CT Score) Total score (0-10 with 10 being normal): 9 IMPRESSION: 1. Small right thalamocapsular hypodensity could represent an age indeterminate lacunar infarct. MRI could further evaluate if clinically indicated. ASPECTS 9 at worst. 2. 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 Suspected trace hyperdensity posteriorly within the  collection, concerning for acute/recent hemorrhage. Approximately 2 mm of rightward midline shift. 3. Right periorbital and pre maxillary soft tissue contusion. Fracture of all walls of the right maxillary sinus with layering hemosinus. Additional fractures of the right lateral orbital wall and orbital floor with herniation a small amount of fat through the orbital floor defect and involvement of the infraorbital foramen. Dedicated maxillofacial CT could further characterize if clinically indicated. 4. 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 Findings discussed with Dr. Iver Nestle via telephone at 9:12 PM. Electronically Signed   By: Feliberto Harts MD   On: 07/26/2020 09:31   CT ANGIO HEAD NECK W WO CM W PERF (CODE STROKE)  Result Date: 07/26/2020 CLINICAL DATA:  Neuro deficit, acute stroke suspected. EXAM: CT HEAD WITHOUT CONTRAST CT ANGIOGRAPHY HEAD AND NECK CT PERFUSION BRAIN TECHNIQUE: Multidetector CT imaging of the head and neck was performed using the standard protocol during bolus administration of intravenous contrast. Multiplanar CT image reconstructions and MIPs were obtained to evaluate the vascular anatomy. Carotid stenosis measurements (when applicable) are obtained utilizing NASCET criteria, using the distal internal carotid diameter as the denominator. Multiphase CT imaging of the brain was performed following IV bolus contrast injection. Subsequent parametric perfusion maps were calculated using RAPID software. CONTRAST:  OMNIPAQUE IOHEXOL 350 MG/ML SOLN COMPARISON:  None. FINDINGS: CTA NECK FINDINGS Aortic arch: Great vessel origins are patent. Right carotid system: No evidence of dissection, stenosis (50% or greater) or occlusion. Left carotid system: Approximately 50% stenosis of the proximal ICA due to predominately calcific atherosclerosis.w Vertebral arteries: Left dominant. No significant stenosis. Mild atherosclerosis of the proximal left vertebral artery. Skeleton:  Mild to moderate multilevel degenerative change. Other neck: No acute findings. Upper chest: Visualized lung apices are clear. Review of the MIP images confirms the above findings CTA HEAD FINDINGS Anterior circulation: No emergent large vessel occlusion or evidence of proximal hemodynamically significant stenosis. Partially azygos ACA, anatomic variant. Small right A1 ACA. Mild for age calcific atherosclerosis of the cavernous and paraclinoid ICAs. Small (1-2 mm) inferiorly directed outpouching arising from the left supraclinoid ICA (series 11, image 117) z Posterior circulation: Small basilar artery with right fetal type PCA, anatomic variant. No large  vessel occlusion. Venous sinuses: As permitted by contrast timing, patent. Anatomic variants: Right fetal type PCA. Review of the MIP images confirms the above findings CT Brain Perfusion Findings: CBF (<30%) Volume: 58mL Perfusion (Tmax>6.0s) volume: 82mL in the posterior right occipital region Mismatch Volume: 54mL Infarction Location:None IMPRESSION: 1. No emergent large vessel occlusion or hemodynamically significant proximal stenosis intracranially. 2. Approximately 50% stenosis of the proximal left ICA secondary due to predominately calcific atherosclerosis. 3. RAPID calculates a small (5 mL) area of penumbra in the posterior right occipital region, which may be artifactual. Otherwise, normal perfusion. 4. 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 Urgent findings discussed with Dr. Iver Nestle at 9:40 a.m. via telephone Electronically Signed   By: Feliberto Harts MD   On: 07/26/2020 09:54   CT MAXILLOFACIAL WO CONTRAST  Result Date: 07/26/2020 CLINICAL DATA:  Right facial bone fractures EXAM: CT MAXILLOFACIAL WITHOUT CONTRAST TECHNIQUE: Multidetector CT imaging of the maxillofacial structures was performed. Multiplanar CT image reconstructions were also generated. COMPARISON:  Head CT 07/26/2020  FINDINGS: Osseous: Acute comminuted fracture of the right orbital floor with herniation of a small amount of fat through the fracture defect (series 608, image 33). Inferior rectus muscle does not extend into the fracture defect. Fracture involves the infraorbital foramen. Comminuted displaced fracture through the lateral wall of the right maxillary sinus with angulated fracture fragment (series 605, image 31). Slight bony irregularity along the medial wall of the right maxillary sinus at the level of the maxillary antrum. Blood products obstructs the right maxillary infundibulum. Minimally displaced fracture through the right lateral orbital wall (series 605, image 33; series 21, image 39). Fracture margin closely approximate the traversing lateral rectus muscle. Orbits: Posttraumatic stranding along the orbital floor adjacent to the fracture site. No intraorbital collection or hematoma. Globes are intact and symmetric. Left orbital structures are unremarkable. Sinuses: Layering blood products within the right maxillary sinus secondary to previously described fractures. Remaining paranasal sinuses are clear. Mastoid air cells are clear. Soft tissues: Right periorbital and pre maxillary soft tissue swelling. Small hematoma overlies the right zygoma measuring approximately 1.8 x 0.6 cm. Limited intracranial: See dedicated CT and MRI of the brain performed same day. IMPRESSION: 1. Acute comminuted fracture of the right orbital floor with herniation of a small amount of fat through the fracture defect. Inferior rectus muscle does not extend into the fracture defect. 2. Minimally displaced fracture through the right lateral orbital wall. Fracture margin closely approximates the traversing lateral rectus muscle. 3. Comminuted displaced fracture through the lateral wall of the right maxillary. Slight bony irregularity along the medial wall of the right maxillary sinus at the level of the maxillary antrum. 4. Right  periorbital and pre maxillary soft tissue swelling with small hematoma overlying the right zygoma. Electronically Signed   By: Duanne Guess D.O.   On: 07/26/2020 14:06   ROS: Per HPI  Blood pressure (!) 152/114, pulse 90, temperature 99 F (37.2 C), resp. rate 17, height 5\' 7"  (1.702 m), weight 85.2 kg, SpO2 100 %. Physical Exam Constitutional:      General: She is not in acute distress.    Appearance: She is well-developed. She is not diaphoretic.  HENT:     Head: Normocephalic and atraumatic.  Eyes:     General: No scleral icterus.       Right eye: No discharge.        Left eye: No discharge.     Conjunctiva/sclera:  Conjunctivae normal.  Cardiovascular:     Rate and Rhythm: Normal rate and regular rhythm.  Pulmonary:     Effort: Pulmonary effort is normal. No respiratory distress.  Musculoskeletal:     Cervical back: Normal range of motion.  Skin:    General: Skin is warm and dry.  Neurological:     Mental Status: She is alert. CNs grossly intact. MAE  Psychiatric:        Behavior: Behavior normal.   Assessment/Plan: CT and MRI imaging reviewed. There is a trivial amount of subdural blood with no need for follow up imaging. CTA head and neck with incidental left supraclinoid ICA aneurysm for which she can follow up with Dr. Conchita Paris as an outpatient. No further neurosurgical workup is needed. Call with any questions.    Council Mechanic, DNP, NP-C 07/26/2020, 3:19 PM

## 2020-07-26 NOTE — Consult Note (Addendum)
NEUROLOGY CONSULTATION NOTE   Date of service: July 26, 2020 Patient Name: Danielle Kirby MRN:  [id number] DOB:  10-12-1945 Reason for consult: "AMS with possible Stroke" Requesting Physician: Eber Hong _ _ _   _ __   _ __ _ _  __ __   _ __   __ _  History of Present Illness  Danielle Kirby is a 75 y.o. female with PMH significant for  has a past medical history of Allergy, Arthritis, Cataract, and Heart murmur. who presents via EMS after a syncopal event.   Due to patients AMS history was obtained from her significant other Marliss Coots [phone]. He reports that on Sunday 4/17 the patient had been complaining of Left hip pain and so was using crutches to walk around. He reports that she was attempting to go down a short flight of stairs (4-6) and he heard a thump. He found her down and she had fallen on her face and hip. He recommended going to the hospital but she refused. He reports that she then went to sleep. Since then he reports that she has been more tired, eating less, and drinking less since then. He reports that around 7/7:30 this morning she got up to go to the bathroom. After she used the bathroom she was walking back and she fell again so he called EMS. He reports that she has not had an incontinence to his knowledge.  LKW: Sunday 4/17 tPA given?: No, out of the window  IA performed?: No, no LVO  Premorbid modified rankin scale: 0 - 1     0 - No symptoms.     1 - No significant disability. Able to carry out all usual activities, despite some symptoms.  NIH stroke scale: 11 3 points for right leg weakness, 3 points for left leg weakness, 2 points for right leg weakness, one-point for mild aphasia, one-point for mild dysarthria, one-point for mild neglect (which may have more been inattention)   ROS  Unable to assess due to AMS  Constitutional   HEENT   Respiratory   CV   GI   GU   MSK   Skin   Neurological   Psychiatric    Past History   Past Medical  History:  Diagnosis Date  . Allergy   . Arthritis   . Cataract   . Heart murmur    Past Surgical History:  Procedure Laterality Date  . BREAST SURGERY    . COSMETIC SURGERY    . EYE SURGERY    . TUBAL LIGATION     Family History  Problem Relation Age of Onset  . Diabetes Mother   . Hypertension Father    Social History   Socioeconomic History  . Marital status: Media planner    Spouse name: Not on file  . Number of children: Not on file  . Years of education: Not on file  . Highest education level: Not on file  Occupational History  . Not on file  Tobacco Use  . Smoking status: Never Smoker  . Smokeless tobacco: Never Used  Substance and Sexual Activity  . Alcohol use: No    Alcohol/week: 0.0 standard drinks  . Drug use: No  . Sexual activity: Yes    Birth control/protection: None  Other Topics Concern  . Not on file  Social History Narrative  . Not on file   Social Determinants of Health   Financial Resource Strain: Not on file  Food Insecurity: Not  on file  Transportation Needs: Not on file  Physical Activity: Not on file  Stress: Not on file  Social Connections: Not on file   Allergies  Allergen Reactions  . Penicillins     REACTION: hives    Medications  (Not in a hospital admission)  Current Outpatient Medications  Medication Instructions  . methocarbamol (ROBAXIN) 500 mg, Oral, 2 times daily  . OVER THE COUNTER MEDICATION Daily      Vitals   Vitals:   07/26/20 0800 07/26/20 0900  BP:  140/79  Pulse:  88  Weight: 85.2 kg   Height: 5\' 7"  (1.702 m)      Body mass index is 29.42 kg/m.  Physical Exam   General: Laying in bed; occasionally complaining of pain in her wrist and back but at other times denying any pain anywhere HENT: Normal oropharynx and mucosa. Normal external appearance of ears and nose. Ecchymosis surrounding Right Eye. Neck: Supple, no pain or tenderness  CV: No JVD. No peripheral edema.  Pulmonary: Symmetric  Chest rise. Normal respiratory effort.  Abdomen: Soft to touch, non-tender.  Ext: Right upper extremity deformity concerning for acute fracture Skin: No rash. Normal palpation of skin. Ecchymosis and hematoma on Right Hip  Musculoskeletal: Normal digits and nails by inspection. No clubbing. Pain in her Right wrist.  Neurologic Examination  Mental status/Cognition: Alert, oriented to self, occasionally requiring multiple prompts for her to respond to a question and sometimes unable to follow commands.  At times she would neglect to double simultaneous stimuli on the left but at other times on the right, more consistent with delirium/inattention than true neglect Speech/language: Fluent, comprehension appears intact, object naming not intact, repetition intact.  Cranial nerves:   CN II Pupils equal and reactive to light, no obvious visual field deficits but does not reliably blink to threat anywhere   CN III,IV,VI EOM intact, no gaze preference or deviation, no nystagmus    CN V normal sensation in V1, V2, and V3 segments bilaterally    CN VII no asymmetry, no nasolabial fold flattening    CN VIII normal hearing to speech    CN IX & X normal palatal elevation, no uvular deviation    CN XI 5/5 head turn and 5/5 shoulder shrug bilaterally    CN XII midline tongue protrusion    Motor:  Confrontational testing could not be performed given her mental status and was confounded by her acute injuries on the right side, particularly the right arm.  Left arm was spontaneously antigravity and she could hold it up without drift, but the right arm would drift down quickly to the bed.  Right leg was briefly antigravity at times but the left leg did not have antigravity movement. Muscle bulk: normal, tone normal, no pronator drift or tremor present  Reflexes:  Right Left Comments  Pectoralis      Biceps (C5/6) 2+ 3+ Most likely Right diminished due to wrist fracture  Brachioradialis (C5/6) Not tested due  to fracture 3+    Triceps (C6/7)      Patellar (L3/4) 3+ 0    Achilles (S1) 2+ 2+    Hoffman      Plantar Down going Down going   Jaw jerk    Sensation:  Light touch  appears to react equally to light touch in all 4 extremities   Pin prick    Temperature    Vibration   Proprioception    Coordination/Complex Motor:  - Finger to Nose  intact on Left unable to assess on Right due to wrist fracture - Heel to shin unable to assess due to inability to follow commands and lower extremity weakness - Rapid alternating movement unable to assess - Gait: unable to assess  Labs   CBC:  Recent Labs  Lab 07/26/20 0852 07/26/20 0859  WBC 10.9*  --   NEUTROABS 8.4*  --   HGB 12.0 12.6  HCT 40.8 37.0  MCV 91.3  --   PLT 191  --   Notably her baseline white blood cell count is 2-3  Basic Metabolic Panel:  Lab Results  Component Value Date   NA 140 07/26/2020   K 3.4 (L) 07/26/2020   CO2 21 03/01/2019   GLUCOSE 129 (H) 07/26/2020   BUN 21 07/26/2020   CREATININE 0.90 07/26/2020   CALCIUM 9.9 03/01/2019   GFRNONAA 77 03/01/2019   GFRAA 89 03/01/2019   Lipid Panel:  Lab Results  Component Value Date   LDLCALC 140 (H) 03/01/2019   HgbA1c:  Lab Results  Component Value Date   HGBA1C 5.5 03/01/2019   Urine Drug Screen: No results found for: LABOPIA, COCAINSCRNUR, LABBENZ, AMPHETMU, THCU, LABBARB  Alcohol Level No results found for: ETH  CT Head without contrast: Small right thalamocapsular hypodensity could represent an age indeterminate lacunar infarct. MRI could further evaluate if clinically indicated. ASPECTS 9 at worst. 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 Suspected trace hyperdensity posteriorly within the collection, concerning for acute/recent hemorrhage. Approximately 2 mm of rightward midline shift. Right periorbital and pre maxillary soft tissue  contusion. Fracture of all walls of the right maxillary sinus with layering hemosinus. Additional fractures of the right lateral orbital wall and orbital floor with herniation a small amount of fat through the orbital floor defect and involvement of the infraorbital foramen. Dedicated maxillofacial CT could further characterize if clinically indicated. 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   CT angio Head and Neck W WO CM W Perf: No emergent large vessel occlusion or hemodynamically significant proximal stenosis intracranially. Approximately 50% stenosis of the proximal left ICA secondary due to predominately calcific atherosclerosis. RAPID calculates a small (5 mL) area of penumbra in the posterior right occipital region, which may be artifactual. Otherwise, normal perfusion. 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   MRI Brain: ordered    Impression   NYJA WESTBROOK is a 75 y.o. female with PMH not significant for anything. Her neurologic examination is notable for AMS and reflex abnormalities. She has a shifting strength exam and is confused. As she does have trauma from her fall and decrease oral intake the cause of her AMS may not be from an acute stroke. Will order MR Brain and if this is negative no further stroke work up is required. CT Head showed Right orbit fracture consult has been called by ED physician for further work up. She does have Right Wrist fracture and Hand surgery has been consulted. Given her Subdural hematoma Neurosurgery has been consulted  Recommendations  -MRI Brain W WO Contrast ordered -MR Lumbar Spine ordered -UA and UDS pending -Opthalmology Consulted -Hand Surgery Consulted -Neurosurgery Consulted -Will await MRI Brain result before further stroke work up, recommend stroke work-up only if positive for stroke -No need for permissive hypertension given that there is no large vessel  occlusion ______________________________________________________________________   Arna Snipe MD  Resident   Brooke DareSrishti Sender Rueb MD-PhD [facility] [phone] Available 7 AM to 7 PM, outside these hours please contact Neurologist on call listed on AMION   Attending Neurologist's note:  I personally saw this patient, gathering history, performing a full neurologic examination, reviewing relevant labs, personally reviewing relevant imaging including Head CT, CTA, CTP, and formulated the assessment and plan, adding the note above for completeness and clarity to accurately reflect my thoughts   Addendum: MRI brain personally reviewed, small hemorrhages may be related to her recent fall for which supportive care is indicated, agree with neurosurgery that no short interval follow-up scans are necessary.  Appreciate gynecological consultation and evaluation for endometrial lesion on lumbar spine MRI.  Please reconsult if new questions arise. Neurology will be available on an as-needed basis going forward.

## 2020-07-26 NOTE — Consult Note (Addendum)
Reason for Consult:Right wrist fx Referring Physician: Fleet ContrasB Miller Time called: 1046 Time at bedside: 1220   Danielle GitelmanBrenda P Kirby is an [age] y.o. female.  HPI: Danielle DroneBrenda has fallen twice in the past week. The first was Sunday where she fell down up to 6 steps. The second was today. She hurt her wrist in the first fall but did not seek care. She thinks that some sort of weakness preceded the falls but is vague on specifics. She thinks the weakness has been a recent phenomenon, maybe preceding the first fall by a few days. She is RHD and normally does not use any assistive devices to ambulate but has been using a crutch in the past 7-10d. She lives at home with her son I believe.  Past Medical History:  Diagnosis Date   Allergy    Arthritis    Cataract    Heart murmur     Past Surgical History:  Procedure Laterality Date   BREAST SURGERY     COSMETIC SURGERY     EYE SURGERY     TUBAL LIGATION      Family History  Problem Relation Age of Onset   Diabetes Mother    Hypertension Father     Social History:  reports that she has never smoked. She has never used smokeless tobacco. She reports that she does not drink alcohol and does not use drugs.  Allergies:  Allergies  Allergen Reactions   Penicillins     REACTION: hives    Medications: I have reviewed the patient's current medications.  Results for orders placed or performed during the hospital encounter of 07/26/20 (from the past 48 hour(s))  Resp Panel by RT-PCR (Flu A&B, Covid) Nasopharyngeal Swab     Status: None   Collection Time: 07/26/20  8:52 AM   Specimen: Nasopharyngeal Swab; Nasopharyngeal(NP) swabs in vial transport medium  Result Value Ref Range   SARS Coronavirus 2 by RT PCR NEGATIVE NEGATIVE    Comment: (NOTE) SARS-CoV-2 target nucleic acids are NOT DETECTED.  The SARS-CoV-2 RNA is generally detectable in upper respiratory specimens during the acute phase of infection. The lowest concentration of SARS-CoV-2 viral  copies this assay can detect is 138 copies/mL. A negative result does not preclude SARS-Cov-2 infection and should not be used as the sole basis for treatment or other patient management decisions. A negative result may occur with  improper specimen collection/handling, submission of specimen other than nasopharyngeal swab, presence of viral mutation(s) within the areas targeted by this assay, and inadequate number of viral copies(<138 copies/mL). A negative result must be combined with clinical observations, patient history, and epidemiological information. The expected result is Negative.  Fact Sheet for Patients:  BloggerCourse.comhttps://www.fda.gov/media/152166/download  Fact Sheet for Healthcare Providers:  SeriousBroker.ithttps://www.fda.gov/media/152162/download  This test is no t yet approved or cleared by the [place] FDA and  has been authorized for detection and/or diagnosis of SARS-CoV-2 by FDA under an Emergency Use Authorization (EUA). This EUA will remain  in effect (meaning this test can be used) for the duration of the COVID-19 declaration under Section 564(b)(1) of the Act, 21 U.S.C.section 360bbb-3(b)(1), unless the authorization is terminated  or revoked sooner.       Influenza A by PCR NEGATIVE NEGATIVE   Influenza B by PCR NEGATIVE NEGATIVE    Comment: (NOTE) The Xpert Xpress SARS-CoV-2/FLU/RSV plus assay is intended as an aid in the diagnosis of influenza from Nasopharyngeal swab specimens and should not be used as a sole basis for treatment.  Nasal washings and aspirates are unacceptable for Xpert Xpress SARS-CoV-2/FLU/RSV testing.  Fact Sheet for Patients: BloggerCourse.com  Fact Sheet for Healthcare Providers: SeriousBroker.it  This test is not yet approved or cleared by the [place] FDA and has been authorized for detection and/or diagnosis of SARS-CoV-2 by FDA under an Emergency Use Authorization (EUA). This EUA will  remain in effect (meaning this test can be used) for the duration of the COVID-19 declaration under Section 564(b)(1) of the Act, 21 U.S.C. section 360bbb-3(b)(1), unless the authorization is terminated or revoked.  Performed at [facility], [address] [place], [place] [address]   Ethanol     Status: None   Collection Time: 07/26/20  8:52 AM  Result Value Ref Range   Alcohol, Ethyl (B) <10 <10 mg/dL    Comment: (NOTE) Lowest detectable limit for serum alcohol is 10 mg/dL.  For medical purposes only. Performed at [facility], [address] [place], [place] [address]   Protime-INR     Status: None   Collection Time: 07/26/20  8:52 AM  Result Value Ref Range   Prothrombin Time 14.2 11.4 - 15.2 seconds   INR 1.1 0.8 - 1.2    Comment: (NOTE) INR goal varies based on device and disease states. Performed at [facility], [address], [place], [place] [address]   APTT     Status: Abnormal   Collection Time: 07/26/20  8:52 AM  Result Value Ref Range   aPTT 21 (L) 24 - 36 seconds    Comment: Performed at [facility], [address] [place], [place] [address]  CBC     Status: Abnormal   Collection Time: 07/26/20  8:52 AM  Result Value Ref Range   WBC 10.9 (H) 4.0 - 10.5 K/uL   RBC 4.47 3.87 - 5.11 MIL/uL   Hemoglobin 12.0 12.0 - 15.0 g/dL   HCT 44.9 67.5 - 91.6 %   MCV 91.3 80.0 - 100.0 fL   MCH 26.8 26.0 - 34.0 pg   MCHC 29.4 (L) 30.0 - 36.0 g/dL   RDW 38.4 66.5 - 99.3 %   Platelets 191 150 - 400 K/uL   nRBC 0.0 0.0 - 0.2 %    Comment: Performed at [facility], [address], [place], [place] [address]  Differential     Status: Abnormal   Collection Time: 07/26/20  8:52 AM  Result Value Ref Range   Neutrophils Relative % 76 %   Neutro Abs 8.4 (H) 1.7 - 7.7 K/uL   Lymphocytes Relative 16 %   Lymphs Abs 1.7 0.7 - 4.0 K/uL   Monocytes Relative 7 %   Monocytes Absolute 0.8 0.1 - 1.0 K/uL   Eosinophils Relative 0 %    Eosinophils Absolute 0.0 0.0 - 0.5 K/uL   Basophils Relative 0 %   Basophils Absolute 0.0 0.0 - 0.1 K/uL   Immature Granulocytes 1 %   Abs Immature Granulocytes 0.07 0.00 - 0.07 K/uL    Comment: Performed at [facility], [address], [place], [place] [address]  Comprehensive metabolic panel     Status: Abnormal   Collection Time: 07/26/20  8:52 AM  Result Value Ref Range   Sodium 139 135 - 145 mmol/L   Potassium 3.4 (L) 3.5 - 5.1 mmol/L   Chloride 103 98 - 111 mmol/L   CO2 20 (L) 22 - 32 mmol/L   Glucose, Bld 127 (H) 70 - 99  mg/dL    Comment: Glucose reference range applies only to samples taken after fasting for at least 8 hours.   BUN 17 8 - 23 mg/dL   Creatinine, Ser 4.09 (H) 0.44 - 1.00 mg/dL   Calcium 81.1 8.9 - 91.4 mg/dL   Total Protein 7.5 6.5 - 8.1 g/dL   Albumin 3.6 3.5 - 5.0 g/dL   AST 26 15 - 41 U/L   ALT 18 0 - 44 U/L   Alkaline Phosphatase 70 38 - 126 U/L   Total Bilirubin 1.1 0.3 - 1.2 mg/dL   GFR, Estimated 54 (L) >60 mL/min    Comment: (NOTE) Calculated using the CKD-EPI Creatinine Equation (2021)    Anion gap 16 (H) 5 - 15    Comment: Performed at [facility], [address] [place], [place] [address]  CBG monitoring, ED     Status: Abnormal   Collection Time: 07/26/20  8:53 AM  Result Value Ref Range   Glucose-Capillary 133 (H) 70 - 99 mg/dL    Comment: Glucose reference range applies only to samples taken after fasting for at least 8 hours.  I-stat chem 8, ED     Status: Abnormal   Collection Time: 07/26/20  8:59 AM  Result Value Ref Range   Sodium 140 135 - 145 mmol/L   Potassium 3.4 (L) 3.5 - 5.1 mmol/L   Chloride 107 98 - 111 mmol/L   BUN 21 8 - 23 mg/dL   Creatinine, Ser 6.21 0.44 - 1.00 mg/dL   Glucose, Bld 308 (H) 70 - 99 mg/dL    Comment: Glucose reference range applies only to samples taken after fasting for at least 8 hours.   Calcium, Ion 1.12 (L) 1.15 - 1.40 mmol/L   TCO2 25 22 - 32 mmol/L   Hemoglobin 12.6 12.0 - 15.0  g/dL   HCT 65.7 84.6 - 96.2 %    DG Forearm Right  Result Date: 07/26/2020 CLINICAL DATA:  Right wrist pain after fall. EXAM: RIGHT FOREARM - 2 VIEW COMPARISON:  September 08, 2017. FINDINGS: Moderately displaced fracture is seen involving the distal radius laterally with intra-articular extension. Mildly displaced ulnar styloid fracture is noted as well. IMPRESSION: Moderately displaced distal right radial fracture with intra-articular extension. Mildly displaced ulnar styloid fracture. Electronically Signed   By: Lupita Raider M.D.   On: 07/26/2020 10:26   MR BRAIN W WO CONTRAST  Result Date: 07/26/2020 CLINICAL DATA:  Neuro deficit, acute stroke suspected. EXAM: MRI HEAD WITHOUT AND WITH CONTRAST TECHNIQUE: Multiplanar, multiecho pulse sequences of the brain and surrounding structures were obtained without and with intravenous contrast. CONTRAST:  7.14mL GADAVIST GADOBUTROL 1 MMOL/ML IV SOLN COMPARISON:  Same day CT code stroke imaging. FINDINGS: Brain: No acute infarct. Small (4 mm) CSF intensity left cerebral convexity fluid collection. Approximately 3 mm of rightward midline shift at the foramina [place]. Basal cisterns are patent. Moderate cerebral atrophy with ex vacuo ventricular dilation. No hydrocephalus. No mass lesion. No abnormal enhancement on the motion limited postcontrast imaging. Numerous small foci of susceptibility artifact within the posterior right temporal and right parieto-occipital region without associated edema or hyperdensity on same day CT head. Vascular: Major arterial flow voids are maintained at the skull base. Better evaluated on same day CTA. Skull and upper cervical spine: Normal marrow signal. Sinuses/Orbits: Right cheek/periorbital contusion with facial fractures better characterized on same day CT head. Mucosal thickening and hemorrhage/fluid within the right maxillary sinus. Bilateral staphylomas. Other: No mastoid effusions. IMPRESSION:  1. No acute infarct. 2. 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 Possible trace superimposed recent hemorrhage was better evaluated on the same day CT head. Approximately 3 mm of resulting rightward midline shift. 3. Right cheek/periorbital contusion with facial fractures better characterized on same day CT head. 4. Moderate chronic microvascular ischemic disease. 5. Numerous small foci of susceptibility artifact within the posterior right temporal and right parieto-occipital region without associated edema or hyperdensity on same day CT head, compatible with prior microhemorrhages. The focality of this finding is atypical for hypertensive hemorrhages, amyloid angiopathy or prior emboli. Prior trauma is a differential consideration. Electronically Signed   By: Feliberto Harts MD   On: 07/26/2020 12:24   CT C-SPINE NO CHARGE  Result Date: 07/26/2020 CLINICAL DATA:  Fall EXAM: CT CERVICAL SPINE WITH CONTRAST TECHNIQUE: CT of the cervical spine was reconstructed from same day CTA of the neck with contrast. CONTRAST:  None additional COMPARISON:  None. FINDINGS: Alignment: Mild degenerative retrolisthesis at C4-C5 and C5-C6. Skull base and vertebrae: No acute cervical spine fracture. Soft tissues and spinal canal: No prevertebral fluid or swelling. No visible canal hematoma. Disc levels: Multilevel degenerative changes are present including disc space narrowing, endplate osteophytes, and facet and uncovertebral hypertrophy. No high-grade osseous encroachment on the spinal canal. Multilevel foraminal narrowing. Upper chest: Included lung apices are clear. Other: Vascular findings are better evaluated on the CTA portion. IMPRESSION: No acute cervical spine fracture. Electronically Signed   By: Guadlupe Spanish M.D.   On: 07/26/2020 10:50   DG Hip Unilat  With Pelvis 2-3 Views Right  Result Date: 07/26/2020 CLINICAL DATA:  Right hip injury after fall. EXAM: DG HIP  (WITH OR WITHOUT PELVIS) 2-3V RIGHT COMPARISON:  May 12, 2011. FINDINGS: There is no evidence of hip fracture or dislocation. Moderate narrowing and osteophyte formation is seen involving the right hip. IMPRESSION: Moderate osteoarthritis of the right hip. No acute abnormality is noted. Electronically Signed   By: Lupita Raider M.D.   On: 07/26/2020 10:28   CT HEAD CODE STROKE WO CONTRAST  Result Date: 07/26/2020 CLINICAL DATA:  Code stroke.  Neuro deficit, acute stroke suspected. EXAM: CT HEAD WITHOUT CONTRAST TECHNIQUE: Contiguous axial images were obtained from the base of the skull through the vertex without intravenous contrast. COMPARISON:  None. FINDINGS: Brain: Small right thalamocapsular hypodensity could represent a an age-indeterminate lacunar infarct. No evidence of acute large vascular territory infarct. Moderate patchy white matter hypoattenuation, most likely related to chronic microvascular ischemic disease. Small (3 mm) largely low density left cerebral convexity subdural collection. Suspected trace hyperdensity posteriorly within the collection, concerning for acute/recent hemorrhage. Approximately 2 mm of rightward midline shift. Generalized cerebral volume loss with ex vacuo ventricular dilation. No hydrocephalus. No mass lesion. Vascular: No hyperdense vessel identified. Skull: No acute fracture. Sinuses/Orbits: Right periorbital and pre maxillary soft tissue contusion. Fracture of all walls of the right maxillary sinus with layering hemosinus. Fractures of the right lateral orbital wall and orbital floor with herniation a small amount of fat through the orbital floor defect. The infraorbital foramen is involved. Bilateral staphylomas. Other: No mastoid effusions. ASPECTS [facility] Stroke Program Early CT Score) Total score (0-10 with 10 being normal): 9 IMPRESSION: 1. Small right thalamocapsular hypodensity could represent an age indeterminate lacunar infarct. MRI could further  evaluate if clinically indicated. ASPECTS 9 at worst. 2. Small (3 mm) largely low density left cerebral convexity subdural collection,  suspicious for subdural hematoma or hygroma that is likely in part chronic. Suspected trace hyperdensity posteriorly within the collection, concerning for acute/recent hemorrhage. Approximately 2 mm of rightward midline shift. 3. Right periorbital and pre maxillary soft tissue contusion. Fracture of all walls of the right maxillary sinus with layering hemosinus. Additional fractures of the right lateral orbital wall and orbital floor with herniation a small amount of fat through the orbital floor defect and involvement of the infraorbital foramen. Dedicated maxillofacial CT could further characterize if clinically indicated. 4. 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 Findings discussed with Dr. Iver Nestle via telephone at 9:12 PM. Electronically Signed   By: Feliberto Harts MD   On: 07/26/2020 09:31   CT ANGIO HEAD NECK W WO CM W PERF (CODE STROKE)  Result Date: 07/26/2020 CLINICAL DATA:  Neuro deficit, acute stroke suspected. EXAM: CT HEAD WITHOUT CONTRAST CT ANGIOGRAPHY HEAD AND NECK CT PERFUSION BRAIN TECHNIQUE: Multidetector CT imaging of the head and neck was performed using the standard protocol during bolus administration of intravenous contrast. Multiplanar CT image reconstructions and MIPs were obtained to evaluate the vascular anatomy. Carotid stenosis measurements (when applicable) are obtained utilizing NASCET criteria, using the distal internal carotid diameter as the denominator. Multiphase CT imaging of the brain was performed following IV bolus contrast injection. Subsequent parametric perfusion maps were calculated using RAPID software. CONTRAST:  OMNIPAQUE IOHEXOL 350 MG/ML SOLN COMPARISON:  None. FINDINGS: CTA NECK FINDINGS Aortic arch: Great vessel origins are patent. Right carotid system: No evidence of dissection, stenosis (50% or  greater) or occlusion. Left carotid system: Approximately 50% stenosis of the proximal ICA due to predominately calcific atherosclerosis.w Vertebral arteries: Left dominant. No significant stenosis. Mild atherosclerosis of the proximal left vertebral artery. Skeleton: Mild to moderate multilevel degenerative change. Other neck: No acute findings. Upper chest: Visualized lung apices are clear. Review of the MIP images confirms the above findings CTA HEAD FINDINGS Anterior circulation: No emergent large vessel occlusion or evidence of proximal hemodynamically significant stenosis. Partially azygos ACA, anatomic variant. Small right A1 ACA. Mild for age calcific atherosclerosis of the cavernous and paraclinoid ICAs. Small (1-2 mm) inferiorly directed outpouching arising from the left supraclinoid ICA (series 11, image 117) z Posterior circulation: Small basilar artery with right fetal type PCA, anatomic variant. No large vessel occlusion. Venous sinuses: As permitted by contrast timing, patent. Anatomic variants: Right fetal type PCA. Review of the MIP images confirms the above findings CT Brain Perfusion Findings: CBF (<30%) Volume: 16mL Perfusion (Tmax>6.0s) volume: 72mL in the posterior right occipital region Mismatch Volume: 27mL Infarction Location:None IMPRESSION: 1. No emergent large vessel occlusion or hemodynamically significant proximal stenosis intracranially. 2. Approximately 50% stenosis of the proximal left ICA secondary due to predominately calcific atherosclerosis. 3. RAPID calculates a small (5 mL) area of penumbra in the posterior right occipital region, which may be artifactual. Otherwise, normal perfusion. 4. 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 Urgent findings discussed with Dr. Iver Nestle at 9:40 a.m. via telephone Electronically Signed   By: Feliberto Harts MD   On: 07/26/2020 09:54    Review of Systems  HENT: Negative for ear  discharge, ear pain, hearing loss and tinnitus.   Eyes: Negative for photophobia and pain.  Respiratory: Negative for cough and shortness of breath.   Cardiovascular: Negative for chest pain.  Gastrointestinal: Negative for abdominal pain, nausea and vomiting.  Genitourinary: Negative for dysuria, flank pain, frequency and urgency.  Musculoskeletal: Positive  for arthralgias (Right wrist, mild). Negative for back pain, myalgias and neck pain.  Neurological: Positive for weakness. Negative for dizziness and headaches.  Hematological: Does not bruise/bleed easily.  Psychiatric/Behavioral: The patient is not nervous/anxious.    Blood pressure (!) 175/109, pulse 80, temperature 99 F (37.2 C), resp. rate 11, height 5\' 7"  (1.702 m), weight 85.2 kg, SpO2 99 %. Physical Exam Constitutional:      General: She is not in acute distress.    Appearance: She is well-developed. She is not diaphoretic.  HENT:     Head: Normocephalic and atraumatic.  Eyes:     General: No scleral icterus.       Right eye: No discharge.        Left eye: No discharge.     Conjunctiva/sclera: Conjunctivae normal.  Cardiovascular:     Rate and Rhythm: Normal rate and regular rhythm.  Pulmonary:     Effort: Pulmonary effort is normal. No respiratory distress.  Musculoskeletal:     Cervical back: Normal range of motion.     Comments: Right shoulder, elbow, wrist, digits- no skin wounds, mild TTP wrist, no instability, no blocks to motion  Sens  Ax/R/M/U intact  Mot   Ax intact, R/AIN 4/5,/ M 3/5, PIN/U absent to 1/5  Rad 2+  Skin:    General: Skin is warm and dry.  Neurological:     Mental Status: She is alert.  Psychiatric:        Behavior: Behavior normal.     Assessment/Plan: Right wrist/? Elbow fx -- Will get CT of both and place in sugar tong. Can see Dr. after discharge. NWB.    Merlyn Lot, PA-C Orthopedic Surgery [phone] 07/26/2020, 12:30 PM   Addendum (07/26/20): Patient seen  and examined.  Agree with above. H: Seen in hospital room.  Grandson in room with her.  Conversant and cooperative.  Reportedly has had two falls, injuring right arm.  Exam: intact sensation and capillary refill all digits.  Moving all fingers without pain.  +epl/fpl/io.  Splint well fitting.  A/P: XR/CT show comminuted distal radius fracture and minimally displaced radial head fracture.  Recommend sugartong splint and follow up in office next week.  Briefly discussed possible ORIF of distal radius and possibly non operative treatment of radial head.  Will review radiographs and discuss her options further in office at follow up.  They are comfortable with the plan.  She states the splint is well fitting and comfortable.

## 2020-07-27 LAB — CBC
HCT: 36.9 % (ref 36.0–46.0)
Hemoglobin: 11.5 g/dL — ABNORMAL LOW (ref 12.0–15.0)
MCH: 27.3 pg (ref 26.0–34.0)
MCHC: 31.2 g/dL (ref 30.0–36.0)
MCV: 87.6 fL (ref 80.0–100.0)
Platelets: 167 10*3/uL (ref 150–400)
RBC: 4.21 MIL/uL (ref 3.87–5.11)
RDW: 14.4 % (ref 11.5–15.5)
WBC: 7.8 10*3/uL (ref 4.0–10.5)
nRBC: 0 % (ref 0.0–0.2)

## 2020-07-27 LAB — BASIC METABOLIC PANEL
Anion gap: 11 (ref 5–15)
BUN: 17 mg/dL (ref 8–23)
CO2: 23 mmol/L (ref 22–32)
Calcium: 9.7 mg/dL (ref 8.9–10.3)
Chloride: 107 mmol/L (ref 98–111)
Creatinine, Ser: 0.74 mg/dL (ref 0.44–1.00)
GFR, Estimated: >60 mL/min (ref >60)
Glucose, Bld: 92 mg/dL (ref 70–99)
Potassium: 3.3 mmol/L — ABNORMAL LOW (ref 3.5–5.1)
Sodium: 141 mmol/L (ref 135–145)

## 2020-07-27 LAB — GLUCOSE, CAPILLARY: Glucose-Capillary: 94 mg/dL (ref 70–99)

## 2020-07-27 MED ORDER — ACETAMINOPHEN 500 MG PO TABS
1000.0000 mg | ORAL_TABLET | ORAL | Status: DC | PRN
  Administered 2020-07-27 – 2020-07-31 (×2): 1000 mg via ORAL
  Filled 2020-07-27 (×2): qty 2

## 2020-07-27 MED ORDER — HALOPERIDOL LACTATE 5 MG/ML IJ SOLN
2.0000 mg | INTRAMUSCULAR | Status: DC | PRN
  Administered 2020-07-28 (×3): 2 mg via INTRAVENOUS
  Filled 2020-07-27 (×3): qty 1

## 2020-07-27 MED ORDER — SODIUM CHLORIDE 0.9% FLUSH: 10.0000 mL | INTRAVENOUS | Status: DC | PRN

## 2020-07-27 MED ORDER — POTASSIUM CHLORIDE 20 MEQ PO PACK
40.0000 meq | PACK | ORAL | Status: AC
  Administered 2020-07-27 (×2): 40 meq via ORAL
  Filled 2020-07-27 (×2): qty 2

## 2020-07-27 MED ORDER — METHOCARBAMOL 750 MG PO TABS
750.0000 mg | ORAL_TABLET | Freq: Three times a day (TID) | ORAL | Status: DC
  Administered 2020-07-27 (×2): 750 mg via ORAL
  Filled 2020-07-27: qty 2
  Filled 2020-07-27 (×3): qty 1

## 2020-07-27 MED ORDER — SODIUM CHLORIDE 0.9% FLUSH
10.0000 mL | Freq: Two times a day (BID) | INTRAVENOUS | Status: DC
  Administered 2020-07-27 – 2020-08-02 (×13): 10 mL

## 2020-07-27 MED ORDER — OXYCODONE HCL 5 MG PO TABS: 2.5000 mg | ORAL_TABLET | ORAL | Status: DC | PRN

## 2020-07-27 NOTE — Progress Notes (Signed)
Pt arrived to the unit. VS stable, A&O X1. X2 visitors at bedside. Pt currently in chair. Belongings at bedside include:  Clothes Glasses Partial plate in pink denture cup Diamond ring (Pt wearing)

## 2020-07-27 NOTE — Evaluation (Signed)
Occupational Therapy Evaluation Patient Details Name: Danielle Kirby MRN: [id number] DOB: 06-03-1945 Today's Date: 07/27/2020    History of Present Illness This 75 y.o. female admitted after multiple falls.  First fall was several days PTA where she fell down 4-6 stairs.  Then fell fell in the bathroom and was found by significant other unresponsive.  CT of brain small SDH or hygroma with suspected hemorrhage with ~48mm Right midline shift; Rt periorbital and pre maxillary soft tissue contusion.  Fx of all walls of the Rt maxillary sinus with layering hemosinus.  Additional fractures of the Rt lateral orbital wall and orbital floor with herniation of small amount of fat throgh orbital floor.  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  She was also found to have Rt wrist/elbow fx (NWB Rt UE).  PMH includes: cataracts, arthritis   Clinical Impression   Pt admitted with above. She demonstrates the below listed deficits and will benefit from continued OT to maximize safety and independence with BADLs.  Pt presents to OT with generalized weakness, impaired balance, decreased activity tolerance, significant cognitive deficits and decreased Rt UE function.  She currently requires mod - total A for ADLs and mod A +2 for functional transfers.  She lives at home with her significant other and, per his report, was fully independent with ADLs and IADLs including driving, financial and medication management.  SHe currently presents with behaviors consistent with Ranchos Level V (confused, inappropriate), with some emerging level VI behaviors. .  Recommend CIR level rehab.       Follow Up Recommendations  CIR    Equipment Recommendations  None recommended by OT    Recommendations for Other Services Rehab consult     Precautions / Restrictions Precautions Precautions: Fall Restrictions Weight Bearing Restrictions: Yes RUE Weight Bearing: Non weight bearing      Mobility Bed  Mobility Overal bed mobility: Needs Assistance Bed Mobility: Supine to Sit     Supine to sit: Mod assist     General bed mobility comments: assist to initiate movement and assist to lift trunk    Transfers Overall transfer level: Needs assistance Equipment used: 1 person hand held assist;2 person hand held assist Transfers: Sit to/from [employer] Transfers Sit to Stand: Mod assist;+2 physical assistance;+2 safety/equipment Stand pivot transfers: Mod assist;+2 physical assistance;+2 safety/equipment       General transfer comment: assist to power up into standing.  She demonstrates heavy Rt lateral lean and requires assist to weight shift bil. to advance feet and assist to advance Lt foot    Balance Overall balance assessment: Needs assistance Sitting-balance support: Feet supported Sitting balance-Leahy Scale: Fair     Standing balance support: Single extremity supported;During functional activity Standing balance-Leahy Scale: Poor Standing balance comment: required assist +2 for static standing                           ADL either performed or assessed with clinical judgement   ADL Overall ADL's : Needs assistance/impaired Eating/Feeding: Minimal assistance;Sitting   Grooming: Wash/dry hands;Wash/dry face;Oral care;Moderate assistance;Sitting   Upper Body Bathing: Moderate assistance;Sitting   Lower Body Bathing: Sit to/from stand;Maximal assistance   Upper Body Dressing : Maximal assistance;Sitting   Lower Body Dressing: Maximal assistance;Sit to/from stand   Toilet Transfer: Moderate assistance;+2 for physical assistance;+2 for safety/equipment;Stand-pivot;BSC   Toileting- Clothing Manipulation and Hygiene: Total assistance;Sit to/from stand       Functional mobility during ADLs: Maximal assistance;+2 for physical  assistance;+2 for safety/equipment       Vision Baseline Vision/History: Wears glasses Wears Glasses: At all times Patient  Visual Report: No change from baseline Additional Comments: Pt demonstrated difficulty following multi step command for testing.  She was able to locate time on the monitor and read it correctly, but was noted to close Rt eye, but adamantly denies diplopia.     Perception Perception Perception Tested?: Yes   Praxis Praxis Praxis tested?: Within functional limits    Pertinent Vitals/Pain Pain Assessment: No/denies pain     Hand Dominance     Extremity/Trunk Assessment Upper Extremity Assessment Upper Extremity Assessment: RUE deficits/detail RUE Deficits / Details: Pt has removed the sugartong splint and sling.  Sling reapplied with her tolerating it fairly well.  Rt UE not formally assessed   Lower Extremity Assessment Lower Extremity Assessment: Defer to PT evaluation       Communication Communication Communication: No difficulties   Cognition Arousal/Alertness: Awake/alert Behavior During Therapy: Flat affect Overall Cognitive Status: Impaired/Different from baseline Area of Impairment: Orientation;Attention;Memory;Following commands;Safety/judgement;Awareness;Rancho level               Rancho Levels of Cognitive Functioning Rancho Mirant Scales of Cognitive Functioning: Confused/inappropriate/non-agitated Orientation Level: Disoriented to;Time;Situation Current Attention Level: Sustained Memory: Decreased short-term memory Following Commands: Follows one step commands consistently;Follows multi-step commands inconsistently Safety/Judgement: Decreased awareness of deficits;Decreased awareness of safety Awareness:  (Pt has no awareness of injuries of reason for admission initially)   General Comments: Pt intially only oriented to self and [facility].  She follows one step commands and has no awareness of injuries of deficits.  At end of session, she was able to recall that it is April, "2021", and that she is in hosptal secondary to fall although she  still denies injuries   General Comments  Pt's signficant other present during session.    Exercises     Shoulder Instructions      Home Living Family/patient expects to be discharged to:: Private residence Living Arrangements: Spouse/significant other Available Help at Discharge: Family;Available 24 hours/day Type of Home: House Home Access: Stairs to enter [employer] of Steps: 6   Home Layout: Two level;Laundry or work area in [employer] of Steps: split foyer with 6-7 steps up to the main living area, and 6-7 steps to the basement   Bathroom Shower/Tub: Walk-in shower;Tub only   Bathroom Toilet: Standard     Home Equipment: None   Additional Comments: Pt lives with her significant other      Prior Functioning/Environment          Comments: Per pt's significant other, Pt was fully independent.  Drives. cleans, managed own finances and medications.  She worked for the [employer] for 30 years        OT Problem List: Decreased strength;Decreased activity tolerance;Impaired vision/perception;Impaired balance (sitting and/or standing);Decreased coordination;Decreased cognition;Decreased safety awareness;Decreased knowledge of use of DME or AE;Decreased knowledge of precautions;Impaired UE functional use;Pain      OT Treatment/Interventions: Self-care/ADL training;Neuromuscular education;DME and/or AE instruction;Therapeutic activities;Cognitive remediation/compensation;Visual/perceptual remediation/compensation;Patient/family education;Balance training    OT Goals(Current goals can be found in the care plan section) Acute Rehab OT Goals Patient Stated Goal: Pt did not state OT Goal Formulation: With patient/family Time For Goal Achievement: 08/10/20 Potential to Achieve Goals: Good ADL Goals Pt Will Perform Upper Body Bathing: (P) with min assist;sitting Pt Will Perform Lower Body Bathing: (P) with mod assist;sit to/from  stand Pt Will Perform Upper Body  Dressing: (P) with mod assist;sitting Pt Will Perform Lower Body Dressing: (P) with mod assist;sit to/from stand Pt Will Transfer to Toilet: (P) with min assist;stand pivot transfer;bedside commode Pt Will Perform Toileting - Clothing Manipulation and hygiene: (P) with mod assist;sit to/from stand Additional ADL Goal #1: (P) Pt will be oriented x 4 with min cues and external cues Additional ADL Goal #2: (P) Pt will sustain attention to familiar ADL x 8 mins with min cues  OT Frequency: Min 2X/week   Barriers to D/C:            Co-evaluation PT/OT/SLP Co-Evaluation/Treatment: Yes Reason for Co-Treatment: Necessary to address cognition/behavior during functional activity;For patient/therapist safety;To address functional/ADL transfers   OT goals addressed during session: ADL's and self-care      AM-PAC OT "6 Clicks" Daily Activity     Outcome Measure Help from another person eating meals?: A Little Help from another person taking care of personal grooming?: A Lot Help from another person toileting, which includes using toliet, bedpan, or urinal?: A Lot Help from another person bathing (including washing, rinsing, drying)?: A Lot Help from another person to put on and taking off regular upper body clothing?: A Lot Help from another person to put on and taking off regular lower body clothing?: Total 6 Click Score: 12   End of Session Equipment Utilized During Treatment: Gait belt Nurse Communication: Mobility status  Activity Tolerance: Patient tolerated treatment well Patient left: in bed;with call bell/phone within reach;with chair alarm set;with family/visitor present  OT Visit Diagnosis: Unsteadiness on feet (R26.81);Cognitive communication deficit (R41.841)                Time: 0240-9735 OT Time Calculation (min): 36 min Charges:  OT General Charges $OT Visit: 1 Visit OT Evaluation $OT Eval Moderate Complexity: 1 Mod  Eber Jones.,  OTR/L Acute Rehabilitation Services Pager [phone] Office [phone]   Jeani Hawking M 07/27/2020, 2:35 PM

## 2020-07-27 NOTE — Progress Notes (Signed)
Trauma/Critical Care Follow Up Note  Subjective:    Overnight Issues:   Objective:  Vital signs for last 24 hours: Temp:  [97.5 F (36.4 C)-99 F (37.2 C)] 97.5 F (36.4 C) (04/22 0800) Pulse Rate:  [72-105] 105 (04/22 0900) Resp:  [10-24] 24 (04/22 0900) BP: (118-192)/(55-146) 187/97 (04/22 0900) SpO2:  [96 %-100 %] 98 % (04/22 0900) Weight:  [83.6 kg] 83.6 kg (04/21 1600)  Hemodynamic parameters for last 24 hours:    Intake/Output from previous day: 04/21 0701 - 04/22 0700 In: 1252 [I.V.:1045.3; IV Piggyback:206.7] Out: 150 [Urine:150]  Intake/Output this shift: No intake/output data recorded.  Vent settings for last 24 hours:    Physical Exam:  Gen: comfortable, no distress Neuro: f/c, but needs frequent redirection for dementia HEENT: PERRL Neck: supple CV: RRR Pulm: unlabored breathing Abd: soft, NT GU: clear yellow urine Extr: wwp, no edema  Results for orders placed or performed during the hospital encounter of 07/26/20 (from the past 24 hour(s))  Glucose, capillary     Status: None   Collection Time: 07/27/20  3:21 AM  Result Value Ref Range   Glucose-Capillary 94 70 - 99 mg/dL  CBC     Status: Abnormal   Collection Time: 07/27/20  5:32 AM  Result Value Ref Range   WBC 7.8 4.0 - 10.5 K/uL   RBC 4.21 3.87 - 5.11 MIL/uL   Hemoglobin 11.5 (L) 12.0 - 15.0 g/dL   HCT 40.9 81.1 - 91.4 %   MCV 87.6 80.0 - 100.0 fL   MCH 27.3 26.0 - 34.0 pg   MCHC 31.2 30.0 - 36.0 g/dL   RDW 78.2 95.6 - 21.3 %   Platelets 167 150 - 400 K/uL   nRBC 0.0 0.0 - 0.2 %  Basic metabolic panel     Status: Abnormal   Collection Time: 07/27/20  5:32 AM  Result Value Ref Range   Sodium 141 135 - 145 mmol/L   Potassium 3.3 (L) 3.5 - 5.1 mmol/L   Chloride 107 98 - 111 mmol/L   CO2 23 22 - 32 mmol/L   Glucose, Bld 92 70 - 99 mg/dL   BUN 17 8 - 23 mg/dL   Creatinine, Ser 0.86 0.44 - 1.00 mg/dL   Calcium 9.7 8.9 - 57.8 mg/dL   GFR, Estimated >46 >96 mL/min   Anion gap 11 5  - 15    Assessment & Plan: The plan of care was discussed with the bedside nurse for the day, who is in agreement with this plan and no additional concerns were raised.   Present on Admission: . TBI (traumatic brain injury) (HCC)    LOS: 1 day   Additional comments:I reviewed the patient's new clinical lab test results.   and I reviewed the patients new imaging test results.    Multiple falls  Code stroke - MRI negative for acute infarct Right wrist/Elbow fx- hand c/s, Dr. Merlyn Lot, in sugar tong splint but patient keeps removing, Dr. 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 Conchita Paris, no follow up scan needed. Keppra x7d R maxillary sinus, R lateral orbital wall and floor fxs with fat herniation - Dr. Leta Baptist has seen.  No acute surgical intervention.  May follow up prn.  Recommends outpatient ophthalmology exam Elevated Cr - normalized Abnormal appearing endometrium -can f/u with Gyn as o/p Hx heart murmur Memory deficit - unclear if this is new or not.  May need to  see neuro as an outpatient for balance issues pending therapies as well as memory issues. Hip pain - Patient reportedly walking with crutches at home. CT pelvis negative, R hip degenerative changes. 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 Conchita Paris as outpatient. VTE - SCDs, start LMWH FEN - D3 diet per speech Foley - none Follow up - TBD Dispo - 4NP  Diamantina Monks, MD Trauma & General Surgery Please use [email or web address] to contact on call provider  07/27/2020  *Care during the described time interval was provided by me. I have reviewed this patient's available data, including medical history, events of note, physical examination and test results as part of my evaluation.

## 2020-07-27 NOTE — Evaluation (Signed)
Physical Therapy Evaluation Patient Details Name: Danielle Kirby MRN: [id number] DOB: [birth year] Today's Date: 07/27/2020   History of Present Illness  This 75 y.o. female admitted 4/21 after multiple falls.  First fall was several days PTA where she fell down 4-6 stairs.  Then fell fell in the bathroom and was found by significant other unresponsive.  CT of brain small SDH or hygroma with suspected hemorrhage with ~28mm Right midline shift; Rt periorbital and pre maxillary soft tissue contusion.  Fx of all walls of the Rt maxillary sinus with layering hemosinus.  Additional fractures of the Rt lateral orbital wall and orbital floor with herniation of small amount of fat throgh orbital floor.  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  She was also found to have Rt wrist/elbow fx (NWB Rt UE).  PMH includes: cataracts, arthritis    Clinical Impression  Pt presents with condition above and deficits mentioned below, see PT Problem List. PTA, per significant other, pt was independent with all functional mobility and ADLs. Pt lives with her significant other in a split-level house with several stairs to enter/exit. Currently, pt displays generalized weakness, incoordination, impaired balance, decreased activity tolerance, significant cognitive deficits, and decreased Rt UE function.  She currently requires modAx2 for all functional mobility, including very short gait distances in the room with UE support. She is at high risk for falls. She currently presents with behaviors consistent with Ranchos Level V (confused, inappropriate), with some emerging level VI behaviors. Pt would greatly benefit from intensive therapy in the CIR setting to maximize pt independence and safety with all functional mobility. Will continue to follow acutely.    Follow Up Recommendations CIR;Supervision/Assistance - 24 hour    Equipment Recommendations  Other (comment) (TBD)    Recommendations for Other Services  Rehab consult     Precautions / Restrictions Precautions Precautions: Fall Precaution Comments: R UE NWB; R UE sling and sugar tong splint; L mitten; posey belt Restrictions Weight Bearing Restrictions: Yes RUE Weight Bearing: Non weight bearing      Mobility  Bed Mobility Overal bed mobility: Needs Assistance Bed Mobility: Supine to Sit     Supine to sit: Mod assist     General bed mobility comments: assist to initiate movement and assist to lift trunk    Transfers Overall transfer level: Needs assistance Equipment used: 1 person hand held assist;2 person hand held assist Transfers: Sit to/from [employer] Sit to Stand: Mod assist;+2 physical assistance;+2 safety/equipment Stand pivot transfers: Mod assist;+2 physical assistance;+2 safety/equipment       General transfer comment: assist to power up into standing.  She demonstrates heavy Rt lateral lean and requires assist to weight shift bil. to advance feet and assist to advance Lt foot  Ambulation/Gait Ambulation/Gait assistance: Mod assist;+2 physical assistance;+2 safety/equipment Gait Distance (Feet): 4 Feet Assistive device: 2 person hand held assist Gait Pattern/deviations: Decreased step length - right;Decreased step length - left;Decreased stride length;Shuffle;Decreased weight shift to left;Decreased stance time - left;Trunk flexed Gait velocity: reduced Gait velocity interpretation: <1.31 ft/sec, indicative of household ambulator General Gait Details: Pt with UE support with therapists on either side of her, cuing pt through visual, verbal, and tactile cues to take steps to L at EOB to recliner. Pt needing modAx2 to prevent her R lateral lean, facilitate weight shifting to L, and facilitate bil leg advancement. L knee buckling noted, needing intermittent blocking.  Stairs            Psychologist, prison and probation services  Modified Rankin (Stroke Patients Only)       Balance Overall balance  assessment: Needs assistance Sitting-balance support: Feet supported Sitting balance-Leahy Scale: Fair     Standing balance support: Single extremity supported;During functional activity Standing balance-Leahy Scale: Poor Standing balance comment: required assist +2 for static standing                             Pertinent Vitals/Pain Pain Assessment: No/denies pain    Home Living Family/patient expects to be discharged to:: Private residence Living Arrangements: Spouse/significant other Available Help at Discharge: Family;Available 24 hours/day Type of Home: House Home Access: Stairs to enter   [employer] of Steps: 6 Home Layout: Two level;Laundry or work area in [employer]: None Additional Comments: Pt lives with her significant other    Prior Function Level of Independence: Independent         Comments: Per pt's significant other, Pt was fully independent.  Drives. cleans, managed own finances and medications.  She worked for the [employer] for 30 years     Hand Dominance        Extremity/Trunk Assessment   Upper Extremity Assessment Upper Extremity Assessment: Defer to OT evaluation RUE Deficits / Details: Pt has removed the sugartong splint and sling.  Sling reapplied with her tolerating it fairly well.  Rt UE not formally assessed    Lower Extremity Assessment Lower Extremity Assessment: RLE deficits/detail;LLE deficits/detail RLE Deficits / Details: Weakness noted with functional mobility with poor R leg advancement (poor weight shift to L though) RLE Coordination: decreased fine motor;decreased gross motor LLE Deficits / Details: Weakness noted through functional mobility with L knee buckling and decreased L weight shift/stance LLE Coordination: decreased fine motor;decreased gross motor       Communication   Communication: No difficulties  Cognition Arousal/Alertness: Awake/alert Behavior During Therapy: Flat  affect Overall Cognitive Status: Impaired/Different from baseline Area of Impairment: Orientation;Attention;Memory;Following commands;Safety/judgement;Awareness;Rancho level               Rancho Levels of Cognitive Functioning Rancho Mirant Scales of Cognitive Functioning: Confused/inappropriate/non-agitated Orientation Level: Disoriented to;Time;Situation Current Attention Level: Sustained Memory: Decreased short-term memory Following Commands: Follows one step commands consistently;Follows multi-step commands inconsistently Safety/Judgement: Decreased awareness of deficits;Decreased awareness of safety Awareness:  (Pt has no awareness of injuries of reason for admission initially)   General Comments: Pt intially only oriented to self and [facility].  She follows one step commands and has no awareness of injuries of deficits.  At end of session, she was able to recall that it is April, "2021", and that she is in hosptal secondary to fall although she still denies injuries      General Comments General comments (skin integrity, edema, etc.): Pt has removed the sugartong splint and sling.  Sling reapplied with her tolerating it fairly well. Pt's signficant other present during session.    Exercises     Assessment/Plan    PT Assessment Patient needs continued PT services  PT Problem List Decreased strength;Decreased balance;Decreased activity tolerance;Decreased mobility;Decreased coordination;Decreased cognition;Decreased knowledge of use of DME;Decreased safety awareness;Decreased knowledge of precautions       PT Treatment Interventions DME instruction;Gait training;Stair training;Functional mobility training;Therapeutic activities;Therapeutic exercise;Balance training;Neuromuscular re-education;Cognitive remediation;Patient/family education    PT Goals (Current goals can be found in the Care Plan section)  Acute Rehab PT Goals Patient Stated Goal: Pt did not  state PT Goal Formulation: With patient/family Time  For Goal Achievement: 08/10/20 Potential to Achieve Goals: Good    Frequency Min 3X/week   Barriers to discharge        Co-evaluation PT/OT/SLP Co-Evaluation/Treatment: Yes Reason for Co-Treatment: Necessary to address cognition/behavior during functional activity;For patient/therapist safety;To address functional/ADL transfers PT goals addressed during session: Mobility/safety with mobility;Balance OT goals addressed during session: ADL's and self-care       AM-PAC PT "6 Clicks" Mobility  Outcome Measure Help needed turning from your back to your side while in a flat bed without using bedrails?: A Lot Help needed moving from lying on your back to sitting on the side of a flat bed without using bedrails?: A Lot Help needed moving to and from a bed to a chair (including a wheelchair)?: A Lot Help needed standing up from a chair using your arms (e.g., wheelchair or bedside chair)?: A Lot Help needed to walk in hospital room?: A Lot Help needed climbing 3-5 steps with a railing? : Total 6 Click Score: 11    End of Session Equipment Utilized During Treatment: Gait belt (R UE sling) Activity Tolerance: Patient tolerated treatment well Patient left: in chair;with call bell/phone within reach;with chair alarm set;with family/visitor present;with restraints reapplied Nurse Communication: Mobility status;Need for lift equipment (stedy) PT Visit Diagnosis: Unsteadiness on feet (R26.81);Other abnormalities of gait and mobility (R26.89);Muscle weakness (generalized) (M62.81);Repeated falls (R29.6);History of falling (Z91.81);Difficulty in walking, not elsewhere classified (R26.2);Other symptoms and signs involving the nervous system (R29.898)    Time: 0539-7673 PT Time Calculation (min) (ACUTE ONLY): 36 min   Charges:   PT Evaluation $PT Eval Moderate Complexity: 1 Mod          Raymond Gurney, PT, DPT Acute Rehabilitation  Services  Pager: [phone] Office: [phone]   Jewel Baize 07/27/2020, 2:51 PM

## 2020-07-27 NOTE — Progress Notes (Signed)
Ortho tech made aware that patient removed splint and shoulder immobilizer.

## 2020-07-27 NOTE — Evaluation (Signed)
Clinical/Bedside Swallow Evaluation Patient Details  Name: Danielle Kirby MRN: [id number] Date of Birth: 03-17-1946  Today's Date: 07/27/2020 Time: SLP Start Time (ACUTE ONLY): 0914 SLP Stop Time (ACUTE ONLY): 0940 SLP Time Calculation (min) (ACUTE ONLY): 26 min  Past Medical History:  Past Medical History:  Diagnosis Date  . Allergy   . Arthritis   . Cataract   . Heart murmur    Past Surgical History:  Past Surgical History:  Procedure Laterality Date  . BREAST SURGERY    . COSMETIC SURGERY    . EYE SURGERY    . TUBAL LIGATION     HPI:  Pt adm to [facility] after suffering multiple falls.  The patient has no recollection of her falls and isn't aware she even had falls.  She is unsure why she is in the hospital so most of her history is obtained from the chart as her significant other is not present currently either. Per report the patient fell on Sunday. She had been using crutches that day due to left hip pain. Significant other heard her fall down 4-6 stairs and found her at the bottom of the stairs. She complained of hip and facial pain but refused to go to the ED. Since that fall her significant other states that she has been more tired and lethargic. This morning she got up to use the bathroom and suffered a ground level fall, possibly secondary to a syncopal event. Initially she was able to ambulate but fell again therefore her significant other called EMS. Paramedics noted bruising and deformity to the right wrist, diminished use of the RUE, bruising around the right eye, and some difficulty with speech. Code stroke was called pre-hospital.   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  Trauma asked to see for admission.   Assessment / Plan / Recommendation Clinical Impression  Patient presents with funcitonal oropharyngeal swallow ability with testing limited by pt only accepting very small  boluses.  No focal CN deficits noted but pt has right facial fractures.  Pt accepted gingerale via straw (x approx 6 boluses), pudding (1/4 tsp amounts x4) and small bites of graham cracker(x3 bites). She is consistently questioning dates of items she is being given and "taste tests" the items provided.  With RN present, pill was given with puddin g- but pt took only very small bolus and pill was retained in anterior oral cavity  - wiht pt awareness as she opened her mouth to display.  Liquids given by RN to help swallow pills - which was effective to transit - subtle throat clear noted after swallow.  Next pill given with liquid did not result in cough or throat clear post-swallow.   Due to pt's facial fractures and concern for masticating hard items causing discomfort - recommend dys3/thin diet.  Pt self feeding may facilitate better intake.   Medications as tolerated.    Will follow up for cognitive linguistic evaluation and brief follow up re: swallowing due to subtle difficulties with mixed consistencies. SLP Visit Diagnosis: Dysphagia, unspecified (R13.10)    Aspiration Risk  Mild aspiration risk    Diet Recommendation Dysphagia 3 (Mech soft);Thin liquid   Liquid Administration via: Cup;Straw Medication Administration: Other (Comment) (as tolerated) Supervision: Staff to assist with self feeding Compensations: Slow rate;Small sips/bites Postural Changes: Seated upright at 90 degrees;Remain upright for at least 30 minutes after po intake    Other  Recommendations Oral Care Recommendations: Oral care BID   Follow up Recommendations   TBD     Frequency and Duration min 1 x/week  1 week       Prognosis Prognosis for Safe Diet Advancement: Good Barriers to Reach Goals: Cognitive deficits      Swallow Study   General Date of Onset: 07/27/20 HPI: Pt adm to [facility] after suffering multiple falls.  The patient has no recollection of her falls and isn't aware she even had falls.  She is  unsure why she is in the hospital so most of her history is obtained from the chart as her significant other is not present currently either. Per report the patient fell on Sunday. She had been using crutches that day due to left hip pain. Significant other heard her fall down 4-6 stairs and found her at the bottom of the stairs. She complained of hip and facial pain but refused to go to the ED. Since that fall her significant other states that she has been more tired and lethargic. This morning she got up to use the bathroom and suffered a ground level fall, possibly secondary to a syncopal event. Initially she was able to ambulate but fell again therefore her significant other called EMS. Paramedics noted bruising and deformity to the right wrist, diminished use of the RUE, bruising around the right eye, and some difficulty with speech. Code stroke was called pre-hospital.   Work up revealed Right wrist/Elbow fx, SDH vs hygroma with 35mm midline shift, and multiple facial fractures with some scans still pending.  Trauma asked to see for admission. Type of Study: Bedside Swallow Evaluation Previous Swallow Assessment: none in system Diet Prior to this Study: Regular;Thin liquids Temperature Spikes Noted: No Respiratory Status: Room air History of Recent Intubation: No Behavior/Cognition: Alert;Confused;Distractible;Requires cueing;Doesn't follow directions (inconsistently follows directions) Oral Care Completed by SLP: No Oral Cavity - Dentition: Adequate natural dentition Self-Feeding Abilities: Total assist (pt allowed SLP to feed her - right arm broken and decreased cognition with some agitation reported this am) Patient Positioning: Upright in bed;Other (comment) (reverse trendelenburg) Baseline Vocal Quality: Normal Volitional Cough: Other (Comment) (cued only to cough softly due to facial fxs) Volitional Swallow:  (DNT)    Oral/Motor/Sensory Function Overall Oral Motor/Sensory Function: Within  functional limits   Ice Chips Ice chips: Not tested   Thin Liquid Thin Liquid: Within functional limits Presentation: Straw    Nectar Thick Nectar Thick Liquid: Not tested   Honey Thick Honey Thick Liquid: Not tested   Puree Puree: Within functional limits Presentation: Spoon Other Comments: pt takes only very small boluses   Solid     Solid: Within functional limits Other Comments: slow matication and pt takes very small boluses - no oral pocketing      Chales Abrahams 07/27/2020,9:57 AM   Rolena Infante, MS [facility] SLP Acute Rehab Services Office [phone] Pager [phone]

## 2020-07-27 NOTE — Progress Notes (Signed)
Orthopedic Tech Progress Note Patient Details:  Danielle Kirby February 06, 1946 [id number]  Ortho Devices Type of Ortho Device: Sugartong splint Ortho Device/Splint Location: RUE Ortho Device/Splint Interventions: Ordered,Application,Adjustment   Post Interventions Patient Tolerated: Well Instructions Provided: Other (comment)   Michelle Piper 07/27/2020, 6:03 PM

## 2020-07-27 NOTE — Progress Notes (Signed)
Rehab Admissions Coordinator Note:  Patient was screened by Clois Dupes for appropriateness for an Inpatient Acute Rehab Consult per therapy recs. .  At this time, we are recommending Inpatient Rehab consult. I will place order per protocol.  Clois Dupes RN MSN 07/27/2020, 3:56 PM  I can be reached at [phone].

## 2020-07-28 LAB — BASIC METABOLIC PANEL
Anion gap: 7 (ref 5–15)
BUN: 11 mg/dL (ref 8–23)
CO2: 24 mmol/L (ref 22–32)
Calcium: 9.1 mg/dL (ref 8.9–10.3)
Chloride: 108 mmol/L (ref 98–111)
Creatinine, Ser: 0.64 mg/dL (ref 0.44–1.00)
GFR, Estimated: >60 mL/min (ref >60)
Glucose, Bld: 106 mg/dL — ABNORMAL HIGH (ref 70–99)
Potassium: 3.2 mmol/L — ABNORMAL LOW (ref 3.5–5.1)
Sodium: 139 mmol/L (ref 135–145)

## 2020-07-28 LAB — CBC
HCT: 30.8 % — ABNORMAL LOW (ref 36.0–46.0)
Hemoglobin: 9.7 g/dL — ABNORMAL LOW (ref 12.0–15.0)
MCH: 27.3 pg (ref 26.0–34.0)
MCHC: 31.5 g/dL (ref 30.0–36.0)
MCV: 86.8 fL (ref 80.0–100.0)
Platelets: 156 10*3/uL (ref 150–400)
RBC: 3.55 MIL/uL — ABNORMAL LOW (ref 3.87–5.11)
RDW: 14.2 % (ref 11.5–15.5)
WBC: 5.7 10*3/uL (ref 4.0–10.5)
nRBC: 0 % (ref 0.0–0.2)

## 2020-07-28 MED ORDER — POTASSIUM CHLORIDE 10 MEQ/100ML IV SOLN
10.0000 meq | INTRAVENOUS | Status: AC
  Administered 2020-07-28 (×4): 10 meq via INTRAVENOUS
  Filled 2020-07-28: qty 100

## 2020-07-28 MED ORDER — TRAMADOL HCL 50 MG PO TABS
50.0000 mg | ORAL_TABLET | Freq: Four times a day (QID) | ORAL | Status: DC | PRN
  Filled 2020-07-28: qty 1

## 2020-07-28 MED ORDER — ENOXAPARIN SODIUM 30 MG/0.3ML ~~LOC~~ SOLN
30.0000 mg | Freq: Two times a day (BID) | SUBCUTANEOUS | Status: DC
  Administered 2020-07-28 – 2020-08-02 (×11): 30 mg via SUBCUTANEOUS
  Filled 2020-07-28 (×9): qty 0.3

## 2020-07-28 MED ORDER — POTASSIUM CHLORIDE 20 MEQ PO PACK
40.0000 meq | PACK | Freq: Once | ORAL | Status: AC
  Administered 2020-07-28: 40 meq via ORAL
  Filled 2020-07-28: qty 2

## 2020-07-28 NOTE — Progress Notes (Signed)
Trauma Follow Up Note  Subjective:    Overnight Issues: No acute events  Objective:  Vital signs for last 24 hours: Temp:  [97.9 F (36.6 C)-99.8 F (37.7 C)] 98.3 F (36.8 C) (04/23 0753) Pulse Rate:  [68-85] 78 (04/23 0753) Resp:  [13-22] 13 (04/23 0753) BP: (129-158)/(68-95) 157/69 (04/23 0753) SpO2:  [94 %-100 %] 94 % (04/23 0753)  Intake/Output from previous day: 04/22 0701 - 04/23 0700 In: 1993.5 [P.O.:720; I.V.:1273.5] Out: 350 [Urine:350]  Intake/Output this shift: Total I/O In: 360 [P.O.:360] Out: -   Physical Exam:  Gen: comfortable, no distress Neuro: f/c, but needs frequent redirection for dementia HEENT: PERRL Neck: supple CV: RRR Pulm: unlabored breathing Abd: soft, NT Extr: wwp, no edema  Results for orders placed or performed during the hospital encounter of 07/26/20 (from the past 24 hour(s))  Basic metabolic panel     Status: Abnormal   Collection Time: 07/28/20  3:18 AM  Result Value Ref Range   Sodium 139 135 - 145 mmol/L   Potassium 3.2 (L) 3.5 - 5.1 mmol/L   Chloride 108 98 - 111 mmol/L   CO2 24 22 - 32 mmol/L   Glucose, Bld 106 (H) 70 - 99 mg/dL   BUN 11 8 - 23 mg/dL   Creatinine, Ser 2.77 0.44 - 1.00 mg/dL   Calcium 9.1 8.9 - 82.4 mg/dL   GFR, Estimated >23 >53 mL/min   Anion gap 7 5 - 15  CBC     Status: Abnormal   Collection Time: 07/28/20  3:18 AM  Result Value Ref Range   WBC 5.7 4.0 - 10.5 K/uL   RBC 3.55 (L) 3.87 - 5.11 MIL/uL   Hemoglobin 9.7 (L) 12.0 - 15.0 g/dL   HCT 61.4 (L) 43.1 - 54.0 %   MCV 86.8 80.0 - 100.0 fL   MCH 27.3 26.0 - 34.0 pg   MCHC 31.5 30.0 - 36.0 g/dL   RDW 08.6 76.1 - 95.0 %   Platelets 156 150 - 400 K/uL   nRBC 0.0 0.0 - 0.2 %    Assessment & Plan:   Present on Admission: . TBI (traumatic brain injury) (HCC)    LOS: 2 days    Multiple falls  Code stroke - MRI negative for acute infarct Right wrist/Elbow fx- hand c/s, Dr. Merlyn Lot, in sugar tong splint but patient keeps removing, Dr. 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 Conchita Paris, no follow up scan needed. Keppra x7d R maxillary sinus, R lateral orbital wall and floor fxs with fat herniation - Dr. Leta Baptist has seen.  No acute surgical intervention.  May follow up prn.  Recommends outpatient ophthalmology exam Elevated Cr - normalized Abnormal appearing endometrium -can f/u with Gyn as o/p Hx heart murmur Memory deficit - unclear if this is new or not.  May need to see neuro as an outpatient for balance issues pending therapies as well as memory issues. Hip pain - Patient reportedly walking with crutches at home. CT pelvis negative, R hip degenerative changes. 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 Conchita Paris as outpatient. VTE - SCDs, LMWH FEN - D3 diet per speech, hypokalemia-replaced today IV and p.o. Foley - none Follow up - TBD Dispo - 4NP  Berna Bue MD Trauma & General Surgery Please use [email or web address] to contact on call provider  07/28/2020

## 2020-07-28 NOTE — PMR Pre-admission (Shared)
PMR Admission Coordinator Pre-Admission Assessment  Patient: Danielle Kirby is an 75 y.o., female MRN: [id number] DOB: 08-Sep-1945 Height: 5\' 7"  (170.2 cm) Weight: 83.6 kg  Insurance Information HMO:     PPO:      PCP:      IPA:      80/20: yes     OTHER:  PRIMARY: Medicare A      Policy#: [id number]      Subscriber: patient CM Name:       Phone#:      Fax#:  Pre-Cert#:       Employer:  Benefits:  Phone #: verified eligibility online via [employer] on 07/29/20     Name:  Eff. Date: 11/06/10     Deduct: $1,556      Out of Pocket Max: NA      Life Max: NA CIR: 100% with Medicare approval      SNF: 100% for days 1-20, 80% days 21-100 Outpatient: 80%     Co-Pay: 20% Home Health: 100%      Co-Pay:  DME: 80%     Co-Pay: 20% Providers: pt's choice SECONDARY: 01/06/11      Policy#: Development worker, community     Phone#: [phone]  Financial Counselor:       Phone#:   The "Data Collection Information Summary" for patients in Inpatient Rehabilitation Facilities with attached "Privacy Act Statement-Health Care Records" was provided and verbally reviewed with: {CHL IP Patient Family [phone]  Emergency Contact Information Contact Information    Name Relation Home Work Danielle Kirby Significant other   [phone]      Current Medical History  Patient Admitting Diagnosis: TBI History of Present Illness: *** Complete NIHSS TOTAL: 1  Patient's medical record from [facility] has been reviewed by the rehabilitation admission coordinator and physician.  Past Medical History  Past Medical History:  Diagnosis Date  . Allergy   . Arthritis   . Cataract   . Heart murmur     Family History   family history includes Diabetes in her mother; Hypertension in her father.  Prior Rehab/Hospitalizations Has the patient had prior rehab or hospitalizations prior to admission? No  Has the patient had major surgery during 100 days prior to admission? No   Current  Medications  Current Facility-Administered Medications:  .  acetaminophen (TYLENOL) tablet 1,000 mg, 1,000 mg, Oral, Q4H PRN, [facility], MD, 1,000 mg at 07/27/20 0841 .  chlorhexidine (PERIDEX) 0.12 % solution 15 mL, 15 mL, Mouth Rinse, BID, 07/29/20, MD, 15 mL at 07/28/20 1024 .  Chlorhexidine Gluconate Cloth 2 % PADS 6 each, 6 each, Topical, Q0600, 07/30/20, MD, 6 each at 07/28/20 1024 .  docusate sodium (COLACE) capsule 100 mg, 100 mg, Oral, BID, 07/30/20, PA-C, 100 mg at 07/28/20 1023 .  enoxaparin (LOVENOX) injection 30 mg, 30 mg, Subcutaneous, Q12H, 07/30/20, MD .  haloperidol lactate (HALDOL) injection 2 mg, 2 mg, Intravenous, Q4H PRN, Isabel Caprice, MD, 2 mg at 07/28/20 1022 .  levETIRAcetam (KEPPRA) IVPB 500 mg/100 mL premix, 500 mg, Intravenous, Q12H, Maczis, 07/30/20, PA-C, Last Rate: 400 mL/hr at 07/28/20 1527, 500 mg at 07/28/20 1527 .  MEDLINE mouth rinse, 15 mL, Mouth Rinse, q12n4p, 07/30/20, MD, 15 mL at 07/28/20 1527 .  metoprolol tartrate (LOPRESSOR) injection 5 mg, 5 mg, Intravenous, Q6H PRN, 07/30/20, PA-C, 5 mg at 07/28/20 1548 .  ondansetron (ZOFRAN-ODT) disintegrating tablet 4 mg, 4  mg, Oral, Q6H PRN **OR** ondansetron (ZOFRAN) injection 4 mg, 4 mg, Intravenous, Q6H PRN, Maczis, Elmer Sow, PA-C .  polyethylene glycol (MIRALAX / GLYCOLAX) packet 17 g, 17 g, Oral, Daily PRN, Maczis, Elmer Sow, PA-C .  sodium chloride flush (NS) 0.9 % injection 10-40 mL, 10-40 mL, Intracatheter, Q12H, Barnetta Chapel, PA-C, 10 mL at 07/28/20 1024 .  sodium chloride flush (NS) 0.9 % injection 10-40 mL, 10-40 mL, Intracatheter, PRN, Barnetta Chapel, PA-C .  traMADol (ULTRAM) tablet 50 mg, 50 mg, Oral, Q6H PRN, Berna Bue, MD  Patients Current Diet:  Diet Order            DIET DYS 3 Room service appropriate? Yes; Fluid consistency: Thin  Diet effective now                 Precautions / Restrictions Precautions Precautions:  Fall Precaution Comments: R UE NWB; R UE sling and sugar tong splint; L mitten; posey belt Restrictions Weight Bearing Restrictions: Yes RUE Weight Bearing: Non weight bearing   Has the patient had 2 or more falls or a fall with injury in the past year? Yes  Prior Activity Level Community (5-7x/wk): 3-4 days/week out of house. Pt drives  Prior Functional Level Self Care: Did the patient need help bathing, dressing, using the toilet or eating? Independent  Indoor Mobility: Did the patient need assistance with walking from room to room (with or without device)? Independent  Stairs: Did the patient need assistance with internal or external stairs (with or without device)? Independent  Functional Cognition: Did the patient need help planning regular tasks such as shopping or remembering to take medications? Independent  Home Assistive Devices / Equipment Home Assistive Devices/Equipment: None Home Equipment: None  Prior Device Use: Indicate devices/aids used by the patient prior to current illness, exacerbation or injury? using a crutch d/t pain in hip  Current Functional Level Cognition  Overall Cognitive Status: Impaired/Different from baseline Current Attention Level: Sustained Orientation Level: Oriented to place,Disoriented to situation,Disoriented to time,Disoriented to place Following Commands: Follows one step commands consistently,Follows multi-step commands inconsistently Safety/Judgement: Decreased awareness of deficits,Decreased awareness of safety General Comments: Pt intially only oriented to self and [place] [facility].  She follows one step commands and has no awareness of injuries of deficits.  At end of session, she was able to recall that it is April, "2021", and that she is in hosptal secondary to fall although she still denies injuries [facility] Scales of Cognitive Functioning: Confused/inappropriate/non-agitated    Extremity Assessment (includes  Sensation/Coordination)  Upper Extremity Assessment: Defer to OT evaluation RUE Deficits / Details: Pt has removed the sugartong splint and sling.  Sling reapplied with her tolerating it fairly well.  Rt UE not formally assessed  Lower Extremity Assessment: RLE deficits/detail,LLE deficits/detail RLE Deficits / Details: Weakness noted with functional mobility with poor R leg advancement (poor weight shift to L though) RLE Coordination: decreased fine motor,decreased gross motor LLE Deficits / Details: Weakness noted through functional mobility with L knee buckling and decreased L weight shift/stance LLE Coordination: decreased fine motor,decreased gross motor    ADLs  Overall ADL's : Needs assistance/impaired Eating/Feeding: Minimal assistance,Sitting Grooming: Wash/dry hands,Wash/dry face,Oral care,Moderate assistance,Sitting Upper Body Bathing: Moderate assistance,Sitting Lower Body Bathing: Sit to/from stand,Maximal assistance Upper Body Dressing : Maximal assistance,Sitting Lower Body Dressing: Maximal assistance,Sit to/from stand Toilet Transfer: Moderate assistance,+2 for physical assistance,+2 for safety/equipment,Stand-pivot,BSC Toileting- Clothing Manipulation and Hygiene: Total assistance,Sit to/from stand Functional mobility during ADLs: Maximal assistance,+2  for physical assistance,+2 for safety/equipment    Mobility  Overal bed mobility: Needs Assistance Bed Mobility: Supine to Sit Supine to sit: Mod assist General bed mobility comments: assist to initiate movement and assist to lift trunk    Transfers  Overall transfer level: Needs assistance Equipment used: 1 person hand held assist,2 person hand held assist Transfers: Sit to/from [employer] Sit to Stand: Mod assist,+2 physical assistance,+2 safety/equipment Stand pivot transfers: Mod assist,+2 physical assistance,+2 safety/equipment General transfer comment: assist to power up into standing.  She  demonstrates heavy Rt lateral lean and requires assist to weight shift bil. to advance feet and assist to advance Lt foot    Ambulation / Gait / Stairs / Wheelchair Mobility  Ambulation/Gait Ambulation/Gait assistance: Mod assist,+2 physical assistance,+2 safety/equipment Gait Distance (Feet): 4 Feet Assistive device: 2 person hand held assist Gait Pattern/deviations: Decreased step length - right,Decreased step length - left,Decreased stride length,Shuffle,Decreased weight shift to left,Decreased stance time - left,Trunk flexed General Gait Details: Pt with UE support with therapists on either side of her, cuing pt through visual, verbal, and tactile cues to take steps to L at EOB to recliner. Pt needing modAx2 to prevent her R lateral lean, facilitate weight shifting to L, and facilitate bil leg advancement. L knee buckling noted, needing intermittent blocking. Gait velocity: reduced Gait velocity interpretation: <1.31 ft/sec, indicative of household ambulator    Posture / Balance Balance Overall balance assessment: Needs assistance Sitting-balance support: Feet supported Sitting balance-Leahy Scale: Fair Standing balance support: Single extremity supported,During functional activity Standing balance-Leahy Scale: Poor Standing balance comment: required assist +2 for static standing    Special needs/care consideration Skin Ecchymosis: face, hip/right, External Urinary catheter and Designated visitor [employer]	[place], significant other   Previous Home Environment (from acute therapy documentation) Living Arrangements: Spouse/significant other  Lives With: Significant other Available Help at Discharge: Family,Available 24 hours/day Type of Home: House Home Layout: Other (Comment) (split level house; basement on lower level/bedroom on upper level) Alternate Level Stairs-Number of Steps: 2 steps Home Access: Stairs to enter Entrance Stairs-Rails: Right,Left,Can reach both Entrance  Stairs-Number of Steps: 6-7 Bathroom Shower/Tub: Engineer, manufacturing systems: Standard Bathroom Accessibility: Yes How Accessible: Accessible via walker Home Care Services: No Additional Comments: Pt lives with her significant other  Discharge Living Setting Plans for Discharge Living Setting: Patient's home Type of Home at Discharge: House Discharge Home Layout: Other (Comment) (split level house: basement on lower level, bedroom on upper level) Discharge Home Access: Stairs to enter Entrance Stairs-Rails: Right,Left,Can reach both Entrance Stairs-Number of Steps: 6-7 Discharge Bathroom Shower/Tub: Tub/shower unit Discharge Bathroom Toilet: Standard Discharge Bathroom Accessibility: Yes How Accessible: Accessible via walker Does the patient have any problems obtaining your medications?: No  Social/Family/Support Systems Anticipated Caregiver: Marliss Coots, significant other Anticipated Caregiver's Contact Information: [phone] Caregiver Availability: 24/7 Discharge Plan Discussed with Primary Caregiver: Yes Is Caregiver In Agreement with Plan?: Yes Does Caregiver/Family have Issues with Lodging/Transportation while Pt is in Rehab?: No  Goals Patient/Family Goal for Rehab: *** Expected length of stay: *** Pt/Family Agrees to Admission and willing to participate: Yes Program Orientation Provided & Reviewed with Pt/Caregiver Including Roles  & Responsibilities: Yes  Decrease burden of Care through IP rehab admission: NA  Possible need for SNF placement upon discharge: Not anticipated  Patient Condition: {PATIENT'S CONDITION:22832}  Preadmission Screen Completed By:  Domingo Pulse, 07/28/2020 4:05 PM ______________________________________________________________________   Discussed status with Dr. Marland Kitchen on *** at *** and received approval for admission  today.  Admission Coordinator:  Domingo PulseLauren P Graves Madden, CCC-SLP, time ***Dorna Bloom/Date ***    Assessment/Plan: Diagnosis: 1. Does the need for close, 24 hr/day Medical supervision in concert with the patient's rehab needs make it unreasonable for this patient to be served in a less intensive setting? {yes_no_potentially:3041433} 2. Co-Morbidities requiring supervision/potential complications: *** 3. Due to {due ZO:1096045}to:3041434}, does the patient require 24 hr/day rehab nursing? {yes_no_potentially:3041433} 4. Does the patient require coordinated care of a physician, rehab nurse, PT, OT, and SLP to address physical and functional deficits in the context of the above medical diagnosis(es)? {yes_no_potentially:3041433} Addressing deficits in the following areas: {deficits:3041436} 5. Can the patient actively participate in an intensive therapy program of at least 3 hrs of therapy 5 days a week? {yes_no_potentially:3041433} 6. The potential for patient to make measurable gains while on inpatient rehab is {potential:3041437} 7. Anticipated functional outcomes upon discharge from inpatient rehab: {functional outcomes:304600100} PT, {functional outcomes:304600100} OT, {functional outcomes:304600100} SLP 8. Estimated rehab length of stay to reach the above functional goals is: *** 9. Anticipated discharge destination: {anticipated dc setting:21604} 10. Overall Rehab/Functional Prognosis: {potential:3041437}   MD Signature: ***

## 2020-07-28 NOTE — Plan of Care (Signed)
  Problem: Education: Goal: Knowledge of General Education information will improve Description: Including pain rating scale, medication(s)/side effects and non-pharmacologic comfort measures Outcome: Not Progressing Note: Not able to be re-oriented

## 2020-07-28 NOTE — Progress Notes (Signed)
Inpatient Rehab Admissions:  Inpatient Rehab Consult received.  I met with patient at the bedside for rehabilitation assessment and to discuss goals and expectations of an inpatient rehab admission.  Pt also gave permission to call significant other, [name].  Spoke with him on the phone. Pt and [name] acknowledged understanding of goals and expectations. Both interested in pt pursuing CIR.  Will continue to follow.   Signed: Gayland Curry, [place], [place] Admissions Coordinator [phone]

## 2020-07-28 NOTE — Consult Note (Signed)
Physical Medicine and Rehabilitation Consult Reason for Consult: Altered mental status/TBI Referring Physician: Dr. Violeta Gelinas   HPI: Danielle Kirby is a 75 y.o. right-handed female with unremarkable past medical history except cataracts and osteoarthritis.  Per chart review patient lives with significant other.  Independent prior to admission and active.  Two-level home with 6 steps to entry.  Presented 07/26/2020 after reports of multiple falls.  The first fall was several days prior to admission where she fell down 4-6 stairs.  Then she fell again in the bathroom and found by significant other unresponsive.  Admission chemistries unremarkable potassium 3.4 creatinine 1.08, alcohol negative.  Cranial CT scan showed a small right thalamocapsular hypodensity possibly representing age-indeterminate lacunar infarct.  Small 3 mm largely low-density left cerebral convexity subdural collection suspicious for subdural hematoma or hygroma likely in part chronic.  Right periorbital and premaxillary soft tissue contusion.  Fractures of walls of the right maxillary sinus with layering hemosinus.  Additional fractures of the right lateral orbital wall and orbital floor with herniation small amount of fat through the orbital floor defect and involvement of the infraorbital foramen.  CT angiogram head and neck no emergent large vessel occlusion or hemodynamically significant proximal stenosis intracranially.  Approximately 50% stenosis of the proximal left ICA secondary to predominantly calcified atherosclerosis.  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  MRI of the brain showed no acute infarct.  Numerous small foci of susceptibility artifact within the posterior right temporal and right parietal occipital region without associated edema or hyperdensity compatible with prior microhemorrhages.  MRI lumbar spine showed mild multilevel degenerative  changes as well as incidental abnormal appearance of the endometrium partially imaged but suspicious for possible cancer.  CT of the right wrist/elbow showed comminuted mildly impacted intra-articular fracture of the distal radius, minimally displaced ulnar styloid fracture.  Follow-up neurosurgery regards to small amount of subdural blood as well as incidental left supraclinoid ICA aneurysm follow-up outpatient Dr. Conchita Paris.  Dr. Merlyn Lot follow-up for comminuted distal radius fracture and minimally displaced radial head fracture recommend sugar-tong splint follow-up in office 1 week there was some discussion of possible ORIF of distal radius.  Follow-up Dr.Thimmappa in regards to facial fractures nonoperative follow-up outpatient as well as recommendations of ophthalmology eye exam.  Therapy evaluations completed right upper extremity nonweightbearing, right upper extremity sling and sugar-tong splint.  Placed on Lovenox for DVT prophylaxis.  Maintained on Keppra for seizure prophylaxis.  Patient did have episodes of agitation restlessness requiring Haldol.  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  The patient does not recall any of her injuries from her falls.  She remains disoriented with poor awareness of situation  Review of Systems  Constitutional: Negative for chills and fever.  HENT: Negative for hearing loss.   Eyes: Negative for blurred vision and double vision.  Respiratory: Negative for cough and shortness of breath.   Cardiovascular: Negative for chest pain, palpitations and leg swelling.  Gastrointestinal: Positive for constipation. Negative for heartburn, nausea and vomiting.  Genitourinary: Negative for dysuria, flank pain and hematuria.  Musculoskeletal: Positive for falls and myalgias.  Skin: Negative for rash.  Neurological: Positive for loss of consciousness.  All other systems reviewed and are  negative.  Past Medical History:  Diagnosis Date  . Allergy   . Arthritis   . Cataract   . Heart murmur    Past  Surgical History:  Procedure Laterality Date  . BREAST SURGERY    . COSMETIC SURGERY    . EYE SURGERY    . TUBAL LIGATION     Family History  Problem Relation Age of Onset  . Diabetes Mother   . Hypertension Father    Social History:  reports that she has never smoked. She has never used smokeless tobacco. She reports that she does not drink alcohol and does not use drugs. Allergies:  Allergies  Allergen Reactions  . Penicillins     REACTION: hives   Medications Prior to Admission  Medication Sig Dispense Refill  . OVER THE COUNTER MEDICATION daily.    . methocarbamol (ROBAXIN) 500 MG tablet Take 1 tablet (500 mg total) by mouth 2 (two) times daily. (Patient not taking: No sig reported) 20 tablet 0    Home: Home Living Family/patient expects to be discharged to:: Private residence Living Arrangements: Spouse/significant other Available Help at Discharge: Family,Available 24 hours/day Type of Home: House Home Access: Stairs to enter Entergy CorporationEntrance Stairs-Number of Steps: 6-7 Entrance Stairs-Rails: Right,Left,Can reach both Home Layout: Other (Comment) (split level house; basement on lower level/bedroom on upper level) Alternate Level Stairs-Number of Steps: 2 steps Bathroom Shower/Tub: Engineer, manufacturing systemsTub/shower unit Bathroom Toilet: Standard Bathroom Accessibility: Yes Home Equipment: None Additional Comments: Pt lives with her significant other  Lives With: Significant other  Functional History: Prior Function Level of Independence: Independent Comments: Per pt's significant other, Pt was fully independent.  Drives. cleans, managed own finances and medications.  She worked for the [employer] for 30 years Functional Status:  Mobility: Bed Mobility Overal bed mobility: Needs Assistance Bed Mobility: Supine to Sit Supine to sit: Mod assist General bed mobility  comments: assist to initiate movement and assist to lift trunk Transfers Overall transfer level: Needs assistance Equipment used: 1 person hand held assist,2 person hand held assist Transfers: Sit to/from Chub[employer] CorporationStand,Stand Pivot Transfers Sit to Stand: Mod assist,+2 physical assistance,+2 safety/equipment Stand pivot transfers: Mod assist,+2 physical assistance,+2 safety/equipment General transfer comment: assist to power up into standing.  She demonstrates heavy Rt lateral lean and requires assist to weight shift bil. to advance feet and assist to advance Lt foot Ambulation/Gait Ambulation/Gait assistance: Mod assist,+2 physical assistance,+2 safety/equipment Gait Distance (Feet): 4 Feet Assistive device: 2 person hand held assist Gait Pattern/deviations: Decreased step length - right,Decreased step length - left,Decreased stride length,Shuffle,Decreased weight shift to left,Decreased stance time - left,Trunk flexed General Gait Details: Pt with UE support with therapists on either side of her, cuing pt through visual, verbal, and tactile cues to take steps to L at EOB to recliner. Pt needing modAx2 to prevent her R lateral lean, facilitate weight shifting to L, and facilitate bil leg advancement. L knee buckling noted, needing intermittent blocking. Gait velocity: reduced Gait velocity interpretation: <1.31 ft/sec, indicative of household ambulator    ADL: ADL Overall ADL's : Needs assistance/impaired Eating/Feeding: Minimal assistance,Sitting Grooming: Wash/dry hands,Wash/dry face,Oral care,Moderate assistance,Sitting Upper Body Bathing: Moderate assistance,Sitting Lower Body Bathing: Sit to/from stand,Maximal assistance Upper Body Dressing : Maximal assistance,Sitting Lower Body Dressing: Maximal assistance,Sit to/from stand Toilet Transfer: Moderate assistance,+2 for physical assistance,+2 for safety/equipment,Stand-pivot,BSC Toileting- Clothing Manipulation and Hygiene: Total  assistance,Sit to/from stand Functional mobility during ADLs: Maximal assistance,+2 for physical assistance,+2 for safety/equipment  Cognition: Cognition Overall Cognitive Status: Impaired/Different from baseline Arousal/Alertness: Lethargic Orientation Level: Oriented to person,Oriented to place,Oriented to time,Disoriented to situation Attention: Focused Focused Attention: Impaired Focused Attention Impairment: Verbal basic,Functional basic Memory: Impaired Memory Impairment: Other (comment) (  unable to assess secondary to very poor attention) Awareness: Impaired Awareness Impairment: Intellectual impairment Problem Solving: Impaired Problem Solving Impairment: Verbal basic,Functional basic Behaviors: Restless Safety/Judgment: Impaired [place] [address] Scales of Cognitive Functioning: Confused/inappropriate/non-agitated Cognition Arousal/Alertness: Awake/alert Behavior During Therapy: Flat affect Overall Cognitive Status: Impaired/Different from baseline Area of Impairment: Orientation,Attention,Memory,Following commands,Safety/judgement,Awareness,[place] level Orientation Level: Disoriented to,Time,Situation Current Attention Level: Sustained Memory: Decreased short-term memory Following Commands: Follows one step commands consistently,Follows multi-step commands inconsistently Safety/Judgement: Decreased awareness of deficits,Decreased awareness of safety Awareness:  (Pt has no awareness of injuries of reason for admission initially) General Comments: Pt intially only oriented to self and [place] [facility].  She follows one step commands and has no awareness of injuries of deficits.  At end of session, she was able to recall that it is April, "2021", and that she is in hosptal secondary to fall although she still denies injuries  Blood pressure (!) 154/103, pulse (!) 102, temperature 99 F (37.2 C), temperature source Oral, resp. rate 19, height 5\' 7"  (1.702 m), weight 83.6 kg,  SpO2 99 %. Physical Exam Vitals reviewed.  Constitutional:      Comments: Patient initially alert but then became drowsy toward the end of the exam.  HENT:     Head: Normocephalic and atraumatic.     Mouth/Throat:     Mouth: Mucous membranes are moist.  Eyes:     Extraocular Movements: Extraocular movements intact.     Conjunctiva/sclera: Conjunctivae normal.     Pupils: Pupils are equal, round, and reactive to light.  Cardiovascular:     Rate and Rhythm: Regular rhythm. Tachycardia present.     Heart sounds: Normal heart sounds.  Pulmonary:     Effort: Pulmonary effort is normal. No respiratory distress.     Breath sounds: Normal breath sounds. No wheezing.  Abdominal:     General: Abdomen is flat. Bowel sounds are normal. There is no distension.     Palpations: Abdomen is soft.  Musculoskeletal:     Comments: Right wrist in a splint.  No pain with finger range of motion.  Patient does have some swelling of her right digits. Patient has effusion left knee.  Pain with left knee range of motion.  Skin:    General: Skin is warm and dry.  Neurological:     Mental Status: She is alert.     Comments: Patient is alert in no acute distress.  Makes eye contact with examiner.  Noted multiple bruising around the face.  She does provide her name and age but very poor historian in regards to hospital course Oriented to person and place but not time, thought the month was September, thought the year was 2000  Right upper extremity did not do muscle testing secondary to wrist fracture patient in sling and wrist splint. Right lower extremity has 4-5 strength Left upper extremity is 5/5 strength Left lower extremity is limited by knee pain for testing, poor participation with manual muscle testing as the patient was becoming more lethargic   Psychiatric:        Behavior: Behavior normal.     No results found for this or any previous visit (from the past 24 hour(s)). No results  found.   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 Does the need for close, 24 hr/day medical supervision in concert with the patient's rehab needs make it unreasonable for this patient to be served in a less intensive setting? Yes 2. Co-Morbidities requiring supervision/potential  complications: Left knee effusion, right radial fracture, facial fractures, urinary incontinence 3. Due to bladder management, bowel management, safety, skin/wound care, disease management, medication administration, pain management and patient education, does the patient require 24 hr/day rehab nursing? Yes 4. Does the patient require coordinated care of a physician, rehab nurse, therapy disciplines of PT, OT, speech to address physical and functional deficits in the context of the above medical diagnosis(es)? Yes Addressing deficits in the following areas: balance, endurance, locomotion, strength, transferring, bowel/bladder control, bathing, dressing, feeding, grooming, toileting, cognition and psychosocial support 5. Can the patient actively participate in an intensive therapy program of at least 3 hrs of therapy per day at least 5 days per week? Potentially 6. The potential for patient to make measurable gains while on inpatient rehab is good 7. Anticipated functional outcomes upon discharge from inpatient rehab are supervision  with PT, supervision and min assist with OT, supervision with SLP. 8. Estimated rehab length of stay to reach the above functional goals is: 17 to 20 days 9. Anticipated discharge destination: Home 10. Overall Rehab/Functional Prognosis: good  RECOMMENDATIONS: This patient's condition is appropriate for continued rehabilitative care in the following setting: CIR once left knee swelling is evaluated as well as tachycardia Patient has agreed to participate in recommended program. Yes Note that insurance prior authorization may be  required for reimbursement for recommended care.  Comment:   Charlton Amor, PA-C 07/30/2020   "I have personally performed a face to face diagnostic evaluation of this patient.  Additionally, I have reviewed and concur with the physician assistant's documentation above." Erick Colace M.D. [facility] Fellow Am Acad of Phys Med and Rehab Diplomate Am Board of Electrodiagnostic Med Fellow Am Board of Interventional Pain

## 2020-07-29 MED ORDER — HYDRALAZINE HCL 20 MG/ML IJ SOLN
10.0000 mg | INTRAMUSCULAR | Status: DC | PRN
  Administered 2020-07-29 – 2020-07-31 (×3): 10 mg via INTRAVENOUS
  Filled 2020-07-29 (×3): qty 1

## 2020-07-29 NOTE — Evaluation (Signed)
Speech Language Pathology Evaluation Patient Details Name: Danielle Kirby MRN: [id number] DOB: 1946/03/06 Today's Date: 07/29/2020 Time: 7209-4709 SLP Time Calculation (min) (ACUTE ONLY): 15 min  Problem List:  Patient Active Problem List   Diagnosis Date Noted  . 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 Degenerative arthritis of hip 05/23/2011   Past Medical History:  Past Medical History:  Diagnosis Date  . Allergy   . Arthritis   . Cataract   . Heart murmur    Past Surgical History:  Past Surgical History:  Procedure Laterality Date  . BREAST SURGERY    . COSMETIC SURGERY    . EYE SURGERY    . TUBAL LIGATION     HPI:  Pt adm to [facility] after suffering multiple falls.  The patient has no recollection of her falls and isn't aware she even had falls.  She is unsure why she is in the hospital so most of her history is obtained from the chart as her significant other is not present currently either. Per report the patient fell on Sunday. She had been using crutches that day due to left hip pain. Significant other heard her fall down 4-6 stairs and found her at the bottom of the stairs. She complained of hip and facial pain but refused to go to the ED. Since that fall her significant other states that she has been more tired and lethargic. This morning she got up to use the bathroom and suffered a ground level fall, possibly secondary to a syncopal event. Initially she was able to ambulate but fell again therefore her significant other called EMS. Paramedics noted bruising and deformity to the right wrist, diminished use of the RUE, bruising around the right eye, and some difficulty with speech. Code stroke was called pre-hospital.   Work up revealed Right wrist/Elbow fx, SDH vs hygroma with 28mm midline shift, and multiple facial fractures with some scans still pending.  Trauma asked to see for  admission.   Assessment / Plan / Recommendation Clinical Impression  Patient presents with a severe cognitive impairment secondary to TBI from multiple falls and currently presenting at [facility] level V (confused, inappropriate). When SLP arrived in room, patient in bed and seemed to be feeling around her lunch tray but keeping her eyes closed. Her sister reported patient was able to feed herself some ice cream. Patient kept eyes closed unless cued; responses were brief and no elaboration. She stated her age as "3", year as "2006" and that she was "in the apartment". When SLP told her she was in the hospital, she did reply, "thats what they tell me" but she was not able later recall this, stating again she was "in the apartment". She followed basic commands to point to nose and point to TV, however her attention and ability to initiate, actively participate and maintain actions is significantly impaired. She does not demonstrate any awareness to surroundings and is not fully alert. CIR is already in process which this SLP is in full agreement with. Patient will benefit from short term acute care SLP intervention until she transitions to next venue of care.    SLP Assessment  SLP Recommendation/Assessment: Patient needs continued Speech Lanaguage Pathology Services SLP Visit Diagnosis: Cognitive communication deficit (R41.841)    Follow Up Recommendations  24 hour supervision/assistance;Inpatient Rehab    Frequency and Duration min 2x/week  1 week  SLP Evaluation Cognition  Overall Cognitive Status: Impaired/Different from baseline Arousal/Alertness: Lethargic Orientation Level: Oriented to person;Disoriented to time;Disoriented to situation;Disoriented to place Attention: Focused Focused Attention: Impaired Focused Attention Impairment: Verbal basic;Functional basic Memory: Impaired Memory Impairment: Other (comment) (unable to assess secondary to very poor attention) Awareness:  Impaired Awareness Impairment: Intellectual impairment Problem Solving: Impaired Problem Solving Impairment: Verbal basic;Functional basic Behaviors: Restless Safety/Judgment: Impaired [place] Scales of Cognitive Functioning: Confused/inappropriate/non-agitated       Comprehension  Auditory Comprehension Overall Auditory Comprehension: Impaired Yes/No Questions: Not tested Commands: Impaired One Step Basic Commands: 50-74% accurate Interfering Components: Attention;Processing speed EffectiveTechniques: Extra processing time;Repetition;Visual/Gestural cues Visual Recognition/Discrimination Discrimination: Not tested Reading Comprehension Reading Status: Not tested    Expression Expression Primary Mode of Expression: Verbal Verbal Expression Overall Verbal Expression: Impaired Pragmatics: Impairment Impairments: Abnormal affect;Eye contact Interfering Components: Attention Non-Verbal Means of Communication: Not applicable   Oral / Motor  Oral Motor/Sensory Function Overall Oral Motor/Sensory Function: Within functional limits   GO                    Angela Nevin, MA, CCC-SLP Speech Therapy

## 2020-07-29 NOTE — Progress Notes (Signed)
   Subjective/Chief Complaint: No complaints this morning No events overnight   Objective: Vital signs in last 24 hours: Temp:  [98 F (36.7 C)-99.4 F (37.4 C)] 99.4 F (37.4 C) (04/24 0753) Pulse Rate:  [83-99] 99 (04/24 0753) Resp:  [15-33] 33 (04/24 0753) BP: (124-194)/(57-111) 128/57 (04/24 0753) SpO2:  [94 %-98 %] 98 % (04/24 0753) Last BM Date:  (PTA)  Intake/Output from previous day: 04/23 0701 - 04/24 0700 In: 1120 [P.O.:720; IV Piggyback:400] Out: 2200 [Urine:2200] Intake/Output this shift: No intake/output data recorded.  Exam: Awake Follows commands CV RRR Resp clear, non labored Abdomen non-tender  Lab Results:  Recent Labs    07/27/20 0532 07/28/20 0318  WBC 7.8 5.7  HGB 11.5* 9.7*  HCT 36.9 30.8*  PLT 167 156   BMET Recent Labs    07/27/20 0532 07/28/20 0318  NA 141 139  K 3.3* 3.2*  CL 107 108  CO2 23 24  GLUCOSE 92 106*  BUN 17 11  CREATININE 0.74 0.64  CALCIUM 9.7 9.1   PT/INR Recent Labs    07/26/20 0852  LABPROT 14.2  INR 1.1   ABG No results for input(s): PHART, HCO3 in the last 72 hours.  Invalid input(s): PCO2, PO2  Studies/Results: No results found.  Anti-infectives: Anti-infectives (From admission, onward)   None      Assessment/Plan: . 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 Lorraine Lax sugar tong splint but patient keeps removing, Dr. 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 Conchita Paris, no follow up scan needed. Keppra x7d R maxillary sinus, R lateral orbital wall and floor fxs with fat herniation - Dr. Annette Stable seen. No acute surgical intervention. May follow up prn. Recommends outpatient ophthalmology exam Elevated Cr - normalized Abnormal appearing endometrium -can f/u with Gyn as o/p Hx heart murmur Memory  deficit- unclear if this is new or not. 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 Outpatient follow up L supraclinoid ICA aneurysm- incidental find, can follow up with Dr. Conchita Paris as outpatient. VTE - SCDs, LMWH FEN -D3 diet per speech, hypokalemia-replaced today IV and p.o. Foley -none Follow up -TBD Dispo - 4NP, Rehab is seeing the patient  Continue working with PT Following HTN    LOS: 3 days    Abigail Miyamoto 07/29/2020

## 2020-07-29 NOTE — Plan of Care (Signed)
  Problem: Education: Goal: Knowledge of General Education information will improve Description: Including pain rating scale, medication(s)/side effects and non-pharmacologic comfort measures Outcome: Progressing   Problem: Clinical Measurements: Goal: Respiratory complications will improve Outcome: Progressing   

## 2020-07-29 NOTE — Progress Notes (Signed)
Patient BP 194/101 RN rechecked manually 190/105. Paged Oncall MD. Hydralazine 10mg  Given. Will continue to monitor.

## 2020-07-30 ENCOUNTER — Inpatient Hospital Stay (HOSPITAL_COMMUNITY): Payer: Medicare Other

## 2020-07-30 DIAGNOSIS — S069X0A Unspecified intracranial injury without loss of consciousness, initial encounter: Secondary | ICD-10-CM | POA: Diagnosis not present

## 2020-07-30 DIAGNOSIS — M7989 Other specified soft tissue disorders: Secondary | ICD-10-CM

## 2020-07-30 LAB — BASIC METABOLIC PANEL
Anion gap: 10 (ref 5–15)
BUN: 7 mg/dL — ABNORMAL LOW (ref 8–23)
CO2: 26 mmol/L (ref 22–32)
Calcium: 9.5 mg/dL (ref 8.9–10.3)
Chloride: 99 mmol/L (ref 98–111)
Creatinine, Ser: 0.65 mg/dL (ref 0.44–1.00)
GFR, Estimated: >60 mL/min (ref >60)
Glucose, Bld: 116 mg/dL — ABNORMAL HIGH (ref 70–99)
Potassium: 3.8 mmol/L (ref 3.5–5.1)
Sodium: 135 mmol/L (ref 135–145)

## 2020-07-30 MED ORDER — AMLODIPINE BESYLATE 5 MG PO TABS
5.0000 mg | ORAL_TABLET | Freq: Every day | ORAL | Status: DC
  Administered 2020-07-30 – 2020-08-02 (×4): 5 mg via ORAL
  Filled 2020-07-30 (×4): qty 1

## 2020-07-30 NOTE — Progress Notes (Signed)
Physical Therapy Treatment Patient Details Name: Danielle Kirby MRN: [id number] DOB: 10-Jul-1945 Today's Date: 07/30/2020    History of Present Illness This 75 y.o. female admitted 4/21 after multiple falls.  First fall was several days PTA where she fell down 4-6 stairs.  Then fell fell in the bathroom and was found by significant other unresponsive.  CT of brain small SDH or hygroma with suspected hemorrhage with ~71mm Right midline shift; Rt periorbital and pre maxillary soft tissue contusion.  Fx of all walls of the Rt maxillary sinus with layering hemosinus.  Additional fractures of the Rt lateral orbital wall and orbital floor with herniation of small amount of fat throgh orbital floor.  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  She was also found to have Rt wrist/elbow fx (NWB Rt UE).  PMH includes: cataracts, arthritis    PT Comments    Upon assessment, pt with acute left knee pain, which is warm to touch and swollen. Pt requiring increased assist with bed mobility and is unable to stand with two person assist. RN/PA notified and PA ordered imaging and Doppler of L knee. Pt able to follow one to three step commands today and is oriented to self and year. Presents with behaviors consistent with [place]. Continue to recommend comprehensive inpatient rehab (CIR) for post-acute therapy needs.    Follow Up Recommendations  CIR;Supervision/Assistance - 24 hour     Equipment Recommendations  Other (comment) (TBD)    Recommendations for Other Services       Precautions / Restrictions Precautions Precautions: Fall Precaution Comments: RUE sling and sugar tong splint Required Braces or Orthoses: Sling Restrictions Weight Bearing Restrictions: Yes RUE Weight Bearing: Non weight bearing    Mobility  Bed Mobility Overal bed mobility: Needs Assistance Bed Mobility: Supine to Sit;Sit to Supine     Supine to sit: +2 for physical assistance;Max assist Sit to supine:  Max assist;+2 for physical assistance   General bed mobility comments: maxA + 2 for bed mobility, pt able to move RLE with cues for initiating, requires assist with LLE and trunk guidance    Transfers Overall transfer level: Needs assistance Equipment used: 2 person hand held assist Transfers: Sit to/from Stand Sit to Stand: +2 physical assistance;Max assist         General transfer comment: Only able to achieve limited hip clearance with maxA + 2, limited by knee pain  Ambulation/Gait                 Stairs             Wheelchair Mobility    Modified Rankin (Stroke Patients Only)       Balance Overall balance assessment: Needs assistance Sitting-balance support: Feet supported Sitting balance-Leahy Scale: Good                                      Cognition Arousal/Alertness: Awake/alert Behavior During Therapy: Flat affect Overall Cognitive Status: Impaired/Different from baseline Area of Impairment: Orientation;Attention;Memory;Safety/judgement;Awareness;Rancho level               Rancho Levels of Cognitive Functioning [place] Mirant Scales of Cognitive Functioning: Confused/appropriate Orientation Level: Disoriented to;Time;Situation;Place Current Attention Level: Sustained Memory: Decreased short-term memory   Safety/Judgement: Decreased awareness of deficits;Decreased awareness of safety Awareness:  (Pt has no awareness of injuries of reason for admission initially)   General Comments: Pt alert, able to  follow up to 3 step commands. She is able to correctly state she is in a "hospital," when given options, but unable to state location. Able to state the year was 2022, did not correctly state month when given options and unable to recall after period of time.      Exercises General Exercises - Upper Extremity Digit Composite Flexion: AAROM;Right;10 reps;Seated General Exercises - Lower Extremity Long Arc Quad: Right;10  reps;Seated Other Exercises Other Exercises: Functional reaching with LUE in all planes    General Comments        Pertinent Vitals/Pain Pain Assessment: Faces Faces Pain Scale: Hurts whole lot Pain Location: L knee with movement or attempts to weight bear Pain Descriptors / Indicators: Grimacing;Guarding Pain Intervention(s): Limited activity within patient's tolerance;Monitored during session    Home Living                      Prior Function            PT Goals (current goals can now be found in the care plan section) Acute Rehab PT Goals Patient Stated Goal: Pt did not state PT Goal Formulation: With patient/family Time For Goal Achievement: 08/10/20 Potential to Achieve Goals: Good Progress towards PT goals: Not progressing toward goals - comment (limited by knee pain)    Frequency    Min 4X/week      PT Plan Frequency needs to be updated    Co-evaluation              AM-PAC PT "6 Clicks" Mobility   Outcome Measure  Help needed turning from your back to your side while in a flat bed without using bedrails?: Total Help needed moving from lying on your back to sitting on the side of a flat bed without using bedrails?: Total Help needed moving to and from a bed to a chair (including a wheelchair)?: Total Help needed standing up from a chair using your arms (e.g., wheelchair or bedside chair)?: Total Help needed to walk in hospital room?: Total   6 Click Score: 5    End of Session Equipment Utilized During Treatment: Gait belt (R UE sling) Activity Tolerance: Patient limited by pain Patient left: in bed;with bed alarm set;with call bell/phone within reach Nurse Communication: Mobility status;Other (comment) (L knee pain) PT Visit Diagnosis: Unsteadiness on feet (R26.81);Other abnormalities of gait and mobility (R26.89);Muscle weakness (generalized) (M62.81);Repeated falls (R29.6);History of falling (Z91.81);Difficulty in walking, not elsewhere  classified (R26.2);Other symptoms and signs involving the nervous system (R29.898)     Time: 8921-1941 PT Time Calculation (min) (ACUTE ONLY): 29 min  Charges:  $Therapeutic Activity: 23-37 mins                     Lillia Pauls, PT, DPT Acute Rehabilitation Services Pager [phone] Office [phone]    Norval Morton 07/30/2020, 3:28 PM

## 2020-07-30 NOTE — Progress Notes (Signed)
  Speech Language Pathology Treatment: Cognitive-Linquistic;Dysphagia  Patient Details Name: MARDY HOPPE MRN: [id number] DOB: Sep 26, 1945 Today's Date: 07/30/2020 Time: 1062-6948 SLP Time Calculation (min) (ACUTE ONLY): 19 min  Assessment / Plan / Recommendation Clinical Impression  Dysphagia and cognitive treatment with [name], pt's significant other visiting. Her behaviors resemble a [place] this session (confused;appropriate). Followed simple commands, comprehending information from therapist with fluctuating sustained attention. Held her head with eyes closed at times but denied headache. She needed assist for orientation to place, month and situation and repetition throughout the session and did state "hospital" given extra time. She continues to require assist for problem solving, awareness and reasoning as she was completely independent prior to TBI and agree with inpatient CIR.   HPI HPI: 75 yr old admitted after suffering multiple falls. 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 PMH; heart murmor, cataract, allergy. Significant other states pt is independent at home.      SLP Plan  All goals met;Discharge SLP treatment due to (comment)       Recommendations  Diet recommendations: Thin liquid;Regular Liquids provided via: Cup;Straw Medication Administration: Whole meds with liquid Supervision: Patient able to self feed;Intermittent supervision to cue for compensatory strategies Compensations: Slow rate;Small sips/bites;Lingual sweep for clearance of pocketing Postural Changes and/or Swallow Maneuvers: Seated upright 90 degrees                General recommendations: Rehab consult Oral Care Recommendations: Oral care BID Follow up Recommendations: Inpatient Rehab Plan: All goals met;Discharge SLP treatment due to  (comment)                       Houston Siren 07/30/2020, 11:40 AM  Orbie Pyo Colvin Caroli.Ed Risk analyst [phone] Office [phone]

## 2020-07-30 NOTE — Progress Notes (Signed)
RN and therapy stopped me while on the floor to look at this patient's left knee.  It was normal on admission.  It is now fairly swollen and tender.  She has had no other injury since admission and no injury noted at the time of admission.  We will get plain films and a duplex of that left leg just to rule out DVT; however, my suspicion is more towards a gout attack, but will further assess with work up.  Letha Cape 3:07 PM 07/30/2020

## 2020-07-30 NOTE — Progress Notes (Signed)
Lower extremity venous LT study completed.  Preliminary results relayed to [place], [place].   See CV Proc for preliminary results report.   Jean Rosenthal, RDMS, RVT

## 2020-07-30 NOTE — Progress Notes (Signed)
Inpatient Rehab Admissions Coordinator:  Saw pt and son at bedside. Informed that pt not medically ready to be admitted to CIR.  Will continue to follow.   Wolfgang Phoenix, MS, CCC-SLP Admissions Coordinator [phone]

## 2020-07-30 NOTE — Care Management Important Message (Signed)
Important Message  Patient Details  Name: Danielle Kirby MRN: [id number] Date of Birth: 23-Sep-1945   Medicare Important Message Given:  Yes     Dorena Bodo 07/30/2020, 3:32 PM

## 2020-07-30 NOTE — Progress Notes (Addendum)
   Subjective/Chief Complaint: No complaints this morning. No events overnight. Tolerating breakfast. Denies pain. Family member at bedside.   Objective: Vital signs in last 24 hours: Temp:  [97.3 F (36.3 C)-99.4 F (37.4 C)] 99.4 F (37.4 C) (04/25 0804) Pulse Rate:  [89-107] 101 (04/25 0804) Resp:  [18-23] 23 (04/25 0804) BP: (139-172)/(66-103) 142/80 (04/25 0804) SpO2:  [97 %-100 %] 97 % (04/25 0804) Last BM Date:  (PTA)  Intake/Output from previous day: 04/24 0701 - 04/25 0700 In: 10 [I.V.:10] Out: 150 [Urine:150] Intake/Output this shift: No intake/output data recorded.  Exam: Awake Follows commands CV RRR Resp clear, non labored Abdomen non-tender, +BS  Lab Results:  Recent Labs    07/28/20 0318  WBC 5.7  HGB 9.7*  HCT 30.8*  PLT 156   BMET Recent Labs    07/28/20 0318  NA 139  K 3.2*  CL 108  CO2 24  GLUCOSE 106*  BUN 11  CREATININE 0.64  CALCIUM 9.1   PT/INR No results for input(s): LABPROT, INR in the last 72 hours. ABG No results for input(s): PHART, HCO3 in the last 72 hours.  Invalid input(s): PCO2, PO2  Studies/Results: No results found.  Anti-infectives: Anti-infectives (From admission, onward)   None      Assessment/Plan: . 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 Lorraine Lax sugar tong splint but patient keeps removing, Dr. 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 Conchita Paris, no follow up scan needed. Keppra x7d R maxillary sinus, R lateral orbital wall and floor fxs with fat herniation - Dr. Annette Stable seen. No acute surgical intervention. May follow up prn. Recommends outpatient ophthalmology exam Elevated Cr - normalized Abnormal appearing endometrium -can f/u with Gyn as o/p Hx heart murmur Memory deficit- unclear if this is new  or not. 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 Outpatient follow up L supraclinoid ICA aneurysm- incidental find, can follow up with Dr. Conchita Paris as outpatient. VTE - SCDs, LMWH FEN -D3 diet per speech, hypokalemia (3.2 on 4/23), repeat BMP today Foley -none Follow up -TBD Dispo - 4NP, Rehab is seeing the patient, start norvasc 5 mg PO daily for persistent HTN.   Continue working with PT    LOS: 4 days    Adam Phenix 07/30/2020

## 2020-07-31 MED ORDER — LEVETIRACETAM 500 MG PO TABS
500.0000 mg | ORAL_TABLET | Freq: Two times a day (BID) | ORAL | Status: DC
  Administered 2020-07-31 – 2020-08-02 (×4): 500 mg via ORAL
  Filled 2020-07-31 (×4): qty 1

## 2020-07-31 MED ORDER — NAPROXEN 250 MG PO TABS
250.0000 mg | ORAL_TABLET | Freq: Two times a day (BID) | ORAL | Status: DC
  Administered 2020-07-31 – 2020-08-02 (×5): 250 mg via ORAL
  Filled 2020-07-31 (×5): qty 1

## 2020-07-31 NOTE — Progress Notes (Signed)
Inpatient Rehab Admissions Coordinator:   I have no beds available for this patient to admit to CIR today.  Will continue to follow for timing of potential admission pending bed availability.   Estill Dooms, PT, DPT Admissions Coordinator [phone] 07/31/20  3:09 PM

## 2020-07-31 NOTE — Progress Notes (Signed)
Physical Therapy Treatment Patient Details Name: Danielle Kirby MRN: [id number] DOB: July 30, 1945 Today's Date: 07/31/2020    History of Present Illness This 75 y.o. female admitted 4/21 after multiple falls.  First fall was several days PTA where she fell down 4-6 stairs.  Then fell fell in the bathroom and was found by significant other unresponsive.  CT of brain small SDH or hygroma with suspected hemorrhage with ~41mm Right midline shift; Rt periorbital and pre maxillary soft tissue contusion.  Fx of all walls of the Rt maxillary sinus with layering hemosinus.  Additional fractures of the Rt lateral orbital wall and orbital floor with herniation of small amount of fat throgh orbital floor.  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  She was also found to have Rt wrist/elbow fx (NWB Rt UE).  PMH includes: cataracts, arthritis    PT Comments    Initiated session with warm up exercises for left lower extremity ROM and strengthening. Pt continues to be limited in initiating sitting up on the side of the bed or standing due to left knee pain. Pt requiring two person maximal assist for bed mobility. Unable to stand despite multiple attempts using ambulation equipment Corene Cornea with LUE pulling up). Pt not initiating hip extension whatsoever. Continue to recommend comprehensive inpatient rehab (CIR) for post-acute therapy needs.     Follow Up Recommendations  CIR;Supervision/Assistance - 24 hour     Equipment Recommendations  Wheelchair (measurements PT);Wheelchair cushion (measurements PT);Hospital bed;Other (comment) (hoyer lift)    Recommendations for Other Services       Precautions / Restrictions Precautions Precautions: Fall Precaution Comments: RUE sling and sugar tong splint Required Braces or Orthoses: Sling Restrictions Weight Bearing Restrictions: Yes RUE Weight Bearing: Non weight bearing    Mobility  Bed Mobility Overal bed mobility: Needs Assistance Bed  Mobility: Supine to Sit;Sit to Supine     Supine to sit: Max assist;+2 for physical assistance Sit to supine: Max assist;+2 for physical assistance   General bed mobility comments: MaxA + 2 for initiationg and execution of supine <> sit    Transfers Overall transfer level: Needs assistance Equipment used: Ambulation equipment used             General transfer comment: Unable to initiate to stand to [place] with totalA + 2  Ambulation/Gait                 Stairs             Wheelchair Mobility    Modified Rankin (Stroke Patients Only)       Balance Overall balance assessment: Needs assistance Sitting-balance support: Feet supported Sitting balance-Leahy Scale: Good                                      Cognition Arousal/Alertness: Awake/alert Behavior During Therapy: Flat affect Overall Cognitive Status: Impaired/Different from baseline Area of Impairment: Orientation;Attention;Memory;Following commands;Safety/judgement;Awareness;Problem solving               Rancho Levels of Cognitive Functioning [place] Amigos Scales of Cognitive Functioning: Confused/inappropriate/non-agitated Orientation Level: Disoriented to;Time;Situation;Place Current Attention Level: Sustained Memory: Decreased short-term memory Following Commands: Follows one step commands inconsistently Safety/Judgement: Decreased awareness of deficits;Decreased awareness of safety Awareness: Intellectual Problem Solving: Decreased initiation;Difficulty sequencing;Requires verbal cues General Comments: Pt able to sustain attention and follow commands to perform therapeutic exercises, but has difficulty with initiating activities such as bed  mobility and standing from the edge of bed. Pt fixated on her books, asking how old they were repeatedly. Pt asking what physical therapy was for and "if it was going to make her limber."      Exercises General Exercises - Lower  Extremity Ankle Circles/Pumps: AROM;Left;10 reps;Supine Quad Sets: AROM;Left;10 reps;Supine Heel Slides: AAROM;Left;5 reps;Seated    General Comments        Pertinent Vitals/Pain Pain Assessment: Faces Faces Pain Scale: Hurts whole lot Pain Location: Anytime L knee moved pt grimaced and vocalized pain. Pain Descriptors / Indicators: Grimacing;Guarding Pain Intervention(s): Limited activity within patient's tolerance;Monitored during session;Repositioned    Home Living                      Prior Function            PT Goals (current goals can now be found in the care plan section) Acute Rehab PT Goals Patient Stated Goal: none stated Potential to Achieve Goals: Good    Frequency    Min 4X/week      PT Plan Current plan remains appropriate    Co-evaluation              AM-PAC PT "6 Clicks" Mobility   Outcome Measure  Help needed turning from your back to your side while in a flat bed without using bedrails?: Total Help needed moving from lying on your back to sitting on the side of a flat bed without using bedrails?: Total Help needed moving to and from a bed to a chair (including a wheelchair)?: Total Help needed standing up from a chair using your arms (e.g., wheelchair or bedside chair)?: Total Help needed to walk in hospital room?: Total Help needed climbing 3-5 steps with a railing? : Total 6 Click Score: 6    End of Session   Activity Tolerance: Patient limited by pain Patient left: in bed;with bed alarm set;with call bell/phone within reach Nurse Communication: Mobility status PT Visit Diagnosis: Unsteadiness on feet (R26.81);Other abnormalities of gait and mobility (R26.89);Muscle weakness (generalized) (M62.81);Repeated falls (R29.6);History of falling (Z91.81);Difficulty in walking, not elsewhere classified (R26.2);Other symptoms and signs involving the nervous system (R29.898)     Time: 7353-2992 PT Time Calculation (min) (ACUTE  ONLY): 28 min  Charges:  $Therapeutic Activity: 23-37 mins                     Lillia Pauls, PT, DPT Acute Rehabilitation Services Pager [phone] Office [phone]    Norval Morton 07/31/2020, 4:49 PM

## 2020-07-31 NOTE — Progress Notes (Signed)
Occupational Therapy Treatment Patient Details Name: Danielle Kirby MRN: [id number] DOB: [birth year] Today's Date: 07/31/2020    History of present illness This 75 y.o. female admitted 4/21 after multiple falls.  First fall was several days PTA where she fell down 4-6 stairs.  Then fell fell in the bathroom and was found by significant other unresponsive.  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  Fx of all walls of the Rt maxillary sinus with layering hemosinus.  Additional fractures of the Rt lateral orbital wall and orbital floor with herniation of small amount of fat throgh orbital floor.  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  She was also found to have Rt wrist/elbow fx (NWB Rt UE).  PMH includes: cataracts, arthritis   OT comments  Pt making slow progress. Pt very lethargic today and unable to keep eyes open despite stimulation.  Per nursing, pt did not sleep well last night and was active all night.  Pt followed a few simple commands but not oriented to anything other than self.  Will attempt back when pt more alert.    Follow Up Recommendations  CIR    Equipment Recommendations  None recommended by OT    Recommendations for Other Services      Precautions / Restrictions Precautions Precautions: Fall Precaution Comments: RUE sling and sugar tong splint Required Braces or Orthoses: Sling Restrictions Weight Bearing Restrictions: Yes RUE Weight Bearing: Non weight bearing       Mobility Bed Mobility Overal bed mobility: Needs Assistance Bed Mobility: Rolling Rolling: Max assist         General bed mobility comments: Pt requiring max to total assist due to lethargy and sleepiness.    Transfers Overall transfer level: Needs assistance Equipment used: 2 person hand held assist             General transfer comment: Unable to assess transfers  today with pt so lethargic and not keeping eyes open during session.    Balance                                           ADL either performed or assessed with clinical judgement   ADL Overall ADL's : Needs assistance/impaired     Grooming: Wash/dry hands;Wash/dry face;Oral care;Maximal assistance;Sitting Grooming Details (indicate cue type and reason): Pt very lethargic requiring hand over hand assist using LUE to wash face and complete mouth care.                             Functional mobility during ADLs: Maximal assistance;+2 for physical assistance;+2 for safety/equipment General ADL Comments: Pt max assist with most adls today. Pt very lethargic and not following many commands today. Nursing states pt did not sleep well.     Vision   Vision Assessment?: Vision impaired- to be further tested in functional context   Perception     Praxis      Cognition Arousal/Alertness: Lethargic Behavior During Therapy: Flat affect Overall Cognitive Status: Impaired/Different from baseline Area of Impairment: Orientation;Attention;Memory;Following commands;Safety/judgement;Awareness;Problem solving               Rancho Levels of Cognitive Functioning Rancho Los Amigos Scales of Cognitive Functioning: Confused/inappropriate/non-agitated Orientation Level: Disoriented to;Time;Situation;Place Current Attention Level: Focused  Memory: Decreased short-term memory Following Commands: Follows one step commands inconsistently Safety/Judgement: Decreased awareness of deficits;Decreased awareness of safety Awareness: Intellectual Problem Solving: Slow processing;Decreased initiation General Comments: Pt very lethargic today and not staying awake during therapy. Pt opened eyes on one occasion but could not keep eyes open. Spoke with nursing who stated pt was up a good bit of the night so is now lethargic.        Exercises Other Exercises Other Exercises:  Functional reaching with LUE in all planes   Shoulder Instructions       General Comments Pt limited by lethargy today and pain in LLE. Spoke with nursing about DVT and no anticoagulation.  Nursing stated there are no bedrest orders.    Pertinent Vitals/ Pain       Pain Assessment: Faces Faces Pain Scale: Hurts whole lot Pain Location: Anytime L knee moved pt grimaced and vocalized pain. Pain Descriptors / Indicators: Grimacing;Guarding Pain Intervention(s): Limited activity within patient's tolerance;Monitored during session;Repositioned  Home Living                                          Prior Functioning/Environment              Frequency  Min 2X/week        Progress Toward Goals  OT Goals(current goals can now be found in the care plan section)  Progress towards OT goals: Not progressing toward goals - comment (lethargic today)  Acute Rehab OT Goals Patient Stated Goal: none stated OT Goal Formulation: With patient/family Time For Goal Achievement: 08/10/20 Potential to Achieve Goals: Good ADL Goals Pt Will Perform Upper Body Bathing: with min assist;sitting Pt Will Perform Lower Body Bathing: with mod assist;sit to/from stand Pt Will Perform Upper Body Dressing: with mod assist;sitting Pt Will Perform Lower Body Dressing: with mod assist;sit to/from stand Pt Will Transfer to Toilet: with min assist;stand pivot transfer;bedside commode Pt Will Perform Toileting - Clothing Manipulation and hygiene: with mod assist;sit to/from stand Additional ADL Goal #1: Pt will be oriented x 4 with min cues and external cues Additional ADL Goal #2: Pt will sustain attention to familiar ADL x 8 mins with min cues  Plan Discharge plan remains appropriate    Co-evaluation                 AM-PAC OT "6 Clicks" Daily Activity     Outcome Measure   Help from another person eating meals?: A Little Help from another person taking care of personal  grooming?: A Lot Help from another person toileting, which includes using toliet, bedpan, or urinal?: A Lot Help from another person bathing (including washing, rinsing, drying)?: A Lot Help from another person to put on and taking off regular upper body clothing?: A Lot Help from another person to put on and taking off regular lower body clothing?: Total 6 Click Score: 12    End of Session    OT Visit Diagnosis: Unsteadiness on feet (R26.81);Cognitive communication deficit (R41.841)   Activity Tolerance Patient limited by lethargy   Patient Left in bed;with call bell/phone within reach;with chair alarm set;with family/visitor present   Nurse Communication Mobility status        Time: 1100-1120 OT Time Calculation (min): 20 min  Charges: OT General Charges $OT Visit: 1 Visit OT Treatments $Self Care/Home Management : 8-22 mins   Hope Budds  07/31/2020, 11:35 AM

## 2020-07-31 NOTE — Progress Notes (Signed)
Subjective/Chief Complaint: No complaints this morning. Answering questions and following commands. Oriented to self only.  Objective: Vital signs in last 24 hours: Temp:  [97.9 F (36.6 C)-99.9 F (37.7 C)] 99.6 F (37.6 C) (04/26 0915) Pulse Rate:  [89-104] 100 (04/26 0915) Resp:  [17-23] 20 (04/26 0842) BP: (138-177)/(66-96) 138/66 (04/26 0915) SpO2:  [93 %-100 %] 97 % (04/26 0842) Last BM Date:  (PTA)  Intake/Output from previous day: 04/25 0701 - 04/26 0700 In: -  Out: 450 [Urine:450] Intake/Output this shift: No intake/output data recorded.  Exam: Awake Follows commands CV RRR Resp clear, non labored Abdomen non-tender, +BS LLE swelling present, pain with passion ROM L knee.   BMET Recent Labs    07/30/20 0944  NA 135  K 3.8  CL 99  CO2 26  GLUCOSE 116*  BUN 7*  CREATININE 0.65  CALCIUM 9.5   PT/INR No results for input(s): LABPROT, INR in the last 72 hours. ABG No results for input(s): PHART, HCO3 in the last 72 hours.  Invalid input(s): PCO2, PO2  Studies/Results: DG Knee Complete 4 Views Left  Result Date: 07/30/2020 CLINICAL DATA:  Left knee pain.  Multiple falls. EXAM: LEFT KNEE - COMPLETE 4+ VIEW COMPARISON:  None. FINDINGS: No evidence of fracture or dislocation. Mild medial compartment joint space narrowing. Mild tricompartmental peripheral spurring. Chondrocalcinosis. Small to moderate joint effusion. Soft tissues are unremarkable. IMPRESSION: 1. Mild tricompartmental osteoarthritis and chondrocalcinosis. 2. Small to moderate joint effusion. 3. No fracture. Electronically Signed   By: Narda Rutherford M.D.   On: 07/30/2020 17:53   VAS [place] LOWER EXTREMITY VENOUS (DVT)  Result Date: 07/31/2020  Lower Venous DVT Study Patient Name:  Danielle Kirby  Date of Exam:   07/30/2020 Medical Rec #: [id number]        Accession #:    [id number] Date of Birth: 08/22/1945         Patient Gender: F Patient Age:   15Y Exam Location:  [facility]  Procedure:      VAS [place] LOWER EXTREMITY VENOUS (DVT) Referring Phys: [id number] KELLY OSBORNE --------------------------------------------------------------------------------  Indications: New swelling RT knee. Other Indications: S/P fall 07-26-2020. Limitations: Patient unable to cooperate with repositioning. Comparison Study: 07-31-2017 Prior LT lower extremity venous was negative for                   DVT. 06-04-2020 Prior RT lower extremity venous study. Performing Technologist: Jean Rosenthal RDMS,RVT  Examination Guidelines: A complete evaluation includes B-mode imaging, spectral Doppler, color Doppler, and power Doppler as needed of all accessible portions of each vessel. Bilateral testing is considered an integral part of a complete examination. Limited examinations for reoccurring indications may be performed as noted. The reflux portion of the exam is performed with the patient in reverse Trendelenburg.  +-----+---------------+---------+-----------+----------+--------------+ RIGHTCompressibilityPhasicitySpontaneityPropertiesThrombus Aging +-----+---------------+---------+-----------+----------+--------------+ CFV  Full           Yes      Yes                                 +-----+---------------+---------+-----------+----------+--------------+   +---------+---------------+---------+-----------+----------+-----------------+ LEFT     CompressibilityPhasicitySpontaneityPropertiesThrombus Aging    +---------+---------------+---------+-----------+----------+-----------------+ CFV      Full           Yes      Yes                                    +---------+---------------+---------+-----------+----------+-----------------+  SFJ      Full                                                           +---------+---------------+---------+-----------+----------+-----------------+ FV Prox  Full                                                            +---------+---------------+---------+-----------+----------+-----------------+ FV Mid   Full                                                           +---------+---------------+---------+-----------+----------+-----------------+ FV DistalFull                                                           +---------+---------------+---------+-----------+----------+-----------------+ PFV      Full                                                           +---------+---------------+---------+-----------+----------+-----------------+ POP      Full           Yes      Yes                                    +---------+---------------+---------+-----------+----------+-----------------+ PTV      Full                                                           +---------+---------------+---------+-----------+----------+-----------------+ PERO     Partial        Yes      Yes                  Age Indeterminate +---------+---------------+---------+-----------+----------+-----------------+     Summary: RIGHT: - No evidence of common femoral vein obstruction.  LEFT: - Findings consistent with age indeterminate deep vein thrombosis involving the left peroneal veins. - A cystic structure measuring 4.9 cm is found in the popliteal fossa.  *See table(s) above for measurements and observations. Electronically signed by Waverly Ferrari MD on 07/31/2020 at 7:31:22 AM.    Final     Anti-infectives: Anti-infectives (From admission, onward)   None      Assessment/Plan: . 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 Lorraine Lax sugar tong splint but patient keeps removing, Dr. 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 Conchita Paris, no follow up scan needed. Keppra x7d R maxillary sinus, R lateral orbital wall and  floor fxs with fat herniation - Dr. Annette Stable seen. No acute surgical intervention. May follow up prn. Recommends outpatient ophthalmology exam Elevated Cr - normalized Abnormal appearing endometrium -can f/u with Gyn as o/p Hx heart murmur Memory deficit- unclear if this is new or not. 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 Outpatient follow up L supraclinoid ICA aneurysm- incidental find, can follow up with Dr. Conchita Paris as outpatient. 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  VTE - SCDs, LMWH FEN -D3 diet per speech, hypokalemia (3.2 on 4/23), repeat BMP today Foley -none Follow up -TBD Dispo - 4NP, Rehab is following the patient, will discuss starting celebrex for L knee with MD. I spoke with the patients significant other, Mr. Paris Lore, on the phone and updated him on the patients diagnosis of DVT and our current recommendation not to start anticoagulation. Regarding knee swelling, he states she doesn't have a known history of gout but did have some toe discomfort/swelling not long ago.      LOS: 5 days    Adam Phenix 07/31/2020

## 2020-08-01 LAB — CBC
HCT: 34.8 % — ABNORMAL LOW (ref 36.0–46.0)
Hemoglobin: 11.2 g/dL — ABNORMAL LOW (ref 12.0–15.0)
MCH: 27.7 pg (ref 26.0–34.0)
MCHC: 32.2 g/dL (ref 30.0–36.0)
MCV: 86.1 fL (ref 80.0–100.0)
Platelets: 257 10*3/uL (ref 150–400)
RBC: 4.04 MIL/uL (ref 3.87–5.11)
RDW: 14.2 % (ref 11.5–15.5)
WBC: 6.4 10*3/uL (ref 4.0–10.5)
nRBC: 0 % (ref 0.0–0.2)

## 2020-08-01 NOTE — Progress Notes (Signed)
Subjective/Chief Complaint: No complaints this morning. Answering questions and following commands. Oriented to self only. Denies pain.  Objective: Vital signs in last 24 hours: Temp:  [97.9 F (36.6 C)-99.7 F (37.6 C)] 97.9 F (36.6 C) (04/27 0700) Pulse Rate:  [90-101] 96 (04/27 0419) Resp:  [15-18] 15 (04/27 0419) BP: (141-167)/(61-88) 156/88 (04/27 0700) SpO2:  [97 %-99 %] 97 % (04/27 0419) Last BM Date:  (PTA)  Intake/Output from previous day: 04/26 0701 - 04/27 0700 In: -  Out: 450 [Urine:450] Intake/Output this shift: No intake/output data recorded.  Exam: Awake Follows commands CV RRR Resp clear, non labored Abdomen non-tender, +BS L knee swelling present, pain with passive ROM L knee.   BMET Recent Labs    07/30/20 0944  NA 135  K 3.8  CL 99  CO2 26  GLUCOSE 116*  BUN 7*  CREATININE 0.65  CALCIUM 9.5   PT/INR No results for input(s): LABPROT, INR in the last 72 hours. ABG No results for input(s): PHART, HCO3 in the last 72 hours.  Invalid input(s): PCO2, PO2  Studies/Results: DG Knee Complete 4 Views Left  Result Date: 07/30/2020 CLINICAL DATA:  Left knee pain.  Multiple falls. EXAM: LEFT KNEE - COMPLETE 4+ VIEW COMPARISON:  None. FINDINGS: No evidence of fracture or dislocation. Mild medial compartment joint space narrowing. Mild tricompartmental peripheral spurring. Chondrocalcinosis. Small to moderate joint effusion. Soft tissues are unremarkable. IMPRESSION: 1. Mild tricompartmental osteoarthritis and chondrocalcinosis. 2. Small to moderate joint effusion. 3. No fracture. Electronically Signed   By: Narda Rutherford M.D.   On: 07/30/2020 17:53   VAS [place] LOWER EXTREMITY VENOUS (DVT)  Result Date: 07/31/2020  Lower Venous DVT Study Patient Name:  Danielle Kirby  Date of Exam:   07/30/2020 Medical Rec #: [id number]        Accession #:    [id number] Date of Birth: [birth year]         Patient Gender: F Patient Age:   61Y Exam Location:  [facility] Procedure:      VAS [place] LOWER EXTREMITY VENOUS (DVT) Referring Phys: [id number] KELLY OSBORNE --------------------------------------------------------------------------------  Indications: New swelling RT knee. Other Indications: S/P fall 07-26-2020. Limitations: Patient unable to cooperate with repositioning. Comparison Study: 07-31-2017 Prior LT lower extremity venous was negative for                   DVT. 06-04-2020 Prior RT lower extremity venous study. Performing Technologist: Jean Rosenthal RDMS,RVT  Examination Guidelines: A complete evaluation includes B-mode imaging, spectral Doppler, color Doppler, and power Doppler as needed of all accessible portions of each vessel. Bilateral testing is considered an integral part of a complete examination. Limited examinations for reoccurring indications may be performed as noted. The reflux portion of the exam is performed with the patient in reverse Trendelenburg.  +-----+---------------+---------+-----------+----------+--------------+ RIGHTCompressibilityPhasicitySpontaneityPropertiesThrombus Aging +-----+---------------+---------+-----------+----------+--------------+ CFV  Full           Yes      Yes                                 +-----+---------------+---------+-----------+----------+--------------+   +---------+---------------+---------+-----------+----------+-----------------+ LEFT     CompressibilityPhasicitySpontaneityPropertiesThrombus Aging    +---------+---------------+---------+-----------+----------+-----------------+ CFV      Full           Yes      Yes                                    +---------+---------------+---------+-----------+----------+-----------------+  SFJ      Full                                                           +---------+---------------+---------+-----------+----------+-----------------+ FV Prox  Full                                                            +---------+---------------+---------+-----------+----------+-----------------+ FV Mid   Full                                                           +---------+---------------+---------+-----------+----------+-----------------+ FV DistalFull                                                           +---------+---------------+---------+-----------+----------+-----------------+ PFV      Full                                                           +---------+---------------+---------+-----------+----------+-----------------+ POP      Full           Yes      Yes                                    +---------+---------------+---------+-----------+----------+-----------------+ PTV      Full                                                           +---------+---------------+---------+-----------+----------+-----------------+ PERO     Partial        Yes      Yes                  Age Indeterminate +---------+---------------+---------+-----------+----------+-----------------+     Summary: RIGHT: - No evidence of common femoral vein obstruction.  LEFT: - Findings consistent with age indeterminate deep vein thrombosis involving the left peroneal veins. - A cystic structure measuring 4.9 cm is found in the popliteal fossa.  *See table(s) above for measurements and observations. Electronically signed by Waverly Ferrari MD on 07/31/2020 at 7:31:22 AM.    Final     Anti-infectives: Anti-infectives (From admission, onward)   None      Assessment/Plan: . 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 Lorraine Lax sugar tong splint but patient keeps removing, Dr. 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 Conchita Paris, no follow up scan needed. Keppra x7d R maxillary sinus, R lateral orbital wall and  floor fxs with fat herniation - Dr. Annette Stable seen. No acute surgical intervention. May follow up prn. Recommends outpatient ophthalmology exam Elevated Cr - normalized Abnormal appearing endometrium -can f/u with Gyn as o/p Hx heart murmur Memory deficit- unclear if this is new or not. 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 Outpatient follow up L supraclinoid ICA aneurysm- incidental find, can follow up with Dr. Conchita Paris as outpatient. 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 L knee effusion - arthritis vs gout, continue ice and naproxen BID. L knee bakers cyst   VTE - SCDs, LMWH FEN -D3 diet per speech Foley -none Follow up -TBD Dispo - 4NP, continue naproxen and ice for L knee effusion/pain, medically stable for discharge to CIR.     LOS: 6 days    Adam Phenix 08/01/2020

## 2020-08-01 NOTE — Progress Notes (Signed)
Restraints not present upon shift change.

## 2020-08-01 NOTE — Progress Notes (Signed)
Inpatient Rehab Admissions Coordinator:   I have no beds available for this patient to admit to CIR today.  Will continue to follow for timing of potential admission pending bed availability.   Estill Dooms, PT, DPT Admissions Coordinator [phone] 08/01/20  10:09 AM

## 2020-08-01 NOTE — H&P (Signed)
Physical Medicine and Rehabilitation Admission H&P     HPI: Danielle Kirby is a 75 year old right-handed female with unremarkable past medical history except cataracts and osteoarthritis.  Per chart review lives with significant other.  Independent prior to admission and active.  Two-level home 6 steps to entry.  Presented 07/26/2020 after reports of multiple falls.  The first fall was several days prior to admission where she fell down 4-6 stairs she then  fell again in the bathroom and found by significant other unresponsive.  Admission chemistries unremarkable except potassium 3.3 creatinine 1.08 alcohol negative.  Cranial CT scan showed a small right thalamocapsular hypodensity possibly representing age-indeterminate lacunar infarction.  Small 3 mm largely low-density left cerebral convexity subdural collection suspicious for subdural hematoma or hygroma likely in part chronic.  Right periorbital and premaxillary soft tissue contusion.  Fractures of walls of the right maxillary sinus with layering hemosinus.  Additional fractures of the right lateral orbital wall and orbital floor with herniation small amount of fat through the orbital floor defect and involvement of the infraorbital foramen.  CT angiogram head and neck no emergent large vessel occlusion or hemodynamically significant proximal stenosis intracranially.  Approximately 50% stenosis of the proximal left ICA secondary to predominantly calcified atherosclerosis.  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  MRI of the brain showed no acute infarction.  Numerous small foci of susceptibility artifact within the posterior right temporal and right parietal occipital region without associated edema or hyperdensity compatible with prior microhemorrhages.  MRI lumbar spine showed mild multilevel degenerative change as well as incidental abnormal appearance of the endometrium partially imaged  but suspicious for possible cancer and patient was to follow-up outpatient OB/GYN.  CT of the right wrist/elbow showed comminuted mildly impacted right intra-articular fracture of the distal radius, minimally displaced ulnar styloid fracture.  Follow-up neurosurgery regards to small amount of subdural blood as well as incidental left supraclinoid ICA aneurysm follow-up outpatient Dr. Conchita Paris.  Dr. Merlyn Lot follow-up for comminuted distal radius fracture minimally displaced radial head fracture recommend sugar-tong splint follow-up in 1 week there was some discussion of possible ORIF of distal radius.  Follow-up Dr. Leta Baptist in regards to facial fractures nonoperative follow-up outpatient as well as recommendations of ophthalmology eye exam.  Therapy evaluations completed right upper extremity nonweightbearing, right upper extremity sling with a sugar-tong splint.  Venous Doppler studies 07/30/2020 showed age-indeterminate DVT left peroneal vein.  Given recurrent falls/SDH no current recommendations for anticoagulation advised follow-up vascular study.  She was maintained on subcutaneous Lovenox..  Maintained on Keppra for seizure prophylaxis.  Patient did have episodes of agitation restlessness requiring Haldol.  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  HENT: Negative for hearing loss.   Eyes: Negative for blurred vision and double vision.  Respiratory: Negative for cough and shortness of breath.   Cardiovascular: Positive for leg swelling. Negative for chest pain and palpitations.  Gastrointestinal: Positive for constipation. Negative for heartburn, nausea and vomiting.  Genitourinary: Negative for dysuria, flank pain and hematuria.  Musculoskeletal: Positive for falls, joint pain and myalgias.  Skin: Negative for rash.  Neurological: Positive for loss of  consciousness.  All other systems reviewed and are negative.  Past Medical History:  Diagnosis Date  . Allergy   . Arthritis   . Cataract   . Heart murmur  Past Surgical History:  Procedure Laterality Date  . BREAST SURGERY    . COSMETIC SURGERY    . EYE SURGERY    . TUBAL LIGATION     Family History  Problem Relation Age of Onset  . Diabetes Mother   . Hypertension Father    Social History:  reports that she has never smoked. She has never used smokeless tobacco. She reports that she does not drink alcohol and does not use drugs. Allergies:  Allergies  Allergen Reactions  . Penicillins     REACTION: hives   Medications Prior to Admission  Medication Sig Dispense Refill  . OVER THE COUNTER MEDICATION daily.    . methocarbamol (ROBAXIN) 500 MG tablet Take 1 tablet (500 mg total) by mouth 2 (two) times daily. (Patient not taking: No sig reported) 20 tablet 0    Drug Regimen Review Drug regimen was reviewed and remains appropriate with no significant issues identified  Home: Home Living Family/patient expects to be discharged to:: Private residence Living Arrangements: Spouse/significant other Available Help at Discharge: Family,Available 24 hours/day Type of Home: House Home Access: Stairs to enter [employer] of Steps: 6-7 Entrance Stairs-Rails: Right,Left,Can reach both Home Layout: Other (Comment) (split level house; basement on lower level/bedroom on upper level) Alternate Level Stairs-Number of Steps: 2 steps Bathroom Shower/Tub: Engineer, manufacturing systems: Standard Bathroom Accessibility: Yes Home Equipment: None Additional Comments: Pt lives with her significant other  Lives With: Significant other   Functional History: Prior Function Level of Independence: Independent Comments: Per pt's significant other, Pt was fully independent.  Drives. cleans, managed own finances and medications.  She worked for the [employer] for 30  years  Functional Status:  Mobility: Bed Mobility Overal bed mobility: Needs Assistance Bed Mobility: Supine to Sit,Sit to Supine Rolling: Max assist Supine to sit: Max assist,+2 for physical assistance Sit to supine: Max assist,+2 for physical assistance General bed mobility comments: MaxA + 2 for initiationg and execution of supine <> sit Transfers Overall transfer level: Needs assistance Equipment used: Ambulation equipment used Transfers: Sit to/from Stand Sit to Stand: +2 physical assistance,Max assist Stand pivot transfers: Mod assist,+2 physical assistance,+2 safety/equipment General transfer comment: Unable to initiate to stand to [facility] with totalA + 2 Ambulation/Gait Ambulation/Gait assistance: Mod assist,+2 physical assistance,+2 safety/equipment Gait Distance (Feet): 4 Feet Assistive device: 2 person hand held assist Gait Pattern/deviations: Decreased step length - right,Decreased step length - left,Decreased stride length,Shuffle,Decreased weight shift to left,Decreased stance time - left,Trunk flexed General Gait Details: Pt with UE support with therapists on either side of her, cuing pt through visual, verbal, and tactile cues to take steps to L at EOB to recliner. Pt needing modAx2 to prevent her R lateral lean, facilitate weight shifting to L, and facilitate bil leg advancement. L knee buckling noted, needing intermittent blocking. Gait velocity: reduced Gait velocity interpretation: <1.31 ft/sec, indicative of household ambulator    ADL: ADL Overall ADL's : Needs assistance/impaired Eating/Feeding: Minimal assistance,Sitting Grooming: Wash/dry hands,Wash/dry face,Oral care,Maximal assistance,Sitting Grooming Details (indicate cue type and reason): Pt very lethargic requiring hand over hand assist using LUE to wash face and complete mouth care. Upper Body Bathing: Moderate assistance,Sitting Lower Body Bathing: Sit to/from stand,Maximal assistance Upper Body  Dressing : Maximal assistance,Sitting Lower Body Dressing: Maximal assistance,Sit to/from stand Toilet Transfer: Moderate assistance,+2 for physical assistance,+2 for safety/equipment,Stand-pivot,BSC Toileting- Clothing Manipulation and Hygiene: Total assistance,Sit to/from stand Functional mobility during ADLs: Maximal assistance,+2 for physical assistance,+2 for safety/equipment General ADL Comments: Pt max  assist with most adls today. Pt very lethargic and not following many commands today. Nursing states pt did not sleep well.  Cognition: Cognition Overall Cognitive Status: Impaired/Different from baseline Arousal/Alertness: Lethargic Orientation Level: Oriented to person Attention: Focused Focused Attention: Impaired Focused Attention Impairment: Verbal basic,Functional basic Memory: Impaired Memory Impairment: Other (comment) (unable to assess secondary to very poor attention) Awareness: Impaired Awareness Impairment: Intellectual impairment Problem Solving: Impaired Problem Solving Impairment: Verbal basic,Functional basic Behaviors: Restless Safety/Judgment: Impaired [place] [employer] Scales of Cognitive Functioning: Confused/inappropriate/non-agitated Cognition Arousal/Alertness: Awake/alert Behavior During Therapy: Flat affect Overall Cognitive Status: Impaired/Different from baseline Area of Impairment: Orientation,Attention,Memory,Following commands,Safety/judgement,Awareness,Problem solving Orientation Level: Disoriented to,Time,Situation,Place Current Attention Level: Sustained Memory: Decreased short-term memory Following Commands: Follows one step commands inconsistently Safety/Judgement: Decreased awareness of deficits,Decreased awareness of safety Awareness: Intellectual Problem Solving: Decreased initiation,Difficulty sequencing,Requires verbal cues General Comments: Pt able to sustain attention and follow commands to perform therapeutic exercises, but has  difficulty with initiating activities such as bed mobility and standing from the edge of bed. Pt fixated on her books, asking how old they were repeatedly. Pt asking what physical therapy was for and "if it was going to make her limber."  Physical Exam: Blood pressure (!) 148/78, pulse 96, temperature 99.2 F (37.3 C), temperature source Oral, resp. rate 15, height 5\' 7"  (1.702 m), weight 83.6 kg, SpO2 97 %. Physical Exam Constitutional:      General: She is not in acute distress. HENT:     Head: Normocephalic.     Nose: Nose normal.     Mouth/Throat:     Mouth: Mucous membranes are moist.     Pharynx: Oropharynx is clear.     Comments: dentures Eyes:     Extraocular Movements: Extraocular movements intact.     Pupils: Pupils are equal, round, and reactive to light.  Cardiovascular:     Rate and Rhythm: Normal rate and regular rhythm.     Heart sounds: No murmur heard. No gallop.   Pulmonary:     Effort: Pulmonary effort is normal. No respiratory distress.     Breath sounds: No stridor. No wheezing or rhonchi.  Abdominal:     General: Bowel sounds are normal. There is no distension.     Palpations: Abdomen is soft.     Tenderness: There is no abdominal tenderness.  Musculoskeletal:     Cervical back: Neck supple.     Comments: Right wrist with splint. Some swelling in digits of right hand. Hand NVI  Skin:    General: Skin is warm.  Neurological:     Mental Status: She is alert.     Comments: Patient is alert in no acute distress.  Makes eye contact with examiner.  Oriented to self and [place]. Did not know why she was here. Limited insight and awareness. Occasionally confabulated. Speech generally clear. RUE limited by splint/ortho, able to grip with fingers. LUE 4/5. RLE 3/5 prox to 4/5 distal. LLE 2/5 prox to 4/5 ADF/PF. Senses pai in all 4's  Psychiatric:     Comments: Flat but cooperative     Results for orders placed or performed during the hospital encounter of 07/26/20  (from the past 48 hour(s))  Basic metabolic panel     Status: Abnormal   Collection Time: 07/30/20  9:44 AM  Result Value Ref Range   Sodium 135 135 - 145 mmol/L   Potassium 3.8 3.5 - 5.1 mmol/L   Chloride 99 98 - 111 mmol/L   CO2 26 22 -  32 mmol/L   Glucose, Bld 116 (H) 70 - 99 mg/dL    Comment: Glucose reference range applies only to samples taken after fasting for at least 8 hours.   BUN 7 (L) 8 - 23 mg/dL   Creatinine, Ser 9.14 0.44 - 1.00 mg/dL   Calcium 9.5 8.9 - 78.2 mg/dL   GFR, Estimated >95 >62 mL/min    Comment: (NOTE) Calculated using the CKD-EPI Creatinine Equation (2021)    Anion gap 10 5 - 15    Comment: Performed at [facility], [address] [place], [place] [address]  CBC     Status: Abnormal   Collection Time: 08/01/20  3:59 AM  Result Value Ref Range   WBC 6.4 4.0 - 10.5 K/uL   RBC 4.04 3.87 - 5.11 MIL/uL   Hemoglobin 11.2 (L) 12.0 - 15.0 g/dL   HCT 57.8 (L) 46.9 - 62.9 %   MCV 86.1 80.0 - 100.0 fL   MCH 27.7 26.0 - 34.0 pg   MCHC 32.2 30.0 - 36.0 g/dL   RDW 52.8 41.3 - 24.4 %   Platelets 257 150 - 400 K/uL   nRBC 0.0 0.0 - 0.2 %    Comment: Performed at [facility], [address], [place], [place] [address]   DG Knee Complete 4 Views Left  Result Date: 07/30/2020 CLINICAL DATA:  Left knee pain.  Multiple falls. EXAM: LEFT KNEE - COMPLETE 4+ VIEW COMPARISON:  None. FINDINGS: No evidence of fracture or dislocation. Mild medial compartment joint space narrowing. Mild tricompartmental peripheral spurring. Chondrocalcinosis. Small to moderate joint effusion. Soft tissues are unremarkable. IMPRESSION: 1. Mild tricompartmental osteoarthritis and chondrocalcinosis. 2. Small to moderate joint effusion. 3. No fracture. Electronically Signed   By: Narda Rutherford M.D.   On: 07/30/2020 17:53   VAS [place] LOWER EXTREMITY VENOUS (DVT)  Result Date: 07/31/2020  Lower Venous DVT Study Patient Name:  SHERRILYNN GUDGEL  Date of Exam:   07/30/2020 Medical Rec #:  [id number]        Accession #:    [id number] Date of Birth: Jun 07, 1945         Patient Gender: F Patient Age:   85Y Exam Location:  [facility] Procedure:      VAS [place] LOWER EXTREMITY VENOUS (DVT) Referring Phys: [id number] KELLY OSBORNE --------------------------------------------------------------------------------  Indications: New swelling RT knee. Other Indications: S/P fall 07-26-2020. Limitations: Patient unable to cooperate with repositioning. Comparison Study: 07-31-2017 Prior LT lower extremity venous was negative for                   DVT. 06-04-2020 Prior RT lower extremity venous study. Performing Technologist: Jean Rosenthal RDMS,RVT  Examination Guidelines: A complete evaluation includes B-mode imaging, spectral Doppler, color Doppler, and power Doppler as needed of all accessible portions of each vessel. Bilateral testing is considered an integral part of a complete examination. Limited examinations for reoccurring indications may be performed as noted. The reflux portion of the exam is performed with the patient in reverse Trendelenburg.  +-----+---------------+---------+-----------+----------+--------------+ RIGHTCompressibilityPhasicitySpontaneityPropertiesThrombus Aging +-----+---------------+---------+-----------+----------+--------------+ CFV  Full           Yes      Yes                                 +-----+---------------+---------+-----------+----------+--------------+   +---------+---------------+---------+-----------+----------+-----------------+ LEFT     CompressibilityPhasicitySpontaneityPropertiesThrombus Aging    +---------+---------------+---------+-----------+----------+-----------------+ CFV  Full           Yes      Yes                                    +---------+---------------+---------+-----------+----------+-----------------+ SFJ      Full                                                            +---------+---------------+---------+-----------+----------+-----------------+ FV Prox  Full                                                           +---------+---------------+---------+-----------+----------+-----------------+ FV Mid   Full                                                           +---------+---------------+---------+-----------+----------+-----------------+ FV DistalFull                                                           +---------+---------------+---------+-----------+----------+-----------------+ PFV      Full                                                           +---------+---------------+---------+-----------+----------+-----------------+ POP      Full           Yes      Yes                                    +---------+---------------+---------+-----------+----------+-----------------+ PTV      Full                                                           +---------+---------------+---------+-----------+----------+-----------------+ PERO     Partial        Yes      Yes                  Age Indeterminate +---------+---------------+---------+-----------+----------+-----------------+     Summary: RIGHT: - No evidence of common femoral vein obstruction.  LEFT: - Findings consistent with age indeterminate deep vein thrombosis involving the left peroneal veins. - A cystic structure measuring 4.9 cm is found in the popliteal fossa.  *See table(s) above for measurements and observations. Electronically signed by Waverly Ferrari MD on 07/31/2020  at 7:31:22 AM.    Final        Medical Problem List and Plan: 1.  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  Completed 7-day course of Keppra for seizure prophylaxis.  Follow-up Dr. Conchita ParisNundkumar  -patient may   Shower if radial splint cover  -ELOS/Goals: 17-20 days/ supervision goals with PT, SLP and sup/min OT 2.  Antithrombotics: -DVT/anticoagulation: Age-indeterminate left peroneal DVT  identified on venous Dopplers 07/30/2020.  No current plans for long-term anticoagulation due to risk of fall/SDH.  Follow-up vascular study while on rehab  -antiplatelet therapy: Lovenox 30 mg every 12 hours 3. Pain Management: Naprosyn 250 mg twice daily, Ultram as needed 4. Mood: Provide emotional support  -antipsychotic agents: N/A 5. Neuropsych: This patient is not capable of making decisions on her own behalf. 6. Skin/Wound Care: Routine skin checks 7. Fluids/Electrolytes/Nutrition: Routine in and outs with follow-up chemistries 8.  Right maxillary sinus, right lateral orbital wall and floor fractures with fat herniation.  Follow-up outpatient Dr Leta Baptisthimmappa.  No surgical intervention planned 9.  Left ICA stenosis.  50% noted.  Follow-up outpatient. 10.  Hypertension.  Norvasc 5 mg daily.  Monitor as she mobilizes with PT/OT 11.  Incidental finding of abnormal appearing endometrium identified on MRI lumbar spine.  Follow-up outpatient OB/GYN 12.  Right distal comminuted distal radius fracture and minimally displaced radial head fracture.  No surgical intervention at this time follow-up outpatient Dr. Merlyn LotKuzma.    -Nonweightbearing with splint RUE.  -encourage pt to keep splint in place   Charlton AmorDaniel J Angiulli, PA-C 08/01/2020

## 2020-08-01 NOTE — Progress Notes (Signed)
Physical Therapy Treatment Patient Details Name: Danielle Kirby MRN: [id number] DOB: 11-24-1945 Today's Date: 08/01/2020    History of Present Illness This 75 y.o. female admitted 4/21 after multiple falls.  First fall was several days PTA where she fell down 4-6 stairs.  Then fell fell in the bathroom and was found by significant other unresponsive.  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  Fx of all walls of the Rt maxillary sinus with layering hemosinus.  Additional fractures of the Rt lateral orbital wall and orbital floor with herniation of small amount of fat throgh orbital floor.  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  She was also found to have Rt wrist/elbow fx (NWB Rt UE).  PMH includes: cataracts, arthritis    PT Comments    Pt continues with lack of initiation to perform sit to stands due to left knee pain/weakness. Initiated session with PROM to left lower extremity. Pt requiring two person maximal assist for bed mobility. Worked on functional reaching in all planes and forward reaching to foot to promote weight shift for pre transfer training. Continue to recommend comprehensive inpatient rehab (CIR) for post-acute therapy needs.     Follow Up Recommendations  CIR;Supervision/Assistance - 24 hour     Equipment Recommendations  Wheelchair (measurements PT);Wheelchair cushion (measurements PT);Hospital bed;Other (comment) (hoyer lift)    Recommendations for Other Services       Precautions / Restrictions Precautions Precautions: Fall Precaution Comments: RUE sling and sugar tong splint Required Braces or Orthoses: Sling Restrictions Weight Bearing Restrictions: Yes RUE Weight Bearing: Non weight bearing    Mobility  Bed Mobility Overal bed mobility: Needs Assistance Bed Mobility: Supine to Sit;Sit to Supine     Supine to sit: Max assist;+2  for physical assistance Sit to supine: Max assist;+2 for physical assistance   General bed mobility comments: MaxA + 2 for initiationg and execution of supine <> sit    Transfers Overall transfer level: Needs assistance Equipment used: 2 person hand held assist             General transfer comment: Unable to initiate to stand with totalA + 2, minimal hip clearance  Ambulation/Gait                 Stairs             Wheelchair Mobility    Modified Rankin (Stroke Patients Only)       Balance Overall balance assessment: Needs assistance Sitting-balance support: Feet supported Sitting balance-Leahy Scale: Good                                      Cognition Arousal/Alertness: Awake/alert Behavior During Therapy: Flat affect Overall Cognitive Status: Impaired/Different from baseline Area of Impairment: Orientation;Attention;Memory;Following commands;Safety/judgement;Awareness;Problem solving               Rancho Levels of Cognitive Functioning Rancho Los Amigos Scales of Cognitive Functioning: Confused/inappropriate/non-agitated Orientation Level: Disoriented to;Time;Situation;Place Current Attention Level: Sustained Memory: Decreased short-term memory Following Commands: Follows one step commands inconsistently Safety/Judgement: Decreased awareness of deficits;Decreased awareness of safety Awareness: Intellectual Problem Solving: Decreased initiation;Difficulty sequencing;Requires verbal cues General Comments: Pt drowsy today, able to arouse with min-mod stimulation. Follows 1-3 step commands i.e. reaching, therapeutic exercises, washing face with L hand, but has difficulty initiating with tasks such as  bed mobility and attempts to stand.      Exercises General Exercises - Lower Extremity Ankle Circles/Pumps: AROM;Left;10 reps;Supine Heel Slides: AAROM;Left;Seated;10 reps Other Exercises Other Exercises: Sitting: functional  reaching with LUE in all planes to facilitate dynamic balance outside of base of support and weight shift Other Exercises: Sitting: forward reaching to toes with LUE x 5    General Comments        Pertinent Vitals/Pain Pain Assessment: Faces Faces Pain Scale: Hurts whole lot Pain Location: Anytime L knee moved pt grimaced and vocalized pain. Pain Descriptors / Indicators: Grimacing;Guarding Pain Intervention(s): Limited activity within patient's tolerance;Monitored during session    Home Living                      Prior Function            PT Goals (current goals can now be found in the care plan section) Acute Rehab PT Goals Patient Stated Goal: none stated Potential to Achieve Goals: Good Progress towards PT goals: Not progressing toward goals - comment (limited by pain)    Frequency    Min 4X/week      PT Plan Current plan remains appropriate    Co-evaluation              AM-PAC PT "6 Clicks" Mobility   Outcome Measure  Help needed turning from your back to your side while in a flat bed without using bedrails?: Total Help needed moving from lying on your back to sitting on the side of a flat bed without using bedrails?: Total Help needed moving to and from a bed to a chair (including a wheelchair)?: Total Help needed standing up from a chair using your arms (e.g., wheelchair or bedside chair)?: Total Help needed to walk in hospital room?: Total Help needed climbing 3-5 steps with a railing? : Total 6 Click Score: 6    End of Session   Activity Tolerance: Patient limited by pain Patient left: in bed;with bed alarm set;with call bell/phone within reach Nurse Communication: Mobility status PT Visit Diagnosis: Unsteadiness on feet (R26.81);Other abnormalities of gait and mobility (R26.89);Muscle weakness (generalized) (M62.81);Repeated falls (R29.6);History of falling (Z91.81);Difficulty in walking, not elsewhere classified (R26.2);Other symptoms  and signs involving the nervous system (R29.898)     Time: 2025-4270 PT Time Calculation (min) (ACUTE ONLY): 21 min  Charges:  $Therapeutic Activity: 8-22 mins                     Lillia Pauls, PT, DPT Acute Rehabilitation Services Pager [phone] Office [phone]    Danielle Kirby 08/01/2020, 4:28 PM

## 2020-08-02 ENCOUNTER — Other Ambulatory Visit: Payer: Self-pay

## 2020-08-02 ENCOUNTER — Encounter (HOSPITAL_COMMUNITY): Payer: Self-pay | Admitting: Physical Medicine & Rehabilitation

## 2020-08-02 ENCOUNTER — Inpatient Hospital Stay (HOSPITAL_COMMUNITY)
Admission: RE | Admit: 2020-08-02 | Discharge: 2020-08-30 | DRG: 092 | Disposition: A | Discharge status: Home / Self Care | Payer: Medicare Other | Source: Intra-hospital | Attending: Physical Medicine & Rehabilitation | Admitting: Physical Medicine & Rehabilitation

## 2020-08-02 DIAGNOSIS — S0990XA Unspecified injury of head, initial encounter: Secondary | ICD-10-CM

## 2020-08-02 DIAGNOSIS — S52123A Displaced fracture of head of unspecified radius, initial encounter for closed fracture: Secondary | ICD-10-CM

## 2020-08-02 DIAGNOSIS — S069X0A Unspecified intracranial injury without loss of consciousness, initial encounter: Secondary | ICD-10-CM

## 2020-08-02 DIAGNOSIS — I6522 Occlusion and stenosis of left carotid artery: Secondary | ICD-10-CM | POA: Diagnosis present

## 2020-08-02 DIAGNOSIS — S52501A Unspecified fracture of the lower end of right radius, initial encounter for closed fracture: Secondary | ICD-10-CM

## 2020-08-02 DIAGNOSIS — S065X9S Traumatic subdural hemorrhage with loss of consciousness of unspecified duration, sequela: Secondary | ICD-10-CM | POA: Diagnosis present

## 2020-08-02 DIAGNOSIS — R609 Edema, unspecified: Secondary | ICD-10-CM | POA: Diagnosis present

## 2020-08-02 DIAGNOSIS — W19XXXD Unspecified fall, subsequent encounter: Secondary | ICD-10-CM | POA: Diagnosis present

## 2020-08-02 DIAGNOSIS — W19XXXS Unspecified fall, sequela: Secondary | ICD-10-CM | POA: Diagnosis present

## 2020-08-02 DIAGNOSIS — S0240CD Maxillary fracture, right side, subsequent encounter for fracture with routine healing: Secondary | ICD-10-CM | POA: Diagnosis not present

## 2020-08-02 DIAGNOSIS — R4182 Altered mental status, unspecified: Secondary | ICD-10-CM

## 2020-08-02 DIAGNOSIS — S02841D Fracture of lateral orbital wall, right side, subsequent encounter for fracture with routine healing: Secondary | ICD-10-CM | POA: Diagnosis not present

## 2020-08-02 DIAGNOSIS — G479 Sleep disorder, unspecified: Secondary | ICD-10-CM | POA: Diagnosis present

## 2020-08-02 DIAGNOSIS — Z833 Family history of diabetes mellitus: Secondary | ICD-10-CM | POA: Diagnosis not present

## 2020-08-02 DIAGNOSIS — S069X1S Unspecified intracranial injury with loss of consciousness of 30 minutes or less, sequela: Secondary | ICD-10-CM

## 2020-08-02 DIAGNOSIS — S0231XD Fracture of orbital floor, right side, subsequent encounter for fracture with routine healing: Secondary | ICD-10-CM

## 2020-08-02 DIAGNOSIS — I10 Essential (primary) hypertension: Secondary | ICD-10-CM | POA: Diagnosis present

## 2020-08-02 DIAGNOSIS — S52571D Other intraarticular fracture of lower end of right radius, subsequent encounter for closed fracture with routine healing: Secondary | ICD-10-CM

## 2020-08-02 DIAGNOSIS — M1990 Unspecified osteoarthritis, unspecified site: Secondary | ICD-10-CM | POA: Diagnosis present

## 2020-08-02 DIAGNOSIS — D62 Acute posthemorrhagic anemia: Secondary | ICD-10-CM | POA: Diagnosis present

## 2020-08-02 DIAGNOSIS — R451 Restlessness and agitation: Secondary | ICD-10-CM | POA: Diagnosis present

## 2020-08-02 DIAGNOSIS — I82452 Acute embolism and thrombosis of left peroneal vein: Secondary | ICD-10-CM | POA: Diagnosis present

## 2020-08-02 DIAGNOSIS — S52124D Nondisplaced fracture of head of right radius, subsequent encounter for closed fracture with routine healing: Secondary | ICD-10-CM | POA: Diagnosis not present

## 2020-08-02 DIAGNOSIS — S069X9A Unspecified intracranial injury with loss of consciousness of unspecified duration, initial encounter: Secondary | ICD-10-CM | POA: Diagnosis present

## 2020-08-02 DIAGNOSIS — Z9181 History of falling: Secondary | ICD-10-CM

## 2020-08-02 DIAGNOSIS — R296 Repeated falls: Secondary | ICD-10-CM | POA: Diagnosis present

## 2020-08-02 DIAGNOSIS — S52121D Displaced fracture of head of right radius, subsequent encounter for closed fracture with routine healing: Secondary | ICD-10-CM

## 2020-08-02 DIAGNOSIS — G8918 Other acute postprocedural pain: Secondary | ICD-10-CM | POA: Diagnosis not present

## 2020-08-02 DIAGNOSIS — M7989 Other specified soft tissue disorders: Secondary | ICD-10-CM | POA: Diagnosis not present

## 2020-08-02 DIAGNOSIS — S069X1D Unspecified intracranial injury with loss of consciousness of 30 minutes or less, subsequent encounter: Secondary | ICD-10-CM | POA: Diagnosis not present

## 2020-08-02 DIAGNOSIS — Z8249 Family history of ischemic heart disease and other diseases of the circulatory system: Secondary | ICD-10-CM

## 2020-08-02 DIAGNOSIS — I671 Cerebral aneurysm, nonruptured: Secondary | ICD-10-CM | POA: Diagnosis present

## 2020-08-02 DIAGNOSIS — S52509A Unspecified fracture of the lower end of unspecified radius, initial encounter for closed fracture: Secondary | ICD-10-CM

## 2020-08-02 DIAGNOSIS — K5900 Constipation, unspecified: Secondary | ICD-10-CM | POA: Diagnosis present

## 2020-08-02 DIAGNOSIS — I82402 Acute embolism and thrombosis of unspecified deep veins of left lower extremity: Secondary | ICD-10-CM | POA: Diagnosis not present

## 2020-08-02 DIAGNOSIS — S069XAA Unspecified intracranial injury with loss of consciousness status unknown, initial encounter: Secondary | ICD-10-CM | POA: Diagnosis present

## 2020-08-02 DIAGNOSIS — S52501S Unspecified fracture of the lower end of right radius, sequela: Secondary | ICD-10-CM

## 2020-08-02 DIAGNOSIS — S069X0D Unspecified intracranial injury without loss of consciousness, subsequent encounter: Secondary | ICD-10-CM | POA: Diagnosis not present

## 2020-08-02 LAB — CBC
HCT: 36.5 % (ref 36.0–46.0)
Hemoglobin: 11.5 g/dL — ABNORMAL LOW (ref 12.0–15.0)
MCH: 27.4 pg (ref 26.0–34.0)
MCHC: 31.5 g/dL (ref 30.0–36.0)
MCV: 87.1 fL (ref 80.0–100.0)
Platelets: 294 10*3/uL (ref 150–400)
RBC: 4.19 MIL/uL (ref 3.87–5.11)
RDW: 14.2 % (ref 11.5–15.5)
WBC: 5.8 10*3/uL (ref 4.0–10.5)
nRBC: 0 % (ref 0.0–0.2)

## 2020-08-02 LAB — CREATININE, SERUM
Creatinine, Ser: 0.62 mg/dL (ref 0.44–1.00)
GFR, Estimated: >60 mL/min (ref >60)

## 2020-08-02 MED ORDER — ENOXAPARIN SODIUM 30 MG/0.3ML IJ SOSY
30.0000 mg | PREFILLED_SYRINGE | Freq: Two times a day (BID) | INTRAMUSCULAR | Status: DC
  Administered 2020-08-03 – 2020-08-30 (×51): 30 mg via SUBCUTANEOUS
  Filled 2020-08-02 (×55): qty 0.3

## 2020-08-02 MED ORDER — LEVETIRACETAM 500 MG PO TABS
500.0000 mg | ORAL_TABLET | Freq: Two times a day (BID) | ORAL | Status: AC
  Filled 2020-08-02 (×2): qty 1

## 2020-08-02 MED ORDER — AMLODIPINE BESYLATE 5 MG PO TABS
5.0000 mg | ORAL_TABLET | Freq: Every day | ORAL | Status: DC
  Administered 2020-08-03: 5 mg via ORAL
  Filled 2020-08-02: qty 1

## 2020-08-02 MED ORDER — ONDANSETRON HCL 4 MG/2ML IJ SOLN: 4.0000 mg | Freq: Four times a day (QID) | INTRAMUSCULAR | Status: DC | PRN

## 2020-08-02 MED ORDER — POLYETHYLENE GLYCOL 3350 17 G PO PACK
17.0000 g | PACK | Freq: Every day | ORAL | Status: DC | PRN
  Administered 2020-08-07 – 2020-08-27 (×2): 17 g via ORAL
  Filled 2020-08-02 (×4): qty 1

## 2020-08-02 MED ORDER — ENOXAPARIN SODIUM 30 MG/0.3ML IJ SOSY: 30.0000 mg | PREFILLED_SYRINGE | Freq: Two times a day (BID) | INTRAMUSCULAR | Status: DC

## 2020-08-02 MED ORDER — ACETAMINOPHEN 325 MG PO TABS
325.0000 mg | ORAL_TABLET | ORAL | Status: DC | PRN
  Administered 2020-08-15: 650 mg via ORAL
  Filled 2020-08-02: qty 2

## 2020-08-02 MED ORDER — DOCUSATE SODIUM 100 MG PO CAPS
100.0000 mg | ORAL_CAPSULE | Freq: Two times a day (BID) | ORAL | Status: DC
  Administered 2020-08-03 – 2020-08-29 (×47): 100 mg via ORAL
  Filled 2020-08-02 (×62): qty 1

## 2020-08-02 MED ORDER — TRAMADOL HCL 50 MG PO TABS: 50.0000 mg | ORAL_TABLET | Freq: Four times a day (QID) | ORAL | Status: DC | PRN

## 2020-08-02 MED ORDER — ONDANSETRON 4 MG PO TBDP
4.0000 mg | ORAL_TABLET | Freq: Four times a day (QID) | ORAL | Status: DC | PRN
Start: 2020-08-02 — End: 2020-08-30

## 2020-08-02 MED ORDER — NAPROXEN 250 MG PO TABS
250.0000 mg | ORAL_TABLET | Freq: Two times a day (BID) | ORAL | Status: DC
  Administered 2020-08-03 – 2020-08-27 (×44): 250 mg via ORAL
  Filled 2020-08-02 (×51): qty 1

## 2020-08-02 NOTE — Progress Notes (Signed)
Patient admitted to Rehab wwith no issues. Alert and oriented x1 (self) with confusion, but easily redirected. Patient able to verbalize needs at times, with short attention span. PERRLA. Lung sounds are clear but diminished. Bowel sounds are normoactive x4. No abdominal distention or tenderness noted. Pulses are strong and equal to all four extremities. Skin is intact, except for scattered bruising noted to BUE and BLE. Skin warm and dry. Family member at bedside at this time. No complaints of pain or discomfort reported at this time. Incontinent of B/B. Regular diet/thin liquids. Call bell in reach. Will continue to monitor.

## 2020-08-02 NOTE — Progress Notes (Signed)
Physical Therapy Treatment Patient Details Name: Danielle Kirby MRN: [id number] DOB: [birth year] Today's Date: 08/02/2020    History of Present Illness This 75 y.o. female admitted 4/21 after multiple falls.  First fall was several days PTA where she fell down 4-6 stairs.  Then fell fell in the bathroom and was found by significant other unresponsive.  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  Fx of all walls of the Rt maxillary sinus with layering hemosinus.  Additional fractures of the Rt lateral orbital wall and orbital floor with herniation of small amount of fat throgh orbital floor.  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  She was also found to have Rt wrist/elbow fx (NWB Rt UE).  PMH includes: cataracts, arthritis    PT Comments    Pt making great progress with functional mobility this date, clearing her buttocks to come to full stand to allow pt to take steps to the R with HHA to transfer EOB > recliner. Began session with bil lower extremity exercises in bed to warm-up. Progressed pt to lateral scooting transfers EOB to encourage buttocks clearance, with success. Then performed sit > stand and stand step transfer, allowing pt to initiate power up to stand prior to providing physical assistance. Pt needing modAx2 to power up to stand and maxAx2 to facilitate weight shift to L, L knee block, and R leg advancement with gait. Pt still demonstrating confusion, not remembering she had fallen or hurt her R UE. Pt benefits greatly from visual cues. Will continue to follow acutely. Current recommendations remain appropriate.    Follow Up Recommendations  CIR;Supervision/Assistance - 24 hour     Equipment Recommendations  Wheelchair (measurements PT);Wheelchair cushion (measurements PT);Hospital bed;Other (comment) (hoyer lift)    Recommendations for Other Services        Precautions / Restrictions Precautions Precautions: Fall Precaution Comments: RUE sling and sugar tong splint Required Braces or Orthoses: Sling Restrictions Weight Bearing Restrictions: Yes RUE Weight Bearing: Non weight bearing    Mobility  Bed Mobility Overal bed mobility: Needs Assistance Bed Mobility: Supine to Sit     Supine to sit: +2 for physical assistance;Min assist;+2 for safety/equipment     General bed mobility comments: Extra time and verbal, tactile, and visual cues to bring R leg towards therapist off EOB and then bring L leg to R leg. Cues for pt to pull with L hand on therapist's to ascend trunk, minAx2 to complete and square hips with EOB.    Transfers Overall transfer level: Needs assistance Equipment used: 2 person hand held assist Transfers: Sit to/from [employer];Lateral/Scoot Transfers Sit to Stand: Mod assist;+2 physical assistance;+2 safety/equipment Stand pivot transfers: Max assist;+2 physical assistance;+2 safety/equipment      Lateral/Scoot Transfers: Min guard General transfer comment: Began with lateral scoots EOB, 3x each direction, with pt being provided visual cues to scoot towards, min guard and extra time. Increased buttocks clearance with each lateral scoot. Progressed to sit > stand from EOB to bil HHA with pt initiating and then being provided physical assistance of modAx2 to complete power up to stand and hip extension. MaxAx2 to stand step transfer to R to recliner with bil HHA, clearing L foot better than R due to decreased L weight shift and stance. Pt needing visual cues to aim for targets with feet to promote improved step length.  Ambulation/Gait Ambulation/Gait assistance: Max assist;+2 physical assistance;+2 safety/equipment  Gait Distance (Feet): 3 Feet Assistive device: 2 person hand held assist Gait Pattern/deviations: Decreased step length - right;Decreased step length - left;Decreased stride  length;Shuffle;Decreased weight shift to left;Decreased stance time - left;Trunk flexed Gait velocity: reduced Gait velocity interpretation: <1.31 ft/sec, indicative of household ambulator General Gait Details: Pt with UE support with therapists on either side of her, cuing pt through visual, verbal, and tactile cues to take steps to R at EOB to recliner. Pt needing maxAx2 to facilitate weight shifting to L to allow pt to scoot R foot on ground amd to provide stability and block L knee. Cues to improve posture throughout repeatedly.   Stairs             Wheelchair Mobility    Modified Rankin (Stroke Patients Only)       Balance Overall balance assessment: Needs assistance Sitting-balance support: Feet supported Sitting balance-Leahy Scale: Good Sitting balance - Comments: Static sitting EOB no LOB, supervision for safety.   Standing balance support: During functional activity;Single extremity supported Standing balance-Leahy Scale: Poor Standing balance comment: required assist +2 and UE support for static standing                            Cognition Arousal/Alertness: Awake/alert Behavior During Therapy: Flat affect Overall Cognitive Status: Impaired/Different from baseline Area of Impairment: Orientation;Attention;Memory;Following commands;Safety/judgement;Awareness;Problem solving               Rancho Levels of Cognitive Functioning Rancho Los Amigos Scales of Cognitive Functioning: Confused/inappropriate/non-agitated Orientation Level: Disoriented to;Time;Situation;Place Current Attention Level: Sustained Memory: Decreased short-term memory Following Commands: Follows one step commands inconsistently;Follows one step commands with increased time Safety/Judgement: Decreased awareness of deficits;Decreased awareness of safety Awareness: Intellectual Problem Solving: Decreased initiation;Difficulty sequencing;Requires verbal cues;Slow  processing;Requires tactile cues General Comments: Pt needing increased time to process and initiate response to simple multi-modal cues. Pt with poor awareness into her deficits and situation. Pt initially reporting the breakfast in front of her for was for "the other girl" even though she was nitified it was her food. Pt easily forgetting task at hand and needing cues to remain on task.      Exercises General Exercises - Lower Extremity Quad Sets: Both;10 reps;Seated;Strengthening (with legs elevated in recliner) Heel Slides: AAROM;Left;10 reps;AROM;Both;Strengthening;Supine (AAROM at L leg initially, progressed to AROM; R leg AROM throughout) Straight Leg Raises: Strengthening;Both;10 reps;Supine;AAROM;AROM (AAROM at L leg; R leg AROM throughout)    General Comments        Pertinent Vitals/Pain Pain Assessment: Faces Faces Pain Scale: Hurts little more Pain Location: L knee with mobility Pain Descriptors / Indicators: Grimacing;Guarding Pain Intervention(s): Limited activity within patient's tolerance;Monitored during session;Repositioned;Premedicated before session    Home Living                      Prior Function            PT Goals (current goals can now be found in the care plan section) Acute Rehab PT Goals Patient Stated Goal: none stated, agreeable to session PT Goal Formulation: With patient Time For Goal Achievement: 08/10/20 Potential to Achieve Goals: Good Progress towards PT goals: Progressing toward goals    Frequency    Min 4X/week      PT Plan Current plan remains appropriate    Co-evaluation              AM-PAC PT "6 Clicks" Mobility   Outcome Measure  Help needed turning from your back to your side while in a flat bed without using bedrails?: A Lot Help needed moving from lying on your back to sitting on the side of a flat bed without using bedrails?: A Lot Help needed moving to and from a bed to a chair (including a  wheelchair)?: A Lot Help needed standing up from a chair using your arms (e.g., wheelchair or bedside chair)?: A Lot Help needed to walk in hospital room?: Total Help needed climbing 3-5 steps with a railing? : Total 6 Click Score: 10    End of Session Equipment Utilized During Treatment: Gait belt Activity Tolerance: Patient tolerated treatment well Patient left: with call bell/phone within reach;in chair;with chair alarm set Nurse Communication: Mobility status PT Visit Diagnosis: Unsteadiness on feet (R26.81);Other abnormalities of gait and mobility (R26.89);Muscle weakness (generalized) (M62.81);Repeated falls (R29.6);History of falling (Z91.81);Difficulty in walking, not elsewhere classified (R26.2);Other symptoms and signs involving the nervous system (R29.898)     Time: 7322-0254 PT Time Calculation (min) (ACUTE ONLY): 25 min  Charges:  $Therapeutic Activity: 23-37 mins                     Raymond Gurney, PT, DPT Acute Rehabilitation Services  Pager: [phone] Office: [phone]    Jewel Baize 08/02/2020, 4:47 PM

## 2020-08-02 NOTE — Progress Notes (Signed)
Pt is very confused this morning and does not want to take her medications. She asked over and over again about the purpose of taking her medications, but will not agree to take them until she understands why. I have educated her several times, however because of her memory status, she is not convinced of why she needs to take medications. I will leave the room and circle back to try again.

## 2020-08-02 NOTE — Progress Notes (Signed)
Physical Medicine and Rehabilitation Consult Reason for Consult: Altered mental status/TBI Referring Physician: Dr. Violeta Gelinas     HPI: Danielle Kirby is a 75 y.o. right-handed female with unremarkable past medical history except cataracts and osteoarthritis.  Per chart review patient lives with significant other.  Independent prior to admission and active.  Two-level home with 6 steps to entry.  Presented 07/26/2020 after reports of multiple falls.  The first fall was several days prior to admission where she fell down 4-6 stairs.  Then she fell again in the bathroom and found by significant other unresponsive.  Admission chemistries unremarkable potassium 3.4 creatinine 1.08, alcohol negative.  Cranial CT scan showed a small right thalamocapsular hypodensity possibly representing age-indeterminate lacunar infarct.  Small 3 mm largely low-density left cerebral convexity subdural collection suspicious for subdural hematoma or hygroma likely in part chronic.  Right periorbital and premaxillary soft tissue contusion.  Fractures of walls of the right maxillary sinus with layering hemosinus.  Additional fractures of the right lateral orbital wall and orbital floor with herniation small amount of fat through the orbital floor defect and involvement of the infraorbital foramen.  CT angiogram head and neck no emergent large vessel occlusion or hemodynamically significant proximal stenosis intracranially.  Approximately 50% stenosis of the proximal left ICA secondary to predominantly calcified atherosclerosis.  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  MRI of the brain showed no acute infarct.  Numerous small foci of susceptibility artifact within the posterior right temporal and right parietal occipital region without associated edema or hyperdensity compatible with prior microhemorrhages.  MRI lumbar spine showed mild multilevel degenerative  changes as well as incidental abnormal appearance of the endometrium partially imaged but suspicious for possible cancer.  CT of the right wrist/elbow showed comminuted mildly impacted intra-articular fracture of the distal radius, minimally displaced ulnar styloid fracture.  Follow-up neurosurgery regards to small amount of subdural blood as well as incidental left supraclinoid ICA aneurysm follow-up outpatient Dr. Conchita Paris.  Dr. Merlyn Lot follow-up for comminuted distal radius fracture and minimally displaced radial head fracture recommend sugar-tong splint follow-up in office 1 week there was some discussion of possible ORIF of distal radius.  Follow-up Dr.Thimmappa in regards to facial fractures nonoperative follow-up outpatient as well as recommendations of ophthalmology eye exam.  Therapy evaluations completed right upper extremity nonweightbearing, right upper extremity sling and sugar-tong splint.  Placed on Lovenox for DVT prophylaxis.  Maintained on Keppra for seizure prophylaxis.  Patient did have episodes of agitation restlessness requiring Haldol.  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   The patient does not recall any of her injuries from her falls.  She remains disoriented with poor awareness of situation   Review of Systems  Constitutional: Negative for chills and fever.  HENT: Negative for hearing loss.   Eyes: Negative for blurred vision and double vision.  Respiratory: Negative for cough and shortness of breath.   Cardiovascular: Negative for chest pain, palpitations and leg swelling.  Gastrointestinal: Positive for constipation. Negative for heartburn, nausea and vomiting.  Genitourinary: Negative for dysuria, flank pain and hematuria.  Musculoskeletal: Positive for falls and myalgias.  Skin: Negative for rash.  Neurological: Positive for loss of consciousness.  All other systems reviewed and are  negative.       Past Medical History:  Diagnosis Date  . Allergy    .  Arthritis    . Cataract    . Heart murmur           Past Surgical History:  Procedure Laterality Date  . BREAST SURGERY      . COSMETIC SURGERY      . EYE SURGERY      . TUBAL LIGATION             Family History  Problem Relation Age of Onset  . Diabetes Mother    . Hypertension Father      Social History:  reports that she has never smoked. She has never used smokeless tobacco. She reports that she does not drink alcohol and does not use drugs. Allergies:       Allergies  Allergen Reactions  . Penicillins        REACTION: hives          Medications Prior to Admission  Medication Sig Dispense Refill  . OVER THE COUNTER MEDICATION daily.      . methocarbamol (ROBAXIN) 500 MG tablet Take 1 tablet (500 mg total) by mouth 2 (two) times daily. (Patient not taking: No sig reported) 20 tablet 0      Home: Home Living Family/patient expects to be discharged to:: Private residence Living Arrangements: Spouse/significant other Available Help at Discharge: Family,Available 24 hours/day Type of Home: House Home Access: Stairs to enter [employer] of Steps: 6-7 Entrance Stairs-Rails: Right,Left,Can reach both Home Layout: Other (Comment) (split level house; basement on lower level/bedroom on upper level) Alternate Level Stairs-Number of Steps: 2 steps Bathroom Shower/Tub: Engineer, manufacturing systems: Standard Bathroom Accessibility: Yes Home Equipment: None Additional Comments: Pt lives with her significant other  Lives With: Significant other  Functional History: Prior Function Level of Independence: Independent Comments: Per pt's significant other, Pt was fully independent.  Drives. cleans, managed own finances and medications.  She worked for the [employer] for 30 years Functional Status:  Mobility: Bed Mobility Overal bed mobility: Needs Assistance Bed Mobility: Supine to  Sit Supine to sit: Mod assist General bed mobility comments: assist to initiate movement and assist to lift trunk Transfers Overall transfer level: Needs assistance Equipment used: 1 person hand held assist,2 person hand held assist Transfers: Sit to/from [employer] Sit to Stand: Mod assist,+2 physical assistance,+2 safety/equipment Stand pivot transfers: Mod assist,+2 physical assistance,+2 safety/equipment General transfer comment: assist to power up into standing.  She demonstrates heavy Rt lateral lean and requires assist to weight shift bil. to advance feet and assist to advance Lt foot Ambulation/Gait Ambulation/Gait assistance: Mod assist,+2 physical assistance,+2 safety/equipment Gait Distance (Feet): 4 Feet Assistive device: 2 person hand held assist Gait Pattern/deviations: Decreased step length - right,Decreased step length - left,Decreased stride length,Shuffle,Decreased weight shift to left,Decreased stance time - left,Trunk flexed General Gait Details: Pt with UE support with therapists on either side of her, cuing pt through visual, verbal, and tactile cues to take steps to L at EOB to recliner. Pt needing modAx2 to prevent her R lateral lean, facilitate weight shifting to L, and facilitate bil leg advancement. L knee buckling noted, needing intermittent blocking. Gait velocity: reduced Gait velocity interpretation: <1.31 ft/sec, indicative of household ambulator   ADL: ADL Overall ADL's : Needs assistance/impaired Eating/Feeding: Minimal assistance,Sitting Grooming: Wash/dry hands,Wash/dry face,Oral care,Moderate assistance,Sitting Upper Body Bathing: Moderate assistance,Sitting Lower Body Bathing: Sit to/from stand,Maximal assistance Upper Body Dressing : Maximal assistance,Sitting Lower Body Dressing: Maximal assistance,Sit to/from stand Toilet Transfer: Moderate assistance,+2 for physical assistance,+2 for safety/equipment,Stand-pivot,BSC Toileting-  Clothing Manipulation and Hygiene: Total assistance,Sit to/from stand Functional mobility during ADLs: Maximal assistance,+2 for physical assistance,+2 for safety/equipment   Cognition: Cognition Overall Cognitive Status: Impaired/Different from baseline Arousal/Alertness: Lethargic Orientation Level: Oriented to person,Oriented to place,Oriented to time,Disoriented to situation Attention: Focused Focused Attention: Impaired Focused Attention Impairment: Verbal basic,Functional basic Memory: Impaired Memory Impairment: Other (comment) (unable to assess secondary to very poor attention) Awareness: Impaired Awareness Impairment: Intellectual impairment Problem Solving: Impaired Problem Solving Impairment: Verbal basic,Functional basic Behaviors: Restless Safety/Judgment: Impaired [employer] Scales of Cognitive Functioning: Confused/inappropriate/non-agitated Cognition Arousal/Alertness: Awake/alert Behavior During Therapy: Flat affect Overall Cognitive Status: Impaired/Different from baseline Area of Impairment: Orientation,Attention,Memory,Following commands,Safety/judgement,Awareness,Rancho level Orientation Level: Disoriented to,Time,Situation Current Attention Level: Sustained Memory: Decreased short-term memory Following Commands: Follows one step commands consistently,Follows multi-step commands inconsistently Safety/Judgement: Decreased awareness of deficits,Decreased awareness of safety Awareness:  (Pt has no awareness of injuries of reason for admission initially) General Comments: Pt intially only oriented to self and [facility].  She follows one step commands and has no awareness of injuries of deficits.  At end of session, she was able to recall that it is April, "2021", and that she is in hosptal secondary to fall although she still denies injuries   Blood pressure (!) 154/103, pulse (!) 102, temperature 99 F (37.2 C), temperature source Oral, resp. rate  19, height 5\' 7"  (1.702 m), weight 83.6 kg, SpO2 99 %. Physical Exam Vitals reviewed.  Constitutional:      Comments: Patient initially alert but then became drowsy toward the end of the exam.  HENT:     Head: Normocephalic and atraumatic.     Mouth/Throat:     Mouth: Mucous membranes are moist.  Eyes:     Extraocular Movements: Extraocular movements intact.     Conjunctiva/sclera: Conjunctivae normal.     Pupils: Pupils are equal, round, and reactive to light.  Cardiovascular:     Rate and Rhythm: Regular rhythm. Tachycardia present.     Heart sounds: Normal heart sounds.  Pulmonary:     Effort: Pulmonary effort is normal. No respiratory distress.     Breath sounds: Normal breath sounds. No wheezing.  Abdominal:     General: Abdomen is flat. Bowel sounds are normal. There is no distension.     Palpations: Abdomen is soft.  Musculoskeletal:     Comments: Right wrist in a splint.  No pain with finger range of motion.  Patient does have some swelling of her right digits. Patient has effusion left knee.  Pain with left knee range of motion.  Skin:    General: Skin is warm and dry.  Neurological:     Mental Status: She is alert.     Comments: Patient is alert in no acute distress.  Makes eye contact with examiner.  Noted multiple bruising around the face.  She does provide her name and age but very poor historian in regards to hospital course Oriented to person and place but not time, thought the month was September, thought the year was 2000  Right upper extremity did not do muscle testing secondary to wrist fracture patient in sling and wrist splint. Right lower extremity has 4-5 strength Left upper extremity is 5/5 strength Left lower extremity is limited by knee pain for testing, poor participation with manual muscle testing as the patient was becoming more lethargic   Psychiatric:        Behavior: Behavior normal.        Lab Results Last 24  Hours  No results found for this  or any previous visit (from the past 24 hour(s)).   Imaging Results (Last 48 hours)  No results found.       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 Does the need for close, 24 hr/day medical supervision in concert with the patient's rehab needs make it unreasonable for this patient to be served in a less intensive setting? Yes 2. Co-Morbidities requiring supervision/potential complications: Left knee effusion, right radial fracture, facial fractures, urinary incontinence 3. Due to bladder management, bowel management, safety, skin/wound care, disease management, medication administration, pain management and patient education, does the patient require 24 hr/day rehab nursing? Yes 4. Does the patient require coordinated care of a physician, rehab nurse, therapy disciplines of PT, OT, speech to address physical and functional deficits in the context of the above medical diagnosis(es)? Yes Addressing deficits in the following areas: balance, endurance, locomotion, strength, transferring, bowel/bladder control, bathing, dressing, feeding, grooming, toileting, cognition and psychosocial support 5. Can the patient actively participate in an intensive therapy program of at least 3 hrs of therapy per day at least 5 days per week? Potentially 6. The potential for patient to make measurable gains while on inpatient rehab is good 7. Anticipated functional outcomes upon discharge from inpatient rehab are supervision  with PT, supervision and min assist with OT, supervision with SLP. 8. Estimated rehab length of stay to reach the above functional goals is: 17 to 20 days 9. Anticipated discharge destination: Home 10. Overall Rehab/Functional Prognosis: good   RECOMMENDATIONS: This patient's condition is appropriate for continued rehabilitative care in the following setting: CIR once left knee swelling is evaluated as well as tachycardia Patient has  agreed to participate in recommended program. Yes Note that insurance prior authorization may be required for reimbursement for recommended care.   Comment:    Charlton AmorDaniel J Angiulli, PA-C 07/30/2020    "I have personally performed a face to face diagnostic evaluation of this patient.  Additionally, I have reviewed and concur with the physician assistant's documentation above." Erick ColaceAndrew E. Kirsteins M.D. [facility] Fellow Am [employer] of [employer] and Re[employer] Diplomate Am Board of Electrodiagnostic Med Fellow Am Board of Interventional Pain

## 2020-08-02 NOTE — PMR Pre-admission (Signed)
PMR Admission Coordinator Pre-Admission Assessment  Patient: Danielle Kirby is an 75 y.o., female MRN: [id number] DOB: 09/02/1945 Height: 5\' 7"  (170.2 cm) Weight: 83.6 kg              Insurance Information HMO:     PPO:      PCP:      IPA:      80/20: yes     OTHER:  PRIMARY: Medicare A      Policy#: [id number]      Subscriber: patient CM Name:       Phone#:      Fax#:  Pre-Cert#:       Employer:  Benefits:  Phone #: verified eligibility online via [employer] on 07/29/20     Name:  Eff. 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      Policy#: [id number]     Phone#: [phone]  Financial Counselor:       Phone#:   The "Data Collection Information Summary" for patients in Inpatient Rehabilitation Facilities with attached "Privacy Act Statement-Health Care Records" was provided and verbally reviewed with: Family  Emergency Contact Information Contact Information    Name Relation Home Work [employer]   Wiley, Morningsidelyde Significant other   [phone]     Current Medical History  Patient Admitting Diagnosis: TBI History of Present Illness: Danielle Kirby is a 75 year old right-handed female with unremarkable past medical history except cataracts and osteoarthritis.  Presented 07/26/2020 after reports of multiple falls.  The first fall was several days prior to admission where she fell down 4-6 stairs; she then  fell again in the bathroom and found by significant other unresponsive.  Admission chemistries unremarkable except potassium 3.3 creatinine 1.08 alcohol negative.  Cranial CT scan showed a small right thalamocapsular hypodensity possibly representing age-indeterminate lacunar infarction.  Small 3 mm largely low-density left cerebral  convexity subdural collection suspicious for subdural hematoma or hygroma likely in part chronic.  Right periorbital and premaxillary soft tissue contusion.  Fractures of walls of the right maxillary sinus with layering hemosinus.  Additional fractures of the right lateral orbital wall and orbital floor with herniation small amount of fat through the orbital floor defect and involvement of the infraorbital foramen.  CT angiogram head and neck no emergent large vessel occlusion or hemodynamically significant proximal stenosis intracranially.  Approximately 50% stenosis of the proximal left ICA secondary to predominantly calcified atherosclerosis.  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  MRI of the brain showed no acute infarction.  Numerous small foci of susceptibility artifact within the posterior right temporal and right parietal occipital region without associated edema or hyperdensity compatible with prior microhemorrhages.  MRI lumbar spine showed mild multilevel degenerative change as well as incidental abnormal appearance of the endometrium partially imaged but suspicious for possible cancer and patient was to follow-up outpatient OB/GYN.  CT of the right wrist/elbow showed comminuted mildly impacted right intra-articular fracture of the distal radius, minimally displaced ulnar styloid fracture.  Follow-up neurosurgery regards to small amount of subdural blood as well as  incidental left supraclinoid ICA aneurysm follow-up outpatient Dr. Conchita Paris.  Dr. Merlyn Lot follow-up for comminuted distal radius fracture minimally displaced radial head fracture recommend sugar-tong splint follow-up in 1 week there was some discussion of possible ORIF of distal radius.  Follow-up Dr. Leta Baptist in regards to facial fractures nonoperative follow-up outpatient as well as recommendations of ophthalmology eye exam.  NWB RUE in a sling.  Venous Doppler studies 07/30/2020  showed age-indeterminate DVT left peroneal vein.  Given recurrent falls/SDH no current recommendations for anticoagulation advised and follow-up vascular study.  She was maintained on subcutaneous Lovenox.  Maintained on Keppra for seizure prophylaxis.  Patient did have episodes of agitation restlessness requiring Haldol.  Pt c/o severe L knee pain, workup negative, suspect gout? Therapy evaluations completed due to patient's traumatic brain injury was recommended for a comprehensive rehab program.   Complete NIHSS TOTAL: 2 Glasgow Coma Scale Score: 14  Past Medical History  Past Medical History:  Diagnosis Date  . Allergy   . Arthritis   . Cataract   . Heart murmur     Family History  family history includes Diabetes in her mother; Hypertension in her father.  Prior Rehab/Hospitalizations:  Has the patient had prior rehab or hospitalizations prior to admission? No  Has the patient had major surgery during 100 days prior to admission? No  Current Medications   Current Facility-Administered Medications:  .  acetaminophen (TYLENOL) tablet 1,000 mg, 1,000 mg, Oral, Q4H PRN, Diamantina Monks, MD, 1,000 mg at 07/31/20 0853 .  amLODipine (NORVASC) tablet 5 mg, 5 mg, Oral, Daily, Simaan, Elizabeth S, PA-C, 5 mg at 08/02/20 0819 .  chlorhexidine (PERIDEX) 0.12 % solution 15 mL, 15 mL, Mouth Rinse, BID, Violeta Gelinas, MD, 15 mL at 08/02/20 0819 .  Chlorhexidine Gluconate Cloth 2 % PADS 6 each, 6 each, Topical, Q0600, Violeta Gelinas, MD, 6 each at 08/02/20 0450 .  docusate sodium (COLACE) capsule 100 mg, 100 mg, Oral, BID, Jacinto Halim, PA-C, 100 mg at 08/02/20 5038 .  enoxaparin (LOVENOX) injection 30 mg, 30 mg, Subcutaneous, Q12H, Isabel Caprice, MD, 30 mg at 08/02/20 0818 .  haloperidol lactate (HALDOL) injection 2 mg, 2 mg, Intravenous, Q4H PRN, Diamantina Monks, MD, 2 mg at 07/28/20 2139 .  hydrALAZINE (APRESOLINE) injection 10 mg, 10 mg, Intravenous, Q4H PRN, Isabel Caprice, MD,  10 mg at 07/31/20 0853 .  levETIRAcetam (KEPPRA) tablet 500 mg, 500 mg, Oral, BID, Violeta Gelinas, MD, 500 mg at 08/02/20 0819 .  MEDLINE mouth rinse, 15 mL, Mouth Rinse, q12n4p, Violeta Gelinas, MD, 15 mL at 07/31/20 1628 .  metoprolol tartrate (LOPRESSOR) injection 5 mg, 5 mg, Intravenous, Q6H PRN, Jacinto Halim, PA-C, 5 mg at 07/28/20 1548 .  naproxen (NAPROSYN) tablet 250 mg, 250 mg, Oral, BID WC, Simaan, Francine Graven, PA-C, 250 mg at 08/02/20 8828 .  ondansetron (ZOFRAN-ODT) disintegrating tablet 4 mg, 4 mg, Oral, Q6H PRN **OR** ondansetron (ZOFRAN) injection 4 mg, 4 mg, Intravenous, Q6H PRN, Maczis, Elmer Sow, PA-C .  polyethylene glycol (MIRALAX / GLYCOLAX) packet 17 g, 17 g, Oral, Daily PRN, Maczis, Elmer Sow, PA-C .  sodium chloride flush (NS) 0.9 % injection 10-40 mL, 10-40 mL, Intracatheter, Q12H, Barnetta Chapel, PA-C, 10 mL at 08/02/20 0820 .  sodium chloride flush (NS) 0.9 % injection 10-40 mL, 10-40 mL, Intracatheter, PRN, Barnetta Chapel, PA-C .  traMADol (ULTRAM) tablet 50 mg, 50 mg, Oral, Q6H PRN, Berna Bue, MD  Patients Current Diet:  Diet Order  Diet regular Room service appropriate? Yes with Assist; Fluid consistency: Thin  Diet effective now                 Precautions / Restrictions Precautions Precautions: Fall Precaution Comments: RUE sling and sugar tong splint Restrictions Weight Bearing Restrictions: Yes RUE Weight Bearing: Non weight bearing   Has the patient had 2 or more falls or a fall with injury in the past year?Yes  Prior Activity Level Community (5-7x/wk): 3-4 days/week out of house. Pt drives  Prior Functional Level Prior Function Level of Independence: Independent Comments: Per pt's significant other, Pt was fully independent.  Drives. cleans, managed own finances and medications.  She worked for the [employer] for 30 years  Self Care: Did the patient need help bathing, dressing, using the toilet or eating?   Independent  Indoor Mobility: Did the patient need assistance with walking from room to room (with or without device)? Independent  Stairs: Did the patient need assistance with internal or external stairs (with or without device)? Independent  Functional Cognition: Did the patient need help planning regular tasks such as shopping or remembering to take medications? Independent  Home Assistive Devices / Equipment Home Assistive Devices/Equipment: None Home Equipment: None  Prior Device Use: Indicate devices/aids used by the patient prior to current illness, exacerbation or injury? None of the above  Current Functional Level Cognition  Arousal/Alertness: Lethargic Overall Cognitive Status: Impaired/Different from baseline Current Attention Level: Sustained Orientation Level: Oriented to person Following Commands: Follows one step commands inconsistently Safety/Judgement: Decreased awareness of deficits,Decreased awareness of safety General Comments: Pt drowsy today, able to arouse with min-mod stimulation. Follows 1-3 step commands i.e. reaching, therapeutic exercises, washing face with L hand, but has difficulty initiating with tasks such as bed mobility and attempts to stand. Attention: Focused Focused Attention: Impaired Focused Attention Impairment: Verbal basic,Functional basic Memory: Impaired Memory Impairment: Other (comment) (unable to assess secondary to very poor attention) Awareness: Impaired Awareness Impairment: Intellectual impairment Problem Solving: Impaired Problem Solving Impairment: Verbal basic,Functional basic Behaviors: Restless Safety/Judgment: Impaired [place] [address] Scales of Cognitive Functioning: Confused/inappropriate/non-agitated    Extremity Assessment (includes Sensation/Coordination)  Upper Extremity Assessment: RUE deficits/detail,LUE deficits/detail RUE Deficits / Details: Pt has removed the sugartong splint and sling.  Sling reapplied with  her tolerating it fairly well.  Rt UE not formally assessed LUE Deficits / Details: WFL: LUE Sensation: WNL LUE Coordination: WNL  Lower Extremity Assessment: Defer to PT evaluation RLE Deficits / Details: Weakness noted with functional mobility with poor R leg advancement (poor weight shift to L though) RLE Coordination: decreased fine motor,decreased gross motor LLE Deficits / Details: Weakness noted through functional mobility with L knee buckling and decreased L weight shift/stance LLE Coordination: decreased fine motor,decreased gross motor    ADLs  Overall ADL's : Needs assistance/impaired Eating/Feeding: Minimal assistance,Sitting Grooming: Wash/dry hands,Wash/dry face,Oral care,Maximal assistance,Sitting Grooming Details (indicate cue type and reason): Pt very lethargic requiring hand over hand assist using LUE to wash face and complete mouth care. Upper Body Bathing: Moderate assistance,Sitting Lower Body Bathing: Sit to/from stand,Maximal assistance Upper Body Dressing : Maximal assistance,Sitting Lower Body Dressing: Maximal assistance,Sit to/from stand Toilet Transfer: Moderate assistance,+2 for physical assistance,+2 for safety/equipment,Stand-pivot,BSC Toileting- Clothing Manipulation and Hygiene: Total assistance,Sit to/from stand Functional mobility during ADLs: Maximal assistance,+2 for physical assistance,+2 for safety/equipment General ADL Comments: Pt max assist with most adls today. Pt very lethargic and not following many commands today. Nursing states pt did not sleep well.  Mobility  Overal bed mobility: Needs Assistance Bed Mobility: Supine to Sit,Sit to Supine Rolling: Max assist Supine to sit: Max assist,+2 for physical assistance Sit to supine: Max assist,+2 for physical assistance General bed mobility comments: MaxA + 2 for initiationg and execution of supine <> sit    Transfers  Overall transfer level: Needs assistance Equipment used: 2 person hand  held assist Transfers: Sit to/from Stand Sit to Stand: +2 physical assistance,Max assist Stand pivot transfers: Mod assist,+2 physical assistance,+2 safety/equipment General transfer comment: Unable to initiate to stand with totalA + 2, minimal hip clearance    Ambulation / Gait / Stairs / Wheelchair Mobility  Ambulation/Gait Ambulation/Gait assistance: Mod assist,+2 physical assistance,+2 safety/equipment Gait Distance (Feet): 4 Feet Assistive device: 2 person hand held assist Gait Pattern/deviations: Decreased step length - right,Decreased step length - left,Decreased stride length,Shuffle,Decreased weight shift to left,Decreased stance time - left,Trunk flexed General Gait Details: Pt with UE support with therapists on either side of her, cuing pt through visual, verbal, and tactile cues to take steps to L at EOB to recliner. Pt needing modAx2 to prevent her R lateral lean, facilitate weight shifting to L, and facilitate bil leg advancement. L knee buckling noted, needing intermittent blocking. Gait velocity: reduced Gait velocity interpretation: <1.31 ft/sec, indicative of household ambulator    Posture / Balance Balance Overall balance assessment: Needs assistance Sitting-balance support: Feet supported Sitting balance-Leahy Scale: Good Standing balance support: Single extremity supported,During functional activity Standing balance-Leahy Scale: Poor Standing balance comment: required assist +2 for static standing    Special needs/care consideration Behavioral consideration TBI     Previous Home Environment (from acute therapy documentation) Living Arrangements: Spouse/significant other  Lives With: Significant other Available Help at Discharge: Family,Available 24 hours/day Type of Home: House Home Layout: Other (Comment) (split level house; basement on lower level/bedroom on upper level) Alternate Level Stairs-Number of Steps: 2 steps Home Access: Stairs to enter Entrance  Stairs-Rails: Right,Left,Can reach both Entrance Stairs-Number of Steps: 6-7 Bathroom Shower/Tub: Associate Professor: Yes How Accessible: Accessible via walker Home Care Services: No Additional Comments: Pt lives with her significant other  Discharge Living Setting Plans for Discharge Living Setting: Patient's home Type of Home at Discharge: House Discharge Home Layout: Other (Comment) (split level house: basement on lower level, bedroom on upper level) Discharge Home Access: Stairs to enter Entrance Stairs-Rails: Right,Left,Can reach both Entrance Stairs-Number of Steps: 6-7 Discharge Bathroom Shower/Tub: Tub/shower unit Discharge Bathroom Toilet: Standard Discharge Bathroom Accessibility: Yes How Accessible: Accessible via walker Does the patient have any problems obtaining your medications?: No  Social/Family/Support Systems Anticipated Caregiver: Marliss Coots, significant other Anticipated Caregiver's Contact Information: [phone] Caregiver Availability: 24/7 Discharge Plan Discussed with Primary Caregiver: Yes Is Caregiver In Agreement with Plan?: Yes Does Caregiver/Family have Issues with Lodging/Transportation while Pt is in Rehab?: No   Goals Patient/Family Goal for Rehab: Supervision: PT/ST, Supervision-Min A: OT Expected length of stay: 17-20 days Pt/Family Agrees to Admission and willing to participate: Yes Program Orientation Provided & Reviewed with Pt/Caregiver Including Roles  & Responsibilities: Yes   Decrease burden of Care through IP rehab admission: n/a  Possible need for SNF placement upon discharge: Potentially.   Patient Condition: This patient's medical and functional status has changed since the consult dated: 07/30/2020 in which the Rehabilitation Physician determined and documented that the patient's condition is appropriate for intensive rehabilitative care in an inpatient rehabilitation facility. See  "History of Present Illness" (above) for medical update. Functional changes  are: pt total assist with all mobility/ADLs. Patient's medical and functional status update has been discussed with the Rehabilitation physician and patient remains appropriate for inpatient rehabilitation. Will admit to inpatient rehab today.  Preadmission Screen Completed By:  Stephania Fragmin, PT, 08/02/2020 11:47 AM ______________________________________________________________________   Discussed status with Dr. Riley Kill on 08/02/20 at 11:51 AM  and received approval for admission today.  Admission Coordinator:  Stephania Fragmin, PT, DPT time 11:51 AM Dorna Bloom 08/02/20

## 2020-08-02 NOTE — Progress Notes (Signed)
BP 187/76, asymptomatic, notified PA Daniel Anguilli, No interventions ordered. Just to observe.

## 2020-08-02 NOTE — Discharge Summary (Signed)
[facility] Discharge Summary   Patient ID: Danielle Kirby MRN: [id number] DOB/AGE: 1945/06/24 75 y.o.  Admit date: 07/26/2020 Discharge date: 08/02/2020  Discharge Diagnosis Patient Active Problem List   Diagnosis Date Noted  . 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 Degenerative arthritis of hip 05/23/2011    Consultants Neurosurgery   Imaging:  Procedures None  HPI:  HPI:  Danielle Kirby is a 75yo female PMH heart murmur who was brought into [facility] via ambulance earlier today after suffering multiple falls.  The patient has no recollection of her falls and isn't aware she even had falls.  She is unsure why she is in the hospital so most of her history is obtained from the chart as her significant other is not present currently either. Per report the patient fell on Sunday. She had been using crutches that Danielle due to left hip pain. Significant other heard her fall down 4-6 stairs and found her at the bottom of the stairs. She complained of hip and facial pain but refused to go to the ED. Since that fall her significant other states that she has been more tired and lethargic. This morning she got up to use the bathroom and suffered a ground level fall, possibly secondary to a syncopal event. Initially she was able to ambulate but fell again therefore her significant other called EMS. Paramedics noted bruising and deformity to the right wrist, diminished use of the RUE, bruising around the right eye, and some difficulty with speech. Code stroke was called pre-hospital.   Anticoagulants: none Nonsmoker Denies alcohol or illicit drug use Lives at home with her significant other.  Hospital Course:  Trauma work up revealed Right wrist/Elbow fx, SDH vs hygroma with 69mm midline shift, and multiple facial fractures. Trauma was asked to see for admission. See below injuries along  with their management:    Multiple falls  Code stroke - MRI negative for acute infarct  Right wrist/Elbow fx- hand surgery consulted,Dr. Lorraine Lax sugar tong splint but patient keeps removing, Dr. 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 Conchita Paris, no follow up scan needed. Keppra x7d R maxillary sinus, R lateral orbital wall and floor fxs with fat herniation - Dr. Leta Baptist (ENT)has seen. No acute surgical intervention. May follow up prn. Recommends outpatient ophthalmology exam Elevated Cr - normalized Abnormal appearing endometrium -can f/u with Gyn as o/p Hx heart murmur Memory deficit- unclear if this is new or not. 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 Outpatient follow up L supraclinoid ICA aneurysm- incidental find, can follow up with Dr. Conchita Paris as outpatient. 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 L knee effusion - arthritis vs gout, continue ice and naproxen BID. L knee bakers cyst    On 08/02/20 the patients vitals were stable, she was neurologically stable, she was tolerating PO, working with therapies, and medically stable [facility].      Current Facility-Administered Medications (Cardiovascular):  .  amLODipine (NORVASC) tablet 5 mg .  hydrALAZINE (APRESOLINE) injection 10 mg .  metoprolol tartrate (LOPRESSOR)  injection 5 mg     Current Facility-Administered Medications (Analgesics):  .  acetaminophen (TYLENOL) tablet 1,000 mg .  naproxen (NAPROSYN) tablet 250 mg .  traMADol (ULTRAM) tablet 50 mg   Current Facility-Administered Medications (Hematological):  .   enoxaparin (LOVENOX) injection 30 mg   Current Facility-Administered Medications (Other):  .  chlorhexidine (PERIDEX) 0.12 % solution 15 mL .  Chlorhexidine Gluconate Cloth 2 % PADS 6 each .  docusate sodium (COLACE) capsule 100 mg .  haloperidol lactate (HALDOL) injection 2 mg .  levETIRAcetam (KEPPRA) tablet 500 mg .  MEDLINE mouth rinse .  ondansetron (ZOFRAN-ODT) disintegrating tablet 4 mg **OR** ondansetron (ZOFRAN) injection 4 mg .  polyethylene glycol (MIRALAX / GLYCOLAX) packet 17 g .  sodium chloride flush (NS) 0.9 % injection 10-40 mL .  sodium chloride flush (NS) 0.9 % injection 10-40 mL  No current outpatient medications on file.      Follow-up Information    Maeola Harman, MD Follow up in 1 month(s).   Specialty: Neurosurgery Why: Follow up for incidental find of brain aneurysm Contact information: [address] [place] [address] [phone]        Glenna Fellows, MD Follow up.   Specialty: Plastic Surgery Why: May follow up as needed for facial fractures Contact information: [address] [place] [address] [phone]        Georgina Quint, MD Follow up in 2 week(s).   Specialty: Internal Medicine Why: post hospital follow up Contact information: [address] [place] [address] [phone]        Betha Loa, MD Follow up.   Specialty: Orthopedic Surgery Contact information: [address] [place] [address] [phone]        ophthalmology Follow up in 1 week(s).   Why: Please follow up with an eye doctor for an eye exam upon discharge              Signed: Hosie Spangle, [facility] 08/02/2020, 2:29 PM

## 2020-08-02 NOTE — Progress Notes (Signed)
Inpatient Rehab Admissions Coordinator:   Noted this AM that pt's primary coverage has been changed to [facility] in chart.  Per record review, [employer], and BCBS representative, BCBS is secondary coverage with Medicare Part A primary.  I have a bed available for this patient to admit to CIR today.  Barnetta Chapel, PA-C, in agreement, will let pt/family and TOC team know.   Estill Dooms, PT, DPT Admissions Coordinator [phone] 08/02/20  11:40 AM

## 2020-08-02 NOTE — H&P (Signed)
Physical Medicine and Rehabilitation Admission H&P       HPI: Danielle Kirby is a 75 year old right-handed female with unremarkable past medical history except cataracts and osteoarthritis.  Per chart review lives with significant other.  Independent prior to admission and active.  Two-level home 6 steps to entry.  Presented 07/26/2020 after reports of multiple falls.  The first fall was several days prior to admission where she fell down 4-6 stairs she then  fell again in the bathroom and found by significant other unresponsive.  Admission chemistries unremarkable except potassium 3.3 creatinine 1.08 alcohol negative.  Cranial CT scan showed a small right thalamocapsular hypodensity possibly representing age-indeterminate lacunar infarction.  Small 3 mm largely low-density left cerebral convexity subdural collection suspicious for subdural hematoma or hygroma likely in part chronic.  Right periorbital and premaxillary soft tissue contusion.  Fractures of walls of the right maxillary sinus with layering hemosinus.  Additional fractures of the right lateral orbital wall and orbital floor with herniation small amount of fat through the orbital floor defect and involvement of the infraorbital foramen.  CT angiogram head and neck no emergent large vessel occlusion or hemodynamically significant proximal stenosis intracranially.  Approximately 50% stenosis of the proximal left ICA secondary to predominantly calcified atherosclerosis.  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  MRI of the brain showed no acute infarction.  Numerous small foci of susceptibility artifact within the posterior right temporal and right parietal occipital region without associated edema or hyperdensity compatible with prior microhemorrhages.  MRI lumbar spine showed mild multilevel degenerative change as well as incidental abnormal appearance of the endometrium partially  imaged but suspicious for possible cancer and patient was to follow-up outpatient OB/GYN.  CT of the right wrist/elbow showed comminuted mildly impacted right intra-articular fracture of the distal radius, minimally displaced ulnar styloid fracture.  Follow-up neurosurgery regards to small amount of subdural blood as well as incidental left supraclinoid ICA aneurysm follow-up outpatient Dr. Conchita Paris.  Dr. Merlyn Lot follow-up for comminuted distal radius fracture minimally displaced radial head fracture recommend sugar-tong splint follow-up in 1 week there was some discussion of possible ORIF of distal radius.  Follow-up Dr. Leta Baptist in regards to facial fractures nonoperative follow-up outpatient as well as recommendations of ophthalmology eye exam.  Therapy evaluations completed right upper extremity nonweightbearing, right upper extremity sling with a sugar-tong splint.  Venous Doppler studies 07/30/2020 showed age-indeterminate DVT left peroneal vein.  Given recurrent falls/SDH no current recommendations for anticoagulation advised follow-up vascular study.  She was maintained on subcutaneous Lovenox..  Maintained on Keppra for seizure prophylaxis.  Patient did have episodes of agitation restlessness requiring Haldol.  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  HENT: Negative for hearing loss.   Eyes: Negative for blurred vision and double vision.  Respiratory: Negative for cough and shortness of breath.   Cardiovascular: Positive for leg swelling. Negative for chest pain and palpitations.  Gastrointestinal: Positive for constipation. Negative for heartburn, nausea and vomiting.  Genitourinary: Negative for dysuria, flank pain and hematuria.  Musculoskeletal: Positive for falls, joint pain and myalgias.  Skin: Negative for rash.  Neurological: Positive for loss of  consciousness.  All other systems reviewed and are negative.   Past Medical History:  Diagnosis Date  . Allergy    . Arthritis    .  Cataract    . Heart murmur           Past Surgical History:  Procedure Laterality Date  . BREAST SURGERY      . COSMETIC SURGERY      . EYE SURGERY      . TUBAL LIGATION             Family History  Problem Relation Age of Onset  . Diabetes Mother    . Hypertension Father      Social History:  reports that she has never smoked. She has never used smokeless tobacco. She reports that she does not drink alcohol and does not use drugs. Allergies:       Allergies  Allergen Reactions  . Penicillins        REACTION: hives          Medications Prior to Admission  Medication Sig Dispense Refill  . OVER THE COUNTER MEDICATION daily.      . methocarbamol (ROBAXIN) 500 MG tablet Take 1 tablet (500 mg total) by mouth 2 (two) times daily. (Patient not taking: No sig reported) 20 tablet 0      Drug Regimen Review Drug regimen was reviewed and remains appropriate with no significant issues identified   Home: Home Living Family/patient expects to be discharged to:: Private residence Living Arrangements: Spouse/significant other Available Help at Discharge: Family,Available 24 hours/day Type of Home: House Home Access: Stairs to enter [employer] of Steps: 6-7 Entrance Stairs-Rails: Right,Left,Can reach both Home Layout: Other (Comment) (split level house; basement on lower level/bedroom on upper level) Alternate Level Stairs-Number of Steps: 2 steps Bathroom Shower/Tub: Engineer, manufacturing systems: Standard Bathroom Accessibility: Yes Home Equipment: None Additional Comments: Pt lives with her significant other  Lives With: Significant other   Functional History: Prior Function Level of Independence: Independent Comments: Per pt's significant other, Pt was fully independent.  Drives. cleans, managed own finances and  medications.  She worked for the [employer] for 30 years   Functional Status:  Mobility: Bed Mobility Overal bed mobility: Needs Assistance Bed Mobility: Supine to Sit,Sit to Supine Rolling: Max assist Supine to sit: Max assist,+2 for physical assistance Sit to supine: Max assist,+2 for physical assistance General bed mobility comments: MaxA + 2 for initiationg and execution of supine <> sit Transfers Overall transfer level: Needs assistance Equipment used: Ambulation equipment used Transfers: Sit to/from Stand Sit to Stand: +2 physical assistance,Max assist Stand pivot transfers: Mod assist,+2 physical assistance,+2 safety/equipment General transfer comment: Unable to initiate to stand to [facility] with totalA + 2 Ambulation/Gait Ambulation/Gait assistance: Mod assist,+2 physical assistance,+2 safety/equipment Gait Distance (Feet): 4 Feet Assistive device: 2 person hand held assist Gait Pattern/deviations: Decreased step length - right,Decreased step length - left,Decreased stride length,Shuffle,Decreased weight shift to left,Decreased stance time - left,Trunk flexed General Gait Details: Pt with UE support with therapists on either side of her, cuing pt through visual, verbal, and tactile cues to take steps to L at EOB to recliner. Pt needing modAx2 to prevent her R lateral lean, facilitate weight shifting to L, and facilitate bil leg advancement. L knee buckling noted, needing intermittent blocking. Gait velocity: reduced Gait velocity interpretation: <1.31 ft/sec, indicative of household ambulator   ADL: ADL Overall ADL's : Needs assistance/impaired Eating/Feeding: Minimal assistance,Sitting Grooming: Wash/dry hands,Wash/dry face,Oral care,Maximal assistance,Sitting Grooming Details (indicate cue type and reason): Pt very lethargic requiring hand over hand assist using LUE to wash face and complete mouth care. Upper Body Bathing: Moderate assistance,Sitting Lower  Body Bathing:  Sit to/from stand,Maximal assistance Upper Body Dressing : Maximal assistance,Sitting Lower Body Dressing: Maximal assistance,Sit to/from stand Toilet Transfer: Moderate assistance,+2 for physical assistance,+2 for safety/equipment,Stand-pivot,BSC Toileting- Clothing Manipulation and Hygiene: Total assistance,Sit to/from stand Functional mobility during ADLs: Maximal assistance,+2 for physical assistance,+2 for safety/equipment General ADL Comments: Pt max assist with most adls today. Pt very lethargic and not following many commands today. Nursing states pt did not sleep well.   Cognition: Cognition Overall Cognitive Status: Impaired/Different from baseline Arousal/Alertness: Lethargic Orientation Level: Oriented to person Attention: Focused Focused Attention: Impaired Focused Attention Impairment: Verbal basic,Functional basic Memory: Impaired Memory Impairment: Other (comment) (unable to assess secondary to very poor attention) Awareness: Impaired Awareness Impairment: Intellectual impairment Problem Solving: Impaired Problem Solving Impairment: Verbal basic,Functional basic Behaviors: Restless Safety/Judgment: Impaired [employer] Scales of Cognitive Functioning: Confused/inappropriate/non-agitated Cognition Arousal/Alertness: Awake/alert Behavior During Therapy: Flat affect Overall Cognitive Status: Impaired/Different from baseline Area of Impairment: Orientation,Attention,Memory,Following commands,Safety/judgement,Awareness,Problem solving Orientation Level: Disoriented to,Time,Situation,Place Current Attention Level: Sustained Memory: Decreased short-term memory Following Commands: Follows one step commands inconsistently Safety/Judgement: Decreased awareness of deficits,Decreased awareness of safety Awareness: Intellectual Problem Solving: Decreased initiation,Difficulty sequencing,Requires verbal cues General Comments: Pt able to sustain attention and follow  commands to perform therapeutic exercises, but has difficulty with initiating activities such as bed mobility and standing from the edge of bed. Pt fixated on her books, asking how old they were repeatedly. Pt asking what physical therapy was for and "if it was going to make her limber."   Physical Exam: Blood pressure (!) 148/78, pulse 96, temperature 99.2 F (37.3 C), temperature source Oral, resp. rate 15, height  (1.702 m), weight 83.6 kg, SpO2 97 %. Physical Exam Constitutional:      General: She is not in acute distress. HENT:     Head: Normocephalic.     Nose: Nose normal.     Mouth/Throat:     Mouth: Mucous membranes are moist.     Pharynx: Oropharynx is clear.     Comments: dentures Eyes:     Extraocular Movements: Extraocular movements intact.     Pupils: Pupils are equal, round, and reactive to light.  Cardiovascular:     Rate and Rhythm: Normal rate and regular rhythm.     Heart sounds: No murmur heard. No gallop.   Pulmonary:     Effort: Pulmonary effort is normal. No respiratory distress.     Breath sounds: No stridor. No wheezing or rhonchi.  Abdominal:     General: Bowel sounds are normal. There is no distension.     Palpations: Abdomen is soft.     Tenderness: There is no abdominal tenderness.  Musculoskeletal:     Cervical back: Neck supple.     Comments: Right wrist with splint. Some swelling in digits of right hand. Hand NVI  Skin:    General: Skin is warm.  Neurological:     Mental Status: She is alert.     Comments: Patient is alert in no acute distress.  Makes eye contact with examiner.  Oriented to self and [facility]. Did not know why she was here. Limited insight and awareness. Occasionally confabulated. Speech generally clear. RUE limited by splint/ortho, able to grip with fingers. LUE 4/5. RLE 3/5 prox to 4/5 distal. LLE 2/5 prox to 4/5 ADF/PF. Senses pai in all 4's  Psychiatric:     Comments: Flat but cooperative        Lab Results Last 48 Hours  Results for orders placed or performed during the hospital encounter of 07/26/20 (from the past 48 hour(s))  Basic metabolic panel     Status: Abnormal    Collection Time: 07/30/20  9:44 AM  Result Value Ref Range    Sodium 135 135 - 145 mmol/L    Potassium 3.8 3.5 - 5.1 mmol/L    Chloride 99 98 - 111 mmol/L    CO2 26 22 - 32 mmol/L    Glucose, Bld 116 (H) 70 - 99 mg/dL      Comment: Glucose reference range applies only to samples taken after fasting for at least 8 hours.    BUN 7 (L) 8 - 23 mg/dL    Creatinine, Ser 2.20 0.44 - 1.00 mg/dL    Calcium 9.5 8.9 - 25.4 mg/dL    GFR, Estimated >27 >06 mL/min      Comment: (NOTE) Calculated using the CKD-EPI Creatinine Equation (2021)      Anion gap 10 5 - 15      Comment: Performed at [facility], [address], [place], [place] [address]  CBC     Status: Abnormal    Collection Time: 08/01/20  3:59 AM  Result Value Ref Range    WBC 6.4 4.0 - 10.5 K/uL    RBC 4.04 3.87 - 5.11 MIL/uL    Hemoglobin 11.2 (L) 12.0 - 15.0 g/dL    HCT 83.1 (L) 51.7 - 46.0 %    MCV 86.1 80.0 - 100.0 fL    MCH 27.7 26.0 - 34.0 pg    MCHC 32.2 30.0 - 36.0 g/dL    RDW 61.6 07.3 - 71.0 %    Platelets 257 150 - 400 K/uL    nRBC 0.0 0.0 - 0.2 %      Comment: Performed at [facility], [address] [place], [place] [address]       Imaging Results (Last 48 hours)  DG Knee Complete 4 Views Left   Result Date: 07/30/2020 CLINICAL DATA:  Left knee pain.  Multiple falls. EXAM: LEFT KNEE - COMPLETE 4+ VIEW COMPARISON:  None. FINDINGS: No evidence of fracture or dislocation. Mild medial compartment joint space narrowing. Mild tricompartmental peripheral spurring. Chondrocalcinosis. Small to moderate joint effusion. Soft tissues are unremarkable. IMPRESSION: 1. Mild tricompartmental osteoarthritis and chondrocalcinosis. 2. Small to moderate joint effusion. 3. No fracture. Electronically Signed   By: Narda Rutherford M.D.   On: 07/30/2020 17:53     VAS [place] LOWER EXTREMITY VENOUS (DVT)   Result Date: 07/31/2020  Lower Venous DVT Study Patient Name:  GERRIE CASTIGLIA  Date of Exam:   07/30/2020 Medical Rec #: [id number]        Accession #:    [id number] Date of Birth: 30-Mar-1946         Patient Gender: F Patient Age:   38Y Exam Location:  [facility] Procedure:      VAS [place] LOWER EXTREMITY VENOUS (DVT) Referring Phys: [id number] KELLY OSBORNE --------------------------------------------------------------------------------  Indications: New swelling RT knee. Other Indications: S/P fall 07-26-2020. Limitations: Patient unable to cooperate with repositioning. Comparison Study: 07-31-2017 Prior LT lower extremity venous was negative for                   DVT. 06-04-2020 Prior RT lower extremity venous study. Performing Technologist: Jean Rosenthal RDMS,RVT  Examination Guidelines: A complete evaluation includes B-mode imaging, spectral Doppler, color Doppler, and power Doppler as needed of all accessible portions of each  vessel. Bilateral testing is considered an integral part of a complete examination. Limited examinations for reoccurring indications may be performed as noted. The reflux portion of the exam is performed with the patient in reverse Trendelenburg.  +-----+---------------+---------+-----------+----------+--------------+ RIGHTCompressibilityPhasicitySpontaneityPropertiesThrombus Aging +-----+---------------+---------+-----------+----------+--------------+ CFV  Full           Yes      Yes                                 +-----+---------------+---------+-----------+----------+--------------+   +---------+---------------+---------+-----------+----------+-----------------+ LEFT     CompressibilityPhasicitySpontaneityPropertiesThrombus Aging    +---------+---------------+---------+-----------+----------+-----------------+ CFV      Full           Yes      Yes                                     +---------+---------------+---------+-----------+----------+-----------------+ SFJ      Full                                                           +---------+---------------+---------+-----------+----------+-----------------+ FV Prox  Full                                                           +---------+---------------+---------+-----------+----------+-----------------+ FV Mid   Full                                                           +---------+---------------+---------+-----------+----------+-----------------+ FV DistalFull                                                           +---------+---------------+---------+-----------+----------+-----------------+ PFV      Full                                                           +---------+---------------+---------+-----------+----------+-----------------+ POP      Full           Yes      Yes                                    +---------+---------------+---------+-----------+----------+-----------------+ PTV      Full                                                           +---------+---------------+---------+-----------+----------+-----------------+  PERO     Partial        Yes      Yes                  Age Indeterminate +---------+---------------+---------+-----------+----------+-----------------+     Summary: RIGHT: - No evidence of common femoral vein obstruction.  LEFT: - Findings consistent with age indeterminate deep vein thrombosis involving the left peroneal veins. - A cystic structure measuring 4.9 cm is found in the popliteal fossa.  *See table(s) above for measurements and observations. Electronically signed by Waverly Ferrarihristopher Dickson MD on 07/31/2020 at 7:31:22 AM.    Final              Medical Problem List and Plan: 1.  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  Completed 7-day course of Keppra for seizure prophylaxis.  Follow-up Dr. Conchita ParisNundkumar             -patient may   Shower if  radial splint cover             -ELOS/Goals: 17-20 days/ supervision goals with PT, SLP and sup/min OT 2.  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  No current plans for long-term anticoagulation due to risk of fall/SDH.  Follow-up vascular study while on rehab             -antiplatelet therapy: Lovenox 30 mg every 12 hours 3. Pain Management: Naprosyn 250 mg twice daily, Ultram as needed 4. Mood: Provide emotional support             -antipsychotic agents: N/A 5. Neuropsych: This patient is not capable of making decisions on her own behalf. 6. Skin/Wound Care: Routine skin checks 7. Fluids/Electrolytes/Nutrition: Routine in and outs with follow-up chemistries 8.  Right maxillary sinus, right lateral orbital wall and floor fractures with fat herniation.  Follow-up outpatient Dr Leta Baptisthimmappa.  No surgical intervention planned 9.  Left ICA stenosis.  50% noted.  Follow-up as outpatient. 10.  Hypertension.  Norvasc 5 mg daily.  Monitor as she mobilizes with PT/OT 11.  Incidental finding of abnormal appearing endometrium identified on MRI lumbar spine.  Follow-up outpatient OB/GYN 12.  Right distal comminuted distal radius fracture and minimally displaced radial head fracture.  No surgical intervention at this time follow-up outpatient Dr. Merlyn LotKuzma.               -Nonweightbearing with splint RUE.             -encourage pt to keep splint in place     Charlton AmorDaniel J Angiulli, PA-C 08/01/2020  I have personally performed a face to face diagnostic evaluation of this patient and formulated the key components of the plan.  Additionally, I have personally reviewed laboratory data, imaging studies, as well as relevant notes and concur with the physician assistant's documentation above.  The patient's status has not changed from the original H&P.  Any changes in documentation from the acute care chart have been noted above.  Ranelle OysterZachary T.  Roxas Clymer, MD, [place] DomFAAPMR

## 2020-08-02 NOTE — Progress Notes (Signed)
Inpatient Rehabilitation Medication Review by a Pharmacist  A complete drug regimen review was completed for this patient to identify any potential clinically significant medication issues.  Clinically significant medication issues were identified:  no  Check AMION for pharmacist assigned to patient if future medication questions/issues arise during this admission.  Pharmacist comments:   Time spent performing this drug regimen review (minutes):  15   Danielle Kirby 08/02/2020 8:15 PM

## 2020-08-02 NOTE — Progress Notes (Signed)
   Subjective/Chief Complaint: No complaints this morning. Up eating breakfast denies knee pain.  Objective: Vital signs in last 24 hours: Temp:  [97.9 F (36.6 C)-98.9 F (37.2 C)] 98.1 F (36.7 C) (04/28 0700) Pulse Rate:  [86-92] 90 (04/28 0000) Resp:  [13-18] 18 (04/28 0000) BP: (118-150)/(67-96) 122/96 (04/28 0700) SpO2:  [97 %-100 %] 97 % (04/28 0000) Last BM Date:  (PTA)  Intake/Output from previous day: 04/27 0701 - 04/28 0700 In: 720 [P.O.:720] Out: 600 [Urine:600] Intake/Output this shift: No intake/output data recorded.  Exam: Awake Follows commands CV RRR Resp clear, non labored Abdomen non-tender, +BS L knee swelling present, non-tender to palpation, swelling may be slightly improved compared to previous   BMET Recent Labs    07/30/20 0944  NA 135  K 3.8  CL 99  CO2 26  GLUCOSE 116*  BUN 7*  CREATININE 0.65  CALCIUM 9.5   PT/INR No results for input(s): LABPROT, INR in the last 72 hours. ABG No results for input(s): PHART, HCO3 in the last 72 hours.  Invalid input(s): PCO2, PO2  Studies/Results: No results found.  Anti-infectives: Anti-infectives (From admission, onward)   None      Assessment/Plan: . 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 Lorraine Lax sugar tong splint but patient keeps removing, Dr. 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 Conchita Paris, no follow up scan needed. Keppra x7d R maxillary sinus, R lateral orbital wall and floor fxs with fat herniation - Dr. Annette Stable seen. No acute surgical intervention. May follow up prn. Recommends outpatient ophthalmology exam Elevated Cr - normalized Abnormal appearing endometrium -can f/u with Gyn as o/p Hx heart murmur Memory deficit- unclear if this is new or not. May need to see neuro as  an outpatient for balance issues pending therapies as well as memory issues L ICA stenosis- 50% noted. Outpatient follow up L supraclinoid ICA aneurysm- incidental find, can follow up with Dr. Conchita Paris as outpatient. 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 L knee effusion - arthritis vs gout, continue ice and naproxen BID. L knee bakers cyst   VTE - SCDs, LMWH FEN -D3 diet per speech Foley -none Follow up -TBD Dispo - 4NP, continue naproxen and ice for L knee effusion/pain, medically stable for discharge to CIR.     LOS: 7 days    Danielle Kirby 08/02/2020

## 2020-08-02 NOTE — Progress Notes (Signed)
PMR Admission Coordinator Pre-Admission Assessment   Patient: Danielle Kirby is an 75 y.o., female MRN: [id number] DOB: 10/16/1945 Height: 5\' 7"  (170.2 cm) Weight: 83.6 kg                                                                                                                                                  Insurance Information HMO:     PPO:      PCP:      IPA:      80/20: yes     OTHER:  PRIMARY: Medicare A      Policy#: [id number]      Subscriber: patient CM Name:       Phone#:      Fax#:  Pre-Cert#:       Employer:  Benefits:  Phone #: verified eligibility online via [employer] on 07/29/20     Name:  Eff. 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      Policy#: [id number]     Phone#: [phone]   Financial Counselor:       Phone#:    The "Data Collection Information Summary" for patients in Inpatient Rehabilitation Facilities with attached "Privacy Act Statement-Health Care Records" was provided and verbally reviewed with: Family   Emergency Contact Information         Contact Information     Name Relation Home Work [employer]    Danielle Kirby, [place] Significant other     [phone]       Current Medical History  Patient Admitting Diagnosis: TBI History of Present Illness: Danielle GitelmanBrenda P. Kirby is a 75 year old right-handed female with unremarkable past medical history except cataracts and osteoarthritis.  Presented 07/26/2020 after reports of multiple falls.  The first fall was several days prior to admission where she fell down 4-6 stairs; she then  fell again in the bathroom and found by significant other unresponsive.  Admission chemistries unremarkable except potassium 3.3 creatinine 1.08 alcohol negative.  Cranial CT scan  showed a small right thalamocapsular hypodensity possibly representing age-indeterminate lacunar infarction.  Small 3 mm largely low-density left cerebral convexity subdural collection suspicious for subdural hematoma or hygroma likely in part chronic.  Right periorbital and premaxillary soft tissue contusion.  Fractures of walls of the right maxillary sinus with layering hemosinus.  Additional fractures of the right lateral orbital wall and orbital floor with herniation small amount of fat through the orbital floor defect and involvement of the infraorbital  foramen.  CT angiogram head and neck no emergent large vessel occlusion or hemodynamically significant proximal stenosis intracranially.  Approximately 50% stenosis of the proximal left ICA secondary to predominantly calcified atherosclerosis.  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  MRI of the brain showed no acute infarction.  Numerous small foci of susceptibility artifact within the posterior right temporal and right parietal occipital region without associated edema or hyperdensity compatible with prior microhemorrhages.  MRI lumbar spine showed mild multilevel degenerative change as well as incidental abnormal appearance of the endometrium partially imaged but suspicious for possible cancer and patient was to follow-up outpatient OB/GYN.  CT of the right wrist/elbow showed comminuted mildly impacted right intra-articular fracture of the distal radius, minimally displaced ulnar styloid fracture.  Follow-up neurosurgery regards to small amount of subdural blood as well as incidental left supraclinoid ICA aneurysm follow-up outpatient Dr. Conchita Paris.  Dr. Merlyn Lot follow-up for comminuted distal radius fracture minimally displaced radial head fracture recommend sugar-tong splint follow-up in 1 week there was some discussion of possible ORIF of distal radius.  Follow-up Dr. Leta Baptist in regards to facial  fractures nonoperative follow-up outpatient as well as recommendations of ophthalmology eye exam.  NWB RUE in a sling.  Venous Doppler studies 07/30/2020 showed age-indeterminate DVT left peroneal vein.  Given recurrent falls/SDH no current recommendations for anticoagulation advised and follow-up vascular study.  She was maintained on subcutaneous Lovenox.  Maintained on Keppra for seizure prophylaxis.  Patient did have episodes of agitation restlessness requiring Haldol.  Pt c/o severe L knee pain, workup negative, suspect gout? Therapy evaluations completed due to patient's traumatic brain injury was recommended for a comprehensive rehab program.    Complete NIHSS TOTAL: 2 Glasgow Coma Scale Score: 14   Past Medical History      Past Medical History:  Diagnosis Date  . Allergy    . Arthritis    . Cataract    . Heart murmur        Family History  family history includes Diabetes in her mother; Hypertension in her father.   Prior Rehab/Hospitalizations:  Has the patient had prior rehab or hospitalizations prior to admission? No   Has the patient had major surgery during 100 days prior to admission? No   Current Medications    Current Facility-Administered Medications:  .  acetaminophen (TYLENOL) tablet 1,000 mg, 1,000 mg, Oral, Q4H PRN, Diamantina Monks, MD, 1,000 mg at 07/31/20 0853 .  amLODipine (NORVASC) tablet 5 mg, 5 mg, Oral, Daily, Simaan, Elizabeth S, PA-C, 5 mg at 08/02/20 0819 .  chlorhexidine (PERIDEX) 0.12 % solution 15 mL, 15 mL, Mouth Rinse, BID, Violeta Gelinas, MD, 15 mL at 08/02/20 0819 .  Chlorhexidine Gluconate Cloth 2 % PADS 6 each, 6 each, Topical, Q0600, Violeta Gelinas, MD, 6 each at 08/02/20 0450 .  docusate sodium (COLACE) capsule 100 mg, 100 mg, Oral, BID, Jacinto Halim, PA-C, 100 mg at 08/02/20 9371 .  enoxaparin (LOVENOX) injection 30 mg, 30 mg, Subcutaneous, Q12H, Isabel Caprice, MD, 30 mg at 08/02/20 0818 .  haloperidol lactate (HALDOL) injection 2  mg, 2 mg, Intravenous, Q4H PRN, Diamantina Monks, MD, 2 mg at 07/28/20 2139 .  hydrALAZINE (APRESOLINE) injection 10 mg, 10 mg, Intravenous, Q4H PRN, Isabel Caprice, MD, 10 mg at 07/31/20 0853 .  levETIRAcetam (KEPPRA) tablet 500 mg, 500 mg, Oral, BID, Violeta Gelinas, MD, 500 mg at 08/02/20 0819 .  MEDLINE mouth rinse, 15 mL, Mouth Rinse,  Antony Blackbird, MD, 15 mL at 07/31/20 1628 .  metoprolol tartrate (LOPRESSOR) injection 5 mg, 5 mg, Intravenous, Q6H PRN, Jacinto Halim, PA-C, 5 mg at 07/28/20 1548 .  naproxen (NAPROSYN) tablet 250 mg, 250 mg, Oral, BID WC, Simaan, Francine Graven, PA-C, 250 mg at 08/02/20 4540 .  ondansetron (ZOFRAN-ODT) disintegrating tablet 4 mg, 4 mg, Oral, Q6H PRN **OR** ondansetron (ZOFRAN) injection 4 mg, 4 mg, Intravenous, Q6H PRN, Maczis, Elmer Sow, PA-C .  polyethylene glycol (MIRALAX / GLYCOLAX) packet 17 g, 17 g, Oral, Daily PRN, Maczis, Elmer Sow, PA-C .  sodium chloride flush (NS) 0.9 % injection 10-40 mL, 10-40 mL, Intracatheter, Q12H, Barnetta Chapel, PA-C, 10 mL at 08/02/20 0820 .  sodium chloride flush (NS) 0.9 % injection 10-40 mL, 10-40 mL, Intracatheter, PRN, Barnetta Chapel, PA-C .  traMADol Janean Sark) tablet 50 mg, 50 mg, Oral, Q6H PRN, Berna Bue, MD   Patients Current Diet:     Diet Order                      Diet regular Room service appropriate? Yes with Assist; Fluid consistency: Thin  Diet effective now                      Precautions / Restrictions Precautions Precautions: Fall Precaution Comments: RUE sling and sugar tong splint Restrictions Weight Bearing Restrictions: Yes RUE Weight Bearing: Non weight bearing    Has the patient had 2 or more falls or a fall with injury in the past year?Yes   Prior Activity Level Community (5-7x/wk): 3-4 days/week out of house. Pt drives   Prior Functional Level Prior Function Level of Independence: Independent Comments: Per pt's significant other, Pt was fully independent.   Drives. cleans, managed own finances and medications.  She worked for the [employer] for 30 years   Self Care: Did the patient need help bathing, dressing, using the toilet or eating?  Independent   Indoor Mobility: Did the patient need assistance with walking from room to room (with or without device)? Independent   Stairs: Did the patient need assistance with internal or external stairs (with or without device)? Independent   Functional Cognition: Did the patient need help planning regular tasks such as shopping or remembering to take medications? Independent   Home Assistive Devices / Equipment Home Assistive Devices/Equipment: None Home Equipment: None   Prior Device Use: Indicate devices/aids used by the patient prior to current illness, exacerbation or injury? None of the above   Current Functional Level Cognition   Arousal/Alertness: Lethargic Overall Cognitive Status: Impaired/Different from baseline Current Attention Level: Sustained Orientation Level: Oriented to person Following Commands: Follows one step commands inconsistently Safety/Judgement: Decreased awareness of deficits,Decreased awareness of safety General Comments: Pt drowsy today, able to arouse with min-mod stimulation. Follows 1-3 step commands i.e. reaching, therapeutic exercises, washing face with L hand, but has difficulty initiating with tasks such as bed mobility and attempts to stand. Attention: Focused Focused Attention: Impaired Focused Attention Impairment: Verbal basic,Functional basic Memory: Impaired Memory Impairment: Other (comment) (unable to assess secondary to very poor attention) Awareness: Impaired Awareness Impairment: Intellectual impairment Problem Solving: Impaired Problem Solving Impairment: Verbal basic,Functional basic Behaviors: Restless Safety/Judgment: Impaired [place] [address] Scales of Cognitive Functioning: Confused/inappropriate/non-agitated    Extremity  Assessment (includes Sensation/Coordination)   Upper Extremity Assessment: RUE deficits/detail,LUE deficits/detail RUE Deficits / Details: Pt has removed the sugartong splint and sling.  Sling reapplied with her tolerating it  fairly well.  Rt UE not formally assessed LUE Deficits / Details: WFL: LUE Sensation: WNL LUE Coordination: WNL  Lower Extremity Assessment: Defer to PT evaluation RLE Deficits / Details: Weakness noted with functional mobility with poor R leg advancement (poor weight shift to L though) RLE Coordination: decreased fine motor,decreased gross motor LLE Deficits / Details: Weakness noted through functional mobility with L knee buckling and decreased L weight shift/stance LLE Coordination: decreased fine motor,decreased gross motor     ADLs   Overall ADL's : Needs assistance/impaired Eating/Feeding: Minimal assistance,Sitting Grooming: Wash/dry hands,Wash/dry face,Oral care,Maximal assistance,Sitting Grooming Details (indicate cue type and reason): Pt very lethargic requiring hand over hand assist using LUE to wash face and complete mouth care. Upper Body Bathing: Moderate assistance,Sitting Lower Body Bathing: Sit to/from stand,Maximal assistance Upper Body Dressing : Maximal assistance,Sitting Lower Body Dressing: Maximal assistance,Sit to/from stand Toilet Transfer: Moderate assistance,+2 for physical assistance,+2 for safety/equipment,Stand-pivot,BSC Toileting- Clothing Manipulation and Hygiene: Total assistance,Sit to/from stand Functional mobility during ADLs: Maximal assistance,+2 for physical assistance,+2 for safety/equipment General ADL Comments: Pt max assist with most adls today. Pt very lethargic and not following many commands today. Nursing states pt did not sleep well.     Mobility   Overal bed mobility: Needs Assistance Bed Mobility: Supine to Sit,Sit to Supine Rolling: Max assist Supine to sit: Max assist,+2 for physical assistance Sit to supine:  Max assist,+2 for physical assistance General bed mobility comments: MaxA + 2 for initiationg and execution of supine <> sit     Transfers   Overall transfer level: Needs assistance Equipment used: 2 person hand held assist Transfers: Sit to/from Stand Sit to Stand: +2 physical assistance,Max assist Stand pivot transfers: Mod assist,+2 physical assistance,+2 safety/equipment General transfer comment: Unable to initiate to stand with totalA + 2, minimal hip clearance     Ambulation / Gait / Stairs / Wheelchair Mobility   Ambulation/Gait Ambulation/Gait assistance: Mod assist,+2 physical assistance,+2 safety/equipment Gait Distance (Feet): 4 Feet Assistive device: 2 person hand held assist Gait Pattern/deviations: Decreased step length - right,Decreased step length - left,Decreased stride length,Shuffle,Decreased weight shift to left,Decreased stance time - left,Trunk flexed General Gait Details: Pt with UE support with therapists on either side of her, cuing pt through visual, verbal, and tactile cues to take steps to L at EOB to recliner. Pt needing modAx2 to prevent her R lateral lean, facilitate weight shifting to L, and facilitate bil leg advancement. L knee buckling noted, needing intermittent blocking. Gait velocity: reduced Gait velocity interpretation: <1.31 ft/sec, indicative of household ambulator     Posture / Balance Balance Overall balance assessment: Needs assistance Sitting-balance support: Feet supported Sitting balance-Leahy Scale: Good Standing balance support: Single extremity supported,During functional activity Standing balance-Leahy Scale: Poor Standing balance comment: required assist +2 for static standing     Special needs/care consideration Behavioral consideration TBI        Previous Home Environment (from acute therapy documentation) Living Arrangements: Spouse/significant other  Lives With: Significant other Available Help at Discharge: Family,Available  24 hours/day Type of Home: House Home Layout: Other (Comment) (split level house; basement on lower level/bedroom on upper level) Alternate Level Stairs-Number of Steps: 2 steps Home Access: Stairs to enter Entrance Stairs-Rails: Right,Left,Can reach both Entrance Stairs-Number of Steps: 6-7 Bathroom Shower/Tub: Engineer, manufacturing systems: Standard Bathroom Accessibility: Yes How Accessible: Accessible via walker Home Care Services: No Additional Comments: Pt lives with her significant other   Discharge Living Setting Plans for Discharge Living Setting: Patient's home Type of  Home at Discharge: House Discharge Home Layout: Other (Comment) (split level house: basement on lower level, bedroom on upper level) Discharge Home Access: Stairs to enter Entrance Stairs-Rails: Right,Left,Can reach both Entrance Stairs-Number of Steps: 6-7 Discharge Bathroom Shower/Tub: Tub/shower unit Discharge Bathroom Toilet: Standard Discharge Bathroom Accessibility: Yes How Accessible: Accessible via walker Does the patient have any problems obtaining your medications?: No   Social/Family/Support Systems Anticipated Caregiver: Marliss Coots, significant other Anticipated Caregiver's Contact Information: [phone] Caregiver Availability: 24/7 Discharge Plan Discussed with Primary Caregiver: Yes Is Caregiver In Agreement with Plan?: Yes Does Caregiver/Family have Issues with Lodging/Transportation while Pt is in Rehab?: No     Goals Patient/Family Goal for Rehab: Supervision: PT/ST, Supervision-Min A: OT Expected length of stay: 17-20 days Pt/Family Agrees to Admission and willing to participate: Yes Program Orientation Provided & Reviewed with Pt/Caregiver Including Roles  & Responsibilities: Yes     Decrease burden of Care through IP rehab admission: n/a   Possible need for SNF placement upon discharge: Potentially.    Patient Condition: This patient's medical and functional status has  changed since the consult dated: 07/30/2020 in which the Rehabilitation Physician determined and documented that the patient's condition is appropriate for intensive rehabilitative care in an inpatient rehabilitation facility. See "History of Present Illness" (above) for medical update. Functional changes are: pt total assist with all mobility/ADLs. Patient's medical and functional status update has been discussed with the Rehabilitation physician and patient remains appropriate for inpatient rehabilitation. Will admit to inpatient rehab today.   Preadmission Screen Completed By:  Stephania Fragmin, PT, 08/02/2020 11:47 AM ______________________________________________________________________   Discussed status with Dr. Riley Kill on 08/02/20 at 11:51 AM  and received approval for admission today.   Admission Coordinator:  Stephania Fragmin, PT, DPT time 11:51 AM Dorna Bloom 08/02/20              Cosigned by: Ranelle Oyster, MD at 08/02/2020 12:19 PM

## 2020-08-03 ENCOUNTER — Inpatient Hospital Stay (HOSPITAL_COMMUNITY): Payer: Medicare Other

## 2020-08-03 ENCOUNTER — Encounter (HOSPITAL_COMMUNITY): Payer: Federal, State, Local not specified - PPO

## 2020-08-03 LAB — COMPREHENSIVE METABOLIC PANEL
ALT: 21 U/L (ref 0–44)
AST: 28 U/L (ref 15–41)
Albumin: 2.8 g/dL — ABNORMAL LOW (ref 3.5–5.0)
Alkaline Phosphatase: 160 U/L — ABNORMAL HIGH (ref 38–126)
Anion gap: 9 (ref 5–15)
BUN: 12 mg/dL (ref 8–23)
CO2: 28 mmol/L (ref 22–32)
Calcium: 10.1 mg/dL (ref 8.9–10.3)
Chloride: 99 mmol/L (ref 98–111)
Creatinine, Ser: 0.62 mg/dL (ref 0.44–1.00)
GFR, Estimated: >60 mL/min (ref >60)
Glucose, Bld: 104 mg/dL — ABNORMAL HIGH (ref 70–99)
Potassium: 3.5 mmol/L (ref 3.5–5.1)
Sodium: 136 mmol/L (ref 135–145)
Total Bilirubin: 0.6 mg/dL (ref 0.3–1.2)
Total Protein: 6.9 g/dL (ref 6.5–8.1)

## 2020-08-03 LAB — CBC WITH DIFFERENTIAL/PLATELET
Abs Immature Granulocytes: 0.02 10*3/uL (ref 0.00–0.07)
Basophils Absolute: 0 10*3/uL (ref 0.0–0.1)
Basophils Relative: 1 %
Eosinophils Absolute: 0.2 10*3/uL (ref 0.0–0.5)
Eosinophils Relative: 4 %
HCT: 35.8 % — ABNORMAL LOW (ref 36.0–46.0)
Hemoglobin: 11.1 g/dL — ABNORMAL LOW (ref 12.0–15.0)
Immature Granulocytes: 0 %
Lymphocytes Relative: 18 %
Lymphs Abs: 1 10*3/uL (ref 0.7–4.0)
MCH: 27 pg (ref 26.0–34.0)
MCHC: 31 g/dL (ref 30.0–36.0)
MCV: 87.1 fL (ref 80.0–100.0)
Monocytes Absolute: 0.5 10*3/uL (ref 0.1–1.0)
Monocytes Relative: 10 %
Neutro Abs: 3.7 10*3/uL (ref 1.7–7.7)
Neutrophils Relative %: 67 %
Platelets: 280 10*3/uL (ref 150–400)
RBC: 4.11 MIL/uL (ref 3.87–5.11)
RDW: 14.1 % (ref 11.5–15.5)
WBC: 5.5 10*3/uL (ref 4.0–10.5)
nRBC: 0 % (ref 0.0–0.2)

## 2020-08-03 MED ORDER — ENSURE ENLIVE PO LIQD
237.0000 mL | Freq: Two times a day (BID) | ORAL | Status: DC
  Administered 2020-08-03 – 2020-08-29 (×35): 237 mL via ORAL
  Filled 2020-08-03 (×3): qty 237

## 2020-08-03 MED ORDER — TRAMADOL HCL 50 MG PO TABS
50.0000 mg | ORAL_TABLET | Freq: Three times a day (TID) | ORAL | Status: DC | PRN
  Filled 2020-08-03: qty 1

## 2020-08-03 MED ORDER — LEVETIRACETAM 500 MG PO TABS
500.0000 mg | ORAL_TABLET | Freq: Once | ORAL | Status: AC
  Administered 2020-08-03: 500 mg via ORAL
  Filled 2020-08-03: qty 1

## 2020-08-03 MED ORDER — AMLODIPINE BESYLATE 10 MG PO TABS
10.0000 mg | ORAL_TABLET | Freq: Every day | ORAL | Status: DC
  Administered 2020-08-04 – 2020-08-21 (×17): 10 mg via ORAL
  Filled 2020-08-03 (×19): qty 1

## 2020-08-03 MED ORDER — ADULT MULTIVITAMIN W/MINERALS CH
1.0000 | ORAL_TABLET | Freq: Every day | ORAL | Status: DC
  Administered 2020-08-04 – 2020-08-29 (×25): 1 via ORAL
  Filled 2020-08-03 (×27): qty 1

## 2020-08-03 NOTE — Progress Notes (Signed)
Patient ID: Danielle Kirby, female   DOB: 21-Sep-1945, 75 y.o.   MRN: [id number] Met with the patient to introduce self and role of the nurse CM. Patient is alert to self but tangential and hard to get refocused on tasks. Able to state her age however memory impaired and retaining education at present for the patient is limited at present. Handouts left at the bedside for the patient's family and will continue to follow along to discharge to address educational needs and collaborate with the SW to facilitate preparation for discharge. Margarito Liner

## 2020-08-03 NOTE — Progress Notes (Signed)
Initial Nutrition Assessment  DOCUMENTATION CODES:   Not applicable  INTERVENTION:   - Please obtain CIR admission weight  - Ensure Enlive po BID, each supplement provides 350 kcal and 20 grams of protein  - MVI with minerals daily  NUTRITION DIAGNOSIS:   Increased nutrient needs related to other (TBI) as evidenced by estimated needs.  GOAL:   Patient will meet greater than or equal to 90% of their needs  MONITOR:   PO intake,Supplement acceptance,Weight trends  REASON FOR ASSESSMENT:   Malnutrition Screening Tool    ASSESSMENT:   75 year old female with PMH of cataracts and osteoarthritis. Presented 07/26/20 after reports of multiple falls. Pt found to have SDH and facial fractures. Pt also with right distal radius fracture and minimally displaced radial head fracture. Admitted to CIR on 4/28.   Spoke with pt and significant other at bedside. Pt distracted and unable to answer most questions appropriate. Pt picking at safety belt and arm sling and repeatedly asking about her dinner order.  When asked if she has a good appetite, pt states, "I guess." No meal completions charted at this time.  Spoke with significant other who reports pt had a fair appetite PTA. Pt enjoyed fruits and vegetables but did not eat many "heavy foods." Pt's significant other does not think that pt has lost any weight but is unsure.  Reviewed weight history in chart. Pt with a 3.5 kg weight loss since 06/04/20. This is a 4% weight loss in 2 months which is not significant for timeframe. No CIR admission weight recorded.  Pt amenable to trying Ensure Enlive supplements. RD will also order daily MVI.  Medications reviewed and include: colace  Labs reviewed.  NUTRITION - FOCUSED PHYSICAL EXAM:  Flowsheet Row Most Recent Value  Orbital Region Mild depletion  Upper Arm Region No depletion  Thoracic and Lumbar Region No depletion  Buccal Region No depletion  Temple Region No depletion   Clavicle Bone Region Mild depletion  Clavicle and Acromion Bone Region Mild depletion  Scapular Bone Region No depletion  Dorsal Hand No depletion  Patellar Region No depletion  Anterior Thigh Region No depletion  Posterior Calf Region No depletion  Edema (RD Assessment) Mild  [BLE]  Hair Reviewed  Eyes Reviewed  Mouth Reviewed  Skin Reviewed  Nails Reviewed       Diet Order:   Diet Order            Diet regular Room service appropriate? Yes with Assist; Fluid consistency: Thin  Diet effective now                 EDUCATION NEEDS:   No education needs have been identified at this time  Skin:  Skin Assessment: Reviewed RN Assessment  Last BM:  no documented BM  Height:   Ht Readings from Last 1 Encounters:  07/26/20 5\' 7"  (1.702 m)    Weight:   Wt Readings from Last 1 Encounters:  07/26/20 83.6 kg    BMI:  28.86  Estimated Nutritional Needs:   Kcal:  1700-1900  Protein:  85-100 grams  Fluid:  1.7 L/day    07/28/20, MS, RD, LDN Inpatient Clinical Dietitian Please see [facility] for contact information.

## 2020-08-03 NOTE — Progress Notes (Signed)
Inpatient Rehabilitation Care Coordinator Assessment and Plan Patient Details  Name: Danielle Kirby MRN: [id number] Date of Birth: 03/20/1946  Today's Date: 08/03/2020  Hospital Problems: Principal Problem:   TBI (traumatic brain injury) [facility])  Past Medical History:  Past Medical History:  Diagnosis Date  . Allergy   . Arthritis   . Cataract   . Heart murmur    Past Surgical History:  Past Surgical History:  Procedure Laterality Date  . BREAST SURGERY    . COSMETIC SURGERY    . EYE SURGERY    . TUBAL LIGATION     Social History:  reports that she has never smoked. She has never used smokeless tobacco. She reports that she does not drink alcohol and does not use drugs.  Family / Support Systems Marital Status: Divorced Patient Roles: Partner Spouse/Significant Other: [name] [phone] Children: Pt has three children and [name] has three children Other Supports: Extended family Anticipated Caregiver: [name] Ability/Limitations of Caregiver: None he is retired Medical laboratory scientific officer: 24/7 Family Dynamics: Close with partner and children along with grandchildren. 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 Since [name] is a partner her next of kin is her childred, [name] to get their numbers to place in chart   Abuse/Neglect Abuse/Neglect Assessment Can Be Completed: Yes Physical Abuse: Denies Verbal Abuse: Denies Sexual Abuse: Denies Exploitation of patient/patient's resources: Denies Self-Neglect: Denies  Emotional Status Pt's affect, behavior and adjustment status: Pt is  confused and not able to participate in interview, information obtained from [place]. He reports she is very independent and did everything for herself before the fall. He voiced she is stubborn and hard headed and will push others back if feels she does not want to do something Recent Psychosocial Issues: healthy prior to admission Psychiatric History: No history deferred depression screen due to confused and BI. Do feel once appropriate would benefit from seeing neruo-psych while here Substance Abuse History: No issues  Patient / Family Perceptions, Expectations & Goals Pt/Family understanding of illness & functional limitations: Pt not sure why in the hospital, [name] can explain her injuries and is hopeful she will do well here. He has spoken with the MD and aware of her BI. He hopes she will progress and come around while here Premorbid pt/family roles/activities: Partner, mom, grandmother, retiree, etc Anticipated changes in roles/activities/participation: resume Pt/family expectations/goals: Pt states: " Why am I here, my arm?"  [name] states: " I hope she improves and is more alert and less confused."  [employer]: None Premorbid Home Care/DME Agencies: None Transportation available at discharge: Pt drove prior to admission now will rely upon [employer] referrals recommended: Neuropsychology  Discharge Planning Living Arrangements: Spouse/significant other Support Systems: Spouse/significant other,Children,Other relatives,Friends/neighbors Type of Residence: Engineer, materials residence Civil engineer, contracting: [employer] (specify) (Fed Acupuncturist) Surveyor, quantity Resources: Engineer, maintenance (IT) (Comment) Financial Screen Referred: No Living Expenses: Own Money Management: Patient,Significant Other Does the patient have any problems obtaining your medications?: No Home Management: Both mostly pt Patient/Family Preliminary Plans: Return home with [facility] providing  assist, he hopes she will be much improved by this time. Aware team evaluating pt today. [name] is retired and able to provide 24/7 care. Care Coordinator Barriers to  Discharge: Insurance for SNF coverage Care Coordinator Anticipated Follow Up Needs: HH/OP  Clinical Impression Pleasantly confused patient who is unsure reason she is here. [name]-partner of 15 years is supportive and involved and hopeful she will improve while here. Pt has three grown children who visit and are supportive. Pt has no coverage to go to a SNF form here, made both aware of this. Will need to get children's contact information since they are her next of kin and pt is not capable of making her own decisions at this time.  Lucy Chris 08/03/2020, 11:15 AM

## 2020-08-03 NOTE — Plan of Care (Signed)
Problem: RH Balance Goal: LTG: Patient will maintain dynamic sitting balance (OT) Description: LTG:  Patient will maintain dynamic sitting balance with assistance during activities of daily living (OT) Flowsheets (Taken 08/03/2020 1631) LTG: Pt will maintain dynamic sitting balance during ADLs with: Supervision/Verbal cueing Goal: LTG Patient will maintain dynamic standing with ADLs (OT) Description: LTG:  Patient will maintain dynamic standing balance with assist during activities of daily living (OT)  Flowsheets (Taken 08/03/2020 1631) LTG: Pt will maintain dynamic standing balance during ADLs with: Minimal Assistance - Patient > 75%   Problem: Sit to Stand Goal: LTG:  Patient will perform sit to stand in prep for activites of daily living with assistance level (OT) Description: LTG:  Patient will perform sit to stand in prep for activites of daily living with assistance level (OT) Flowsheets (Taken 08/03/2020 1631) LTG: PT will perform sit to stand in prep for activites of daily living with assistance level: Minimal Assistance - Patient > 75%   Problem: RH Eating Goal: LTG Patient will perform eating w/assist, cues/equip (OT) Description: LTG: Patient will perform eating with assist, with/without cues using equipment (OT) Flowsheets (Taken 08/03/2020 1631) LTG: Pt will perform eating with assistance level of: Minimal Assistance - Patient > 75%   Problem: RH Grooming Goal: LTG Patient will perform grooming w/assist,cues/equip (OT) Description: LTG: Patient will perform grooming with assist, with/without cues using equipment (OT) Flowsheets (Taken 08/03/2020 1631) LTG: Pt will perform grooming with assistance level of: Minimal Assistance - Patient > 75%   Problem: RH Bathing Goal: LTG Patient will bathe all body parts with assist levels (OT) Description: LTG: Patient will bathe all body parts with assist levels (OT) Flowsheets (Taken 08/03/2020 1631) LTG: Pt will perform bathing with  assistance level/cueing: Minimal Assistance - Patient > 75%   Problem: RH Dressing Goal: LTG Patient will perform upper body dressing (OT) Description: LTG Patient will perform upper body dressing with assist, with/without cues (OT). Flowsheets (Taken 08/03/2020 1631) LTG: Pt will perform upper body dressing with assistance level of: Minimal Assistance - Patient > 75% Goal: LTG Patient will perform lower body dressing w/assist (OT) Description: LTG: Patient will perform lower body dressing with assist, with/without cues in positioning using equipment (OT) Flowsheets (Taken 08/03/2020 1631) LTG: Pt will perform lower body dressing with assistance level of: Minimal Assistance - Patient > 75%   Problem: RH Toileting Goal: LTG Patient will perform toileting task (3/3 steps) with assistance level (OT) Description: LTG: Patient will perform toileting task (3/3 steps) with assistance level (OT)  Flowsheets (Taken 08/03/2020 1631) LTG: Pt will perform toileting task (3/3 steps) with assistance level: Minimal Assistance - Patient > 75%   Problem: RH Functional Use of Upper Extremity Goal: LTG Patient will use RT/LT upper extremity as a (OT) Description: LTG: Patient will use right/left upper extremity as a stabilizer/gross assist/diminished/nondominant/dominant level with assist, with/without cues during functional activity (OT) Flowsheets (Taken 08/03/2020 1631) LTG: Use of upper extremity in functional activities: RUE as a stabilizer LTG: Pt will use upper extremity in functional activity with assistance level of: Minimal Assistance - Patient > 75%   Problem: RH Toilet Transfers Goal: LTG Patient will perform toilet transfers w/assist (OT) Description: LTG: Patient will perform toilet transfers with assist, with/without cues using equipment (OT) Flowsheets (Taken 08/03/2020 1631) LTG: Pt will perform toilet transfers with assistance level of: Minimal Assistance - Patient > 75%   Problem: RH  Tub/Shower Transfers Goal: LTG Patient will perform tub/shower transfers w/assist (OT) Description: LTG: Patient will perform  tub/shower transfers with assist, with/without cues using equipment (OT) Flowsheets (Taken 08/03/2020 1631) LTG: Pt will perform tub/shower stall transfers with assistance level of: Minimal Assistance - Patient > 75%   Problem: RH Awareness Goal: LTG: Patient will demonstrate awareness during functional activites type of (OT) Description: LTG: Patient will demonstrate awareness during functional activites type of (OT) Flowsheets (Taken 08/03/2020 1631) LTG: Patient will demonstrate awareness during functional activites type of (OT): Minimal Assistance - Patient > 75%

## 2020-08-03 NOTE — Evaluation (Signed)
Occupational Therapy Assessment and Plan  Patient Details  Name: Danielle Kirby MRN: [id number] Date of Birth: Oct 30, 1945  OT Diagnosis: apraxia, ataxia, cognitive deficits and muscle weakness (generalized) Rehab Potential: Rehab Potential (ACUTE ONLY): Good ELOS: 4 weeks   Today's Date: 08/03/2020 OT Individual Time: 1300-1400 OT Individual Time Calculation (min): 60 min     Hospital Problem: Principal Problem:   TBI (traumatic brain injury) ([place])   Past Medical History:  Past Medical History:  Diagnosis Date  . Allergy   . Arthritis   . Cataract   . Heart murmur    Past Surgical History:  Past Surgical History:  Procedure Laterality Date  . BREAST SURGERY    . COSMETIC SURGERY    . EYE SURGERY    . TUBAL LIGATION      Assessment & Plan Clinical Impression: Patient is a 75 y.o. year old female with recent admission to the hospital on 4/21 after multiple falls.  First fall was several days PTA where she fell down 4-6 stairs.  Then fell fell in the bathroom and was found by significant other unresponsive.  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  Fx of all walls of the Rt maxillary sinus with layering hemosinus.  Additional fractures of the Rt lateral orbital wall and orbital floor with herniation of small amount of fat throgh orbital floor.  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  She was also found to have Rt wrist/elbow fx (NWB Rt UE).  PMH includes: cataracts, arthritis.  Patient transferred to CIR on 08/02/2020 .    Patient currently requires total with basic self-care skills secondary to muscle weakness, decreased cardiorespiratoy endurance, impaired timing and sequencing, unbalanced muscle activation, motor apraxia, ataxia, decreased coordination and decreased motor planning, decreased motor planning, decreased initiation, decreased attention,  decreased awareness, decreased problem solving, decreased safety awareness, decreased memory and delayed processing and decreased sitting balance, decreased standing balance, decreased postural control and decreased balance strategies.  Prior to hospitalization, patient could complete BADL with independent .  Patient will benefit from skilled intervention to increase independence with basic self-care skills prior to discharge home with care partner.  Anticipate patient will require minimal physical assistance and follow up home health.  OT - End of Session Endurance Deficit: Yes Endurance Deficit Description: Rest breaks within BADL tasks OT Assessment Rehab Potential (ACUTE ONLY): Good OT Patient demonstrates impairments in the following area(s): Balance;Behavior;Cognition;Endurance;Motor;Nutrition;Pain;Perception;Sensory;Safety;Vision OT Basic ADL's Functional Problem(s): Eating;Grooming;Bathing;Dressing;Toileting OT Transfers Functional Problem(s): Toilet;Tub/Shower OT Additional Impairment(s): Fuctional Use of Upper Extremity OT Plan OT Intensity: Minimum of 1-2 x/day, 45 to 90 minutes OT Frequency: 5 out of 7 days OT Duration/Estimated Length of Stay: 4 weeks OT Treatment/Interventions: Balance/vestibular training;Cognitive remediation/compensation;Community reintegration;Discharge planning;Disease mangement/prevention;Functional electrical stimulation;DME/adaptive equipment instruction;Functional mobility training;Neuromuscular re-education;Pain management;Patient/family education;Psychosocial support;Skin care/wound managment;Self Care/advanced ADL retraining;Therapeutic Activities;Splinting/orthotics;UE/LE Strength taining/ROM;Therapeutic Exercise;UE/LE Coordination activities;Visual/perceptual remediation/compensation;Wheelchair propulsion/positioning OT Self Feeding Anticipated Outcome(s): Min A OT Basic Self-Care Anticipated Outcome(s): Min A OT Toileting Anticipated Outcome(s): Min  A OT Bathroom Transfers Anticipated Outcome(s): Min A OT Recommendation Patient destination: Home Follow Up Recommendations: Home health OT Equipment Recommended: To be determined Equipment Details: may need a tub transfer bench   OT Evaluation Precautions/Restrictions  Precautions Precautions: Fall Precaution Comments: RUE sling and sugar tong splint Required Braces or Orthoses: Sling Restrictions Weight Bearing Restrictions: Yes RUE Weight Bearing: Non weight bearing Pain  denies pain Home Living/Prior Functioning Home Living  Family/patient expects to be discharged to:: Private residence Living Arrangements: Spouse/significant other Available Help at Discharge: Family,Available 24 hours/day Type of Home: House Home Access: Stairs to enter [employer] of Steps: 6-7 Entrance Stairs-Rails: Right,Left,Can reach both Home Layout: Multi-level Alternate Level Stairs-Number of Steps: 2 steps Bathroom Shower/Tub: Chiropodist: Standard Bathroom Accessibility: Yes Additional Comments: Pt lives with her significant other  Lives With: Significant other IADL History Homemaking Responsibilities: Yes Education: HS grad IADL Comments: Active and independent, did her own online banking, likeed Teacher, early years/pre, Driving, cooking Prior Function Level of Independence: Independent with basic ADLs,Independent with homemaking with ambulation Driving: Yes Vocation: Retired (worked at [employer]) Vision Baseline Vision/History: Wears glasses Vision Assessment?: Vision impaired- to be further tested in functional context Retail buyer Comments: Impaired, poor motor planning Cognition Overall Cognitive Status: Impaired/Different from baseline Arousal/Alertness: Lethargic Orientation Level: Person;Place;Situation Person: Oriented Place: Disoriented Situation: Disoriented Year: Other (Comment) (unable to state) Month:  (Unable to state) Day of Week:  Correct Memory: Impaired Memory Impairment: Decreased short term memory;Storage deficit Immediate Memory Recall: Sock;Blue;Bed Memory Recall Sock: Not able to recall Memory Recall Blue: Not able to recall Memory Recall Bed: Not able to recall Attention: Focused;Sustained Focused Attention: Impaired Focused Attention Impairment: Verbal basic;Functional basic Sustained Attention: Impaired Sustained Attention Impairment: Verbal basic;Functional basic Awareness: Impaired Awareness Impairment: Intellectual impairment Problem Solving: Impaired Problem Solving Impairment: Verbal basic;Functional basic Executive Function:  (all impaired due to lower level deficits) Behaviors: Perseveration;Impulsive Safety/Judgment: Impaired [employer] Scales of Cognitive Functioning: Confused/inappropriate/non-agitated Sensation Coordination Gross Motor Movements are Fluid and Coordinated: No Fine Motor Movements are Fluid and Coordinated: No Coordination and Movement Description: decreased smoothness and accuracy Motor  Motor Motor: Motor apraxia Motor - Skilled Clinical Observations: limited mobility due to decreased initiation, poor planning, slow processing  Trunk/Postural Assessment  Cervical Assessment Cervical Assessment: Exceptions to [facility] (forwad head) Thoracic Assessment Thoracic Assessment:  (rounded shoulders) Lumbar Assessment Lumbar Assessment: Within Functional Limits Postural Control Postural Control: Deficits on evaluation Protective Responses: delayed  Balance Balance Balance Assessed: Yes Static Sitting Balance Static Sitting - Balance Support: Feet supported;No upper extremity supported Static Sitting - Level of Assistance: 5: Stand by assistance Dynamic Sitting Balance Dynamic Sitting - Balance Support: Feet supported Dynamic Sitting - Level of Assistance: 4: Min assist Static Standing Balance Static Standing - Level of Assistance: 1: +2 Total  assist Extremity/Trunk Assessment RUE Assessment RUE Assessment: Exceptions to [facility] General Strength Comments: R UE limited wirst/hand with sugar tong splint and Ace wrap, NWB R UE LUE Assessment General Strength Comments: 4/5 generalized weakness  Care Tool Care Tool Self Care Eating   Eating Assist Level: Maximal Assistance - Patient 25 - 49%    Oral Care    Oral Care Assist Level: Maximal assistance - Patient 25 - 49%    Bathing   Body parts bathed by patient: Chest Body parts bathed by helper: Right arm;Left arm;Abdomen;Front perineal area;Buttocks;Left upper leg;Right upper leg;Left lower leg;Right lower leg;Face   Assist Level: 2 Helpers    Upper Body Dressing(including orthotics)   What is the patient wearing?: Pull over shirt   Assist Level: Maximal Assistance - Patient 25 - 49%    Lower Body Dressing (excluding footwear)   What is the patient wearing?: Pants Assist for lower body dressing: 2 Helpers    Putting on/Taking off footwear   What is the patient wearing?: Non-skid slipper socks Assist for footwear: Dependent - Patient 0%  Care Tool Toileting Toileting activity   Assist for toileting: Total Assistance - Patient < 25%      Care Tool Transfers Sit to stand transfer   Sit to stand assist level: 2 Helpers    Chair/bed transfer   Chair/bed transfer assist level: 2 Helpers     Toilet transfer   Assist Level: 2 Helpers     Care Tool Cognition Expression of Ideas and Wants Expression of Ideas and Wants: Some difficulty - exhibits some difficulty with expressing needs and ideas (e.g, some words or finishing thoughts) or speech is not clear   Understanding Verbal and Non-Verbal Content Understanding Verbal and Non-Verbal Content: Sometimes understands - understands only basic conversations or simple, direct phrases. Frequently requires cues to understand   Memory/Recall Ability *first 3 days only Memory/Recall Ability *first 3 days only: None of  the above were recalled    Refer to Care Plan for Long Term Goals  SHORT TERM GOAL WEEK 1 OT Short Term Goal 1 (Week 1): Patient will complete toilet transfer with max A +2 OT Short Term Goal 2 (Week 1): Patient will complete 1 step of UB dressing with max verbal cues OT Short Term Goal 3 (Week 1): Patient will complete sit<> stand with max A +2 in prep for BADL tasks.  Recommendations for other services: None    Skilled Therapeutic Intervention OT eval completed addressing rehab process, OT purpose, POC, ELOS, and goals. Pt greeted seated in recliner with son and boyfriend present. Pt brought to the sink in recliner for BADL tasks. Pt perseverating on finding her robe and needed max cues to try to stay on task. Pt with very poor initiation and needed hand over hand A for all BADL tasks. Pt disoriented x4 with poor memory and recall throughout session. Attempted sit<>stand at the sink with total A +2, but once she powered up, she was able to maintain standing for ~20 seconds, but not long enough to assist with LB ADLs. Pt brought to EOB in drop arm recliner and needed total A +2 squat-pivot back to bed. Pt returned to supine with total A. Rolling L and R for total A peri-care and brief change. See below for further details on BADL performance. Pt left semi-reclined in bed with handoff to transport for x-ray.  ADL ADL Eating: Maximal assistance Grooming: Maximal assistance Upper Body Bathing: Maximal assistance Lower Body Bathing: Dependent Upper Body Dressing: Maximal assistance Lower Body Dressing: Dependent Toileting: Not assessed Toilet Transfer: Other (comment) (Total A+2) Mobility  Bed Mobility Sit to Supine: 2 Helpers Transfers Sit to Stand: 2 Helpers   Discharge Criteria: Patient will be discharged from OT if patient refuses treatment 3 consecutive times without medical reason, if treatment goals not met, if there is a change in medical status, if patient makes no progress  towards goals or if patient is discharged from hospital.  The above assessment, treatment plan, treatment alternatives and goals were discussed and mutually agreed upon: by patient and by family  Valma Cava 08/03/2020, 4:28 PM

## 2020-08-03 NOTE — Plan of Care (Signed)
  Problem: RH Balance Goal: LTG Patient will maintain dynamic sitting balance (PT) Description: LTG:  Patient will maintain dynamic sitting balance with assistance during mobility activities (PT) Flowsheets (Taken 08/03/2020 1732) LTG: Pt will maintain dynamic sitting balance during mobility activities with:: Supervision/Verbal cueing Goal: LTG Patient will maintain dynamic standing balance (PT) Description: LTG:  Patient will maintain dynamic standing balance with assistance during mobility activities (PT) Flowsheets (Taken 08/03/2020 1732) LTG: Pt will maintain dynamic standing balance during mobility activities with:: Supervision/Verbal cueing   Problem: Sit to Stand Goal: LTG:  Patient will perform sit to stand with assistance level (PT) Description: LTG:  Patient will perform sit to stand with assistance level (PT) Flowsheets (Taken 08/03/2020 1732) LTG: PT will perform sit to stand in preparation for functional mobility with assistance level: Supervision/Verbal cueing   Problem: RH Bed Mobility Goal: LTG Patient will perform bed mobility with assist (PT) Description: LTG: Patient will perform bed mobility with assistance, with/without cues (PT). Flowsheets (Taken 08/03/2020 1732) LTG: Pt will perform bed mobility with assistance level of: Independent   Problem: RH Bed to Chair Transfers Goal: LTG Patient will perform bed/chair transfers w/assist (PT) Description: LTG: Patient will perform bed to chair transfers with assistance (PT). Flowsheets (Taken 08/03/2020 1732) LTG: Pt will perform Bed to Chair Transfers with assistance level: Supervision/Verbal cueing   Problem: RH Car Transfers Goal: LTG Patient will perform car transfers with assist (PT) Description: LTG: Patient will perform car transfers with assistance (PT). Flowsheets (Taken 08/03/2020 1732) LTG: Pt will perform car transfers with assist:: Contact Guard/Touching assist   Problem: RH Ambulation Goal: LTG Patient will  ambulate in controlled environment (PT) Description: LTG: Patient will ambulate in a controlled environment, # of feet with assistance (PT). Flowsheets (Taken 08/03/2020 1732) LTG: Pt will ambulate in controlled environ  assist needed:: Contact Guard/Touching assist LTG: Ambulation distance in controlled environment: 150' w/ LRAD Goal: LTG Patient will ambulate in home environment (PT) Description: LTG: Patient will ambulate in home environment, # of feet with assistance (PT). Flowsheets (Taken 08/03/2020 1732) LTG: Pt will ambulate in home environ  assist needed:: Contact Guard/Touching assist LTG: Ambulation distance in home environment: 42' w/ LRAD   Problem: RH Wheelchair Mobility Goal: LTG Patient will propel w/c in controlled environment (PT) Description: LTG: Patient will propel wheelchair in controlled environment, # of feet with assist (PT) Flowsheets (Taken 08/03/2020 1732) LTG: Pt will propel w/c in controlled environ  assist needed:: Supervision/Verbal cueing LTG: Propel w/c distance in controlled environment: 150'   Problem: RH Stairs Goal: LTG Patient will ambulate up and down stairs w/assist (PT) Description: LTG: Patient will ambulate up and down # of stairs with assistance (PT) Flowsheets (Taken 08/03/2020 1732) LTG: Pt will ambulate up/down stairs assist needed:: Contact Guard/Touching assist LTG: Pt will  ambulate up and down number of stairs: 8 for accessing home with railings   Problem: RH Attention Goal: LTG Patient will demonstrate this level of attention during functional activites (PT) Description: LTG:  Patient will demonstrate this level of attention during functional activites (PT) Flowsheets (Taken 08/03/2020 1732) Patient will demonstrate this level of attention during functional activites: Selective Patient will demonstrate above attention level in the following environment: Controlled LTG: Patient will demonstrate attention during functional mobility with  assistance of: Supervision  Sheran Lawless, PT

## 2020-08-03 NOTE — Progress Notes (Signed)
Patient information reviewed and entered into [facility] by Becky Martavia Tye, PPS coordinator. Information including medical coding, function ability, and quality indicators will be reviewed and updated through discharge.   

## 2020-08-03 NOTE — Progress Notes (Signed)
PA Reuel Boom notified about patient refusing her Keppra last night, ordered to give her last dose today instead. Order carried out. Lovenox given. Tolerated well.

## 2020-08-03 NOTE — Progress Notes (Signed)
Subjective: States right arm feeling better.  Seen in room with son and boyfriend.  Son states that she has been improving.  No longer trying to remove splint.     Objective: Vital signs in last 24 hours: Temp:  [98.5 F (36.9 C)-99 F (37.2 C)] 98.5 F (36.9 C) (04/29 0549) Pulse Rate:  [86-96] 96 (04/29 0549) Resp:  [13-16] 16 (04/29 0536) BP: (149-187)/(76-87) 164/87 (04/29 0549) SpO2:  [100 %] 100 % (04/29 0549)  Intake/Output from previous day: No intake/output data recorded. Intake/Output this shift: No intake/output data recorded.  Recent Labs    08/01/20 0359 08/02/20 1831 08/03/20 0525  HGB 11.2* 11.5* 11.1*   Recent Labs    08/02/20 1831 08/03/20 0525  WBC 5.8 5.5  RBC 4.19 4.11  HCT 36.5 35.8*  PLT 294 280   Recent Labs    08/02/20 1831 08/03/20 0525  NA  --  136  K  --  3.5  CL  --  99  CO2  --  28  BUN  --  12  CREATININE 0.62 0.62  GLUCOSE  --  104*  CALCIUM  --  10.1   No results for input(s): LABPT, INR in the last 72 hours.  Intact sensation and capillary refill all digits.  Small amount of movement.  Mild swelling.  Dressing c/d/i.  Assessment/Plan: Right distal radius fracture and radial head fracture.  May benefit from operative fixation of distal radius.  Will get repeat XR of wrist and elbow.  Consider surgical fixation as mental status improves to allow with maintenance of splint and therapy post op.    Danielle Kirby 08/03/2020, 1:17 PM

## 2020-08-03 NOTE — Evaluation (Signed)
Physical Therapy Assessment and Plan  Patient Details  Name: Danielle Kirby MRN: [id number] Date of Birth: 1945-09-28  PT Diagnosis: Abnormality of gait, Cognitive deficits and Muscle weakness Rehab Potential: Good ELOS: 4 weeks   Today's Date: 08/03/2020 PT Individual Time:  -       Hospital Problem: Principal Problem:   TBI (traumatic brain injury) [facility])   Past Medical History:  Past Medical History:  Diagnosis Date  . Allergy   . Arthritis   . Cataract   . Heart murmur    Past Surgical History:  Past Surgical History:  Procedure Laterality Date  . BREAST SURGERY    . COSMETIC SURGERY    . EYE SURGERY    . TUBAL LIGATION      Assessment & Plan Clinical Impression:Patient is a 75 y.o. year old female with recent admission to the hospital on 4/21 after multiple falls. First fall was several days PTA where she fell down 4-6 stairs. Then fell fell in the bathroom and was found by significant other unresponsive. CT of brain small SDH or hygroma with suspected hemorrhage with ~37mm Right midline shift; Rt periorbital and pre maxillary soft tissue contusion. Fx of all walls of the Rt maxillary sinus with layering hemosinus. Additional fractures of the Rt lateral orbital wall and orbital floor with herniation of small amount of fat throgh orbital floor. 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 She was also found to have Rt wrist/elbow fx (NWB Rt UE). PMH includes: cataracts, arthritis. Patient transferred to CIR on 08/02/2020 .   Patient currently requires total with mobility secondary to muscle weakness, motor apraxia and decreased motor planning and decreased initiation, decreased attention, decreased awareness, decreased problem solving, decreased safety awareness and decreased memory.  Prior to hospitalization, patient was independent  with mobility and lived with Significant other in a House home.  Home access is  Stairs to enter.  Patient will benefit  from skilled PT intervention to maximize safe functional mobility, minimize fall risk and decrease caregiver burden for planned discharge home with 24 hour assist.  Anticipate patient will benefit from follow up [facility] at discharge.  PT - End of Session Activity Tolerance: Tolerates 30+ min activity with multiple rests Endurance Deficit: Yes Endurance Deficit Description: needs breaks due to attention deficits PT Assessment Rehab Potential (ACUTE/IP ONLY): Good PT Barriers to Discharge: Home environment access/layout;Behavior PT Barriers to Discharge Comments: multilevel home, at times limited participation due to poor inititation and paranoia per chart PT Patient demonstrates impairments in the following area(s): Balance;Behavior;Endurance;Motor;Safety PT Transfers Functional Problem(s): Bed Mobility;Bed to Chair;Car PT Locomotion Functional Problem(s): Ambulation;Wheelchair Mobility;Stairs PT Plan PT Intensity: Minimum of 1-2 x/day ,45 to 90 minutes PT Frequency: 5 out of 7 days PT Duration Estimated Length of Stay: 4 weeks PT Treatment/Interventions: Ambulation/gait training;DME/adaptive equipment instruction;Neuromuscular re-education;Stair training;UE/LE Strength taining/ROM;Wheelchair propulsion/positioning;UE/LE Coordination activities;Therapeutic Activities;Discharge planning;Balance/vestibular training;Cognitive remediation/compensation;Disease management/prevention;Functional mobility training;Patient/family education;Therapeutic Exercise;Splinting/orthotics;Visual/perceptual remediation/compensation PT Transfers Anticipated Outcome(s): S/CGA PT Locomotion Anticipated Outcome(s): S/CGA PT Recommendation Recommendations for Other Services: Neuropsych consult Follow Up Recommendations: Home health PT;24 hour supervision/assistance Patient destination: Home Equipment Recommended: To be determined   PT Evaluation Precautions/Restrictions Precautions Precautions: Fall Precaution  Comments: RUE sling and sugar tong splint Required Braces or Orthoses: Sling Restrictions RUE Weight Bearing: Non weight bearing General   Vital SignsTherapy Vitals Temp: 98.5 F (36.9 C) Temp Source: Oral Pulse Rate: 96 BP: (!) 164/87 Patient Position (if appropriate): Lying Oxygen Therapy SpO2: 100 % O2 Device: Room Air Pain Pain Assessment Pain  Score: 0-No pain Home Living/Prior Functioning Home Living Available Help at Discharge: Family;Available 24 hours/day Type of Home: House Home Access: Stairs to enter Entrance Stairs-Rails: Right;Left;Can reach both Home Layout: Multi-level Alternate Level Stairs-Number of Steps: 2 steps Bathroom Shower/Tub: Chiropodist: Standard  Lives With: Significant other Prior Function Level of Independence: Independent with transfers;Independent with gait;Independent with basic ADLs Driving: Yes Vocation: Retired (worked at [employer]) Vision/Perception     Cognition Overall Cognitive Status: (P) Impaired/Different from baseline Orientation Level: (P) Disoriented to place;Disoriented to situation;Disoriented to time Safety/Judgment: (P) Impaired Sensation Sensation Light Touch: Appears Intact Coordination Gross Motor Movements are Fluid and Coordinated: No Coordination and Movement Description: limited initiation of movement Motor  Motor Motor: Motor apraxia Motor - Skilled Clinical Observations: limited mobility due to decreased initiation, poor planning, slow processing   Trunk/Postural Assessment  Cervical Assessment Cervical Assessment: Exceptions to [facility] (forwad head) Thoracic Assessment Thoracic Assessment:  (rounded shoulders) Lumbar Assessment Lumbar Assessment: Within Functional Limits Postural Control Postural Control: Deficits on evaluation Protective Responses: delayed  Balance Balance Balance Assessed: Yes Static Sitting Balance Static Sitting - Balance Support: Feet supported;No  upper extremity supported Static Sitting - Level of Assistance: 5: Stand by assistance Dynamic Sitting Balance Dynamic Sitting - Balance Support: Feet supported Dynamic Sitting - Level of Assistance: 4: Min assist Dynamic Sitting - Balance Activities: Forward lean/weight shifting Sitting balance - Comments: reaching to pull up sock Static Standing Balance Static Standing - Level of Assistance: 1: +2 Total assist Static Standing - Comment/# of Minutes: up briefly on feet with knees and hips flexed to remove BSC and place recliner Extremity Assessment      RLE Assessment RLE Assessment: Exceptions to [facility] Passive Range of Motion (PROM) Comments: WFL Active Range of Motion (AROM) Comments: limited due to limited initiation General Strength Comments: hip flexion 2/5, knee extension 3-/5, ankle DF 3+/5 LLE Assessment LLE Assessment: Exceptions to [facility] Passive Range of Motion (PROM) Comments: WFL Active Range of Motion (AROM) Comments: limited due to limited initiation General Strength Comments: hip flexion 2/5, knee extension 3-/5, ankle DF 3+/5  Care Tool Care Tool Bed Mobility Roll left and right activity   Roll left and right assist level: Maximal Assistance - Patient 25 - 49%    Sit to lying activity Sit to lying activity did not occur: Safety/medical concerns      Lying to sitting edge of bed activity   Lying to sitting edge of bed assist level: Maximal Assistance - Patient 25 - 49%     Care Tool Transfers Sit to stand transfer   Sit to stand assist level: 2 Helpers    Chair/bed transfer   Chair/bed transfer assist level: Total Assistance - Patient < 25%     Toilet transfer   Assist Level: Total Assistance - Patient < 25%    Scientist, product/process development transfer activity did not occur: Safety/medical concerns        Care Tool Locomotion Ambulation Ambulation activity did not occur: Safety/medical concerns        Walk 10 feet activity Walk 10 feet activity did not occur:  Safety/medical concerns       Walk 50 feet with 2 turns activity Walk 50 feet with 2 turns activity did not occur: Safety/medical concerns      Walk 150 feet activity Walk 150 feet activity did not occur: Safety/medical concerns      Walk 10 feet on uneven surfaces activity Walk 10 feet on uneven surfaces  activity did not occur: Safety/medical concerns      Stairs Stair activity did not occur: Safety/medical concerns        Walk up/down 1 step activity Walk up/down 1 step or curb (drop down) activity did not occur: Safety/medical concerns     Walk up/down 4 steps activity did not occuR: Safety/medical concerns  Walk up/down 4 steps activity      Walk up/down 12 steps activity Walk up/down 12 steps activity did not occur: Safety/medical concerns      Pick up small objects from floor Pick up small object from the floor (from standing position) activity did not occur: Safety/medical concerns      Wheelchair     Wheelchair activity did not occur: Safety/medical concerns      Wheel 50 feet with 2 turns activity Wheelchair 50 feet with 2 turns activity did not occur: Safety/medical concerns    Wheel 150 feet activity Wheelchair 150 feet activity did not occur: Safety/medical concerns      Refer to Care Plan for Long Term Goals  SHORT TERM GOAL WEEK 1 PT Short Term Goal 1 (Week 1): Patient to demonstrate sustained attention to mobility tasks with mod cues 30% of the time. PT Short Term Goal 2 (Week 1): Patient to perform supine to sit with mod A with cues and increased time. PT Short Term Goal 3 (Week 1): Patient to perform bed/chair transfers with mod A within 5 minutes of initial cue. PT Short Term Goal 4 (Week 1): Patient to initate standing activities with +2 A with LRAD.  Recommendations for other services: Neuropsych  Skilled Therapeutic Intervention Patient in supine and alert, but disoriented to time, place and situation.  Able to recall significant other and son  and love of reading, but reports she has small friends that report things to her [place] and [place]).  Patient not initiating despite multiple cues and increased time so max A to sit up on EOB assisting legs and lifting trunk.  At EOB pt able to sit with S and reaching with CGA to adjust socks on her feet.  Patient with increased time and multiple cues and moving chair to attempt bed to recliner (move chair to her L as not attending to it on her R) but still not initiating with mod A.  Finally felt urge to urinate so assisted to [facility] with max A with drop arm and squat pivot.  Patient toileted and NT called to assist and RN in the room.  Patient sit to stand (with knees and hips flexed depended for balance) RN removed BSC and placed recliner and pt lowered into chair.  Assisted to change gown and pt initiated with mod A to brush teeth with sustained attention to task of brushing teeth.  Patient assisted to scoot back in recliner and placed alarm belt and pillow under R arm due to edema in hand.  LEft with call bell in reach and RN aware.  Mobility Bed Mobility Bed Mobility: Supine to Sit Supine to Sit: Maximal Assistance - Patient - Patient 25-49% Transfers Transfers: Set designer Transfers;Sit to Stand Sit to Stand: 2 Helpers Squat Pivot Transfers: Maximal Assistance - Patient 25-49% Locomotion  Gait Ambulation: No Gait Gait: No Stairs / Additional Locomotion Stairs: No Wheelchair Mobility Wheelchair Mobility: No   Discharge Criteria: Patient will be discharged from PT if patient refuses treatment 3 consecutive times without medical reason, if treatment goals not met, if there is a change in medical status, if  patient makes no progress towards goals or if patient is discharged from hospital.  The above assessment, treatment plan, treatment alternatives and goals were discussed and mutually agreed upon: No family available/patient unable  Jamison Oka, PT 08/03/2020,  9:28 AM

## 2020-08-03 NOTE — Progress Notes (Signed)
PROGRESS NOTE   Subjective/Complaints: Showing poor initiation with therapy Appears very sleepy, says she is sleeping fine at night Denies pain- decrease tramadol to q8H prn Hgb down to 11.1  ROS: +constipation  Objective:   No results found. Recent Labs    08/02/20 1831 08/03/20 0525  WBC 5.8 5.5  HGB 11.5* 11.1*  HCT 36.5 35.8*  PLT 294 280   Recent Labs    08/02/20 1831 08/03/20 0525  NA  --  136  K  --  3.5  CL  --  99  CO2  --  28  GLUCOSE  --  104*  BUN  --  12  CREATININE 0.62 0.62  CALCIUM  --  10.1   No intake or output data in the 24 hours ending 08/03/20 1153      Physical Exam: Vital Signs Blood pressure (!) 164/87, pulse 96, temperature 98.5 F (36.9 C), temperature source Oral, SpO2 100 %. Gen: no distress, normal appearing HEENT: oral mucosa pink and moist, NCAT Cardio: Reg rate Chest: normal effort, normal rate of breathing Abd: soft, non-distended Ext: no edema Musculoskeletal:  Cervical back: Neck supple.  Comments: Right wrist with splint. Some swelling in digits of right hand. Hand NVI Skin: General: Skin is warm.  Neurological:  Mental Status: She is alert.  Comments: Patient is alert in no acute distress. Makes eye contact with examiner. Oriented to self and [facility]. Did not know why she was here. Limited insight and awareness. Occasionally confabulated. Speech generally clear. RUE limited by splint/ortho, able to grip with fingers. LUE 4/5. RLE 3/5 prox to 4/5 distal. LLE 2/5 prox to 4/5 ADF/PF. Senses pai in all 4's Psychiatric:  Comments: Flat but cooperative   Assessment/Plan: 1. Functional deficits which require 3+ hours per day of interdisciplinary therapy in a comprehensive inpatient rehab setting.  Physiatrist is providing close team supervision and 24 hour management of active medical problems listed below.  Physiatrist and rehab team continue  to assess barriers to discharge/monitor patient progress toward functional and medical goals  Care Tool:  Bathing              Bathing assist       Upper Body Dressing/Undressing Upper body dressing        Upper body assist Assist Level: Moderate Assistance - Patient 50 - 74%    Lower Body Dressing/Undressing Lower body dressing            Lower body assist Assist for lower body dressing: Maximal Assistance - Patient 25 - 49%     Toileting Toileting    Toileting assist Assist for toileting: Total Assistance - Patient < 25% (purewick catheter)     Transfers Chair/bed transfer  Transfers assist           Locomotion Ambulation   Ambulation assist              Walk 10 feet activity   Assist           Walk 50 feet activity   Assist           Walk 150 feet activity   Assist  Walk 10 feet on uneven surface  activity   Assist           Wheelchair     Assist               Wheelchair 50 feet with 2 turns activity    Assist            Wheelchair 150 feet activity     Assist          Blood pressure (!) 164/87, pulse 96, temperature 98.5 F (36.9 C), temperature source Oral, SpO2 100 %.    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 Completed 7-day course of Keppra for seizure prophylaxis. Follow-up Dr. Conchita Paris -patient may Shower if radial splint cover -ELOS/Goals: 17-20 days/ supervision goals with PT, SLP and sup/min OT  Initial CIR evaluations today 2. 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 No current plans for long-term anticoagulation due to risk of fall/SDH. Follow-up vascular study while on rehab -antiplatelet therapy: Continue Lovenox 30 mg every 12 hours 3. Pain Management:Naprosyn 250 mg twice daily, decrease Ultram to q8H  prn 4. Mood:Provide emotional support -antipsychotic agents: N/A 5. Neuropsych: This patientis notcapable of making decisions on herown behalf. 6. Skin/Wound Care:Routine skin checks 7. Fluids/Electrolytes/Nutrition:Routine in and outs with follow-up chemistries 8. Right maxillary sinus, right lateral orbital wall and floor fractures with fat herniation. Follow-up outpatient Dr Leta Baptist. No surgical intervention planned 9. 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 Follow-up as outpatient. 10. Hypertension. Increase Norvasc to 10 mg daily. Monitor as she mobilizes with PT/OT 11. Incidental finding of abnormal appearing endometrium identified on MRI lumbar spine. Follow-up outpatient OB/GYN 12. Right distal comminuted distal radius fracture and minimally displaced radial head fracture. No surgical intervention at this time follow-up outpatient Dr. Merlyn Lot.  -Nonweightbearing with splintRUE. -encourage pt to keep splint in place 13. Constipation: placed nursing order for prune juice with meals  LOS: 1 days A FACE TO FACE EVALUATION WAS PERFORMED  Drema Pry Pauleen Goleman 08/03/2020, 11:53 AM

## 2020-08-03 NOTE — Progress Notes (Signed)
Patient refused her medications, (colace and Keppra) despite explanations given. Pt is confused and paranoid.

## 2020-08-03 NOTE — Progress Notes (Signed)
[facility] Individual Statement of Services  Patient Name:  [name]  Date:  08/03/2020  Welcome to the [facility].  Our goal is to provide you with an individualized program based on your diagnosis and situation, designed to meet your specific needs.  With this comprehensive rehabilitation program, you will be expected to participate in at least 3 hours of rehabilitation therapies Monday-Friday, with modified therapy programming on the weekends.  Your rehabilitation program will include the following services:  Physical Therapy (PT), Occupational Therapy (OT), Speech Therapy (ST), 24 hour per day rehabilitation nursing, Neuropsychology, Care Coordinator, Rehabilitation Medicine, Nutrition Services and Pharmacy Services  Weekly team conferences will be held on Wednesday to discuss your progress.  Your Inpatient Rehabilitation Care Coordinator will talk with you frequently to get your input and to update you on team discussions.  Team conferences with you and your family in attendance may also be held.  Expected length of stay: 4 weeks  Overall anticipated outcome: min-CGA level  Depending on your progress and recovery, your program may change. Your Inpatient Rehabilitation Care Coordinator will coordinate services and will keep you informed of any changes. Your Inpatient Rehabilitation Care Coordinator's name and contact numbers are listed  below.  The following services may also be recommended but are not provided by the Inpatient [facility] Center:   Driving Evaluations  Home Health Rehabiltiation Services  Outpatient Rehabilitation Services    Arrangements will be made to provide these services after discharge if needed.  Arrangements include referral to agencies that provide these services.  Your insurance has been verified to be:  Medicare part A and [employer] Your primary doctor is:  Edwina Barth  Pertinent information will be  shared with your doctor and your insurance company.  Inpatient Rehabilitation Care Coordinator:  Dossie Der, [place] [phone] or Luna Glasgow  Information discussed with and copy given to patient by: Lucy Chris, 08/03/2020, 11:18 AM

## 2020-08-03 NOTE — Evaluation (Signed)
Speech Language Pathology Assessment and Plan  Patient Details  Name: Danielle Kirby MRN: [id number] Date of Birth: 04/18/1945  SLP Diagnosis: Cognitive Impairments  Rehab Potential: Good ELOS: 4 weeks    Today's Date: 08/03/2020 SLP Individual Time: 0725-0825 SLP Individual Time Calculation (min): 60 min   Hospital Problem: Principal Problem:   TBI (traumatic brain injury) [facility])  Past Medical History:  Past Medical History:  Diagnosis Date  . Allergy   . Arthritis   . Cataract   . Heart murmur    Past Surgical History:  Past Surgical History:  Procedure Laterality Date  . BREAST SURGERY    . COSMETIC SURGERY    . EYE SURGERY    . TUBAL LIGATION      Assessment / Plan / Recommendation Clinical Impression Patient is a 75 year old right-handed female with unremarkable past medical history except cataracts and osteoarthritis. Presented 07/26/2020 after reports of multiple falls. The first fall was several days prior to admission where she fell down 4-6 stairs; she then fell again in the bathroom and found by significant other unresponsive. Cranial CT scan showed a small right thalamocapsular hypodensity possibly representing age-indeterminate lacunar infarction. Small 3 mm largely low-density left cerebral convexity subdural collection suspicious for subdural hematoma or hygroma likely in part chronic. Right periorbital and premaxillary soft tissue contusion. Fractures of walls of the right maxillary sinus with layering hemosinus. Additional fractures of the right lateral orbital wall and orbital floor with herniation small amount of fat through the orbital floor defect and involvement of the infraorbital foramen. CT angiogram head and neck no emergent large vessel occlusion or hemodynamically significant proximal stenosis intracranially. Approximately 50% stenosis of the proximal left ICA secondary to predominantly calcified atherosclerosis. 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 MRI of the brain showed no acute infarction. Numerous small foci of susceptibility artifact within the posterior right temporal and right parietal occipital region without associated edema or hyperdensity compatible with prior microhemorrhages. MRI lumbar spine showed mild multilevel degenerative change as well as incidental abnormal appearance of the endometrium partially imaged but suspicious for possible cancer and patient was to follow-up outpatient OB/GYN. CT of the right wrist/elbow showed comminuted mildly impacted right intra-articular fracture of the distal radius, minimally displaced ulnar styloid fracture. Follow-up neurosurgery regards to small amount of subdural blood as well as incidental left supraclinoid ICA aneurysm follow-up outpatient Dr. Kathyrn Sheriff. Dr. Fredna Dow follow-up for comminuted distal radius fracture minimally displaced radial head fracture recommend sugar-tong splint follow-up in 1 week there was some discussion of possible ORIF of distal radius. Follow-up Dr. Iran Planas in regards to facial fractures nonoperative follow-up outpatient as well as recommendations of ophthalmology eye exam. NWB RUE in a sling.  Venous Doppler studies 07/30/2020 showed age-indeterminate DVT left peroneal vein. Given recurrent falls/SDH no current recommendations for anticoagulation advised and follow-up vascular study. Therapy evaluations completed due to patient's traumatic brain injury was recommended for a comprehensive rehab program. Patient admitted 08/02/20.  Patient demonstrates behaviors consistent with a Rancho Level V and requires Max-Total A to complete functional and familiar tasks safely. Patient demonstrates severe deficits in arousal, sustained attention, initiation, orientation, functional problem solving, recall and intellectual awareness. Patient was extremely verbose with perseveration on topics which were unable to  be redirected. Thoughts consisted mostly of language of confusion. Patient would benefit from skilled SLP intervention to maximize her cognitive functioning and overall functional independence prior to discharge.    Skilled Therapeutic Interventions  Administered a cognitive-linguistic evaluation, please see above for details. Throughout evaluation, patient required Max verbal cues to keep were eyes open for 2 minute intervals. Patient also required Max A verbal and tactile cues for initiation and attention to self-feeding.  Because of this, patient is now full supervision with meals. Patient left upright in bed with alarm on and all needs within reach. Continue with current plan of care.    SLP Assessment  Patient will need skilled Speech Lanaguage Pathology Services during CIR admission    Recommendations  Postural Changes and/or Swallow Maneuvers: Seated upright 90 degrees Oral Care Recommendations: Oral care BID Recommendations for Other Services: Neuropsych consult Patient destination: Home Follow up Recommendations: 24 hour supervision/assistance;Home Health SLP;Outpatient SLP Equipment Recommended: None recommended by SLP    SLP Frequency 3 to 5 out of 7 days   SLP Duration  SLP Intensity  SLP Treatment/Interventions 4 weeks  Minumum of 1-2 x/day, 30 to 90 minutes  Cognitive remediation/compensation;Internal/external aids;Therapeutic Activities;Environmental controls;Cueing hierarchy;Functional tasks;Patient/family education    Pain No/Denies Pain  SLP Evaluation Cognition Overall Cognitive Status: Impaired/Different from baseline Arousal/Alertness: Lethargic Orientation Level: Oriented to place;Disoriented to situation;Disoriented to time;Oriented to situation Attention: Focused;Sustained Focused Attention: Impaired Sustained Attention: Impaired Sustained Attention Impairment: Verbal basic;Functional basic Memory: Impaired Memory Impairment: Decreased short term  memory;Storage deficit Awareness: Impaired Awareness Impairment: Intellectual impairment Problem Solving: Impaired Problem Solving Impairment: Verbal basic;Functional basic Executive Function:  (all impaired due to lower level deficits) Behaviors: Perseveration Safety/Judgment: Impaired Rancho [employer] Scales of Cognitive Functioning: Confused/inappropriate/non-agitated  Comprehension Auditory Comprehension Overall Auditory Comprehension: Impaired Visual Recognition/Discrimination Discrimination: Not tested Reading Comprehension Reading Status: Not tested Expression Expression Primary Mode of Expression: Verbal Verbal Expression Overall Verbal Expression: Impaired Pragmatics: Impairment Impairments: Abnormal affect;Eye contact Written Expression Dominant Hand: Right Written Expression: Unable to assess (comment) Oral Motor Oral Motor/Sensory Function Overall Oral Motor/Sensory Function: Within functional limits Motor Speech Overall Motor Speech: Appears within functional limits for tasks assessed  Care Tool Care Tool Cognition Expression of Ideas and Wants Expression of Ideas and Wants: Some difficulty - exhibits some difficulty with expressing needs and ideas (e.g, some words or finishing thoughts) or speech is not clear   Understanding Verbal and Non-Verbal Content Understanding Verbal and Non-Verbal Content: Sometimes understands - understands only basic conversations or simple, direct phrases. Frequently requires cues to understand   Memory/Recall Ability *first 3 days only Memory/Recall Ability *first 3 days only: None of the above were recalled     Short Term Goals: Week 1: SLP Short Term Goal 1 (Week 1): Patient will initiate functional tasks in 50% of opportunities with Max A verbal cues. SLP Short Term Goal 2 (Week 1): Patient will demonstrate sustained attention to functional tasks for 2 minutes with Max verbal cues for redirection. SLP Short Term Goal 3 (Week  1): Patient will keep her eyes open for ~10 minute intervals with Max A multimodal cues. SLP Short Term Goal 4 (Week 1): Patient will demonstrate orientation to place, time and orientation with Max A multimodal cues. SLP Short Term Goal 5 (Week 1): Patient will maintain topic of conversation for ~3 turns with Max A verbal cues.  Refer to Care Plan for Long Term Goals  Recommendations for other services: Neuropsych  Discharge Criteria: Patient will be discharged from SLP if patient refuses treatment 3 consecutive times without medical reason, if treatment goals not met, if there is a change in medical status, if patient makes no progress towards goals or if patient is discharged  from hospital.  The above assessment, treatment plan, treatment alternatives and goals were discussed and mutually agreed upon: by patient  Taylen Osorto 08/03/2020, 12:44 PM

## 2020-08-04 ENCOUNTER — Inpatient Hospital Stay (HOSPITAL_COMMUNITY): Payer: Medicare Other

## 2020-08-04 DIAGNOSIS — M7989 Other specified soft tissue disorders: Secondary | ICD-10-CM

## 2020-08-04 DIAGNOSIS — R609 Edema, unspecified: Secondary | ICD-10-CM

## 2020-08-04 MED ORDER — BISACODYL 10 MG RE SUPP
10.0000 mg | Freq: Every day | RECTAL | Status: DC | PRN
  Administered 2020-08-04: 10 mg via RECTAL
  Filled 2020-08-04: qty 1

## 2020-08-04 NOTE — Progress Notes (Signed)
PROGRESS NOTE   Subjective/Complaints: Vascular ultrasound negative Refusing medications No recent BM- dulcolax suppository daily PRN ordered  ROS: +constipation  Objective:   DG ELBOW COMPLETE RIGHT (3+VIEW)  Result Date: 08/03/2020 CLINICAL DATA:  Fracture EXAM: RIGHT ELBOW - COMPLETE 3+ VIEW COMPARISON:  Elbow CT 07/26/2020 FINDINGS: Unchanged alignment of the radial head and neck fracture including the articular surface splint. Unchanged small capitellar fracture with small displaced fragment. Unchanged alignment of the coronoid process fracture. IMPRESSION: Unchanged alignment of the radial head/neck, capitellum, and coronoid process fractures Electronically Signed   By: Caprice Renshaw   On: 08/03/2020 15:46   DG Wrist 2 Views Right  Result Date: 08/03/2020 CLINICAL DATA:  Fracture EXAM: RIGHT WRIST - 2 VIEW COMPARISON:  CT 07/26/2020 FINDINGS: There is impacted dorsally angulated distal radial fracture with intra-articular extension and a mildly displaced ulnar styloid fracture unchanged in alignment interval casting. IMPRESSION: Impacted, comminuted intra-articular fracture of the distal radius with dorsal angulation and mild displaced ulnar styloid fracture unchanged in alignment with interval casting. Electronically Signed   By: Caprice Renshaw   On: 08/03/2020 15:41   VAS [place] LOWER EXTREMITY VENOUS (DVT)  Result Date: 08/04/2020  Lower Venous DVT Study Patient Name:  Danielle Kirby  Date of Exam:   08/04/2020 Medical Rec #: [id number]        Accession #:    [id number] Date of Birth: October 15, 1945         Patient Gender: F Patient Age:   60Y Exam Location:  [facility] Procedure:      VAS [place] LOWER EXTREMITY VENOUS (DVT) Referring Phys: [id number] DANIEL J ANGIULLI --------------------------------------------------------------------------------  Indications: Edema, and Swelling.  Comparison Study: 07/30/20 previous- lt pero dvt Performing  Technologist: Blanch Media RVS  Examination Guidelines: A complete evaluation includes B-mode imaging, spectral Doppler, color Doppler, and power Doppler as needed of all accessible portions of each vessel. Bilateral testing is considered an integral part of a complete examination. Limited examinations for reoccurring indications may be performed as noted. The reflux portion of the exam is performed with the patient in reverse Trendelenburg.  +---------+---------------+---------+-----------+----------+--------------+ RIGHT    CompressibilityPhasicitySpontaneityPropertiesThrombus Aging +---------+---------------+---------+-----------+----------+--------------+ CFV      Full           Yes      Yes                                 +---------+---------------+---------+-----------+----------+--------------+ SFJ      Full                                                        +---------+---------------+---------+-----------+----------+--------------+ FV Prox  Full                                                        +---------+---------------+---------+-----------+----------+--------------+  FV Mid   Full                                                        +---------+---------------+---------+-----------+----------+--------------+ FV DistalFull                                                        +---------+---------------+---------+-----------+----------+--------------+ PFV      Full                                                        +---------+---------------+---------+-----------+----------+--------------+ POP      Full           Yes      Yes                                 +---------+---------------+---------+-----------+----------+--------------+ PTV      Full                                                        +---------+---------------+---------+-----------+----------+--------------+ PERO     Full                                                         +---------+---------------+---------+-----------+----------+--------------+   +---------+---------------+---------+-----------+----------+-----------------+ LEFT     CompressibilityPhasicitySpontaneityPropertiesThrombus Aging    +---------+---------------+---------+-----------+----------+-----------------+ CFV      Full           Yes      Yes                                    +---------+---------------+---------+-----------+----------+-----------------+ SFJ      Full                                                           +---------+---------------+---------+-----------+----------+-----------------+ FV Prox  Full                                                           +---------+---------------+---------+-----------+----------+-----------------+ FV Mid   Full                                                           +---------+---------------+---------+-----------+----------+-----------------+  FV DistalFull                                                           +---------+---------------+---------+-----------+----------+-----------------+ PFV      Full                                                           +---------+---------------+---------+-----------+----------+-----------------+ POP      Full           Yes      Yes                                    +---------+---------------+---------+-----------+----------+-----------------+ PTV      Full                                                           +---------+---------------+---------+-----------+----------+-----------------+ PERO     Partial                                      Age Indeterminate +---------+---------------+---------+-----------+----------+-----------------+     Summary: RIGHT: - There is no evidence of deep vein thrombosis in the lower extremity.  - No cystic structure found in the popliteal fossa.  LEFT: - Findings consistent with age indeterminate  deep vein thrombosis involving the left peroneal veins. - A cystic structure is found in the popliteal fossa.  *See table(s) above for measurements and observations. Electronically signed by Coral Else MD on 08/04/2020 at 4:11:24 PM.    Final    Recent Labs    08/02/20 1831 08/03/20 0525  WBC 5.8 5.5  HGB 11.5* 11.1*  HCT 36.5 35.8*  PLT 294 280   Recent Labs    08/02/20 1831 08/03/20 0525  NA  --  136  K  --  3.5  CL  --  99  CO2  --  28  GLUCOSE  --  104*  BUN  --  12  CREATININE 0.62 0.62  CALCIUM  --  10.1    Intake/Output Summary (Last 24 hours) at 08/04/2020 1634 Last data filed at 08/04/2020 1248 Gross per 24 hour  Intake 130 ml  Output 200 ml  Net -70 ml        Physical Exam: Vital Signs Blood pressure 130/83, pulse 91, temperature 98.5 F (36.9 C), resp. rate 16, SpO2 100 %. Gen: no distress, normal appearing HEENT: oral mucosa pink and moist, NCAT Cardio: Reg rate Chest: normal effort, normal rate of breathing Abd: soft, non-distended Ext: no edema Psych: pleasant, normal affect Musculoskeletal:  Cervical back: Neck supple.  Comments: Right wrist with splint. Some swelling in digits of right hand. Hand NVI Skin: General: Skin is warm.  Neurological:  Mental Status: She is alert.  Comments: Patient is alert in no acute distress. Makes eye  contact with examiner. Oriented to self and [facility]. Did not know why she was here. Limited insight and awareness. Occasionally confabulated. Speech generally clear. RUE limited by splint/ortho, able to grip with fingers. LUE 4/5. RLE 3/5 prox to 4/5 distal. LLE 2/5 prox to 4/5 ADF/PF. Senses pai in all 4's Psychiatric:  Comments: Flat but cooperative   Assessment/Plan: 1. Functional deficits which require 3+ hours per day of interdisciplinary therapy in a comprehensive inpatient rehab setting.  Physiatrist is providing close team supervision and 24 hour management of active medical problems  listed below.  Physiatrist and rehab team continue to assess barriers to discharge/monitor patient progress toward functional and medical goals  Care Tool:  Bathing    Body parts bathed by patient: Chest   Body parts bathed by helper: Right arm,Left arm,Abdomen,Front perineal area,Buttocks,Left upper leg,Right upper leg,Left lower leg,Right lower leg,Face     Bathing assist Assist Level: 2 Helpers     Upper Body Dressing/Undressing Upper body dressing   What is the patient wearing?: Pull over shirt    Upper body assist Assist Level: Maximal Assistance - Patient 25 - 49%    Lower Body Dressing/Undressing Lower body dressing      What is the patient wearing?: Pants     Lower body assist Assist for lower body dressing: 2 Helpers     Toileting Toileting    Toileting assist Assist for toileting: Total Assistance - Patient < 25%     Transfers Chair/bed transfer  Transfers assist     Chair/bed transfer assist level: Total Assistance - Patient < 25%     Locomotion Ambulation   Ambulation assist   Ambulation activity did not occur: Safety/medical concerns          Walk 10 feet activity   Assist  Walk 10 feet activity did not occur: Safety/medical concerns        Walk 50 feet activity   Assist Walk 50 feet with 2 turns activity did not occur: Safety/medical concerns         Walk 150 feet activity   Assist Walk 150 feet activity did not occur: Safety/medical concerns         Walk 10 feet on uneven surface  activity   Assist Walk 10 feet on uneven surfaces activity did not occur: Safety/medical concerns         Wheelchair     Assist     Wheelchair activity did not occur: Safety/medical concerns         Wheelchair 50 feet with 2 turns activity    Assist    Wheelchair 50 feet with 2 turns activity did not occur: Safety/medical concerns       Wheelchair 150 feet activity     Assist  Wheelchair 150 feet  activity did not occur: Safety/medical concerns       Blood pressure 130/83, pulse 91, temperature 98.5 F (36.9 C), resp. rate 16, SpO2 100 %.    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 Completed 7-day course of Keppra for seizure prophylaxis. Follow-up Dr. Conchita Paris -patient may Shower if radial splint cover -ELOS/Goals: 17-20 days/ supervision goals with PT, SLP and sup/min OT  Continue CIR 2. 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 No current plans for long-term anticoagulation due to risk of fall/SDH. Follow-up vascular study while on rehab -antiplatelet therapy: Continue Lovenox 30 mg every 12 hours 3. Pain Management:Naprosyn 250 mg twice daily, decrease Ultram to q8H prn 4. Mood:Provide  emotional support -antipsychotic agents: N/A 5. Neuropsych: This patientis notcapable of making decisions on herown behalf. 6. Skin/Wound Care:Routine skin checks 7. Fluids/Electrolytes/Nutrition:Routine in and outs with follow-up chemistries 8. Right maxillary sinus, right lateral orbital wall and floor fractures with fat herniation. Follow-up outpatient Dr Leta Baptisthimmappa. No surgical intervention planned 9. 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 Follow-up as outpatient. 10. Hypertension. Continue Norvasc to 10 mg daily. Monitor as she mobilizes with PT/OT 11. Incidental finding of abnormal appearing endometrium identified on MRI lumbar spine. Follow-up outpatient OB/GYN 12. Right distal comminuted distal radius fracture and minimally displaced radial head fracture. No surgical intervention at this time follow-up outpatient Dr. Merlyn LotKuzma.  -Nonweightbearing with splintRUE.  XRs reviewed and stable.  -encourage pt to keep splint in place 13. Constipation: placed nursing order for prune juice with meals.  Dulcolax suppository added prn.   LOS: 2 days A FACE TO FACE EVALUATION WAS PERFORMED  Horton ChinKrutika P Diara Chaudhari 08/04/2020, 4:34 PM

## 2020-08-04 NOTE — Progress Notes (Signed)
Lower extremity venous has been completed.   Preliminary results in CV Proc.   Blanch Media 08/04/2020 1:05 PM

## 2020-08-04 NOTE — Progress Notes (Signed)
Physical Therapy TBI Note  Patient Details  Name: Danielle Kirby MRN: [id number] Date of Birth: 04-Jun-1945  Today's Date: 08/04/2020 PT Individual Time: 1537-1630 PT Individual Time Calculation (min): 53 min   Short Term Goals: Week 1:  PT Short Term Goal 1 (Week 1): Patient to demonstrate sustained attention to mobility tasks with mod cues 30% of the time. PT Short Term Goal 2 (Week 1): Patient to perform supine to sit with mod A with cues and increased time. PT Short Term Goal 3 (Week 1): Patient to perform bed/chair transfers with mod A within 5 minutes of initial cue. PT Short Term Goal 4 (Week 1): Patient to initate standing activities with +2 A with LRAD.  Skilled Therapeutic Interventions/Progress Updates:    Pt received asleep, supine in bed and upon awakening appears agreeable to therapy session. Pt continues to be disoriented - pt states her last name is "Danielle Kirby" and is not oriented to location nor situation - therapist reoriented pt multiple times throughout session but at the end of session pt was still unable to recall this information. Pt often would reply "I think she can", "I can try", "I think so" seeming to be confused and sometimes referring to herself in the 3rd person. Pt also appeared to be distracted and confused by noises in hallway thinking this therapist was speaking with someone when pt heard voices come from another pt room - closed pt's room door during session to decrease these distractions. Pt remained fatigued during session but once able to advance to sitting EOB she was able to maintain alertness with eyes open and engaged in session but would frequently initiate mobility back to lying down - pt was easily redirected and agreeable to continue therapy session. Therapist educated pt on R UE WBing restrictions repeatedly throughout session. Supine in bed donned pants with total assist for threading LEs then max/total for pulling over hips with cuing for pt to  complete/participate in task with noticeable impaired motor planning and poor initiation and sustained attention on task often starting to pull them up but then stopping once task became challenging or required prolonged attention - therapist assisted with completion. Supine>sitting R EOB, HOB flat and not using bedrail, via logroll technique to increase pt participation with max assist of 1 - pt continues to try and remain in R sidleying with poor problem solving and motor planning to figure out how to bring trunk upright requiring cuing for sequencing. Pt tolerated sitting EOB ~25minutes during session while participating in the following tasks: - sustained attention, dual task, and B LE AROM via long arc quads x10reps consecutively on each LE then alternating seated marches x20reps - had pt count the number of reps and stop at the given target - pt noticed to have decreased attention to L hemibody and requires max cuing to sustain task and perform correctly on L side compared to R - overall pt demons delayed processing to complete tasks - attempted to count number of "MDs" vs "PAs" on th medical team picture board in the patient's room but she was unable to perform this successfully without max cuing - attempted to recall names of familiar objects (hand sanitizer, lotion, etc.) with pt having difficulty and also noted to have worsening fatigue  Attempted sit>stand 2x from elevated EOM>L UE support around therapist's shoulders with pt demoing impaired motor planning and initiation of this task therefore unable to perform safely at this time. R lateral scoot along EOB towards HOB with mod assist -  cuing for head/hips relationship and pt demoing much improved motor planning and initiation of this task.  Sit>supine mod assist for B LE management into bed. In supine, completed the following B LE exercises and dual-task training via x10reps bridging and x10reps heel slides per LE with pt continuing to demo  decreased L attention and unable to count the reps outloud without cuing for that side (much less difficulty noted when performing exercises on RLE).   At end of session pt left supine in bed with needs in reach, bed alarm on, and telesitter in place.   Therapy Documentation Precautions:  Precautions Precautions: Fall Precaution Comments: RUE sling and sugar tong splint Required Braces or Orthoses: Sling Restrictions Weight Bearing Restrictions: Yes RUE Weight Bearing: Non weight bearing Pain:   Pt does not report nor appear to experience any pain/discomfort during session.  Agitated Behavior Scale: TBI Observation Details Observation Environment: Patient's room Start of observation period - Date: 08/04/20 Start of observation period - Time: 1537 End of observation period - Date: 08/04/20 End of observation period - Time: 1630 Agitated Behavior Scale (DO NOT LEAVE BLANKS) Short attention span, easy distractibility, inability to concentrate: Present to an extreme degree Impulsive, impatient, low tolerance for pain or frustration: Present to a moderate degree Uncooperative, resistant to care, demanding: Absent Violent and/or threatening violence toward people or property: Absent Explosive and/or unpredictable anger: Absent Rocking, rubbing, moaning, or other self-stimulating behavior: Absent Pulling at tubes, restraints, etc.: Absent Wandering from treatment areas: Absent Restlessness, pacing, excessive movement: Present to a slight degree Repetitive behaviors, motor, and/or verbal: Present to a slight degree Rapid, loud, or excessive talking: Absent Sudden changes of mood: Absent Easily initiated or excessive crying and/or laughter: Absent Self-abusiveness, physical and/or verbal: Absent Agitated behavior scale total score: 21    Therapy/Group: Individual Therapy  Ginny Forth , PT, DPT, CSRS  08/04/2020, 3:23 PM

## 2020-08-05 NOTE — Progress Notes (Signed)
Occupational Therapy Session Note  Patient Details  Name: Danielle Kirby MRN: [id number] Date of Birth: [birth year]  Today's Date: 08/05/2020 OT Individual Time: 0076-2263 OT Individual Time Calculation (min): 70 min   Session 2: OT Individual Time: 3354-5625 OT Individual Time Calculation (min): 15 min   Session 3: OT Individual Time: 1430-1500 OT Individual Time Calculation (min): 30 min    Short Term Goals: Week 1:  OT Short Term Goal 1 (Week 1): Patient will complete toilet transfer with max A +2 OT Short Term Goal 2 (Week 1): Patient will complete 1 step of UB dressing with max verbal cues OT Short Term Goal 3 (Week 1): Patient will complete sit<> stand with max A +2 in prep for BADL tasks.  Skilled Therapeutic Interventions/Progress Updates:    Pt supine but eyes closed but easily awakened. Pt not oriented to anything, incredibly confused, but very pleasant. Pt with very poor initiation of any task and internally distracted. She required manual facilitation to initiate bed mobility to EOB but was able to carryover motor plan with only mod A. She then maintained EOB sitting balance with supervision as she ate breakfast. She required max A to eat breakfast d/t inattention and initiation distracting her from bringing fork to mouth. Pt with very poor working memory, unable to carryover details from conversation for more than several seconds. Gentle orientation provided throughout session. Pt not wearing splint or any type of brace on her RUE. Pt took a lying rest break for a minute before she returned to EOB with improved initiation. She completed UB bathing with mod A for thoroughness/initiation. She donned a new shirt with mod A.  Sugar tong splint donned and reinforced with ace bandage. Pt required cueing for RUE NWB intermittently and although she had no recall or pain in the RUE she was not frequently attempting to use it. She stood from EOB x2 with mod-max A. She completed stand pivot  transfer to the recliner with max A. She completed oral care with set up assist and only min cueing for initiation, no cueing required for termination. Pt was left sitting up in the recliner with all needs met, chair alarm belt fastened.   Session 2:  Pt supine, easily awoken. Pt agreeable to use bathroom. She was more impulsive this session likely d/t urgency and incontinence occurring. Pt completed bed mobility with min A to EOB. Mod cueing for RUE NWB. She completed stand pivot transfer to the [facility] with mod A. Mod A for toileting tasks overall with pt incontinent of urine but then continent void in [facility] as well. New pants donned with mod A. Pt stood for peri care with mod A. She returned to supine in bed, curled up. Bed alarm set.   Session 3: Pt received supine, alert, not oriented and confused. Pt unable to recall any events of day that occurred previously. Pt declined getting OOB. Pt completed sorting task while supine with utensils, requiring min cueing initially but then able to carryover pattern and sustaining attention to task in controlled, quiet environment. Pt then consumed an ice cream with no cueing for initiation- good improvement from this morning. Pt was left supine with bed alarm set, all needs met.    Therapy Documentation Precautions:  Precautions Precautions: Fall Precaution Comments: RUE sling and sugar tong splint Required Braces or Orthoses: Sling Restrictions Weight Bearing Restrictions: Yes RUE Weight Bearing: Non weight bearing   Therapy/Group: Individual Therapy  Curtis Sites 08/05/2020, 6:13 AM

## 2020-08-05 NOTE — Progress Notes (Addendum)
PROGRESS NOTE   Subjective/Complaints: No complaints this morning Had Bm last night Patient's chart reviewed- No issues reported overnight Vitals signs stable except for elevated BP  ROS: denies constipation  Objective:   DG ELBOW COMPLETE RIGHT (3+VIEW)  Result Date: 08/03/2020 CLINICAL DATA:  Fracture EXAM: RIGHT ELBOW - COMPLETE 3+ VIEW COMPARISON:  Elbow CT 07/26/2020 FINDINGS: Unchanged alignment of the radial head and neck fracture including the articular surface splint. Unchanged small capitellar fracture with small displaced fragment. Unchanged alignment of the coronoid process fracture. IMPRESSION: Unchanged alignment of the radial head/neck, capitellum, and coronoid process fractures Electronically Signed   By: Caprice RenshawJacob  Kahn   On: 08/03/2020 15:46   DG Wrist 2 Views Right  Result Date: 08/03/2020 CLINICAL DATA:  Fracture EXAM: RIGHT WRIST - 2 VIEW COMPARISON:  CT 07/26/2020 FINDINGS: There is impacted dorsally angulated distal radial fracture with intra-articular extension and a mildly displaced ulnar styloid fracture unchanged in alignment interval casting. IMPRESSION: Impacted, comminuted intra-articular fracture of the distal radius with dorsal angulation and mild displaced ulnar styloid fracture unchanged in alignment with interval casting. Electronically Signed   By: Caprice RenshawJacob  Kahn   On: 08/03/2020 15:41   VAS US LOWER EXTREMITY VENOUS (DVT)  Result Date: 08/04/2020  Lower Venous DVT Study Patient Name:  Danielle Kirby  Date of Exam:   08/04/2020 Medical Rec #: [id number]        Accession #:    [id number] Date of Birth: 05/27/1945         Patient Gender: F Patient Age:   [age]Y [facility] Procedure:      VAS US LOWER EXTREMITY VENOUS (DVT) Referring Phys: [id number] DANIEL J ANGIULLI --------------------------------------------------------------------------------  Indications: Edema, and Swelling.  Comparison  Study: 07/30/20 previous- lt pero dvt Performing Technologist: Blanch MediaMegan Riddle RVS  Examination Guidelines: A complete evaluation includes B-mode imaging, spectral Doppler, color Doppler, and power Doppler as needed of all accessible portions of each vessel. Bilateral testing is considered an integral part of a complete examination. Limited examinations for reoccurring indications may be performed as noted. The reflux portion of the exam is performed with the patient in reverse Trendelenburg.  +---------+---------------+---------+-----------+----------+--------------+ RIGHT    CompressibilityPhasicitySpontaneityPropertiesThrombus Aging +---------+---------------+---------+-----------+----------+--------------+ CFV      Full           Yes      Yes                                 +---------+---------------+---------+-----------+----------+--------------+ SFJ      Full                                                        +---------+---------------+---------+-----------+----------+--------------+ FV Prox  Full                                                        +---------+---------------+---------+-----------+----------+--------------+  FV Mid   Full                                                        +---------+---------------+---------+-----------+----------+--------------+ FV DistalFull                                                        +---------+---------------+---------+-----------+----------+--------------+ PFV      Full                                                        +---------+---------------+---------+-----------+----------+--------------+ POP      Full           Yes      Yes                                 +---------+---------------+---------+-----------+----------+--------------+ PTV      Full                                                        +---------+---------------+---------+-----------+----------+--------------+ PERO      Full                                                        +---------+---------------+---------+-----------+----------+--------------+   +---------+---------------+---------+-----------+----------+-----------------+ LEFT     CompressibilityPhasicitySpontaneityPropertiesThrombus Aging    +---------+---------------+---------+-----------+----------+-----------------+ CFV      Full           Yes      Yes                                    +---------+---------------+---------+-----------+----------+-----------------+ SFJ      Full                                                           +---------+---------------+---------+-----------+----------+-----------------+ FV Prox  Full                                                           +---------+---------------+---------+-----------+----------+-----------------+ FV Mid   Full                                                           +---------+---------------+---------+-----------+----------+-----------------+  FV DistalFull                                                           +---------+---------------+---------+-----------+----------+-----------------+ PFV      Full                                                           +---------+---------------+---------+-----------+----------+-----------------+ POP      Full           Yes      Yes                                    +---------+---------------+---------+-----------+----------+-----------------+ PTV      Full                                                           +---------+---------------+---------+-----------+----------+-----------------+ PERO     Partial                                      Age Indeterminate +---------+---------------+---------+-----------+----------+-----------------+     Summary: RIGHT: - There is no evidence of deep vein thrombosis in the lower extremity.  - No cystic structure found in the popliteal fossa.   LEFT: - Findings consistent with age indeterminate deep vein thrombosis involving the left peroneal veins. - A cystic structure is found in the popliteal fossa.  *See table(s) above for measurements and observations. Electronically signed by Coral Else MD on 08/04/2020 at 4:11:24 PM.    Final    Recent Labs    08/02/20 1831 08/03/20 0525  WBC 5.8 5.5  HGB 11.5* 11.1*  HCT 36.5 35.8*  PLT 294 280   Recent Labs    08/02/20 1831 08/03/20 0525  NA  --  136  K  --  3.5  CL  --  99  CO2  --  28  GLUCOSE  --  104*  BUN  --  12  CREATININE 0.62 0.62  CALCIUM  --  10.1    Intake/Output Summary (Last 24 hours) at 08/05/2020 1234 Last data filed at 08/04/2020 1848 Gross per 24 hour  Intake 220 ml  Output --  Net 220 ml        Physical Exam: Vital Signs Blood pressure (!) 155/92, pulse 85, temperature 98.5 F (36.9 C), resp. rate 19, SpO2 98 %. Gen: no distress, normal appearing HEENT: oral mucosa pink and moist, NCAT Cardio: Reg rate Chest: normal effort, normal rate of breathing Abd: soft, non-distended Ext: no edema Psych: pleasant, normal affect Musculoskeletal:  Cervical back: Neck supple.  Comments: Right wrist with splint. Some swelling in digits of right hand. Hand NVI Skin: General: Skin is warm.  Neurological:  Mental Status: She is alert.  Comments: Patient is alert in no acute distress. Makes eye  contact with examiner. Oriented to self and [facility]. Did not know why she was here. Limited insight and awareness. Occasionally confabulated. Speech generally clear. RUE limited by splint/ortho, able to grip with fingers. LUE 4/5. RLE 3/5 prox to 4/5 distal. LLE 2/5 prox to 4/5 ADF/PF. Senses pai in all 4's Psychiatric:  Comments: Flat but cooperative   Assessment/Plan: 1. Functional deficits which require 3+ hours per day of interdisciplinary therapy in a comprehensive inpatient rehab setting.  Physiatrist is providing close team supervision and  24 hour management of active medical problems listed below.  Physiatrist and rehab team continue to assess barriers to discharge/monitor patient progress toward functional and medical goals  Care Tool:  Bathing    Body parts bathed by patient: Chest   Body parts bathed by helper: Right arm,Left arm,Abdomen,Front perineal area,Buttocks,Left upper leg,Right upper leg,Left lower leg,Right lower leg,Face     Bathing assist Assist Level: 2 Helpers     Upper Body Dressing/Undressing Upper body dressing   What is the patient wearing?: Pull over shirt    Upper body assist Assist Level: Maximal Assistance - Patient 25 - 49%    Lower Body Dressing/Undressing Lower body dressing      What is the patient wearing?: Pants,Incontinence brief     Lower body assist Assist for lower body dressing: Total Assistance - Patient < 25%     Toileting Toileting    Toileting assist Assist for toileting: Total Assistance - Patient < 25%     Transfers Chair/bed transfer  Transfers assist     Chair/bed transfer assist level: 2 Helpers     Locomotion Ambulation   Ambulation assist   Ambulation activity did not occur: Safety/medical concerns          Walk 10 feet activity   Assist  Walk 10 feet activity did not occur: Safety/medical concerns        Walk 50 feet activity   Assist Walk 50 feet with 2 turns activity did not occur: Safety/medical concerns         Walk 150 feet activity   Assist Walk 150 feet activity did not occur: Safety/medical concerns         Walk 10 feet on uneven surface  activity   Assist Walk 10 feet on uneven surfaces activity did not occur: Safety/medical concerns         Wheelchair     Assist     Wheelchair activity did not occur: Safety/medical concerns         Wheelchair 50 feet with 2 turns activity    Assist    Wheelchair 50 feet with 2 turns activity did not occur: Safety/medical concerns        Wheelchair 150 feet activity     Assist  Wheelchair 150 feet activity did not occur: Safety/medical concerns       Blood pressure (!) 155/92, pulse 85, temperature 98.5 F (36.9 C), resp. rate 19, SpO2 98 %.    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 Completed 7-day course of Keppra for seizure prophylaxis. Follow-up Dr. Conchita Paris -patient may Shower if radial splint cover -ELOS/Goals: 17-20 days/ supervision goals with PT, SLP and sup/min OT  Continue CIR 2. 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 f/u scan on 4/30 reviewed and also consistent with this finding. No current plans for long-term anticoagulation due to risk of fall/SDH. Follow-up vascular study while on rehab -antiplatelet therapy: Continue Lovenox 30 mg every 12 hours 3.  Pain Management:Continue Naprosyn 250 mg twice daily, decrease Ultram to q8H prn 4. Mood:Provide emotional support -antipsychotic agents: N/A 5. Neuropsych: This patientis notcapable of making decisions on herown behalf. 6. Skin/Wound Care:Routine skin checks 7. Fluids/Electrolytes/Nutrition:Routine in and outs with follow-up chemistries 8. Right maxillary sinus, right lateral orbital wall and floor fractures with fat herniation. Follow-up outpatient Dr Leta Baptist. No surgical intervention planned 9. 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 Follow-up as outpatient. 10. Hypertension. labile, continue Norvasc to 10 mg daily. Monitor as she mobilizes with PT/OT 11. Incidental finding of abnormal appearing endometrium identified on MRI lumbar spine. Follow-up outpatient OB/GYN 12. Right distal comminuted distal radius fracture and minimally displaced radial head fracture. No surgical intervention at this time follow-up outpatient Dr. Merlyn Lot.  -Nonweightbearing with  splintRUE.  XRs reviewed and stable.  -encourage pt to keep splint in place 13. Constipation: placed nursing order for prune juice with meals. Dulcolax suppository added prn. Had BM 4/30  LOS: 3 days A FACE TO FACE EVALUATION WAS PERFORMED  Drema Pry Aleni Andrus 08/05/2020, 12:34 PM

## 2020-08-05 NOTE — Progress Notes (Signed)
Physical Therapy TBI Note  Patient Details  Name: Danielle Kirby MRN: [id number] Date of Birth: September 18, 1945  Today's Date: 08/05/2020 PT Individual Time: 1310-1350 PT Individual Time Calculation (min): 40 min   Short Term Goals: Week 1:  PT Short Term Goal 1 (Week 1): Patient to demonstrate sustained attention to mobility tasks with mod cues 30% of the time. PT Short Term Goal 2 (Week 1): Patient to perform supine to sit with mod A with cues and increased time. PT Short Term Goal 3 (Week 1): Patient to perform bed/chair transfers with mod A within 5 minutes of initial cue. PT Short Term Goal 4 (Week 1): Patient to initate standing activities with +2 A with LRAD.  Skilled Therapeutic Interventions/Progress Updates:     Patient in bed sleeping with he son in the room upon PT arrival. Patient easily aroused to verbal stimuli and agreeable to PT session. Patient reported mild L lower extremity pain during session, RN made aware. PT provided repositioning, rest breaks, and distraction as pain interventions throughout session.   Patient's son left at beginning of session. He reported that the patient has personal clothing in the closet.   Patient oriented to place, situation, and time at beginning of session, as she asked if this was her new home.   Therapeutic Activity: Bed Mobility: Patient performed supine to sit with mod A in a flat bed without use of bed rail. Provided verbal cues for rollin got L side-lying, brining knees to chest to bring her legs off the bed, use of her bottom elbow to push up to sitting, and initiation for all movement due to slow processing. Patient sat EOB with CGA progressing to supervision. Assisted in picking out clothing from her suitcase. Patient perseverative, on "I bought that," and very slow to initiate a functional task. Required max cues to redirect patient to the task. Patient doffed paper scrub top with mod A, only initiating doffing when therapist initiated  task, rather than to cues. She donned a t-shirt with set-up assist after. She doffed paper scrub pants and donned her pants with mod-max. Patient initially folding and unfolding personal pants rather than initiating dressing. Required max cues for redirection.  Transfers: Patient performed sit to/from stand x4 with mod A and increased time for initiation. Provided verbal cues for initiation with 1-2-3 count, forward weight shift, and boosting up to standing. Patient maintained NWB with R upper extremity without cues during transfers.   Gait Training:  Patient ambulated 4 feet forwards and backwards and 4 feet side-stepping by the bed using L HHA with min-mod A. Ambulated with very small step length and height, decreased initiation of stepping on R, forward trunk lean, and reaching out to furniture with R hand requiring cues to maintain NWB. Provided verbal cues for initiation, sequencing, and encouragement to continue without success resulting in patient backing up to the bed.  Wheelchair Mobility:  Obtained 20"x18" light weight w/c with standard cushion for improved sitting tolerance OOB and ability to transport patient during sessions.   Upon PT return with w/c, patient asleep in the bed and declined any further mobility upon waking. Patient missed 20 min of skilled PT due to fatigue, RN made aware. Will attempt to make-up missed time as able.    Patient in bed asleep at end of session with breaks locked, bed alarm set, Telesitter in place, and all needs within reach.    Therapy Documentation Precautions:  Precautions Precautions: Fall Precaution Comments: RUE sling and sugar tong  splint Required Braces or Orthoses: Sling Restrictions Weight Bearing Restrictions: Yes RUE Weight Bearing: Non weight bearing Agitated Behavior Scale: TBI Observation Details Observation Environment: Patient's room Start of observation period - Date: 08/05/20 Start of observation period - Time: 1310 End of  observation period - Date: 08/05/20 End of observation period - Time: 1350 Agitated Behavior Scale (DO NOT LEAVE BLANKS) Short attention span, easy distractibility, inability to concentrate: Present to an extreme degree Impulsive, impatient, low tolerance for pain or frustration: Absent Uncooperative, resistant to care, demanding: Absent Violent and/or threatening violence toward people or property: Absent Explosive and/or unpredictable anger: Absent Rocking, rubbing, moaning, or other self-stimulating behavior: Absent Pulling at tubes, restraints, etc.: Absent Wandering from treatment areas: Absent Restlessness, pacing, excessive movement: Absent Repetitive behaviors, motor, and/or verbal: Present to a slight degree Rapid, loud, or excessive talking: Absent Sudden changes of mood: Absent Easily initiated or excessive crying and/or laughter: Absent Self-abusiveness, physical and/or verbal: Absent Agitated behavior scale total score: 18    Therapy/Group: Individual Therapy  Sylvester Minton L Kateena Degroote PT, DPT  08/05/2020, 5:05 PM

## 2020-08-05 NOTE — IPOC Note (Signed)
Overall Plan of Care (IPOC) Patient Details Name: Danielle Kirby MRN: [id number] DOB: May 13, 1945  Admitting Diagnosis: TBI (traumatic brain injury) [facility])  Hospital Problems: Principal Problem:   TBI (traumatic brain injury) (HCC)     Functional Problem List: Nursing Bladder,Bowel,Edema,Endurance,Medication Management,Safety,Pain  PT Balance,Behavior,Endurance,Motor,Safety  OT Balance,Behavior,Cognition,Endurance,Motor,Nutrition,Pain,Perception,Sensory,Safety,Vision  SLP Cognition  TR         Basic ADL's: OT Eating,Grooming,Bathing,Dressing,Toileting     Advanced  ADL's: OT       Transfers: PT Bed Mobility,Bed to Chair,Car  OT Toilet,Tub/Shower     Locomotion: PT Ambulation,Wheelchair Mobility,Stairs     Additional Impairments: OT Fuctional Use of Upper Extremity  SLP Social Cognition   Social Interaction,Problem Solving,Memory,Attention,Awareness  TR      Anticipated Outcomes Item Anticipated Outcome  Self Feeding Min A  Swallowing      Basic self-care  Min A  Toileting  Min A   Bathroom Transfers Min A  Bowel/Bladder  manage bowel and bladder with min assist  Transfers  S/CGA  Locomotion  S/CGA  Communication     Cognition  Min A  Pain  pain at or below  level 4  Safety/Judgment  Maintain safety with cues/reminders   Therapy Plan: PT Intensity: Minimum of 1-2 x/day ,45 to 90 minutes PT Frequency: 5 out of 7 days PT Duration Estimated Length of Stay: 4 weeks OT Intensity: Minimum of 1-2 x/day, 45 to 90 minutes OT Frequency: 5 out of 7 days OT Duration/Estimated Length of Stay: 4 weeks SLP Intensity: Minumum of 1-2 x/day, 30 to 90 minutes SLP Frequency: 3 to 5 out of 7 days SLP Duration/Estimated Length of Stay: 4 weeks   Due to the current state of emergency, patients may not be receiving their 3-hours of Medicare-mandated therapy.   Team Interventions: Nursing Interventions Patient/Family Education,Pain Management,Medication  Management,Bladder Management,Bowel Management,Discharge Planning,Disease Management/Prevention  PT interventions Ambulation/gait training,DME/adaptive equipment instruction,Neuromuscular re-education,Stair training,UE/LE Strength taining/ROM,Wheelchair propulsion/positioning,UE/LE Coordination activities,Therapeutic Activities,Discharge planning,Balance/vestibular training,Cognitive remediation/compensation,Disease management/prevention,Functional mobility training,Patient/family education,Therapeutic Exercise,Splinting/orthotics,Visual/perceptual remediation/compensation  OT Interventions Balance/vestibular training,Cognitive remediation/compensation,Community reintegration,Discharge planning,Disease mangement/prevention,Functional electrical stimulation,DME/adaptive equipment instruction,Functional mobility training,Neuromuscular re-education,Pain management,Patient/family education,Psychosocial support,Skin care/wound managment,Self Care/advanced ADL retraining,Therapeutic Activities,Splinting/orthotics,UE/LE Strength taining/ROM,Therapeutic Exercise,UE/LE Coordination activities,Visual/perceptual remediation/compensation,Wheelchair propulsion/positioning  SLP Interventions Cognitive remediation/compensation,Internal/external aids,Therapeutic Activities,Environmental controls,Cueing hierarchy,Functional tasks,Patient/family education  TR Interventions    SW/CM Interventions Discharge Planning,Psychosocial Support,Patient/Family Education   Barriers to Discharge MD  Medical stability  Nursing Decreased caregiver support,Home environment access/layout,Incontinence 2 level 6 step entry home with significant other; bedroom upstairs  PT Home environment access/layout,Behavior multilevel home, at times limited participation due to poor inititation and paranoia per chart  OT      SLP      SW Insurance for SNF coverage     Team Discharge Planning: Destination: PT-Home ,OT- Home , SLP-Home Projected  Follow-up: PT-Home health PT,24 hour supervision/assistance, OT-  Home health OT, SLP-24 hour supervision/assistance,Home Health SLP,Outpatient SLP Projected Equipment Needs: PT-To be determined, OT- To be determined, SLP-None recommended by SLP Equipment Details: PT- , OT-may need a tub transfer bench Patient/family involved in discharge planning: PT- Patient unable/family or caregiver not available,  OT-Patient, SLP-Patient  MD ELOS: 17-20 days Medical Rehab Prognosis:  Good Assessment: Danielle Kirby is a 75 year old woman admitted to CIR with 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 She has completed 7-day course of Keppra for seizure prophylaxis. Active medical issues include constipation, right distal comminuted radius fracture with minimally displaced radial head requiring splint and nonweightbearing status, and poor initiation. Labs and vitals are being monitored regularly and  medications are being adjusted as needed.    See Team Conference Notes for weekly updates to the plan of care

## 2020-08-06 DIAGNOSIS — S0990XA Unspecified injury of head, initial encounter: Secondary | ICD-10-CM

## 2020-08-06 DIAGNOSIS — S52124D Nondisplaced fracture of head of right radius, subsequent encounter for closed fracture with routine healing: Secondary | ICD-10-CM

## 2020-08-06 DIAGNOSIS — R4182 Altered mental status, unspecified: Secondary | ICD-10-CM

## 2020-08-06 DIAGNOSIS — I10 Essential (primary) hypertension: Secondary | ICD-10-CM

## 2020-08-06 DIAGNOSIS — D62 Acute posthemorrhagic anemia: Secondary | ICD-10-CM

## 2020-08-06 DIAGNOSIS — S52123A Displaced fracture of head of unspecified radius, initial encounter for closed fracture: Secondary | ICD-10-CM

## 2020-08-06 DIAGNOSIS — S069X1D Unspecified intracranial injury with loss of consciousness of 30 minutes or less, subsequent encounter: Secondary | ICD-10-CM

## 2020-08-06 MED ORDER — QUETIAPINE FUMARATE 25 MG PO TABS: 25.0000 mg | ORAL_TABLET | Freq: Every day | ORAL | Status: DC | PRN

## 2020-08-06 NOTE — Progress Notes (Signed)
Received a call from Tobi Bastos RN reporting Danielle Kirby was very anxious, confused and agitated. Dr Allena Katz note was reviewed. This provider was in contact with Dr. Carlis Abbott. Seroquel 25 mg at HS ordered. Spoke with charge nurse, she verbalizes understanding.

## 2020-08-06 NOTE — Progress Notes (Signed)
Speech Language Pathology TBI Note  Patient Details  Name: Danielle Kirby MRN: [id number] Date of Birth: 11/17/1945  Today's Date: 08/06/2020 SLP Individual Time: 1445-1530 SLP Individual Time Calculation (min): 45 min  Short Term Goals: Week 1: SLP Short Term Goal 1 (Week 1): Patient will initiate functional tasks in 50% of opportunities with Max A verbal cues. SLP Short Term Goal 2 (Week 1): Patient will demonstrate sustained attention to functional tasks for 2 minutes with Max verbal cues for redirection. SLP Short Term Goal 3 (Week 1): Patient will keep her eyes open for ~10 minute intervals with Max A multimodal cues. SLP Short Term Goal 4 (Week 1): Patient will demonstrate orientation to place, time and orientation with Max A multimodal cues. SLP Short Term Goal 5 (Week 1): Patient will maintain topic of conversation for ~3 turns with Max A verbal cues.  Skilled Therapeutic Interventions:Skilled ST services focused on cognitive skills. Pt's husband was present. SLP facilitated orientated with aids in room (white board and posted calendar), pt demonstrated intermittent orientation x4 requiring max-mod A verbal cues for use of direct aids. SLP also facilitated sustained attention and basic problem solving in card sorting task up to 6 colors, pt required initially mod A verbal cues fade to mod I and was able to sort cards by shapes in a field of 3 with supervision A verbal cues for problem solving. Pt demonstrated sustained attention up to 4 minutes, initially requiring max A verbal cues, but with repetitive task faded to min A verbal cues., Pt was left in room with husband, call bell within reach and chair alarm set. SLP recommends to continue skilled services.     Pain Pain Assessment Pain Score: 0-No pain  Agitated Behavior Scale: TBI Observation Details Observation Environment: CIR Start of observation period - Date: 08/06/20 Start of observation period - Time: 0245 End of  observation period - Date: 08/06/20 End of observation period - Time: 0330 Agitated Behavior Scale (DO NOT LEAVE BLANKS) Short attention span, easy distractibility, inability to concentrate: Present to a moderate degree Impulsive, impatient, low tolerance for pain or frustration: Present to a slight degree Uncooperative, resistant to care, demanding: Absent Violent and/or threatening violence toward people or property: Absent Explosive and/or unpredictable anger: Absent Rocking, rubbing, moaning, or other self-stimulating behavior: Absent Pulling at tubes, restraints, etc.: Absent Wandering from treatment areas: Absent Restlessness, pacing, excessive movement: Absent Repetitive behaviors, motor, and/or verbal: Present to a slight degree Rapid, loud, or excessive talking: Absent Sudden changes of mood: Absent Easily initiated or excessive crying and/or laughter: Absent Self-abusiveness, physical and/or verbal: Absent Agitated behavior scale total score: 18  Therapy/Group: Individual Therapy  Sacred Roa  [facility] 08/06/2020, 3:49 PM

## 2020-08-06 NOTE — Progress Notes (Signed)
[facility] PHYSICAL MEDICINE & REHABILITATION PROGRESS NOTE  Subjective/Complaints: Patient seen sitting up in bed this morning, eating breakfast.  Nurse tech at bedside.  No reported issues overnight.  ROS: Unreliable due to cognition  Objective: Vital Signs: Blood pressure 122/71, pulse 86, temperature 100 F (37.8 C), temperature source Oral, resp. rate 18, SpO2 100 %. VAS [place] LOWER EXTREMITY VENOUS (DVT)  Result Date: 08/04/2020  Lower Venous DVT Study Patient Name:  Danielle Kirby  Date of Exam:   08/04/2020 Medical Rec #: [id number]        Accession #:    [id number] Date of Birth: [birth year]         Patient Gender: F Patient Age:   62Y Exam Location:  [facility] Procedure:      VAS [place] LOWER EXTREMITY VENOUS (DVT) Referring Phys: [id number] DANIEL J ANGIULLI --------------------------------------------------------------------------------  Indications: Edema, and Swelling.  Comparison Study: 07/30/20 previous- lt pero dvt Performing Technologist: Blanch Media RVS  Examination Guidelines: A complete evaluation includes B-mode imaging, spectral Doppler, color Doppler, and power Doppler as needed of all accessible portions of each vessel. Bilateral testing is considered an integral part of a complete examination. Limited examinations for reoccurring indications may be performed as noted. The reflux portion of the exam is performed with the patient in reverse Trendelenburg.  +---------+---------------+---------+-----------+----------+--------------+ RIGHT    CompressibilityPhasicitySpontaneityPropertiesThrombus Aging +---------+---------------+---------+-----------+----------+--------------+ CFV      Full           Yes      Yes                                 +---------+---------------+---------+-----------+----------+--------------+ SFJ      Full                                                        +---------+---------------+---------+-----------+----------+--------------+ FV  Prox  Full                                                        +---------+---------------+---------+-----------+----------+--------------+ FV Mid   Full                                                        +---------+---------------+---------+-----------+----------+--------------+ FV DistalFull                                                        +---------+---------------+---------+-----------+----------+--------------+ PFV      Full                                                        +---------+---------------+---------+-----------+----------+--------------+ POP  Full           Yes      Yes                                 +---------+---------------+---------+-----------+----------+--------------+ PTV      Full                                                        +---------+---------------+---------+-----------+----------+--------------+ PERO     Full                                                        +---------+---------------+---------+-----------+----------+--------------+   +---------+---------------+---------+-----------+----------+-----------------+ LEFT     CompressibilityPhasicitySpontaneityPropertiesThrombus Aging    +---------+---------------+---------+-----------+----------+-----------------+ CFV      Full           Yes      Yes                                    +---------+---------------+---------+-----------+----------+-----------------+ SFJ      Full                                                           +---------+---------------+---------+-----------+----------+-----------------+ FV Prox  Full                                                           +---------+---------------+---------+-----------+----------+-----------------+ FV Mid   Full                                                           +---------+---------------+---------+-----------+----------+-----------------+ FV DistalFull                                                            +---------+---------------+---------+-----------+----------+-----------------+ PFV      Full                                                           +---------+---------------+---------+-----------+----------+-----------------+ POP      Full           Yes      Yes                                    +---------+---------------+---------+-----------+----------+-----------------+  PTV      Full                                                           +---------+---------------+---------+-----------+----------+-----------------+ PERO     Partial                                      Age Indeterminate +---------+---------------+---------+-----------+----------+-----------------+     Summary: RIGHT: - There is no evidence of deep vein thrombosis in the lower extremity.  - No cystic structure found in the popliteal fossa.  LEFT: - Findings consistent with age indeterminate deep vein thrombosis involving the left peroneal veins. - A cystic structure is found in the popliteal fossa.  *See table(s) above for measurements and observations. Electronically signed by Coral Else MD on 08/04/2020 at 4:11:24 PM.    Final    No results for input(s): WBC, HGB, HCT, PLT in the last 72 hours. No results for input(s): NA, K, CL, CO2, GLUCOSE, BUN, CREATININE, CALCIUM in the last 72 hours.  Intake/Output Summary (Last 24 hours) at 08/06/2020 1301 Last data filed at 08/06/2020 1235 Gross per 24 hour  Intake 515 ml  Output --  Net 515 ml        Physical Exam: BP 122/71 (BP Location: Left Arm)   Pulse 86   Temp 100 F (37.8 C) (Oral)   Resp 18   SpO2 100%  Constitutional: No distress . Vital signs reviewed. HENT: Normocephalic.  Atraumatic. Eyes: EOMI. No discharge. Cardiovascular: No JVD.  RRR. Respiratory: Normal effort.  No stridor.  Bilateral clear to auscultation. GI: Non-distended.  BS +. Skin: Warm and dry.  Right upper  extremity splint CDI Psych: Anxious.  Skeptical. Musc: Right wrist splinted with some edema and tenderness Mental Status: Alert to first name only Makes eye contact with examiner.  Limited insight and awareness.  Motor: LUE/LLE: 4/5 proximal distal RUE: Shoulder abduction 4/5, distally limited by splint RLE: 4/5 proximal to distal  Assessment/Plan: 1. Functional deficits which require 3+ hours per day of interdisciplinary therapy in a comprehensive inpatient rehab setting.  Physiatrist is providing close team supervision and 24 hour management of active medical problems listed below.  Physiatrist and rehab team continue to assess barriers to discharge/monitor patient progress toward functional and medical goals   Care Tool:  Bathing    Body parts bathed by patient: Right arm,Left arm,Front perineal area,Face   Body parts bathed by helper: Buttocks     Bathing assist Assist Level: Minimal Assistance - Patient > 75%     Upper Body Dressing/Undressing Upper body dressing   What is the patient wearing?: Pull over shirt,Button up shirt    Upper body assist Assist Level: Minimal Assistance - Patient > 75%    Lower Body Dressing/Undressing Lower body dressing      What is the patient wearing?: Pants,Incontinence brief     Lower body assist Assist for lower body dressing: Maximal Assistance - Patient 25 - 49%     Toileting Toileting    Toileting assist Assist for toileting: Maximal Assistance - Patient 25 - 49%     Transfers Chair/bed transfer  Transfers assist     Chair/bed transfer assist level: 2 Helpers  Locomotion Ambulation   Ambulation assist   Ambulation activity did not occur: Safety/medical concerns  Assist level: Moderate Assistance - Patient 50 - 74% Assistive device: Hand held assist Max distance: 4 ft   Walk 10 feet activity   Assist  Walk 10 feet activity did not occur: Safety/medical concerns        Walk 50 feet  activity   Assist Walk 50 feet with 2 turns activity did not occur: Safety/medical concerns         Walk 150 feet activity   Assist Walk 150 feet activity did not occur: Safety/medical concerns         Walk 10 feet on uneven surface  activity   Assist Walk 10 feet on uneven surfaces activity did not occur: Safety/medical concerns         Wheelchair     Assist     Wheelchair activity did not occur: Safety/medical concerns         Wheelchair 50 feet with 2 turns activity    Assist    Wheelchair 50 feet with 2 turns activity did not occur: Safety/medical concerns       Wheelchair 150 feet activity     Assist  Wheelchair 150 feet activity did not occur: Safety/medical concerns        Medical Problem List and Plan: 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 Completed 7-day course of Keppra for seizure prophylaxis. Follow-up Dr. Conchita ParisNundkumar  Continue CIR 2. 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 No current plans for long-term anticoagulation due to risk of fall/SDH.   Follow-up vascular study while on rehab -antiplatelet therapy: Lovenox 30 mg every 12 hours 3. Pain Management:Naprosyn 250 mg twice daily, Ultram as needed  Appears controlled on 5/2 4. Mood:Provide emotional support -antipsychotic agents: N/A 5. Neuropsych: This patientis notcapable of making decisions on herown behalf. 6. Skin/Wound Care:Routine skin checks 7. Fluids/Electrolytes/Nutrition:Routine in and outs 8. Right maxillary sinus, right lateral orbital wall and floor fractures with fat herniation. Follow-up outpatient Dr Leta Baptisthimmappa. No surgical intervention planned 9. 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 Follow-up as outpatient. 10. Hypertension. Norvasc 5 mg daily.  Controlled on 5/2  Monitor his increase mobility 11. Incidental finding of abnormal appearing  endometrium identified on MRI lumbar spine. Follow-up outpatient OB/GYN 12. Right distal comminuted distal radius fracture and minimally displaced radial head fracture. No surgical intervention at this time follow-up outpatient Dr. Merlyn LotKuzma.  -Nonweightbearing with splintRUE. -encourage pt to keep splint in place 13.  Acute blood loss anemia  Hemoglobin 11.1 on 4/29  Continue to monitor   LOS: 4 days A FACE TO FACE EVALUATION WAS PERFORMED  Marene Gilliam Karis Jubanil Ivey Nembhard 08/06/2020, 1:01 PM

## 2020-08-06 NOTE — Progress Notes (Signed)
Pt very confused, anxious, agitated, trying to get out of bed. Stating that there are "children running through the halls and making a lot of noise." Reoriented pt several times. Pt continues to be very paranoid, anxious, agitated. On call provided notified, no new orders. Bed alarm and tele-sitter in place for safety. Will continue to monitor.

## 2020-08-06 NOTE — Progress Notes (Signed)
Occupational Therapy Session Note  Patient Details  Name: Danielle Kirby MRN: [id number] Date of Birth: April 02, 1946  Today's Date: 08/06/2020 OT Individual Time: 0930-1030 OT Individual Time Calculation (min): 60 min    Short Term Goals: Week 1:  OT Short Term Goal 1 (Week 1): Patient will complete toilet transfer with max A +2 OT Short Term Goal 2 (Week 1): Patient will complete 1 step of UB dressing with max verbal cues OT Short Term Goal 3 (Week 1): Patient will complete sit<> stand with max A +2 in prep for BADL tasks.  Skilled Therapeutic Interventions/Progress Updates:    Patient in bed, alert, she denies pain, confused regarding where she is and reason for hospitalization.  Disoriented to date.  She is cooperative but requires ongoing reassurance and guidance for adl tasks.  Supine to sitting edge of bed with min A.  Sit to stand and SPT bed to w/c mod A with cues for directionality and hand placement (NWB right UE).  She completes oral care w/c level with CS and cues.  Washes face and arms/chest with set up and cues.  OH shirt min/mod A, c-style jacket min/mod A.  SPT to/from toilet max A.  toileting max A - continent/incontinent of urine due to urgency.  donns pants with mod A, CM in stance max A.  Perceptual skills WFL for dressing tasks, able to read (glasses on) small print and at distance without difficulty.  SPT w/c to recliner mod A.  Husband and son present at close of session.  She remained seated in recliner, seat belt alarm set and call bell in hand.    Therapy Documentation Precautions:  Precautions Precautions: Fall Precaution Comments: RUE sling and sugar tong splint Required Braces or Orthoses: Sling Restrictions Weight Bearing Restrictions: Yes RUE Weight Bearing: Non weight bearing   Therapy/Group: Individual Therapy  [name] 08/06/2020, 7:47 AM

## 2020-08-06 NOTE — Progress Notes (Signed)
Physical Therapy Session Note  Patient Details  Name: Danielle Kirby MRN: [id number] Date of Birth: 03-31-1946  Today's Date: 08/06/2020 PT Individual Time: 1302-1415 PT Individual Time Calculation (min): 73 min   Short Term Goals: Week 1:  PT Short Term Goal 1 (Week 1): Patient to demonstrate sustained attention to mobility tasks with mod cues 30% of the time. PT Short Term Goal 2 (Week 1): Patient to perform supine to sit with mod A with cues and increased time. PT Short Term Goal 3 (Week 1): Patient to perform bed/chair transfers with mod A within 5 minutes of initial cue. PT Short Term Goal 4 (Week 1): Patient to initate standing activities with +2 A with LRAD.  Skilled Therapeutic Interventions/Progress Updates:    Patient in room in recliner with significant other present.  Patient confabulating about a dinner party and her guests that were coming.  Patient able to state she was at [facility] and that she was told she fell.  Performed squat pivot to w/c with mod to max A for lifting assistance with step by step instructions for technique and encouragement as pt reports fearful.  Patient in w/c assist to therapy gym.  At rail in hallway, pt sit to stand with mod A.  Patient ambulated 2 x 10' with rail and w/c follow first, then able to walk to chair and turn to sit with mod cues for sequencing, assist for lateral weight shift as L LE with decreased strength.  Patient performed squat pivot to mat with mod/max cues and mod A.  Sit to supine min A for L LE.  Patient performed therex as noted below.  Performed supine to sit with mod A and cues.  Stand pivot to w/c with mod A and max cues.  Patient assisted in w/c to room.  Performed stand step to recliner with mod/max A and mod cues.  Patient seated for hip abduction with green t-band x 10.  Patient left seated in recliner with seat belt alarm active, call bell & needs in reach.   Therapy Documentation Precautions:   Precautions Precautions: Fall Precaution Comments: RUE sling and sugar tong splint Required Braces or Orthoses: Sling Restrictions Weight Bearing Restrictions: Yes RUE Weight Bearing: Non weight bearing Pain: Pain Assessment Pain Score: 0-No pain   Exercises: General Exercises - Lower Extremity Short Arc Quad: Strengthening;Both;10 reps;Supine Heel Slides: Strengthening;Both;10 reps;Supine (with assist on L at times) Hip Flexion/Marching: Strengthening;Both;10 reps;Supine (hooklying) Other Exercises Other Exercises: supine bridge x 10 Other Exercises: hooklying lateral trunk rotation x 10   Therapy/Group: Individual Therapy  Elray Mcgregor  Sheran Lawless, PT 08/06/2020, 1:57 PM

## 2020-08-07 DIAGNOSIS — G8918 Other acute postprocedural pain: Secondary | ICD-10-CM

## 2020-08-07 DIAGNOSIS — I10 Essential (primary) hypertension: Secondary | ICD-10-CM

## 2020-08-07 MED ORDER — QUETIAPINE FUMARATE 25 MG PO TABS
12.5000 mg | ORAL_TABLET | Freq: Every day | ORAL | Status: DC | PRN
  Administered 2020-08-07 – 2020-08-13 (×5): 12.5 mg via ORAL
  Filled 2020-08-07 (×10): qty 1

## 2020-08-07 NOTE — Progress Notes (Signed)
Physical Therapy Session Note  Patient Details  Name: Danielle Kirby MRN: [id number] Date of Birth: 19-Apr-1945  Today's Date: 08/07/2020 PT Individual Time: 1335-1430 PT Individual Time Calculation (min): 55 min   Short Term Goals: Week 1:  PT Short Term Goal 1 (Week 1): Patient to demonstrate sustained attention to mobility tasks with mod cues 30% of the time. PT Short Term Goal 2 (Week 1): Patient to perform supine to sit with mod A with cues and increased time. PT Short Term Goal 3 (Week 1): Patient to perform bed/chair transfers with mod A within 5 minutes of initial cue. PT Short Term Goal 4 (Week 1): Patient to initate standing activities with +2 A with LRAD.  Skilled Therapeutic Interventions/Progress Updates:    Patient in supine and initially RN in the room for patient care.  Patient performed supine to sit with S and increased time.  Seated EOB and shoes donned with max A.  Performed squat pivot to w/c with mod A and mod step by step cues.  Donned sling and continued education throughout session for NWB on R UE.  Assisted in w/c to therapy gym and using rail in hallway pt sit to stand with mod A.  Patient ambulated x 18' with mod A and max cues for sequencing.  Patient ambulated with QC with +2 A for w/c follow x 20' again with max cues for sequencing.  Patient negotiatead 3 steps (3") with R rail and +2 A for safety (mod A).  Performed side stepping in parallel bars with min to mod A and cues, then stepping forward and back over stick on the floor x 4 reps for foot clearance, balance, stride length.  Patient in w/c assisted to room.  Transfer via stand step with QC to recliner with mod A and mod to max cues.  Left in recliner with seatbelt alarm, call bell and needs in reach. Therapy Documentation Precautions:  Precautions Precautions: Fall Precaution Comments: RUE sling and sugar tong splint Required Braces or Orthoses: Sling Restrictions Weight Bearing Restrictions: Yes RUE Weight  Bearing: Non weight bearing Pain: Pain Assessment Pain Score: 0-No pain    Therapy/Group: Individual Therapy  Elray Mcgregor  Sheran Lawless, PT 08/07/2020, 4:40 PM

## 2020-08-07 NOTE — Progress Notes (Signed)
Speech Language Pathology Daily Session Note  Patient Details  Name: Danielle Kirby MRN: [id number] Date of Birth: 1945/08/23  Today's Date: 08/07/2020 SLP Individual Time: 1000-1100 SLP Individual Time Calculation (min): 60 min  Short Term Goals: Week 1: SLP Short Term Goal 1 (Week 1): Patient will initiate functional tasks in 50% of opportunities with Max A verbal cues. SLP Short Term Goal 2 (Week 1): Patient will demonstrate sustained attention to functional tasks for 2 minutes with Max verbal cues for redirection. SLP Short Term Goal 3 (Week 1): Patient will keep her eyes open for ~10 minute intervals with Max A multimodal cues. SLP Short Term Goal 4 (Week 1): Patient will demonstrate orientation to place, time and orientation with Max A multimodal cues. SLP Short Term Goal 5 (Week 1): Patient will maintain topic of conversation for ~3 turns with Max A verbal cues.  Skilled Therapeutic Interventions: Pt seen for skilled ST with focus on cognitive goals. Pt actively engaged with eyes open across 60 minute treatment session. Pt continues with tangential and perseverative speech throughout tx, cues required to redirect. Provided mod A cues, pt oriented x4 with use of external aids. SLP facilitating sustained attention task with picture cards requiring Max fading to Mod A for 3 minutes sustained attention. Pt significant other present toward end of session, educated on plan of care and pt performance. Cont ST POC.   Pain Pain Assessment Pain Scale: 0-10 Pain Score: 0-No pain  Therapy/Group: Individual Therapy  Tacey Ruiz 08/07/2020, 10:49 AM

## 2020-08-07 NOTE — Progress Notes (Signed)
Occupational Therapy Session Note  Patient Details  Name: Danielle Kirby MRN: [id number] Date of Birth: 04-Jul-1945  Today's Date: 08/07/2020 OT Individual Time: 1100-1200 OT Individual Time Calculation (min): 60 min    Short Term Goals: Week 1:  OT Short Term Goal 1 (Week 1): Patient will complete toilet transfer with max A +2 OT Short Term Goal 2 (Week 1): Patient will complete 1 step of UB dressing with max verbal cues OT Short Term Goal 3 (Week 1): Patient will complete sit<> stand with max A +2 in prep for BADL tasks.  Skilled Therapeutic Interventions/Progress Updates:    Patient in bed, husband present for session.  She is calm t/o session, requires ongoing cues to complete adl activity with limited choices.  She denies pain.  Disoriented to place or reason for hospitalization.  Supine to sitting edge of bed with CS.  Sit to stand and SPT bed to w/c with mod A.  Completes grooming tasks seated at sink with CS/set up and cues.  UB bathing with set up and cues.  OH shirt min A.  LB bathing w/c level with min A and cues.  LB dressing mod A.  Sit to stand at sink with min/mod A for adl tasks/tactile cues for NWB right UE.  She is able to cross legs for washing feet and donning socks/shoes. She responds well to completing one task at a time and limited verbal directions.  She remained seated in w/c at close of session with seat belt alarm set and call bell in hand.    Therapy Documentation Precautions:  Precautions Precautions: Fall Precaution Comments: RUE sling and sugar tong splint Required Braces or Orthoses: Sling Restrictions Weight Bearing Restrictions: Yes RUE Weight Bearing: Non weight bearing  Therapy/Group: Individual Therapy  [name] 08/07/2020, 7:35 AM

## 2020-08-07 NOTE — Progress Notes (Signed)
[facility] PHYSICAL MEDICINE & REHABILITATION PROGRESS NOTE  Subjective/Complaints: Patient seen laying in bed this AM.  Noted to be anxious overnight and Seroquel ordered.  She is sleepy this AM.    ROS: Unreliable due to cognition.   Objective: Vital Signs: Blood pressure 111/67, pulse 95, temperature 98.4 F (36.9 C), resp. rate 17, SpO2 98 %. No results found. No results for input(s): WBC, HGB, HCT, PLT in the last 72 hours. No results for input(s): NA, K, CL, CO2, GLUCOSE, BUN, CREATININE, CALCIUM in the last 72 hours.  Intake/Output Summary (Last 24 hours) at 08/07/2020 1020 Last data filed at 08/07/2020 4174 Gross per 24 hour  Intake 417 ml  Output --  Net 417 ml        Physical Exam: BP 111/67 (BP Location: Left Arm)   Pulse 95   Temp 98.4 F (36.9 C)   Resp 17   SpO2 98%   Constitutional: No distress . Vital signs reviewed. HENT: Normocephalic.  Atraumatic. Eyes: EOMI. No discharge. Cardiovascular: No JVD.   Respiratory: Normal effort.  No stridor.   GI: Non-distended.   Skin: Warm and dry.   Right upper extremity splint CDI Psych: Somnolent, limited by cognition Musc: Right wrist splinted with some edema and tenderness Mental Status: Somnolent Makes eye contact with examiner.  Limited insight and awareness.  Motor: LUE/LLE: 4/5 proximal distal, appears unchanged RUE: Shoulder abduction 4/5, distally limited by splint RLE: 4/5 proximal to distal, appears unchanged  Assessment/Plan: 1. Functional deficits which require 3+ hours per day of interdisciplinary therapy in a comprehensive inpatient rehab setting.  Physiatrist is providing close team supervision and 24 hour management of active medical problems listed below.  Physiatrist and rehab team continue to assess barriers to discharge/monitor patient progress toward functional and medical goals   Care Tool:  Bathing    Body parts bathed by patient: Right arm,Left arm,Front perineal area,Face   Body  parts bathed by helper: Buttocks     Bathing assist Assist Level: Minimal Assistance - Patient > 75%     Upper Body Dressing/Undressing Upper body dressing   What is the patient wearing?: Pull over shirt,Button up shirt    Upper body assist Assist Level: Minimal Assistance - Patient > 75%    Lower Body Dressing/Undressing Lower body dressing      What is the patient wearing?: Pants,Incontinence brief     Lower body assist Assist for lower body dressing: Maximal Assistance - Patient 25 - 49%     Toileting Toileting    Toileting assist Assist for toileting: Maximal Assistance - Patient 25 - 49%     Transfers Chair/bed transfer  Transfers assist     Chair/bed transfer assist level: Maximal Assistance - Patient 25 - 49%     Locomotion Ambulation   Ambulation assist   Ambulation activity did not occur: Safety/medical concerns  Assist level: Moderate Assistance - Patient 50 - 74% Assistive device: Other (comment) (wall rail) Max distance: 10   Walk 10 feet activity   Assist  Walk 10 feet activity did not occur: Safety/medical concerns  Assist level: Moderate Assistance - Patient - 50 - 74% Assistive device: Other (comment) (wall rail)   Walk 50 feet activity   Assist Walk 50 feet with 2 turns activity did not occur: Safety/medical concerns         Walk 150 feet activity   Assist Walk 150 feet activity did not occur: Safety/medical concerns         Walk  10 feet on uneven surface  activity   Assist Walk 10 feet on uneven surfaces activity did not occur: Safety/medical concerns         Wheelchair     Assist Will patient use wheelchair at discharge?: No   Wheelchair activity did not occur: Safety/medical concerns         Wheelchair 50 feet with 2 turns activity    Assist    Wheelchair 50 feet with 2 turns activity did not occur: Safety/medical concerns       Wheelchair 150 feet activity     Assist  Wheelchair  150 feet activity did not occur: Safety/medical concerns        Medical Problem List and Plan: 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 Completed 7-day course of Keppra for seizure prophylaxis. Follow-up Dr. Conchita Paris  Continue CIR  Seroquel started for agitation/anxiousness, dose decreased on 5/3 2. 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 No current plans for long-term anticoagulation due to risk of fall/SDH.   Follow-up vascular study next week -antiplatelet therapy: Lovenox 30 mg every 12 hours 3. Pain Management:Naprosyn 250 mg twice daily, Ultram as needed  Appears controlled on 5/3 4. Mood:Provide emotional support -antipsychotic agents: N/A 5. Neuropsych: This patientis notcapable of making decisions on herown behalf.  Telesitter for safety 6. Skin/Wound Care:Routine skin checks 7. Fluids/Electrolytes/Nutrition:Routine in and outs 8. Right maxillary sinus, right lateral orbital wall and floor fractures with fat herniation. Follow-up outpatient Dr Leta Baptist. No surgical intervention planned 9. 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 Follow-up as outpatient. 10. Hypertension. Norvasc 5 mg daily.  Controlled on 5/3  Monitor his increase mobility 11. Incidental finding of abnormal appearing endometrium identified on MRI lumbar spine. Follow-up outpatient OB/GYN 12. Right distal comminuted distal radius fracture and minimally displaced radial head fracture. No surgical intervention at this time follow-up outpatient Dr. Merlyn Lot.  -Nonweightbearing with splintRUE. -encourage pt to keep splint in place 13.  Acute blood loss anemia  Hemoglobin 11.1 on 4/29  Continue to monitor   LOS: 5 days A FACE TO FACE EVALUATION WAS PERFORMED  Nat Lowenthal Karis Juba 08/07/2020, 10:20 AM

## 2020-08-07 NOTE — Progress Notes (Signed)
IVT consult placed for Midline dressing change.  Added routine line care orders placed.

## 2020-08-08 NOTE — Progress Notes (Signed)
Occupational Therapy Session Note  Patient Details  Name: Danielle Kirby MRN: [id number] Date of Birth: 1946-01-06  Today's Date: 08/08/2020 OT Individual Time: 1884-1660 and 0920-1003 OT Individual Time Calculation (min): 14 min and 43 min   Short Term Goals: Week 1:  OT Short Term Goal 1 (Week 1): Patient will complete toilet transfer with max A +2 OT Short Term Goal 2 (Week 1): Patient will complete 1 step of UB dressing with max verbal cues OT Short Term Goal 3 (Week 1): Patient will complete sit<> stand with max A +2 in prep for BADL tasks.  Skilled Therapeutic Interventions/Progress Updates:    1) Treatment session limited by decreased arousal and fatigue.  Pt received in sidelying in bed requiring increased cues and orientation to place and situation to engage in self-care tasks.  Therapist presenting pt with clean clothes set out by family.  Pt stating it was "too early" and pt "cold" while pulling blankets over self.  Pt reports "too tired" to engage in any therapy at this time, despite encouragement.  Therapist exited with plan to return in ~15 mins.  2) Treatment session with focus on initiation, orientation, and functional transfers.  Pt received on toilet with RN present.  RN reports pt attempting to get up by herself to toilet.  Pt with decreased initiation while on toilet and demonstrating some paranoia vs confusion with pt asking who everyone was and why they were here.  Pt donned pants while seated on toilet with max-total assist due to decreased initiation this session.  Pt required mod assist sit > stand with facilitation for anterior weight shift. Pt completed hygiene with setup both seated and while standing, for thoroughness.  Pt completed stand pivot transfer to w/c Mod assist due to decreased initiation and sequencing with transfer.  Pt donned sweatshirt with mod assist.  Pt engaged in self-feeding with focus on visual scanning to locate all items, initiation with opening  items and sequencing with self-feeding.  Pt asking intermittent questions for assist in locating and problem solving.  Pt remained upright in w/c with seat belt alarm on and all needs in reach.  Therapy Documentation Precautions:  Precautions Precautions: Fall Precaution Comments: RUE sling and sugar tong splint Required Braces or Orthoses: Sling Restrictions Weight Bearing Restrictions: Yes RUE Weight Bearing: Non weight bearing General:   Vital Signs: Therapy Vitals BP: (!) 142/95 Pain: Pain Assessment Pain Scale: 0-10 Pain Score: 0-No pain   Therapy/Group: Individual Therapy  Rosalio Loud 08/08/2020, 9:48 AM

## 2020-08-08 NOTE — Progress Notes (Signed)
Physical Therapy Session Note  Patient Details  Name: Danielle Kirby MRN: [id number] Date of Birth: [birth year]  Today's Date: 08/08/2020 PT Individual Time: 1300-1400 PT Individual Time Calculation (min): 60 min   Short Term Goals: Week 1:  PT Short Term Goal 1 (Week 1): Patient to demonstrate sustained attention to mobility tasks with mod cues 30% of the time. PT Short Term Goal 2 (Week 1): Patient to perform supine to sit with mod A with cues and increased time. PT Short Term Goal 3 (Week 1): Patient to perform bed/chair transfers with mod A within 5 minutes of initial cue. PT Short Term Goal 4 (Week 1): Patient to initate standing activities with +2 A with LRAD.  Skilled Therapeutic Interventions/Progress Updates:    Patient in supine eating/finishing lunch.  Two ladies in the room and pt able to introduce them as "Myriam Jacobson" her sister and "Bonita Quin" (when asked affirmed also her sister.)  She reported she thought I already knew.  Patient supine to sit with CGA for initiation and follow through.  Donned shoes seated EOB with max A for time management.  Performed sit to stand with mod A and increased time, mod cues for technique.  Using QC stepping around to w/c with mod A for sequencing, balance, safety.  Pushed to gym in w/c.  Patient negotiated 8 (3") steps with rail on R and mod cues for sequencing, not using L UE and +2 mod A for safety.  Patient ambulated 72' with QC and mod A (+2 for w/c follow) with sequencing cues ("step1, step2, and 3 is the cane") to improve fluency of steps and consistency.  Patient stand step with QC to mat with mod A from w/c.  Performed sit<>stand x 10 reps from 23" height for improved ease of transfer and consistency with block practice.  Patient ambulated 3' to w/c with QC and min/mod A.  Transported to room and performed stand step with QC and min/mod A to recliner.  Left seated with belt alarm in place and call bell/needs in reach.  Therapy Documentation Precautions:   Precautions Precautions: Fall Precaution Comments: RUE sling and sugar tong splint Required Braces or Orthoses: Sling Restrictions Weight Bearing Restrictions: Yes RUE Weight Bearing: Non weight bearing Pain: Pain Assessment Pain Scale: 0-10 Pain Score: 0-No pain     Therapy/Group: Individual Therapy  Elray Mcgregor  Sheran Lawless, PT 08/08/2020, 4:29 PM

## 2020-08-08 NOTE — Progress Notes (Signed)
[place] PHYSICAL MEDICINE & REHABILITATION PROGRESS NOTE  Subjective/Complaints: Patient seen laying in bed this morning.  No reported issues overnight.  Limited engagement.  ROS: Unreliable due to cognition.   Objective: Vital Signs: Blood pressure (!) 142/95, pulse 79, temperature 98.3 F (36.8 C), resp. rate 17, SpO2 99 %. No results found. No results for input(s): WBC, HGB, HCT, PLT in the last 72 hours. No results for input(s): NA, K, CL, CO2, GLUCOSE, BUN, CREATININE, CALCIUM in the last 72 hours.  Intake/Output Summary (Last 24 hours) at 08/08/2020 1040 Last data filed at 08/08/2020 1004 Gross per 24 hour  Intake 570 ml  Output --  Net 570 ml        Physical Exam: BP (!) 142/95   Pulse 79   Temp 98.3 F (36.8 C)   Resp 17   SpO2 99%   Constitutional: No distress . Vital signs reviewed. HENT: Normocephalic.  Atraumatic. Eyes: EOMI. No discharge. Cardiovascular: No JVD.  RRR. Respiratory: Normal effort.  No stridor.  Bilateral clear to auscultation. GI: Non-distended.  BS +. Skin: Warm and dry.   Right upper extremity splint CDI Psych: Limited by mentation Musc: Right wrist splint with some edema and tenderness Mental Status: Alert Makes eye contact with examiner.  Limited insight and awareness.  Motor: LUE/LLE: 4/5 proximal distal, appears unchanged RUE: Shoulder abduction 4/5, distally limited by splint, appears unchanged RLE: 4/5 proximal to distal, appears unchanged  Assessment/Plan: 1. Functional deficits which require 3+ hours per day of interdisciplinary therapy in a comprehensive inpatient rehab setting.  Physiatrist is providing close team supervision and 24 hour management of active medical problems listed below.  Physiatrist and rehab team continue to assess barriers to discharge/monitor patient progress toward functional and medical goals   Care Tool:  Bathing    Body parts bathed by patient: Right arm,Left arm,Chest,Abdomen,Front  perineal area,Right upper leg,Left upper leg,Right lower leg,Left lower leg,Face   Body parts bathed by helper: Buttocks,Right upper leg,Left upper leg,Right lower leg,Left lower leg     Bathing assist Assist Level: Minimal Assistance - Patient > 75%     Upper Body Dressing/Undressing Upper body dressing   What is the patient wearing?: Pull over shirt    Upper body assist Assist Level: Moderate Assistance - Patient 50 - 74%    Lower Body Dressing/Undressing Lower body dressing      What is the patient wearing?: Pants,Incontinence brief     Lower body assist Assist for lower body dressing: Total Assistance - Patient < 25%     Toileting Toileting    Toileting assist Assist for toileting: Maximal Assistance - Patient 25 - 49%     Transfers Chair/bed transfer  Transfers assist     Chair/bed transfer assist level: Maximal Assistance - Patient 25 - 49%     Locomotion Ambulation   Ambulation assist   Ambulation activity did not occur: Safety/medical concerns  Assist level: Moderate Assistance - Patient 50 - 74% Assistive device: Other (comment) (wall rail) Max distance: 10   Walk 10 feet activity   Assist  Walk 10 feet activity did not occur: Safety/medical concerns  Assist level: Moderate Assistance - Patient - 50 - 74% Assistive device: Other (comment) (wall rail)   Walk 50 feet activity   Assist Walk 50 feet with 2 turns activity did not occur: Safety/medical concerns         Walk 150 feet activity   Assist Walk 150 feet activity did not occur: Safety/medical concerns  Walk 10 feet on uneven surface  activity   Assist Walk 10 feet on uneven surfaces activity did not occur: Safety/medical concerns         Wheelchair     Assist Will patient use wheelchair at discharge?: No   Wheelchair activity did not occur: Safety/medical concerns         Wheelchair 50 feet with 2 turns activity    Assist    Wheelchair 50  feet with 2 turns activity did not occur: Safety/medical concerns       Wheelchair 150 feet activity     Assist  Wheelchair 150 feet activity did not occur: Safety/medical concerns        Medical Problem List and Plan: 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 Completed 7-day course of Keppra for seizure prophylaxis. Follow-up Dr. Conchita Paris  Continue CIR  Seroquel started for agitation/anxiousness, dose decreased on 5/3  Team conference today to discuss current and goals and coordination of care, home and environmental barriers, and discharge planning with nursing, case manager, and therapies. Please see conference note from today as well.  2. 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 No current plans for long-term anticoagulation due to risk of fall/SDH.   Follow-up vascular study next week -antiplatelet therapy: Lovenox 30 mg every 12 hours 3. Pain Management:Naprosyn 250 mg twice daily, Ultram as needed  Appears controlled on 5/4 4. Mood:Provide emotional support -antipsychotic agents: N/A 5. Neuropsych: This patientis notcapable of making decisions on herown behalf.  Telesitter for safety 6. Skin/Wound Care:Routine skin checks 7. Fluids/Electrolytes/Nutrition:Routine in and outs 8. Right maxillary sinus, right lateral orbital wall and floor fractures with fat herniation. Follow-up outpatient Dr Leta Baptist. No surgical intervention planned 9. 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 Follow-up as outpatient. 10. Hypertension. Norvasc 5 mg daily.  Controlled on 5/4  Monitor his increase mobility 11. Incidental finding of abnormal appearing endometrium identified on MRI lumbar spine. Follow-up outpatient OB/GYN 12. Right distal comminuted distal radius fracture and minimally displaced radial head fracture. No surgical intervention at this time follow-up outpatient Dr.  Merlyn Lot.  -Nonweightbearing with splintRUE. -encourage pt to keep splint in place 13.  Acute blood loss anemia  Hemoglobin 11.1 on 4/29, plan order labs at the end of this week  Continue to monitor  LOS: 6 days A FACE TO FACE EVALUATION WAS PERFORMED  Lakeya Mulka Karis Juba 08/08/2020, 10:40 AM

## 2020-08-08 NOTE — Progress Notes (Signed)
Patient ID: Danielle Kirby, female   DOB: 31-Mar-1946, 75 y.o.   MRN: [id number]  Met with pt and her two sisters visiting in the room, but also contacted Danielle Kirby-Boyfriend to update on team conference goals of CGA and target discharge date of 5/26. He feels she needs all of the rehab/therapies she can get due to her BI. He will be happy to come in when needed and will be the one providing care to her at discharge. Sister's were surprised at her responses report she is not usually like this. Danielle Kirby is aware and will be here later today.

## 2020-08-08 NOTE — Progress Notes (Signed)
Speech Language Pathology TBI Note  Patient Details  Name: Danielle Kirby MRN: [id number] Date of Birth: 06/13/1945  Today's Date: 08/08/2020 SLP Individual Time: 1400-1500 SLP Individual Time Calculation (min): 60 min  Short Term Goals: Week 1: SLP Short Term Goal 1 (Week 1): Patient will initiate functional tasks in 50% of opportunities with Max A verbal cues. SLP Short Term Goal 2 (Week 1): Patient will demonstrate sustained attention to functional tasks for 2 minutes with Max verbal cues for redirection. SLP Short Term Goal 3 (Week 1): Patient will keep her eyes open for ~10 minute intervals with Max A multimodal cues. SLP Short Term Goal 4 (Week 1): Patient will demonstrate orientation to place, time and orientation with Max A multimodal cues. SLP Short Term Goal 5 (Week 1): Patient will maintain topic of conversation for ~3 turns with Max A verbal cues.  Skilled Therapeutic Interventions: Pt seen for skilled ST with focus on cognitive goals, pt upright in chair and agreeable to tx session. Pt remains extremely verbose and tangential throughout tx session, frequent cues to attempt to redirect to task. Pt with no recall of recent visit from sisters. SLP facilitating simple sorting task with patient requiring Max A multimodal cues. Max A multimodal cues required to increase orientation, pt stating it is September 2023 and her age is 68. SLP initiating memory notebook to attempt to increase awareness, recall and carryover of therapeutic and daily activities for pt and family members. Pt attempting to dial phone, unable to recall or sequencing correct number for [facility]. Pt provided Max assist to dial number correctly. Pt left in chair with chair alarm set and all needs within reach. Cont ST POC.   Pain Pain Assessment Pain Scale: 0-10 Pain Score: 0-No pain  Agitated Behavior Scale: TBI Observation Details Observation Environment: pt room Start of observation period - Date: 08/07/20 Start  of observation period - Time: 1400 End of observation period - Date: 08/07/20 End of observation period - Time: 1500 Agitated Behavior Scale (DO NOT LEAVE BLANKS) Short attention span, easy distractibility, inability to concentrate: Present to a moderate degree Impulsive, impatient, low tolerance for pain or frustration: Absent Uncooperative, resistant to care, demanding: Absent Violent and/or threatening violence toward people or property: Absent Explosive and/or unpredictable anger: Absent Rocking, rubbing, moaning, or other self-stimulating behavior: Absent Pulling at tubes, restraints, etc.: Absent Wandering from treatment areas: Absent Restlessness, pacing, excessive movement: Absent Repetitive behaviors, motor, and/or verbal: Present to a slight degree Rapid, loud, or excessive talking: Absent Sudden changes of mood: Absent Easily initiated or excessive crying and/or laughter: Absent Self-abusiveness, physical and/or verbal: Absent Agitated behavior scale total score: 17  Therapy/Group: Individual Therapy  Tacey Ruiz 08/08/2020, 2:53 PM

## 2020-08-08 NOTE — Patient Care Conference (Signed)
Inpatient RehabilitationTeam Conference and Plan of Care Update Date: 08/08/2020   Time: 11:35 AM    Patient Name: Danielle Kirby      Medical Record Number: [id number]  Date of Birth: 1945/07/02 Sex: Female         Room/Bed: 4W17C/4W17C-01 Payor Info: Payor: MEDICARE / Plan: MEDICARE PART A / Product Type: *No Product type* /    Admit Date/Time:  08/02/2020  6:04 PM  Primary Diagnosis:  TBI (traumatic brain injury) [facility])  Hospital Problems: Principal Problem:   TBI (traumatic brain injury) (HCC) Active Problems:   Radial head fracture   Acute blood loss anemia   Essential hypertension   Traumatic injury of head with altered mental status   Post-operative pain   Benign essential HTN    Expected Discharge Date: Expected Discharge Date: 08/30/20  Team Members Present: Physician leading conference: Dr. Maryla Morrow Care Coodinator Present: Chana Bode, RN, BSN, CRRN;Becky Dupree, LCSW Nurse Present: Chana Bode, RN PT Present: Sheran Lawless, PT OT Present: Rosalio Loud, OT SLP Present: Colin Benton, SLP PPS Coordinator present : Edson Snowball, PT     Current Status/Progress Goal Weekly Team Focus  Bowel/Bladder   Pt cont of B/B. LBM reported 4/30 with smears.  Pt gain regularity of Bowel.  [place] pt q2h and PRN.  Offer pt PRN medication.   Swallow/Nutrition/ Hydration             ADL's   bed mobility CS, SPT min/mod A, UB adl min A, LB adl mod A - disoriented, confused but cooperative for therapy with reassurance and limited distractions/choices  CS  functional transfers, seated and standing balance, adl training, cog/safety, family education   Mobility   mod to max A transfers, mod to S bed mobility depending on cognition, ambulates up to 20' with wall rail vs QC and mod to +2 A.  Negotiated 3 small steps with 1 rail and +2 mod A for safety  S/CGA overall  attention, ambulation, safety, reinforcing precautions, awareness of deficits, activity tolerance   Communication              Safety/Cognition/ Behavioral Observations  Max-Mod A  Mod-Min A  orientation, sustained attention, basic problem solving and recall   Pain   Pt states pain 0 out of 10.  Pt remains pain free  Assess for pain qshift and offer PRN medications as needed   Skin   Skin is Clean and intact free of breakdown or injury.  Skin remains free of breakdown or injury  Assess skin qshift and preform skin care     Discharge Planning:  Home with significant other-[name] who is here daily. Aware pt will require 24/7 care at discharge.   Team Discussion: More alert than previous note however is confused, disoriented and paranoid. Note pain and HTN controlled with current treatments. Progress limited by confusion, dis-orientation, poor initiation, poor sequencing and poor attention.  Patient on target to meet rehab goals: Currently mod - max assist for toileting. Able to ambulate with a quad care and mod assist for 20' and manage 3 steps. Goals set for supervision - CGA for discharge.  *See Care Plan and progress notes for long and short-term goals.   Revisions to Treatment Plan:  SLP working on orientation, basic proble solving and increased attention Teaching Needs: Transfers, toileting, safety measures, secondary stroke risk management,medication management, etc  Current Barriers to Discharge: Decreased caregiver support, Home enviroment access/layout and Incontinence  Possible Resolutions to Barriers: Family education  Medical Summary Current Status: TBI/SDH secondary to multiple falls.  Barriers to Discharge: Behavior;Incontinence;Medical stability;Weight bearing restrictions;Other (comments)  Barriers to Discharge Comments: Age indeterminate DVT Possible Resolutions to Barriers/Weekly Focus: Therapies, follow BP, follow labs - Hb, RUE splint, DVT U/S next week   Continued Need for Acute Rehabilitation Level of Care: The patient requires daily medical management by a physician with  specialized training in physical medicine and rehabilitation for the following reasons: Direction of a multidisciplinary physical rehabilitation program to maximize functional independence : Yes Medical management of patient stability for increased activity during participation in an intensive rehabilitation regime.: Yes Analysis of laboratory values and/or radiology reports with any subsequent need for medication adjustment and/or medical intervention. : Yes   I attest that I was present, lead the team conference, and concur with the assessment and plan of the team.   Chana Bode B 08/08/2020, 2:26 PM

## 2020-08-09 DIAGNOSIS — S0990XA Unspecified injury of head, initial encounter: Secondary | ICD-10-CM

## 2020-08-09 DIAGNOSIS — R4182 Altered mental status, unspecified: Secondary | ICD-10-CM

## 2020-08-09 DIAGNOSIS — I82452 Acute embolism and thrombosis of left peroneal vein: Secondary | ICD-10-CM

## 2020-08-09 NOTE — Progress Notes (Signed)
Physical Therapy Session Note  Patient Details  Name: Danielle Kirby MRN: [id number] Date of Birth: 30-Nov-1945  Today's Date: 08/09/2020 PT Individual Time: 1300-1345 PT Individual Time Calculation (min): 45 min   Short Term Goals: Week 1:  PT Short Term Goal 1 (Week 1): Patient to demonstrate sustained attention to mobility tasks with mod cues 30% of the time. PT Short Term Goal 2 (Week 1): Patient to perform supine to sit with mod A with cues and increased time. PT Short Term Goal 3 (Week 1): Patient to perform bed/chair transfers with mod A within 5 minutes of initial cue. PT Short Term Goal 4 (Week 1): Patient to initate standing activities with +2 A with LRAD.  Skilled Therapeutic Interventions/Progress Updates:    Patient in supine and denies pain.  Performed supine to sit with S and increased time after cues.  Patient seated to don sweatshirt with mod A and donned shoes max A for time management.  Patient sit to stand with min A to transfer to w/c with QC and min A.  Patient transported in w/c to ortho gym.  Discussed vehicles and she reports her spouse has an SUV.  Performed car transfer to simulated small SUV height with mod A and cues for sequencing using cane with ambulatory transfers.  Patient negotiated ramp with QC and min to mod A.  Patient performed standing at [facility] for tapping targets over screen with mod cues fading to max cues with CGA and intermittent 1 UE support needing cues to complete the 2 minute task due to fatigue from standing.  Patient then performed memory task tapping pictures after words seen and spoken up to 3 word list with mod to max cues as pt wanting to sit prior to completing the 1 minute task.  Completed memory task in sitting up to 4 word list, but pt unable to keep focus any longer on task.  Transported in w/c to therapy gym.  Patient sit to stand to QC with min A and ambulated x 40' with min to mod A mod cues for safety with cane placement.  Patient assisted in  w/c to room and transfer to recliner.  Left in recliner with alarm belt active, call bell and needs in reach and spouse in the room.   Therapy Documentation Precautions:  Precautions Precautions: Fall Precaution Comments: RUE sling and sugar tong splint Required Braces or Orthoses: Sling Restrictions Weight Bearing Restrictions: Yes RUE Weight Bearing: Non weight bearing Pain: Pain Assessment Pain Score: 0-No pain    Therapy/Group: Individual Therapy  Elray Mcgregor  [place], PT 08/09/2020, 5:21 PM

## 2020-08-09 NOTE — Progress Notes (Signed)
Nutrition Follow-up  DOCUMENTATION CODES:   Not applicable  INTERVENTION:   - Please obtain CIR admission weight  - Ensure Enlive po BID, each supplement provides 350 kcal and 20 grams of protein  - MVI with minerals daily  NUTRITION DIAGNOSIS:   Increased nutrient needs related to other (TBI) as evidenced by estimated needs.  Ongoing  GOAL:   Patient will meet greater than or equal to 90% of their needs  Progressing  MONITOR:   PO intake,Supplement acceptance,Weight trends  REASON FOR ASSESSMENT:   Malnutrition Screening Tool    ASSESSMENT:   75 year old female with PMH of cataracts and osteoarthritis. Presented 07/26/20 after reports of multiple falls. Pt found to have SDH and facial fractures. Pt also with right distal radius fracture and minimally displaced radial head fracture. Admitted to CIR on 4/28.  Noted target d/c date of 5/26.  Attempted to speak with pt at bedside. However, pt sleeping soundly and did not wake up. Noted pt with an Ensure supplement in cup on tray table along with sweet tea that appears to have been brought in by family.  PO intake has been good, averaging 80%. Pt has been accepting most Ensure Enlive supplements per [facility] documentation. Will continue with current regimen.  Meal Completion: 40-100% x last 8 documented meals (averaging 80%)  Medications reviewed and include: colace, Ensure Enlive BID, MVI with minerals  Labs reviewed.  Diet Order:   Diet Order            Diet regular Room service appropriate? Yes with Assist; Fluid consistency: Thin  Diet effective now                 EDUCATION NEEDS:   No education needs have been identified at this time  Skin:  Skin Assessment: Reviewed RN Assessment  Last BM:  08/08/20 large type 4  Height:   Ht Readings from Last 1 Encounters:  07/26/20 5\' 7"  (1.702 m)    Weight:   Wt Readings from Last 1 Encounters:  07/26/20 83.6 kg    BMI:  28.86  Estimated Nutritional  Needs:   Kcal:  1700-1900  Protein:  85-100 grams  Fluid:  1.7 L/day    07/28/20, MS, RD, LDN Inpatient Clinical Dietitian Please see [facility] for contact information.

## 2020-08-09 NOTE — Progress Notes (Signed)
Physical Therapy TBI Note  Patient Details  Name: Danielle Kirby MRN: [id number] Date of Birth: 04/09/1945  Today's Date: 08/09/2020 PT Individual Time: 1005-1115 PT Individual Time Calculation (min): 70 min   Short Term Goals: Week 1:  PT Short Term Goal 1 (Week 1): Patient to demonstrate sustained attention to mobility tasks with mod cues 30% of the time. PT Short Term Goal 2 (Week 1): Patient to perform supine to sit with mod A with cues and increased time. PT Short Term Goal 3 (Week 1): Patient to perform bed/chair transfers with mod A within 5 minutes of initial cue. PT Short Term Goal 4 (Week 1): Patient to initate standing activities with +2 A with LRAD.  Skilled Therapeutic Interventions/Progress Updates:     Pt received sitting upright in w/c, significant other, [name], at bedside. Pt reports no pain. Limited eye contact with therapist throughout session. Pt oriented x3, disoriented to time ("2033"). She was able to remove hospital socks with supervision and cues for figure-4 technique. She was able to don her regular socks with setupA in similar fashion. Donned tennis-shoes with totalA for time management.   W/c transport for time management to main rehab gym.  Stand<>pivot transfer with modA from w/c to mat table - cues for NWB compliance on RUE and cues needed for stepping sequencing and technique. Pt seeming confused regarding simple instructions and somewhat perseverative on therapist asking her to "do too much."  Performed repeated sit<>stands from mat table height with therapist on stool and her LUE on therapist shoulder for support. Assist level fading from modA to LIGHT minA (!). However, she does rely heavily on the back of her legs during this transition.  Incorporated task overlay for repeated sit<>stands with card matching to back of mirror - pt needs modA for identifying and accuracy with card matching but with distractions for task overlay, her sit<>stand transfer was  actually improved with better initiation, still requiring light minA.   Performed standing toe taps with minA for balance onto 4inch platform with RLE with unilateral support from RUE on therapist shoulder (therapist on stool). Instructed her to perform the same task with her LLE but she was only able to complete 2 reps before sitting due to fatigue and fear of falling.  Functional gait training 2x8ft (seated rest) with minA and quad cane - demo's significantly decreased gait speed with step to pattern and needing continues cues for stepping/cane pattern.   Towards end of session, pt unable to recall events performed in this session (card matching, alternating toe taps on block, etc), impaired STM.  Performed squat<>pivot with min/modA back to her w/c (cues needed for NWB compliance) and then pt wheeled back to room with totalA. Safety belt alarm on and needs within reach, significant other at bedside.   Therapy Documentation Precautions:  Precautions Precautions: Fall Precaution Comments: RUE sling and sugar tong splint Required Braces or Orthoses: Sling Restrictions Weight Bearing Restrictions: Yes RUE Weight Bearing: Non weight bearing  Agitated Behavior Scale: TBI   Agitated Behavior Scale (DO NOT LEAVE BLANKS) Short attention span, easy distractibility, inability to concentrate: Present to a moderate degree Impulsive, impatient, low tolerance for pain or frustration: Absent Uncooperative, resistant to care, demanding: Absent Violent and/or threatening violence toward people or property: Absent Explosive and/or unpredictable anger: Absent Rocking, rubbing, moaning, or other self-stimulating behavior: Absent Pulling at tubes, restraints, etc.: Absent Wandering from treatment areas: Absent Restlessness, pacing, excessive movement: Absent Repetitive behaviors, motor, and/or verbal: Present to a slight  degree Rapid, loud, or excessive talking: Absent Sudden changes of mood:  Absent Easily initiated or excessive crying and/or laughter: Absent Self-abusiveness, physical and/or verbal: Absent Agitated behavior scale total score: 17  Therapy/Group: Individual Therapy  Raheen Capili P Vineet Kinney PT 08/09/2020, 11:13 AM

## 2020-08-09 NOTE — Progress Notes (Signed)
Speech Language Pathology Daily Session Note  Patient Details  Name: Danielle Kirby MRN: [id number] Date of Birth: 04-11-1945  Today's Date: 08/09/2020 SLP Individual Time: 0730-0830 SLP Individual Time Calculation (min): 60 min  Short Term Goals: Week 1: SLP Short Term Goal 1 (Week 1): Patient will initiate functional tasks in 50% of opportunities with Max A verbal cues. SLP Short Term Goal 2 (Week 1): Patient will demonstrate sustained attention to functional tasks for 2 minutes with Max verbal cues for redirection. SLP Short Term Goal 3 (Week 1): Patient will keep her eyes open for ~10 minute intervals with Max A multimodal cues. SLP Short Term Goal 4 (Week 1): Patient will demonstrate orientation to place, time and orientation with Max A multimodal cues. SLP Short Term Goal 5 (Week 1): Patient will maintain topic of conversation for ~3 turns with Max A verbal cues.  Skilled Therapeutic Interventions: Skilled SLP intervention focused on cognition. Pt alert and attended to slp in conversation most of session but demonstrated confused speech. She was peseverative on saying "theres a lot of kids around here" She completed sustain attention card sorting task of moderate complexity. Consistent direction needed to compete task entirely and with organization during task. Max A with visual and verbal cues needed to orient to date on 2/3 opportunities with slp and MD discussing date with patient. Improvement in orientation when given choice of 3 for month and day of the week. Max A to recall purpose and how to use call button this session. Cont with therapy per plan of care. Pt left seated upright in bed with bed alarm set and call button with reach.       Pain Pain Assessment Pain Scale: Faces Faces Pain Scale: No hurt  Therapy/Group: Individual Therapy  Danielle Kirby 08/09/2020, 8:30 AM

## 2020-08-09 NOTE — Progress Notes (Signed)
Occupational Therapy TBI Note  Patient Details  Name: Danielle Kirby MRN: [id number] Date of Birth: Jul 10, 1945  Today's Date: 08/09/2020 OT Individual Time: 0903-1000 OT Individual Time Calculation (min): 57 min    Short Term Goals: Week 1:  OT Short Term Goal 1 (Week 1): Patient will complete toilet transfer with max A +2 OT Short Term Goal 2 (Week 1): Patient will complete 1 step of UB dressing with max verbal cues OT Short Term Goal 3 (Week 1): Patient will complete sit<> stand with max A +2 in prep for BADL tasks.  Skilled Therapeutic Interventions/Progress Updates:    1;1. Pt received in bed agreeable to OT. Pt with significant STM recall deficits impacting ability to recall tasks performed/planned to do during session or recall that family is not able to assist pt with bathing in rehab for safty. Pt demo improved initiation based on other notes this date, but demos decreased termination skills as evidenced by prolonged grooming (attempting to repeat multiple times). Pt completes stand pivot transfers and sit to stands with LUE on grab bar, arm rest of w/c and sink with MOD A for power up and occasional counting for initiation. Pt able to transfer to toilet as stated previously and requires total A for clothing management, but completes peri hygiene seated with S. LB dressing completed sit to stand at sink with direct VC and eventually HOH A to initiate threading first LE instead of folding pants up. Pt able to thread second LE. RN in room at end of session administering meds. Exited session with pt seated in w/c, exit alarm on and call light in reach  Therapy Documentation Precautions:  Precautions Precautions: Fall Precaution Comments: RUE sling and sugar tong splint Required Braces or Orthoses: Sling Restrictions Weight Bearing Restrictions: Yes RUE Weight Bearing: Non weight bearing General:   Vital Signs:  Pain: Pain Assessment Pain Scale: Faces Faces Pain Scale: No  hurt Agitated Behavior Scale: TBI Observation Details Observation Environment: pt room Start of observation period - Date: 08/09/20 Start of observation period - Time: 0900 End of observation period - Date: 08/09/20 End of observation period - Time: 1000 Agitated Behavior Scale (DO NOT LEAVE BLANKS) Short attention span, easy distractibility, inability to concentrate: Present to a moderate degree Impulsive, impatient, low tolerance for pain or frustration: Absent Uncooperative, resistant to care, demanding: Absent Violent and/or threatening violence toward people or property: Absent Explosive and/or unpredictable anger: Absent Rocking, rubbing, moaning, or other self-stimulating behavior: Absent Pulling at tubes, restraints, etc.: Absent Wandering from treatment areas: Absent Restlessness, pacing, excessive movement: Absent Repetitive behaviors, motor, and/or verbal: Present to a slight degree Rapid, loud, or excessive talking: Absent Sudden changes of mood: Absent Easily initiated or excessive crying and/or laughter: Absent Self-abusiveness, physical and/or verbal: Absent Agitated behavior scale total score: 17    Therapy/Group: Individual Therapy  Shon Hale 08/09/2020, 9:59 AM

## 2020-08-09 NOTE — Progress Notes (Signed)
Danielle Kirby PHYSICAL MEDICINE & REHABILITATION PROGRESS NOTE  Subjective/Complaints: Patient seen sitting up in bed this morning working with therapies.  She states she slept well overnight.  She is much more alert, oriented, and engaging this morning.  Discussed cognition with therapies.  ROS: Denies CP, SOB, N/V/D  Objective: Vital Signs: Blood pressure (!) 156/79, pulse 90, temperature 97.8 F (36.6 C), temperature source Oral, resp. rate 20, SpO2 100 %. No results found. No results for input(s): WBC, HGB, HCT, PLT in the last 72 hours. No results for input(s): NA, K, CL, CO2, GLUCOSE, BUN, CREATININE, CALCIUM in the last 72 hours.  Intake/Output Summary (Last 24 hours) at 08/09/2020 0930 Last data filed at 08/09/2020 0847 Gross per 24 hour  Intake 720 ml  Output --  Net 720 ml        Physical Exam: BP (!) 156/79 (BP Location: Left Arm)   Pulse 90   Temp 97.8 F (36.6 C) (Oral)   Resp 20   SpO2 100%   Constitutional: No distress . Vital signs reviewed. HENT: Normocephalic.  Atraumatic. Eyes: EOMI. No discharge. Cardiovascular: No JVD.  RRR. Respiratory: Normal effort.  No stridor.  Bilateral clear to auscultation. GI: Non-distended.  BS +. Skin: Warm and dry.   Right upper extremity splint CDI Psych: Somewhat confused and apprehensive. Musc: Right wrist splint with some edema and tenderness Mental Status: Alert Makes eye contact with examiner.  Limited insight and awareness.  Motor: LUE/LLE: 4-4+/5 proximal distal, appears unchanged RUE: Shoulder abduction 4/5, distally limited by splint, appears unchanged RLE: 4/5 proximal to distal, appears unchanged  Assessment/Plan: 1. Functional deficits which require 3+ hours per day of interdisciplinary therapy in a comprehensive inpatient rehab setting.  Physiatrist is providing close team supervision and 24 hour management of active medical problems listed below.  Physiatrist and rehab team continue to assess barriers  to discharge/monitor patient progress toward functional and medical goals   Care Tool:  Bathing    Body parts bathed by patient: Right arm,Left arm,Chest,Abdomen,Front perineal area,Right upper leg,Left upper leg,Right lower leg,Left lower leg,Face   Body parts bathed by helper: Buttocks,Right upper leg,Left upper leg,Right lower leg,Left lower leg     Bathing assist Assist Level: Minimal Assistance - Patient > 75%     Upper Body Dressing/Undressing Upper body dressing   What is the patient wearing?: Pull over shirt    Upper body assist Assist Level: Moderate Assistance - Patient 50 - 74%    Lower Body Dressing/Undressing Lower body dressing      What is the patient wearing?: Pants,Incontinence brief     Lower body assist Assist for lower body dressing: Total Assistance - Patient < 25%     Toileting Toileting    Toileting assist Assist for toileting: Maximal Assistance - Patient 25 - 49%     Transfers Chair/bed transfer  Transfers assist     Chair/bed transfer assist level: Moderate Assistance - Patient 50 - 74%     Locomotion Ambulation   Ambulation assist   Ambulation activity did not occur: Safety/medical concerns  Assist level: 2 helpers Assistive device: Cane-quad Max distance: 20   Walk 10 feet activity   Assist  Walk 10 feet activity did not occur: Safety/medical concerns  Assist level: Moderate Assistance - Patient - 50 - 74% Assistive device: Cane-quad   Walk 50 feet activity   Assist Walk 50 feet with 2 turns activity did not occur: Safety/medical concerns         Walk  150 feet activity   Assist Walk 150 feet activity did not occur: Safety/medical concerns         Walk 10 feet on uneven surface  activity   Assist Walk 10 feet on uneven surfaces activity did not occur: Safety/medical concerns         Wheelchair     Assist Will patient use wheelchair at discharge?: No   Wheelchair activity did not occur:  Safety/medical concerns         Wheelchair 50 feet with 2 turns activity    Assist    Wheelchair 50 feet with 2 turns activity did not occur: Safety/medical concerns       Wheelchair 150 feet activity     Assist  Wheelchair 150 feet activity did not occur: Safety/medical concerns        Medical Problem List and Plan: 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 Completed 7-day course of Keppra for seizure prophylaxis. Follow-up Dr. Conchita Paris  Continue CIR  Seroquel started for agitation/anxiousness, dose decreased on 5/3  Team conference today to discuss current and goals and coordination of care, home and environmental barriers, and discharge planning with nursing, case manager, and therapies. Please see conference note from today as well.  2. 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 No current plans for long-term anticoagulation due to risk of fall/SDH.   Follow-up vascular study next week -antiplatelet therapy: Lovenox 30 mg every 12 hours 3. Pain Management:Naprosyn 250 mg twice daily, Ultram as needed  Appears controlled on 5/5 4. Mood:Provide emotional support -antipsychotic agents: N/A 5. Neuropsych: This patientis notcapable of making decisions on herown behalf.  Telesitter for safety 6. Skin/Wound Care:Routine skin checks 7. Fluids/Electrolytes/Nutrition:Routine in and outs 8. Right maxillary sinus, right lateral orbital wall and floor fractures with fat herniation. Follow-up outpatient Dr Leta Baptist. No surgical intervention planned 9. 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 Follow-up as outpatient. 10. Hypertension. Norvasc 5 mg daily.  Controlled on 5/5  Monitor his increase mobility 11. Incidental finding of abnormal appearing endometrium identified on MRI lumbar spine. Follow-up outpatient OB/GYN 12. Right distal comminuted distal radius fracture  and minimally displaced radial head fracture. No surgical intervention at this time follow-up outpatient Dr. Merlyn Lot.  -Nonweightbearing with splintRUE. -encourage pt to keep splint in place 13.  Acute blood loss anemia  Hemoglobin 11.1 on 4/29, labs ordered for tomorrow  Continue to monitor  LOS: 7 days A FACE TO FACE EVALUATION WAS PERFORMED  Danielle Kirby 08/09/2020, 9:30 AM

## 2020-08-10 LAB — CBC WITH DIFFERENTIAL/PLATELET
Abs Immature Granulocytes: 0.01 K/uL (ref 0.00–0.07)
Basophils Absolute: 0 K/uL (ref 0.0–0.1)
Basophils Relative: 1 %
Eosinophils Absolute: 0.2 K/uL (ref 0.0–0.5)
Eosinophils Relative: 5 %
HCT: 33.1 % — ABNORMAL LOW (ref 36.0–46.0)
Hemoglobin: 10.4 g/dL — ABNORMAL LOW (ref 12.0–15.0)
Immature Granulocytes: 0 %
Lymphocytes Relative: 47 %
Lymphs Abs: 2.1 K/uL (ref 0.7–4.0)
MCH: 27.3 pg (ref 26.0–34.0)
MCHC: 31.4 g/dL (ref 30.0–36.0)
MCV: 86.9 fL (ref 80.0–100.0)
Monocytes Absolute: 0.4 K/uL (ref 0.1–1.0)
Monocytes Relative: 9 %
Neutro Abs: 1.7 K/uL (ref 1.7–7.7)
Neutrophils Relative %: 38 %
Platelets: 338 K/uL (ref 150–400)
RBC: 3.81 MIL/uL — ABNORMAL LOW (ref 3.87–5.11)
RDW: 14.4 % (ref 11.5–15.5)
WBC: 4.5 K/uL (ref 4.0–10.5)
nRBC: 0 % (ref 0.0–0.2)

## 2020-08-10 NOTE — Progress Notes (Signed)
Occupational Therapy Weekly Progress Note  Patient Details  Name: Danielle Kirby MRN: [id number] Date of Birth: [birth year]  Beginning of progress report period: August 03, 2020 End of progress report period: Aug 10, 2020  Today's Date: 08/10/2020 OT Individual Time: 1445-1530 OT Individual Time Calculation (min): 45 min    Patient has met 3 of 3 short term goals.  Pt made excellent progress from evaluation this reporting period. Pt currently mod A toileting, max A LBD and UBD, min A bathing, and requires increased vc's due to deficits in functional cognition (initiation/termination, memory, understanding).    Patient continues to demonstrate the following deficits: muscle weakness, impaired timing and sequencing and decreased motor planning, decreased motor planning and decreased initiation, decreased attention, decreased awareness, decreased problem solving, decreased safety awareness, decreased memory and delayed processing and therefore will continue to benefit from skilled OT intervention to enhance overall performance with BADL, iADL and Reduce care partner burden.  Patient progressing toward long term goals..  Continue plan of care.  OT Short Term Goals Week 1:  OT Short Term Goal 1 (Week 1): Patient will complete toilet transfer with max A +2 OT Short Term Goal 1 - Progress (Week 1): Met OT Short Term Goal 2 (Week 1): Patient will complete 1 step of UB dressing with max verbal cues OT Short Term Goal 2 - Progress (Week 1): Met OT Short Term Goal 3 (Week 1): Patient will complete sit<> stand with max A +2 in prep for BADL tasks. OT Short Term Goal 3 - Progress (Week 1): Met Week 2:  OT Short Term Goal 1 (Week 2): Patient will complete toilet transfer min A. OT Short Term Goal 2 (Week 2): Patient will complete LB dressing mod A. OT Short Term Goal 3 (Week 2): Patient will complete grooming task with 2 or less vc's for initiation/termination. OT Short Term Goal 4 (Week 2): Patient will  complete 2/3 steps of toileting with min A for advancing pants past hips.  Skilled Therapeutic Interventions/Progress Updates:    Pt received sitting in recliner w/ boyfriend present, no pain reported and agreeable to OT. Pt ambulated to shower mod +2. Doffed shoes/socks CGA for balance. Doffing LB clothes mod A; doffing UB clothes max A. Pt's PICC line and sugar tong splint covered to enable shower with flexi seal/bag. Pt showered min A but required frequent redirection and vc's for initiation of washing other body parts and distracted by bag over splint. Pt perseverative on granddaughter's whereabouts and confabulations about nonsensical topics. Socks donned dependently. Pt ambulated mod A +2 to toilet; continent of bladder and toileting with mod-max A for assistance of brief over hips and cues for peri hygiene. Pt donned LB clothing max A and UB clothing mod A with vc's. Pt ambulated mod A +2 to w/c and left sitting in recliner with alarm set, call bell in reach, and all needs met.  Therapy Documentation Precautions:  Precautions Precautions: Fall Precaution Comments: RUE sling and sugar tong splint Required Braces or Orthoses: Sling Restrictions Weight Bearing Restrictions: Yes RUE Weight Bearing: Non weight bearing   Pain:  none reported.   Therapy/Group: Individual Therapy  Mellissa Kohut 08/10/2020, 3:47 PM

## 2020-08-10 NOTE — Progress Notes (Signed)
[facility] PHYSICAL MEDICINE & REHABILITATION PROGRESS NOTE  Subjective/Complaints: Patient seen laying in bed this AM.  No reported issues overnight.  She is confused, disoriented, and apprehensive this AM.   ROS: Denies CP, SOB, N/V/D  Objective: Vital Signs: Blood pressure 128/66, pulse 82, temperature 97.6 F (36.4 C), resp. rate 14, SpO2 99 %. No results found. Recent Labs    08/10/20 0516  WBC 4.5  HGB 10.4*  HCT 33.1*  PLT 338   No results for input(s): NA, K, CL, CO2, GLUCOSE, BUN, CREATININE, CALCIUM in the last 72 hours.  Intake/Output Summary (Last 24 hours) at 08/10/2020 1002 Last data filed at 08/10/2020 0900 Gross per 24 hour  Intake 460 ml  Output --  Net 460 ml        Physical Exam: BP 128/66 (BP Location: Left Arm)   Pulse 82   Temp 97.6 F (36.4 C)   Resp 14   SpO2 99%   Constitutional: No distress . Vital signs reviewed. HENT: Normocephalic.  Atraumatic. Eyes: EOMI. No discharge. Cardiovascular: No JVD.  RRR. Respiratory: Normal effort.  No stridor.  Bilateral clear to auscultation. GI: Non-distended.  BS +. Skin: Warm and dry.   Right upper extremity splint CDI Psych: Confused.  Apprehensive. Musc: Right wrist splint with some edema and tenderness, unchanged Mental Status: Alert Makes eye contact with examiner.  Limited insight and awareness.  Motor: LUE/LLE: 4-4+/5 proximal distal, appears stable RUE: Shoulder abduction 4/5, distally limited by splint, appears stable RLE: 4/5 proximal to distal, appears unchanged  Assessment/Plan: 1. Functional deficits which require 3+ hours per day of interdisciplinary therapy in a comprehensive inpatient rehab setting.  Physiatrist is providing close team supervision and 24 hour management of active medical problems listed below.  Physiatrist and rehab team continue to assess barriers to discharge/monitor patient progress toward functional and medical goals   Care Tool:  Bathing    Body parts  bathed by patient: Right arm,Left arm,Chest,Abdomen,Front perineal area,Right upper leg,Left upper leg,Right lower leg,Left lower leg,Face   Body parts bathed by helper: Buttocks,Right upper leg,Left upper leg,Right lower leg,Left lower leg     Bathing assist Assist Level: Minimal Assistance - Patient > 75%     Upper Body Dressing/Undressing Upper body dressing   What is the patient wearing?: Pull over shirt    Upper body assist Assist Level: Moderate Assistance - Patient 50 - 74%    Lower Body Dressing/Undressing Lower body dressing      What is the patient wearing?: Pants     Lower body assist Assist for lower body dressing: Maximal Assistance - Patient 25 - 49%     Toileting Toileting    Toileting assist Assist for toileting: Maximal Assistance - Patient 25 - 49%     Transfers Chair/bed transfer  Transfers assist     Chair/bed transfer assist level: Moderate Assistance - Patient 50 - 74%     Locomotion Ambulation   Ambulation assist   Ambulation activity did not occur: Safety/medical concerns  Assist level: Moderate Assistance - Patient 50 - 74% Assistive device: Cane-quad Max distance: 40'   Walk 10 feet activity   Assist  Walk 10 feet activity did not occur: Safety/medical concerns  Assist level: Moderate Assistance - Patient - 50 - 74% Assistive device: Cane-quad   Walk 50 feet activity   Assist Walk 50 feet with 2 turns activity did not occur: Safety/medical concerns         Walk 150 feet activity  Assist Walk 150 feet activity did not occur: Safety/medical concerns         Walk 10 feet on uneven surface  activity   Assist Walk 10 feet on uneven surfaces activity did not occur: Safety/medical concerns   Assist level: Moderate Assistance - Patient - 50 - 74% Assistive device: Youth worker Will patient use wheelchair at discharge?: No   Wheelchair activity did not occur: Safety/medical  concerns         Wheelchair 50 feet with 2 turns activity    Assist    Wheelchair 50 feet with 2 turns activity did not occur: Safety/medical concerns       Wheelchair 150 feet activity     Assist  Wheelchair 150 feet activity did not occur: Safety/medical concerns        Medical Problem List and Plan: 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 Completed 7-day course of Keppra for seizure prophylaxis. Follow-up Dr. Conchita Paris  Continue CIR  Seroquel started for agitation/anxiousness, dose decreased on 5/3 2. 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 No current plans for long-term anticoagulation due to risk of fall/SDH.   Follow-up vascular study next week -antiplatelet therapy: Lovenox 30 mg every 12 hours 3. Pain Management:Naprosyn 250 mg twice daily, Ultram as needed  Appears controlled on 5/6 4. Mood:Provide emotional support -antipsychotic agents: N/A 5. Neuropsych: This patientis notcapable of making decisions on herown behalf.  Telesitter for safety 6. Skin/Wound Care:Routine skin checks 7. Fluids/Electrolytes/Nutrition:Routine in and outs 8. Right maxillary sinus, right lateral orbital wall and floor fractures with fat herniation. Follow-up outpatient Dr Leta Baptist. No surgical intervention planned 9. 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 Follow-up as outpatient. 10. Hypertension. Norvasc 5 mg daily.  Controlled on 5/6  Monitor his increase mobility 11. Incidental finding of abnormal appearing endometrium identified on MRI lumbar spine. Follow-up outpatient OB/GYN 12. Right distal comminuted distal radius fracture and minimally displaced radial head fracture. No surgical intervention at this time follow-up outpatient Dr. Merlyn Lot.  -Nonweightbearing with splintRUE. -encourage pt to keep splint in place 13.  Acute blood loss  anemia  Hemoglobin 10.4 on 5/6, labs ordered for Monday  Continue to monitor  LOS: 8 days A FACE TO FACE EVALUATION WAS PERFORMED  Eloisa Chokshi Karis Juba 08/10/2020, 10:02 AM

## 2020-08-10 NOTE — Progress Notes (Signed)
Physical Therapy Weekly Progress Note  Patient Details  Name: Danielle Kirby MRN: [id number] Date of Birth: [birth year]  Beginning of progress report period: August 03, 2020 End of progress report period: Aug 10, 2020  Today's Date: 08/10/2020 PT Individual Time: 0830-0930 PT Individual Time Calculation (min): 60 min   Patient has met 4 of 4 short term goals.  Patient progressing well this week with ability to perform bed mobility at times with S, sit to stand with min to mod A, and ambulate with QC with min to mod A x up to 40'.  She has negotiated 8 (3") steps with +2 A for safety with mod A overall and is initiating movement easier with less repeated multimodal cueing.  Still only can sustain attention for less than 1 minute to a balance task and fatigues with standing activity in less than 2 minutes.   Patient continues to demonstrate the following deficits muscle weakness, decreased coordination and decreased motor planning and decreased initiation, decreased attention, decreased awareness, decreased problem solving, decreased safety awareness, decreased memory and delayed processing and therefore will continue to benefit from skilled PT intervention to increase functional independence with mobility.  Patient progressing toward long term goals..  Continue plan of care.  PT Short Term Goals Week 1:  PT Short Term Goal 1 (Week 1): Patient to demonstrate sustained attention to mobility tasks with mod cues 30% of the time. PT Short Term Goal 1 - Progress (Week 1): Met PT Short Term Goal 2 (Week 1): Patient to perform supine to sit with mod A with cues and increased time. PT Short Term Goal 2 - Progress (Week 1): Met PT Short Term Goal 3 (Week 1): Patient to perform bed/chair transfers with mod A within 5 minutes of initial cue. PT Short Term Goal 3 - Progress (Week 1): Met PT Short Term Goal 4 (Week 1): Patient to initate standing activities with +2 A with LRAD. PT Short Term Goal 4 - Progress  (Week 1): Met Week 2:  PT Short Term Goal 1 (Week 2): Patient will ambulate 100' with LRAD and min A. PT Short Term Goal 2 (Week 2): Patient will demonstrate standing for 3 minutes for functional task with min cues for sustained attention to task. PT Short Term Goal 3 (Week 2): Patient will negotiate 4 (6') steps with min A with rails. PT Short Term Goal 4 (Week 2): Patient will demonstrate functional transfers with CGA.  Skilled Therapeutic Interventions/Progress Updates:  Patient in supine and lightly sleeping, easily aroused.  Needed max repeated cues for orientation and for initiation of mobility up OOB.  After about 3 minutes pt supine to sit with S.  Seated to don shoes with max A and washed face and donned glasses with set up.  Patient stand pivot to w/c with min A.  Ate breakfast in chair after set up.  Patient continuously re-oriented to situation, place and time throughout while eating breakfast and RN in to deliver medications.  Patient in w/c assisted to therapy gym.  Performed sit to stand with mod A and ambulated with QC to mat x 30'.  Patient sit to stand x 5 with min A for LE strength and technique with mod cues for hand placement, safety.  Patient ambulated 54' with QC and min A with mod cues for sequencing and fluency to parallel bars.  In bars performed side stepping with min A and cues for technique (A for keeping R UE NWB).  Patient assisted to w/c  and transported to room.  Stand step to recliner with QC and min A.  Patient left seated with alarm belt active and all needs/call bell in reach. Ambulation/gait training;DME/adaptive equipment instruction;Neuromuscular re-education;Stair training;UE/LE Strength taining/ROM;Wheelchair propulsion/positioning;UE/LE Coordination activities;Therapeutic Activities;Discharge planning;Balance/vestibular training;Cognitive remediation/compensation;Disease management/prevention;Functional mobility training;Patient/family education;Therapeutic  Exercise;Splinting/orthotics;Visual/perceptual remediation/compensation   Therapy Documentation Precautions:  Precautions Precautions: Fall Precaution Comments: RUE sling and sugar tong splint Required Braces or Orthoses: Sling Restrictions Weight Bearing Restrictions: Yes RUE Weight Bearing: Non weight bearing Pain: Pain Assessment Faces Pain Scale: Hurts a little bit Pain Type: Acute pain Pain Location: Leg Pain Orientation: Left Pain Descriptors / Indicators: Aching Pain Onset: On-going Pain Intervention(s): Ambulation/increased activity   Therapy/Group: Individual Therapy  Reginia Naas  Magda Kiel, PT 08/10/2020, 8:49 AM

## 2020-08-10 NOTE — Progress Notes (Signed)
Physical Therapy Session Note  Patient Details  Name: Danielle Kirby MRN: [id number] Date of Birth: December 19, 1945  Today's Date: 08/10/2020 PT Individual Time: 1102-1156 PT Individual Time Calculation (min): 54 min   Short Term Goals: Week 2:  PT Short Term Goal 1 (Week 2): Patient will ambulate 100' with LRAD and min A. PT Short Term Goal 2 (Week 2): Patient will demonstrate standing for 3 minutes for functional task with min cues for sustained attention to task. PT Short Term Goal 3 (Week 2): Patient will negotiate 4 (6') steps with min A with rails. PT Short Term Goal 4 (Week 2): Patient will demonstrate functional transfers with CGA.  Skilled Therapeutic Interventions/Progress Updates:  Pt received sitting in recliner and agreeable to PT. Pt performed sit<>stand transfers x 6 throughout session and stand pivot transfers x 3 with min-mod assist from PT and cues for NWB RUE through transfer. Stand pivot transfer performed with LUE support on QC. Gait training with QC x 43f with min assist overall and min cues for improved AD management to reduce step length size and improve safety. PT instructed pt in sustained attention task of therapy ball drumming x 2 songs with LUE only. Pt required to follow instructions and with intermittent change in direction, speed and use of various UE segements. Pt able to follow commands ~30-45 sec at a time prior to needing repetition to instruction. Pt performed second song in standing x 2 min with min assist for balance prior to requesting to return to sitting. Patient returned to room and performed stand pivot to recliner with min A. Pt left sitting in recliner with call bell in reach and all needs met.      Therapy Documentation Precautions:  Precautions Precautions: Fall Precaution Comments: RUE sling and sugar tong splint Required Braces or Orthoses: Sling Restrictions Weight Bearing Restrictions: Yes RUE Weight Bearing: Non weight bearing     Pain: Pain Assessment Pain Scale: 0-10 Pain Score: 2 Faces Pain Scale: Hurts a little bit Pain Type: Acute pain Pain Location: Leg Pain Orientation: Left Pain Descriptors / Indicators: Aching Pain Onset: On-going Patients Stated Pain Goal: 0 Pain Intervention(s): Medication (See eMAR);Repositioned Multiple Pain Sites: No    Therapy/Group: Individual Therapy  ALorie Phenix5/09/2020, 11:57 AM

## 2020-08-10 NOTE — Progress Notes (Signed)
Speech Language Pathology Weekly Progress and Session Note  Patient Details  Name: Danielle Kirby MRN: [id number] Date of Birth: [birth year]  Beginning of progress report period: August 03, 2020 End of progress report period: Aug 10, 2020  Today's Date: 08/10/2020 SLP Individual Time: 7517-0017 SLP Individual Time Calculation (min): 40 min  Short Term Goals: Week 1: SLP Short Term Goal 1 (Week 1): Patient will initiate functional tasks in 50% of opportunities with Max A verbal cues. SLP Short Term Goal 1 - Progress (Week 1): Met SLP Short Term Goal 2 (Week 1): Patient will demonstrate sustained attention to functional tasks for 2 minutes with Max verbal cues for redirection. SLP Short Term Goal 2 - Progress (Week 1): Not met SLP Short Term Goal 3 (Week 1): Patient will keep her eyes open for ~10 minute intervals with Max A multimodal cues. SLP Short Term Goal 3 - Progress (Week 1): Not met SLP Short Term Goal 4 (Week 1): Patient will demonstrate orientation to place, time and orientation with Max A multimodal cues. SLP Short Term Goal 4 - Progress (Week 1): Met SLP Short Term Goal 5 (Week 1): Patient will maintain topic of conversation for ~3 turns with Max A verbal cues. SLP Short Term Goal 5 - Progress (Week 1): Met    New Short Term Goals: Week 2: SLP Short Term Goal 1 (Week 2): Patient will initiate functional tasks in 75% of opportunities with Mod A verbal cues. SLP Short Term Goal 2 (Week 2): Patient will demonstrate sustained attention to functional tasks for 2 minutes with Max verbal cues for redirection. SLP Short Term Goal 3 (Week 2): Patient will keep her eyes open for ~10 minute intervals with Max A multimodal cues. SLP Short Term Goal 4 (Week 2): Patient will demonstrate orientation to place, time and orientation with Mod A multimodal cues. SLP Short Term Goal 5 (Week 2): Patient will maintain topic of conversation for ~3 turns with Mod A verbal cues.  Weekly Progress Updates:   Patient has made functional gains and has met 3 of 5 STGs this reporting period. 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 Patient and family education ongoing. Patient would benefit from continued skilled SLP intervention to maximize her cognitive functioning and overall functional independence.      Intensity: Minumum of 1-2 x/day, 30 to 90 minutes Frequency: 3 to 5 out of 7 days Duration/Length of Stay: 08/30/20 Treatment/Interventions: Cognitive remediation/compensation;Internal/external aids;Therapeutic Activities;Environmental controls;Cueing hierarchy;Functional tasks;Patient/family education   Daily Session  Skilled Therapeutic Interventions: Skilled treatment session focused on cognitive goals. SLP facilitated session by administering the [place] Status Examination (SLUMS). Patient scored 9/30 points with a score of 27 or above considered normal. Patient demonstrates severe deficits in attention, memory and problem solving. Patient with minimal intellectual awareness of deficits and was unable to identify which limb was injured despite having a splint. Patient reported she loves to read and attempted to read a small paragraph in large print. Patient reported the words were "running together" and could only read about 50% of the paragraph correctly with minimal awareness of errors. Patient answered reading comprehension questions with 50% accuracy. Patient left upright in recliner with alarm on and all needs within reach. Continue with current plan of care.      Pain No/Denies Pain   Therapy/Group: Individual Therapy  Ameria Sanjurjo 08/10/2020, 6:38 AM

## 2020-08-11 NOTE — Progress Notes (Signed)
Pt is climbing out of bed oriented to self. Tele sitter in room. Pt refused all medications tonight. 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

## 2020-08-11 NOTE — Progress Notes (Signed)
[place] PHYSICAL MEDICINE & REHABILITATION PROGRESS NOTE  Subjective/Complaints:  Pt tried to get OOB this AM- set off bed alarm- kept going- was determined to get OOB.  Denied pain Doesn't know LBM but "assume it's lately- goes regularly". LBM this AM right before I saw pt.  Denies any issues.   refused meds last night- "we are trying to push pills on her".   ROS: limited due to cognition/behavior  Objective: Vital Signs: Blood pressure 139/82, pulse 84, temperature 98.7 F (37.1 C), resp. rate 16, SpO2 100 %. No results found. Recent Labs    08/10/20 0516  WBC 4.5  HGB 10.4*  HCT 33.1*  PLT 338   No results for input(s): NA, K, CL, CO2, GLUCOSE, BUN, CREATININE, CALCIUM in the last 72 hours.  Intake/Output Summary (Last 24 hours) at 08/11/2020 1500 Last data filed at 08/11/2020 0916 Gross per 24 hour  Intake 470 ml  Output --  Net 470 ml        Physical Exam: BP 139/82 (BP Location: Left Arm)   Pulse 84   Temp 98.7 F (37.1 C)   Resp 16   SpO2 100%     General: awake, alert, trying to climb out of bed; NAD HENT: conjugate gaze; oropharynx moist CV: regular rate; no JVD Pulmonary: CTA B/L; no W/R/R- good air movement GI: soft, NT, ND, (+)BS Psychiatric: confused and irritable won't let her OOB Neurological: alert, but confused; also anxious Skin: Warm and dry.   Right upper extremity splint CDI Psych: Confused.  Apprehensive. Musc: Right wrist splint with some edema and tenderness, unchanged Mental Status: Alert Makes eye contact with examiner.  Limited insight and awareness.  Motor: LUE/LLE: 4-4+/5 proximal distal, appears stable RUE: Shoulder abduction 4/5, distally limited by splint, appears stable RLE: 4/5 proximal to distal, appears unchanged  Assessment/Plan: 1. Functional deficits which require 3+ hours per day of interdisciplinary therapy in a comprehensive inpatient rehab setting.  Physiatrist is providing close team supervision and 24  hour management of active medical problems listed below.  Physiatrist and rehab team continue to assess barriers to discharge/monitor patient progress toward functional and medical goals   Care Tool:  Bathing    Body parts bathed by patient: Right arm,Chest,Abdomen,Front perineal area,Buttocks,Right upper leg,Left upper leg,Right lower leg,Left lower leg,Face   Body parts bathed by helper: Buttocks,Right upper leg,Left upper leg,Right lower leg,Left lower leg     Bathing assist Assist Level: Minimal Assistance - Patient > 75%     Upper Body Dressing/Undressing Upper body dressing   What is the patient wearing?: Pull over shirt    Upper body assist Assist Level: Maximal Assistance - Patient 25 - 49%    Lower Body Dressing/Undressing Lower body dressing      What is the patient wearing?: Pants     Lower body assist Assist for lower body dressing: Maximal Assistance - Patient 25 - 49%     Toileting Toileting    Toileting assist Assist for toileting: Maximal Assistance - Patient 25 - 49%     Transfers Chair/bed transfer  Transfers assist     Chair/bed transfer assist level: Minimal Assistance - Patient > 75%     Locomotion Ambulation   Ambulation assist   Ambulation activity did not occur: Safety/medical concerns  Assist level: Moderate Assistance - Patient 50 - 74% Assistive device: Cane-quad Max distance: 30'   Walk 10 feet activity   Assist  Walk 10 feet activity did not occur: Safety/medical concerns  Assist  level: Moderate Assistance - Patient - 50 - 74% Assistive device: Cane-quad   Walk 50 feet activity   Assist Walk 50 feet with 2 turns activity did not occur: Safety/medical concerns         Walk 150 feet activity   Assist Walk 150 feet activity did not occur: Safety/medical concerns         Walk 10 feet on uneven surface  activity   Assist Walk 10 feet on uneven surfaces activity did not occur: Safety/medical  concerns   Assist level: Moderate Assistance - Patient - 50 - 74% Assistive device: Youth worker Will patient use wheelchair at discharge?: No   Wheelchair activity did not occur: Safety/medical concerns         Wheelchair 50 feet with 2 turns activity    Assist    Wheelchair 50 feet with 2 turns activity did not occur: Safety/medical concerns       Wheelchair 150 feet activity     Assist  Wheelchair 150 feet activity did not occur: Safety/medical concerns        Medical Problem List and Plan: 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 Completed 7-day course of Keppra for seizure prophylaxis. Follow-up Dr. Conchita Paris  Continue CIR  Seroquel started for agitation/anxiousness, dose decreased on 5/3  5/7 con't CIR PT/OT/SLP and telesitter for safety 2. 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 No current plans for long-term anticoagulation due to risk of fall/SDH.   Follow-up vascular study next week -antiplatelet therapy: Lovenox 30 mg every 12 hours 3. Pain Management:Naprosyn 250 mg twice daily, Ultram as needed  Appears controlled on 5/6  5/7- denies pain- con't regimen 4. Mood:Provide emotional support -antipsychotic agents: N/A 5. Neuropsych: This patientis notcapable of making decisions on herown behalf.  Telesitter for safety  5/7- need to continue- pt tried to climb out of bed and then tried again while I was in room.  6. Skin/Wound Care:Routine skin checks 7. Fluids/Electrolytes/Nutrition:Routine in and outs 8. Right maxillary sinus, right lateral orbital wall and floor fractures with fat herniation. Follow-up outpatient Dr Leta Baptist. No surgical intervention planned 9. 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 Follow-up as outpatient. 10. Hypertension. Norvasc 5 mg daily.  Controlled on 5/6  5/7- BP borderline high- will  con't regimen  Monitor his increase mobility 11. Incidental finding of abnormal appearing endometrium identified on MRI lumbar spine. Follow-up outpatient OB/GYN 12. Right distal comminuted distal radius fracture and minimally displaced radial head fracture. No surgical intervention at this time follow-up outpatient Dr. Merlyn Lot.  -Nonweightbearing with splintRUE. -encourage pt to keep splint in place 13.  Acute blood loss anemia  Hemoglobin 10.4 on 5/6, labs ordered for Monday  Continue to monitor  LOS: 9 days A FACE TO FACE EVALUATION WAS PERFORMED  Danielle Kirby 08/11/2020, 3:00 PM

## 2020-08-11 NOTE — Progress Notes (Signed)
Occupational Therapy TBI Note  Patient Details  Name: Danielle Kirby MRN: [id number] Date of Birth: 1945-12-01  Today's Date: 08/11/2020 OT Individual Time: 1007-1100 OT Individual Time Calculation (min): 53 min    Short Term Goals: Week 2:  OT Short Term Goal 1 (Week 2): Patient will complete toilet transfer min A. OT Short Term Goal 2 (Week 2): Patient will complete LB dressing mod A. OT Short Term Goal 3 (Week 2): Patient will complete grooming task with 2 or less vc's for initiation/termination. OT Short Term Goal 4 (Week 2): Patient will complete 2/3 steps of toileting with min A for advancing pants past hips.  Skilled Therapeutic Interventions/Progress Updates:     Pt received seated in recliner, denies pain throughout session, agreeable to therapy. Sugar tong splint donned with total A. Stand-pivot to w/c no AD with light min A for balance.  Session focus on functional cognition, safety awareness, attention to task, and problem solving in prep for improved ADL performance and safety. Seated at [place], participated in 2 round of memory game. Pt occasionally recalling correct sequences of 2 to 3 items, but overall req constant VCs for game instructions and recalling items. Additionally requiring total A to accurately complete Trail Making Part A. Noted to be internally distracted/perseverated conversation re her family, cleaning wipes, etc. In kitchen, was able to identify 5 safety/fall hazards with min VCs (unplugged cords, open cabinets, etc.). Finally, attempted level 1 difficulty visuoconstructive task with recreating peg design - req total A for correct initiation, but when the design was started, pt was able to accurately complete. Pt electing to stay in w/c at end of session.   Pt left in w/c with safety belt alarm engaged, call bell in reach, and all immediate needs met.    Therapy Documentation Precautions:  Precautions Precautions: Fall Precaution Comments: RUE sling and sugar  tong splint Required Braces or Orthoses: Sling Restrictions Weight Bearing Restrictions: Yes RUE Weight Bearing: Non weight bearing Pain:   denies Agitated Behavior Scale: TBI Observation Details Observation Environment: pt room Start of observation period - Date: 08/11/20 Start of observation period - Time: 1007 End of observation period - Date: 08/11/20 End of observation period - Time: 1100 Agitated Behavior Scale (DO NOT LEAVE BLANKS) Short attention span, easy distractibility, inability to concentrate: Present to a moderate degree Impulsive, impatient, low tolerance for pain or frustration: Absent Uncooperative, resistant to care, demanding: Absent Violent and/or threatening violence toward people or property: Absent Explosive and/or unpredictable anger: Absent Rocking, rubbing, moaning, or other self-stimulating behavior: Absent Pulling at tubes, restraints, etc.: Absent Wandering from treatment areas: Absent Restlessness, pacing, excessive movement: Absent Repetitive behaviors, motor, and/or verbal: Present to a slight degree Rapid, loud, or excessive talking: Absent Sudden changes of mood: Absent Easily initiated or excessive crying and/or laughter: Absent Self-abusiveness, physical and/or verbal: Absent Agitated behavior scale total score: 17  ADL: See Care Tool for more details.  Therapy/Group: Individual Therapy  Volanda Napoleon MS, OTR/L  08/11/2020, 6:50 AM

## 2020-08-11 NOTE — Progress Notes (Signed)
Speech Language Pathology Daily Session Note  Patient Details  Name: Danielle Kirby MRN: [id number] Date of Birth: October 19, 1945  Today's Date: 08/11/2020 SLP Individual Time: 1300-1345 SLP Individual Time Calculation (min): 45 min  Short Term Goals: Week 2: SLP Short Term Goal 1 (Week 2): Patient will initiate functional tasks in 75% of opportunities with Mod A verbal cues. SLP Short Term Goal 2 (Week 2): Patient will demonstrate sustained attention to functional tasks for 2 minutes with Max verbal cues for redirection. SLP Short Term Goal 3 (Week 2): Patient will keep her eyes open for ~10 minute intervals with Max A multimodal cues. SLP Short Term Goal 4 (Week 2): Patient will demonstrate orientation to place, time and orientation with Mod A multimodal cues. SLP Short Term Goal 5 (Week 2): Patient will maintain topic of conversation for ~3 turns with Mod A verbal cues.  Skilled Therapeutic Interventions:   Patient seen for skilled ST session with son present in room during last 10 minutes. Patient was very verbose, tangential, perseverative, very suspicious without any awareness. She was fully alert during entire session, sitting in her wheelchair with waistbelt restraint in place. Patient was not able to initiate tasks as she would question everything about potential task, perseverating on variety of topics. SLP attempted to simplify patient's reason for admission "you fell at home, got a brain bleed/brain injury and came to hospital". Even with son giving patient the same exact information, she continued to deny that anyone knows. Her attention and short term memory are significantly impaired, as she would ask SLP if food on her lunch tray was his, and would say she knew nothing of any outfits when her son was sitting next to her on her bed folding her new clothes. Patient continues to benefit from skilled SLP intervention to maximize cognitive function prior to discharge.   Pain Pain  Assessment Pain Scale: Faces Faces Pain Scale: No hurt  Therapy/Group: Individual Therapy  Angela Nevin, MA, CCC-SLP Speech Therapy

## 2020-08-12 NOTE — Progress Notes (Signed)
IVT consulted for dressing change. Upon arrival, pt in wheelchair; RN will move pt to bed.  IVT will come back.

## 2020-08-12 NOTE — Progress Notes (Signed)
Speech Language Pathology TBI Note  Patient Details  Name: Danielle Kirby MRN: [id number] Date of Birth: October 20, 1945  Today's Date: 08/12/2020 SLP Individual Time: 1350-1430 SLP Individual Time Calculation (min): 40 min  Short Term Goals: Week 2: SLP Short Term Goal 1 (Week 2): Patient will initiate functional tasks in 75% of opportunities with Mod A verbal cues. SLP Short Term Goal 2 (Week 2): Patient will demonstrate sustained attention to functional tasks for 2 minutes with Max verbal cues for redirection. SLP Short Term Goal 3 (Week 2): Patient will keep her eyes open for ~10 minute intervals with Max A multimodal cues. SLP Short Term Goal 4 (Week 2): Patient will demonstrate orientation to place, time and orientation with Mod A multimodal cues. SLP Short Term Goal 5 (Week 2): Patient will maintain topic of conversation for ~3 turns with Mod A verbal cues.  Skilled Therapeutic Interventions:   Pt was seen for skilled ST targeting cognitive goals.  Pt was resting upon arrival but awakened easily to voice.  Pt remained slightly suspicious of therapist but was more easily redirected than reports of previous therapy sessions.  Pt was disoriented to place, date, and situation; therefore, signs were posted around pt's room to maximize carryover of orientation information in between therapy sessions.  When therapist demonstrated use of signage, pt stated "It's a waste of your time," but therapist gently encouraged pt to use them to help with disorientation.  When verbally reoriented, pt was more receptive to information than when using signage (ie, when told reason for hospitalization, pt stated "That's I right, I think I remember someone saying that, etc).  Pt was able to maintain a topic of conversation for 3 turns with mod a verbal cues during informal conversations with therapist. Pt was left in recliner with chair alarm set and call bell within reach.  Continue per current plan of care.     Pain Pain Assessment Pain Scale: 0-10 Pain Score: 0-No pain  Agitated Behavior Scale: TBI Observation Details Observation Environment: room Start of observation period - Date: 08/12/20 Start of observation period - Time: 1350 End of observation period - Date: 08/12/20 End of observation period - Time: 1430 Agitated Behavior Scale (DO NOT LEAVE BLANKS) Short attention span, easy distractibility, inability to concentrate: Present to a moderate degree Impulsive, impatient, low tolerance for pain or frustration: Absent Uncooperative, resistant to care, demanding: Present to a slight degree Violent and/or threatening violence toward people or property: Absent Explosive and/or unpredictable anger: Absent Rocking, rubbing, moaning, or other self-stimulating behavior: Absent Pulling at tubes, restraints, etc.: Absent Wandering from treatment areas: Absent Restlessness, pacing, excessive movement: Absent Repetitive behaviors, motor, and/or verbal: Absent Rapid, loud, or excessive talking: Absent Sudden changes of mood: Absent Easily initiated or excessive crying and/or laughter: Absent Self-abusiveness, physical and/or verbal: Absent Agitated behavior scale total score: 17  Therapy/Group: Individual Therapy  Janeice Stegall, Melanee Spry 08/12/2020, 3:44 PM

## 2020-08-12 NOTE — Progress Notes (Signed)
   08/12/20 1817  What Happened  Was fall witnessed? No  Was patient injured? No  Patient found on floor  Found by Staff-comment  Stated prior activity to/from bed, chair, or stretcher  Follow Up  MD notified Dr. Berline Chough  Time MD notified [phone]  Family notified Yes - comment  Time family notified 1828  Additional tests No  Progress note created (see row info) Yes  Adult Fall Risk Assessment  Risk Factor Category (scoring not indicated) High fall risk per protocol (document High fall risk)  Patient Fall Risk Level High fall risk  Adult Fall Risk Interventions  Required Bundle Interventions *See Row Information* High fall risk - low, moderate, and high requirements implemented  Additional Interventions Camera surveillance (with patient/family notification & education);Lap belt while in chair/wheelchair (Rehab only);Use of appropriate toileting equipment (bedpan, BSC, etc.)  Screening for Fall Injury Risk (To be completed on HIGH fall risk patients) - Assessing Need for Floor Mats  Risk For Fall Injury- Criteria for Floor Mats TeleSitter camera in use  Will Implement Floor Mats Yes  Vitals  Temp 98.2 F (36.8 C)  Temp Source Oral  BP 117/88  MAP (mmHg) 98  BP Location Right Arm  BP Method Automatic  Patient Position (if appropriate) Sitting  Pulse Rate (!) 104  Pulse Rate Source Monitor  Resp 19  Oxygen Therapy  SpO2 100 %  O2 Device Room Air  Pain Assessment  Pain Scale 0-10  Pain Score 0  PCA/Epidural/Spinal Assessment  Respiratory Pattern Regular  Neurological  Neuro (WDL) X  Level of Consciousness Alert  Orientation Level Oriented to person  Cognition Poor attention/concentration;Poor safety awareness  Speech Clear  Motor Function/Sensation Assessment Grip  R Hand Grip Moderate  L Hand Grip Moderate   R Foot Dorsiflexion Moderate  L Foot Dorsiflexion Moderate  R Foot Plantar Flexion Moderate  L Foot Plantar Flexion Moderate  RUE Motor Response Purposeful  movement  RUE Sensation Full sensation  RUE Motor Strength 4  LUE Motor Response Purposeful movement  LUE Sensation Full sensation  LUE Motor Strength 4  RLE Motor Response Purposeful movement  RLE Sensation Full sensation  RLE Motor Strength 4  LLE Motor Response Purposeful movement  LLE Sensation Full sensation  LLE Motor Strength 4  Neuro Symptoms Forgetful  Neuro symptoms relieved by Other (Comment) (Redirect)  Musculoskeletal  Musculoskeletal (WDL) X  Assistive Device Wheelchair  Generalized Weakness Yes  Weight Bearing Restrictions Yes  RUE Weight Bearing NWB  Integumentary  Integumentary (WDL) X  Skin Color Appropriate for ethnicity  Skin Condition Dry  Skin Integrity Intact

## 2020-08-12 NOTE — Progress Notes (Addendum)
[place] PHYSICAL MEDICINE & REHABILITATION PROGRESS NOTE  Subjective/Complaints:  Pt reports "not trying to mess with PICC intentionally" but admits it's being pulled on.   C/O being cold- will turn temp up in room to 73 degrees.   Also, took off Splint for RUE sometime- it's in bedding- OT will put back on pt.   ROS: limited by cognition/behavior  Objective: Vital Signs: Blood pressure (!) 158/88, pulse 87, temperature 98.8 F (37.1 C), temperature source Oral, resp. rate 18, SpO2 100 %. No results found. Recent Labs    08/10/20 0516  WBC 4.5  HGB 10.4*  HCT 33.1*  PLT 338   No results for input(s): NA, K, CL, CO2, GLUCOSE, BUN, CREATININE, CALCIUM in the last 72 hours.  Intake/Output Summary (Last 24 hours) at 08/12/2020 1024 Last data filed at 08/11/2020 1842 Gross per 24 hour  Intake 360 ml  Output --  Net 360 ml        Physical Exam: BP (!) 158/88 (BP Location: Right Arm)   Pulse 87   Temp 98.8 F (37.1 C) (Oral)   Resp 18   SpO2 100%      General: awake, alert, irritable about being cold- NT and OT in room; NAD HENT: conjugate gaze; oropharynx moist CV: regular rate; no JVD Pulmonary: CTA B/L; no W/R/R- good air movement GI: soft, NT, ND, (+)BS Psychiatric: anxious Neurological: anxious, confused; frustrated about having to put splint back on R arm Skin: Warm and dry.  PICC dressing about 1/2 off- calling IV team to put back on.  Right upper extremity splint off and in bedding- will be put back on by OT Musc: Right wrist splint with some edema and tenderness, unchanged Makes eye contact with examiner.  Limited insight and awareness.  Motor: LUE/LLE: 4-4+/5 proximal distal, appears stable RUE: Shoulder abduction 4/5, distally limited by splint, appears stable RLE: 4/5 proximal to distal, appears unchanged  Assessment/Plan: 1. Functional deficits which require 3+ hours per day of interdisciplinary therapy in a comprehensive inpatient rehab  setting.  Physiatrist is providing close team supervision and 24 hour management of active medical problems listed below.  Physiatrist and rehab team continue to assess barriers to discharge/monitor patient progress toward functional and medical goals   Care Tool:  Bathing    Body parts bathed by patient: Right arm,Chest,Abdomen,Front perineal area,Buttocks,Right upper leg,Left upper leg,Right lower leg,Left lower leg,Face   Body parts bathed by helper: Buttocks,Right upper leg,Left upper leg,Right lower leg,Left lower leg     Bathing assist Assist Level: Minimal Assistance - Patient > 75%     Upper Body Dressing/Undressing Upper body dressing   What is the patient wearing?: Pull over shirt    Upper body assist Assist Level: Maximal Assistance - Patient 25 - 49%    Lower Body Dressing/Undressing Lower body dressing      What is the patient wearing?: Pants     Lower body assist Assist for lower body dressing: Maximal Assistance - Patient 25 - 49%     Toileting Toileting    Toileting assist Assist for toileting: Maximal Assistance - Patient 25 - 49%     Transfers Chair/bed transfer  Transfers assist     Chair/bed transfer assist level: Minimal Assistance - Patient > 75%     Locomotion Ambulation   Ambulation assist   Ambulation activity did not occur: Safety/medical concerns  Assist level: Moderate Assistance - Patient 50 - 74% Assistive device: Cane-quad Max distance: 30'   Walk 10 feet  activity   Assist  Walk 10 feet activity did not occur: Safety/medical concerns  Assist level: Moderate Assistance - Patient - 50 - 74% Assistive device: Cane-quad   Walk 50 feet activity   Assist Walk 50 feet with 2 turns activity did not occur: Safety/medical concerns         Walk 150 feet activity   Assist Walk 150 feet activity did not occur: Safety/medical concerns         Walk 10 feet on uneven surface  activity   Assist Walk 10 feet on  uneven surfaces activity did not occur: Safety/medical concerns   Assist level: Moderate Assistance - Patient - 50 - 74% Assistive device: Youth worker Will patient use wheelchair at discharge?: No   Wheelchair activity did not occur: Safety/medical concerns         Wheelchair 50 feet with 2 turns activity    Assist    Wheelchair 50 feet with 2 turns activity did not occur: Safety/medical concerns       Wheelchair 150 feet activity     Assist  Wheelchair 150 feet activity did not occur: Safety/medical concerns        Medical Problem List and Plan: 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 Completed 7-day course of Keppra for seizure prophylaxis. Follow-up Dr. Conchita Paris  Continue CIR  Seroquel started for agitation/anxiousness, dose decreased on 5/3  5/8- con't PT, OT and SLP and telesitter for safety 2. 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 No current plans for long-term anticoagulation due to risk of fall/SDH.   Follow-up vascular study next week -antiplatelet therapy: Lovenox 30 mg every 12 hours 3. Pain Management:Naprosyn 250 mg twice daily, Ultram as needed  Appears controlled on 5/6  5/7- denies pain- con't regimen 4. Mood:Provide emotional support -antipsychotic agents: N/A  5/8- on Seroquel 12.5 mg BID prn for agitation- might need to give scheduled at night due to pulling splint off, etc at night.  5. Neuropsych: This patientis notcapable of making decisions on herown behalf.  Telesitter for safety  5/7- need to continue- pt tried to climb out of bed and then tried again while I was in room.   5/8- pt pulled PICC dressing off and splint off completely- still needs telesitter- does she still need PICC??? 6. Skin/Wound Care:Routine skin checks  5/8- pt tried to pull PICC dressing off- will replace dressing 7.  Fluids/Electrolytes/Nutrition:Routine in and outs 8. Right maxillary sinus, right lateral orbital wall and floor fractures with fat herniation. Follow-up outpatient Dr Leta Baptist. No surgical intervention planned 9. 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 Follow-up as outpatient. 10. Hypertension. Norvasc 5 mg daily.  Controlled on 5/6  5/7- BP borderline high- will con't regimen  5/8- BP 155/88- however is upset about being "cold"- will monitor and have primary team change if still elevated.   Monitor his increase mobility 11. Incidental finding of abnormal appearing endometrium identified on MRI lumbar spine. Follow-up outpatient OB/GYN 12. Right distal comminuted distal radius fracture and minimally displaced radial head fracture. No surgical intervention at this time follow-up outpatient Dr. Merlyn Lot.  -Nonweightbearing with splintRUE. -encourage pt to keep splint in place  5/8- pt took off- we will put it back on- asked pt to leave on.  13.  Acute blood loss anemia  Hemoglobin 10.4 on 5/6, labs ordered for Monday  Continue to monitor  LOS: 10 days A FACE TO FACE EVALUATION WAS PERFORMED  Danielle Kirby 08/12/2020, 10:24 AM

## 2020-08-12 NOTE — Progress Notes (Signed)
Occupational Therapy Session Note  Patient Details  Name: Danielle Kirby MRN: [id number] Date of Birth: 10-29-1945  Today's Date: 08/12/2020 OT Individual Time: 4098-1191 OT Individual Time Calculation (min): 55 min    Short Term Goals: Week 2:  OT Short Term Goal 1 (Week 2): Patient will complete toilet transfer min A. OT Short Term Goal 2 (Week 2): Patient will complete LB dressing mod A. OT Short Term Goal 3 (Week 2): Patient will complete grooming task with 2 or less vc's for initiation/termination. OT Short Term Goal 4 (Week 2): Patient will complete 2/3 steps of toileting with min A for advancing pants past hips.  Skilled Therapeutic Interventions/Progress Updates:    Patient seated in recliner, nurse tech and MD present.  Note elbow splint off and under covers at end of bed, MD aware.  Patient with no s/s of pain or discomfort.  Reapplied splint without difficulty.  Sit to stand and SPT to/from recliner, w/c and bed surfaces with min A.  Tactile cues for NWB right UE.  Grooming tasks/oral care seated at sink with set up and min cues to initiate.  To unsupported sitting edge of bed to eat breakfast with set up and CS.  She was able to feed herself using left hand.  Unsupported sitting tolerated for 20+ minutes without LOB or difficulty.  She answered/spoke appropriately on phone with family member but handed me the phone without ending the conversation.  To supine position with CS - IV team arrived to change PICC dressing, bed alarm set and RN present at close of session.    Therapy Documentation Precautions:  Precautions Precautions: Fall Precaution Comments: RUE sling and sugar tong splint Required Braces or Orthoses: Sling Restrictions Weight Bearing Restrictions: Yes RUE Weight Bearing: Non weight bearing   Therapy/Group: Individual Therapy  [name] 08/12/2020, 7:37 AM

## 2020-08-13 LAB — CBC WITH DIFFERENTIAL/PLATELET
Abs Immature Granulocytes: 0.01 10*3/uL (ref 0.00–0.07)
Basophils Absolute: 0 10*3/uL (ref 0.0–0.1)
Basophils Relative: 1 %
Eosinophils Absolute: 0.2 10*3/uL (ref 0.0–0.5)
Eosinophils Relative: 5 %
HCT: 33.6 % — ABNORMAL LOW (ref 36.0–46.0)
Hemoglobin: 10.2 g/dL — ABNORMAL LOW (ref 12.0–15.0)
Immature Granulocytes: 0 %
Lymphocytes Relative: 49 %
Lymphs Abs: 1.8 10*3/uL (ref 0.7–4.0)
MCH: 26.9 pg (ref 26.0–34.0)
MCHC: 30.4 g/dL (ref 30.0–36.0)
MCV: 88.7 fL (ref 80.0–100.0)
Monocytes Absolute: 0.3 10*3/uL (ref 0.1–1.0)
Monocytes Relative: 8 %
Neutro Abs: 1.4 10*3/uL — ABNORMAL LOW (ref 1.7–7.7)
Neutrophils Relative %: 37 %
Platelets: 329 10*3/uL (ref 150–400)
RBC: 3.79 MIL/uL — ABNORMAL LOW (ref 3.87–5.11)
RDW: 14.7 % (ref 11.5–15.5)
WBC: 3.7 10*3/uL — ABNORMAL LOW (ref 4.0–10.5)
nRBC: 0 % (ref 0.0–0.2)
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
## 2020-08-13 NOTE — Progress Notes (Signed)
[place] PHYSICAL MEDICINE & REHABILITATION PROGRESS NOTE  Subjective/Complaints: Patient seen seen sitting up in bed this AM.  No reported issues overnight. Yesterday evening, patient with fall, no obvious trauma noted.   ROS: limited by cognition  Objective: Vital Signs: Blood pressure 116/79, pulse 83, temperature 98.1 F (36.7 C), resp. rate 16, SpO2 99 %. No results found. Recent Labs    08/13/20 0532  WBC 3.7*  HGB 10.2*  HCT 33.6*  PLT 329   No results for input(s): NA, K, CL, CO2, GLUCOSE, BUN, CREATININE, CALCIUM in the last 72 hours.  Intake/Output Summary (Last 24 hours) at 08/13/2020 1251 Last data filed at 08/13/2020 1246 Gross per 24 hour  Intake 773 ml  Output --  Net 773 ml        Physical Exam: BP 116/79 (BP Location: Right Arm)   Pulse 83   Temp 98.1 F (36.7 C)   Resp 16   SpO2 99%   Constitutional: No distress . Vital signs reviewed. HENT: Normocephalic.  Atraumatic. Eyes: EOMI. No discharge. Cardiovascular: No JVD.  RRR. Respiratory: Normal effort.  No stridor.  Bilateral clear to auscultation. GI: Non-distended.  BS +. Skin: Warm and dry.  RUE splint CDI.  Psych: Anxious. Distracted.  Musc: RUE with edema and some tenderness. No tenderness in extremities. Neuro: Alert and oriented x1 Makes eye contact with examiner, stable.  Limited insight and awareness.  Motor: LUE/LLE: 4-4+/5 proximal distal, appears stable RUE: Shoulder abduction 4/5, distally limited by splint, appears stable RLE: 4/5 proximal to distal, appears stable  Assessment/Plan: 1. Functional deficits which require 3+ hours per day of interdisciplinary therapy in a comprehensive inpatient rehab setting.  Physiatrist is providing close team supervision and 24 hour management of active medical problems listed below.  Physiatrist and rehab team continue to assess barriers to discharge/monitor patient progress toward functional and medical goals   Care Tool:  Bathing     Body parts bathed by patient: Right arm,Chest,Abdomen,Front perineal area,Buttocks,Right upper leg,Left upper leg,Right lower leg,Left lower leg,Face   Body parts bathed by helper: Buttocks,Right upper leg,Left upper leg,Right lower leg,Left lower leg     Bathing assist Assist Level: Minimal Assistance - Patient > 75%     Upper Body Dressing/Undressing Upper body dressing   What is the patient wearing?: Pull over shirt    Upper body assist Assist Level: Maximal Assistance - Patient 25 - 49%    Lower Body Dressing/Undressing Lower body dressing      What is the patient wearing?: Pants     Lower body assist Assist for lower body dressing: Maximal Assistance - Patient 25 - 49%     Toileting Toileting    Toileting assist Assist for toileting: Maximal Assistance - Patient 25 - 49%     Transfers Chair/bed transfer  Transfers assist     Chair/bed transfer assist level: Minimal Assistance - Patient > 75%     Locomotion Ambulation   Ambulation assist   Ambulation activity did not occur: Safety/medical concerns  Assist level: Moderate Assistance - Patient 50 - 74% Assistive device: Cane-quad Max distance: 30'   Walk 10 feet activity   Assist  Walk 10 feet activity did not occur: Safety/medical concerns  Assist level: Moderate Assistance - Patient - 50 - 74% Assistive device: Cane-quad   Walk 50 feet activity   Assist Walk 50 feet with 2 turns activity did not occur: Safety/medical concerns         Walk 150 feet activity  Assist Walk 150 feet activity did not occur: Safety/medical concerns         Walk 10 feet on uneven surface  activity   Assist Walk 10 feet on uneven surfaces activity did not occur: Safety/medical concerns   Assist level: Moderate Assistance - Patient - 50 - 74% Assistive device: Youth worker Will patient use wheelchair at discharge?: No   Wheelchair activity did not occur: Safety/medical  concerns         Wheelchair 50 feet with 2 turns activity    Assist    Wheelchair 50 feet with 2 turns activity did not occur: Safety/medical concerns       Wheelchair 150 feet activity     Assist  Wheelchair 150 feet activity did not occur: Safety/medical concerns        Medical Problem List and Plan: 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 Completed 7-day course of Keppra for seizure prophylaxis. Follow-up Dr. Conchita Paris  Continue CIR  Seroquel started for agitation/anxiousness, dose decreased on 5/3 2. 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 No current plans for long-term anticoagulation due to risk of fall/SDH.   Follow-up vascular study this week -antiplatelet therapy: Lovenox 30 mg every 12 hours 3. Pain Management:Naprosyn 250 mg twice daily, Ultram as needed  Appears controlled on 5/9 4. Mood:Provide emotional support -antipsychotic agents: Seroquel 12.5 mg BID  5. Neuropsych: This patientis notcapable of making decisions on herown behalf.  Telesitter for safety 6. Skin/Wound Care:Routine skin checks 7. Fluids/Electrolytes/Nutrition:Routine in and outs 8. Right maxillary sinus, right lateral orbital wall and floor fractures with fat herniation. Follow-up outpatient Dr Leta Baptist. No surgical intervention planned 9. 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 Follow-up as outpatient. 10. Hypertension. Norvasc 5 mg daily.  Controlled on 5/9  Monitor his increase mobility 11. Incidental finding of abnormal appearing endometrium identified on MRI lumbar spine. Follow-up outpatient OB/GYN 12. Right distal comminuted distal radius fracture and minimally displaced radial head fracture. No surgical intervention at this time follow-up outpatient Dr. Merlyn Lot.  -Nonweightbearing with splintRUE. -encourage pt to keep splint in place 13.   Acute blood loss anemia  Hemoglobin 10.2 on 5/9  Continue to monitor  LOS: 11 days A FACE TO FACE EVALUATION WAS PERFORMED  Danielle Kirby Danielle Kirby 08/13/2020, 12:51 PM

## 2020-08-13 NOTE — Progress Notes (Signed)
Occupational Therapy Session Note  Patient Details  Name: Danielle Kirby MRN: [id number] Date of Birth: Nov 12, 1945  Today's Date: 08/13/2020 OT Individual Time: 1300-1415 OT Individual Time Calculation (min): 75 min    Short Term Goals: Week 2:  OT Short Term Goal 1 (Week 2): Patient will complete toilet transfer min A. OT Short Term Goal 2 (Week 2): Patient will complete LB dressing mod A. OT Short Term Goal 3 (Week 2): Patient will complete grooming task with 2 or less vc's for initiation/termination. OT Short Term Goal 4 (Week 2): Patient will complete 2/3 steps of toileting with min A for advancing pants past hips.  Skilled Therapeutic Interventions/Progress Updates:    Pt received sitting in recliner with family and NT present in room, agreeable to OT session, and no pain reported. Pt donned tennis shoes min A. Pt ambulated ~20' min A +2 shower chair<>recliner with vc's and assistance to maintain NWB precautions on RUE. Pt's PICC line covered with flexi seal and sugar tong splint covered for showering. Completed shower min A seated in [place] and in stance with increased vc's for redirection, reassurance, and initiation/termination to wash specific body parts. Perseveration noted on purpose of shower in the middle of the day. UB dressing mod A; LB dressing max A to pull brief and pants over hips in stance.   Pt engaged in [place] activities focusing on dynamic standing balance, activity tolerance, and cognition/vision as follows: Trial 1: sequencing letters A-Z, attempted in standing but required seated rest break halfway. Mod VC's required for sequencing letters. Trial 2: sequencing numbers 1-20 completed in standing and no cues for correct sequencing.   Attempted visual/perception activity of 20-30 large piece puzzle seated in w/c. Task downgraded to sort pieces with straight edge, but task required increased vc's for understanding and follow-through.   End of session, pt left in recliner,  alarm set, all needs met, and call bell in reach with staff present.   Therapy Documentation Precautions:  Precautions Precautions: Fall Precaution Comments: RUE sling and sugar tong splint Required Braces or Orthoses: Sling Restrictions Weight Bearing Restrictions: Yes RUE Weight Bearing: Non weight bearing  Pain: Pain Assessment Pain Scale: 0-10 Pain Score: 0-No pain   Therapy/Group: Individual Therapy  Mellissa Kohut 08/13/2020, 4:27 PM

## 2020-08-13 NOTE — Progress Notes (Addendum)
Speech Language Pathology TBI Note  Patient Details  Name: Danielle Kirby MRN: [id number] Date of Birth: 1945/05/08  Today's Date: 08/13/2020 SLP Individual Time: 0920-1020 SLP Individual Time Calculation (min): 60 min  Short Term Goals: Week 2: SLP Short Term Goal 1 (Week 2): Patient will initiate functional tasks in 75% of opportunities with Mod A verbal cues. SLP Short Term Goal 2 (Week 2): Patient will demonstrate sustained attention to functional tasks for 2 minutes with Max verbal cues for redirection. SLP Short Term Goal 3 (Week 2): Patient will keep her eyes open for ~10 minute intervals with Max A multimodal cues. SLP Short Term Goal 4 (Week 2): Patient will demonstrate orientation to place, time and orientation with Mod A multimodal cues. SLP Short Term Goal 5 (Week 2): Patient will maintain topic of conversation for ~3 turns with Mod A verbal cues.  Skilled Therapeutic Interventions: Skilled SLP intervention focused on cognition. Pt very perseverative and confused this session. She was attempting to get out of bed and appeared very offended when slp requested for pt to wait for assistance. She stated "I dont need help I can do this on my own" She required instructions for sequencing steps with taking a wash up at sink and having brief change multiple times. Max A verbal and visual cues needed for safety and reasoning in current environment. Pt left seated upright in wheelchair with chair alarm set and all needs within reach.      Pain Pain Assessment Pain Scale: Faces Faces Pain Scale: No hurt  Agitated Behavior Scale: TBI Observation Details Observation Environment: room Start of observation period - Date: 08/13/20 Start of observation period - Time: 0920 End of observation period - Date: 08/13/20 End of observation period - Time: 1020 Agitated Behavior Scale (DO NOT LEAVE BLANKS) Short attention span, easy distractibility, inability to concentrate: Present to a moderate  degree Impulsive, impatient, low tolerance for pain or frustration: Absent Uncooperative, resistant to care, demanding: Present to a slight degree Violent and/or threatening violence toward people or property: Absent Explosive and/or unpredictable anger: Absent Rocking, rubbing, moaning, or other self-stimulating behavior: Absent Pulling at tubes, restraints, etc.: Absent Wandering from treatment areas: Absent Restlessness, pacing, excessive movement: Absent Repetitive behaviors, motor, and/or verbal: Present to a slight degree Rapid, loud, or excessive talking: Absent Sudden changes of mood: Absent Easily initiated or excessive crying and/or laughter: Absent Self-abusiveness, physical and/or verbal: Absent Agitated behavior scale total score: 18  Therapy/Group: Individual Therapy  Danielle Kirby Danielle Kirby 08/13/2020, 2:08 PM

## 2020-08-14 MED ORDER — QUETIAPINE FUMARATE 25 MG PO TABS
25.0000 mg | ORAL_TABLET | Freq: Every day | ORAL | Status: DC
  Administered 2020-08-14 – 2020-08-29 (×15): 25 mg via ORAL
  Filled 2020-08-14 (×18): qty 1

## 2020-08-14 MED ORDER — QUETIAPINE FUMARATE 25 MG PO TABS
12.5000 mg | ORAL_TABLET | Freq: Once | ORAL | Status: AC
  Administered 2020-08-14: 12.5 mg via ORAL

## 2020-08-14 NOTE — Progress Notes (Signed)
Occupational Therapy Session Note  Patient Details  Name: Danielle Kirby MRN: [id number] Date of Birth: [birth year]  Today's Date: 08/14/2020 OT Individual Time: 1100-1200  &   1430-1505 OT Individual Time Calculation (min): 60 min    &   35 min   Short Term Goals: Week 2:  OT Short Term Goal 1 (Week 2): Patient will complete toilet transfer min A. OT Short Term Goal 2 (Week 2): Patient will complete LB dressing mod A. OT Short Term Goal 3 (Week 2): Patient will complete grooming task with 2 or less vc's for initiation/termination. OT Short Term Goal 4 (Week 2): Patient will complete 2/3 steps of toileting with min A for advancing pants past hips.  Skilled Therapeutic Interventions/Progress Updates:    AM session:   Patient seated in chair, husband present at start of session.  She is pleasant and cooperative, although confused regarding situation t/o session.  Sit to stand and ambulation with NBQC to/from chair, toilet, shower bench, w/c with CGA and min cues.  Completed oral care seated with set up, toileting min A for pants up, shower with min A for left arm.  Dressing completed seated on w/c surface - min A for [place] shirt, min/mod A for brief and pants, mod A for clothing management in stance.  She is able to donn slipper socks with min A.  She returned to recliner at close of session, seat belt alarm set and callbell / tray table in reach.     PM session:   Patient seated in recliner with nurse techs, hyper-verbal, agitated and restless stating that we are keeping her against her will and she needs to find her car.  Offered ambulation but she declined.  Removed seat belt alarm - she remained seated in recliner busy packing and re-packing items within reach in suit case.  Seated balance in unsupported position with CS.  She did not attempt to stand.  She remained in recliner at close of session, allowing seat belt alarm to be put back on and activated.  She appears fairly calm at this time.   Nursing aware/present to provide supervision and medication.       Therapy Documentation Precautions:  Precautions Precautions: Fall Precaution Comments: RUE sling and sugar tong splint Required Braces or Orthoses: Sling Restrictions Weight Bearing Restrictions: Yes RUE Weight Bearing: Non weight bearing   Therapy/Group: Individual Therapy  [name] 08/14/2020, 7:49 AM

## 2020-08-14 NOTE — Progress Notes (Signed)
Physical Therapy Session Note  Patient Details  Name: Danielle Kirby MRN: [id number] Date of Birth: [birth year]  Today's Date: 08/14/2020 PT Individual Time: 1300-1400 PT Individual Time Calculation (min): 60 min   Short Term Goals: Week 2:  PT Short Term Goal 1 (Week 2): Patient will ambulate 100' with LRAD and min A. PT Short Term Goal 2 (Week 2): Patient will demonstrate standing for 3 minutes for functional task with min cues for sustained attention to task. PT Short Term Goal 3 (Week 2): Patient will negotiate 4 (6') steps with min A with rails. PT Short Term Goal 4 (Week 2): Patient will demonstrate functional transfers with CGA.  Skilled Therapeutic Interventions/Progress Updates:    Patient in recliner in room.  Denies pain.  Sit to stand to QC with min A.  Patient questioning where her purse, car, keys are throughout session, so re-oriented frequently about arriving in ambulance so no purse, keys or car are here.  Patient ambulated with QC and min A x 160' with w/c follow for safety with occasional cues for cane placement for safety.  Patient negotiated 12 steps with rail and min A with step to vs step through technique.  Patient seated to rest as reported fatigue.  Patient initated activity for sustained attention in standing, but quickly fatigued, but performed seated keeping attention in moderately distracting environment for 15 minutes (with another patient, his wife, therapist and interpreter working closeby).  Patient assisted in w/c to room and performed stand step transfer to w/c.  Assisted in w/c to room and performed stand step transfer to recliner.  Left with alarm belt active and all needs in reach. Therapy Documentation Precautions:  Precautions Precautions: Fall Precaution Comments: RUE sling and sugar tong splint Required Braces or Orthoses: Sling Restrictions Weight Bearing Restrictions: Yes RUE Weight Bearing: Non weight bearing Pain: Pain Assessment Faces Pain  Scale: No hurt    Therapy/Group: Individual Therapy  Elray Mcgregor  Sheran Lawless, PT 08/14/2020, 4:58 PM

## 2020-08-14 NOTE — Progress Notes (Signed)
[facility] PHYSICAL MEDICINE & REHABILITATION PROGRESS NOTE  Subjective/Complaints: Patient seen sitting up in bed this AM, eating breakfast.  She states she slept well overnight.  No reported issues overnight.  Per nursing, pt sleeping on/off.    ROS: limited by cognition.  Objective: Vital Signs: Blood pressure 140/81, pulse 86, temperature 98.3 F (36.8 C), resp. rate 17, SpO2 100 %. No results found. Recent Labs    08/13/20 0532  WBC 3.7*  HGB 10.2*  HCT 33.6*  PLT 329   No results for input(s): NA, K, CL, CO2, GLUCOSE, BUN, CREATININE, CALCIUM in the last 72 hours.  Intake/Output Summary (Last 24 hours) at 08/14/2020 1158 Last data filed at 08/14/2020 0845 Gross per 24 hour  Intake 415 ml  Output --  Net 415 ml        Physical Exam: BP 140/81 (BP Location: Left Arm)   Pulse 86   Temp 98.3 F (36.8 C)   Resp 17   SpO2 100%   Constitutional: No distress . Vital signs reviewed. HENT: Normocephalic.  Atraumatic. Eyes: EOMI. No discharge. Cardiovascular: No JVD.  RRR. Respiratory: Normal effort.  No stridor.  Bilateral clear to auscultation. GI: Non-distended.  BS +. Skin: Warm and dry.  RUE splint CDI. Psych: Normal mood.  Normal behavior. Musc: No edema in extremities.  No tenderness in extremities. Neuro: Alert and oriented x2 Makes eye contact with examiner, stable.  Limited insight and awareness.  Motor: LUE/LLE: 4-4+/5 proximal distal, appears stable RUE: Shoulder abduction 4/5, distally limited by splint, appears unchanged RLE: 4/5 proximal to distal, appears stable  Assessment/Plan: 1. Functional deficits which require 3+ hours per day of interdisciplinary therapy in a comprehensive inpatient rehab setting.  Physiatrist is providing close team supervision and 24 hour management of active medical problems listed below.  Physiatrist and rehab team continue to assess barriers to discharge/monitor patient progress toward functional and medical  goals   Care Tool:  Bathing    Body parts bathed by patient: Right arm,Chest,Abdomen,Front perineal area,Buttocks,Right upper leg,Left upper leg,Right lower leg,Left lower leg,Face   Body parts bathed by helper: Buttocks,Right upper leg,Left upper leg,Right lower leg,Left lower leg     Bathing assist Assist Level: Minimal Assistance - Patient > 75%     Upper Body Dressing/Undressing Upper body dressing   What is the patient wearing?: Pull over shirt (Front Zip Jacket)    Upper body assist Assist Level: Moderate Assistance - Patient 50 - 74%    Lower Body Dressing/Undressing Lower body dressing      What is the patient wearing?: Pants     Lower body assist Assist for lower body dressing: Maximal Assistance - Patient 25 - 49%     Toileting Toileting    Toileting assist Assist for toileting: Maximal Assistance - Patient 25 - 49%     Transfers Chair/bed transfer  Transfers assist     Chair/bed transfer assist level: Minimal Assistance - Patient > 75%     Locomotion Ambulation   Ambulation assist   Ambulation activity did not occur: Safety/medical concerns  Assist level: Minimal Assistance - Patient > 75% Assistive device: Cane-quad Max distance: 40'   Walk 10 feet activity   Assist  Walk 10 feet activity did not occur: Safety/medical concerns  Assist level: Minimal Assistance - Patient > 75% Assistive device: Cane-quad   Walk 50 feet activity   Assist Walk 50 feet with 2 turns activity did not occur: Safety/medical concerns  Walk 150 feet activity   Assist Walk 150 feet activity did not occur: Safety/medical concerns         Walk 10 feet on uneven surface  activity   Assist Walk 10 feet on uneven surfaces activity did not occur: Safety/medical concerns   Assist level: Moderate Assistance - Patient - 50 - 74% Assistive device: Youth worker Will patient use wheelchair at discharge?: No    Wheelchair activity did not occur: Safety/medical concerns         Wheelchair 50 feet with 2 turns activity    Assist    Wheelchair 50 feet with 2 turns activity did not occur: Safety/medical concerns       Wheelchair 150 feet activity     Assist  Wheelchair 150 feet activity did not occur: Safety/medical concerns        Medical Problem List and Plan: 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 Completed 7-day course of Keppra for seizure prophylaxis. Follow-up Dr. Conchita Paris  Continue CIR 2. 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 No current plans for long-term anticoagulation due to risk of fall/SDH.   Follow-up vascular study later this week -antiplatelet therapy: Lovenox 30 mg every 12 hours 3. Pain Management:Naprosyn 250 mg twice daily, Ultram as needed  Appears controlled on 5/9 4. Mood:Provide emotional support -antipsychotic agents: Seroquel 12.5 mg qhs increased to 25 on 5/10 5. Neuropsych: This patientis notcapable of making decisions on herown behalf.  Telesitter for safety 6. Skin/Wound Care:Routine skin checks 7. Fluids/Electrolytes/Nutrition:Routine in and outs 8. Right maxillary sinus, right lateral orbital wall and floor fractures with fat herniation. Follow-up outpatient Dr Leta Baptist. No surgical intervention planned 9. 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 Follow-up as outpatient. 10. Hypertension. Norvasc 5 mg daily.  Controlled on 5/10  Monitor his increase mobility 11. Incidental finding of abnormal appearing endometrium identified on MRI lumbar spine. Follow-up outpatient OB/GYN 12. Right distal comminuted distal radius fracture and minimally displaced radial head fracture. No surgical intervention at this time follow-up outpatient Dr. Merlyn Lot.  -Nonweightbearing with splintRUE. -encourage pt to keep splint  in place 13.  Acute blood loss anemia  Hemoglobin 10.2 on 5/9  Continue to monitor 14. Sleep disturbance  Seroquel ordered  Sleep chart ordered  LOS: 12 days A FACE TO FACE EVALUATION WAS PERFORMED  Jaasiel [place] Karis Juba 08/14/2020, 11:58 AM

## 2020-08-14 NOTE — Progress Notes (Signed)
Speech Language Pathology Daily Session Note  Patient Details  Name: YAILYN STRACK MRN: [id number] Date of Birth: 02-01-1946  Today's Date: 08/14/2020 SLP Individual Time: 0815-0858 SLP Individual Time Calculation (min): 43 min  Short Term Goals: Week 2: SLP Short Term Goal 1 (Week 2): Patient will initiate functional tasks in 75% of opportunities with Mod A verbal cues. SLP Short Term Goal 2 (Week 2): Patient will demonstrate sustained attention to functional tasks for 2 minutes with Max verbal cues for redirection. SLP Short Term Goal 3 (Week 2): Patient will keep her eyes open for ~10 minute intervals with Max A multimodal cues. SLP Short Term Goal 4 (Week 2): Patient will demonstrate orientation to place, time and orientation with Mod A multimodal cues. SLP Short Term Goal 5 (Week 2): Patient will maintain topic of conversation for ~3 turns with Mod A verbal cues.  Skilled Therapeutic Interventions: Skilled SLP intervention focused on cognition. Pt required redirection to tx tasks frequently during session. She was able to maintain topic for 2-3 exchanges then became perseverative on gong home and getting her suitcase together. Pt oriented to date, day of the week, and month using calender as visual aid and verbal cues to determine day of the week. Max A verblal cues needed to sustain attention with simple 3 step card sequencing task. Pt left seated upright in wheelchair with chair alarm set and all needs within reach. Cont with therapy per plan of care.      Pain Pain Assessment Pain Scale: Faces Faces Pain Scale: No hurt  Therapy/Group: Individual Therapy  Carlean Jews Shilee Biggs 08/14/2020, 8:59 AM

## 2020-08-15 MED ORDER — TRAZODONE HCL 50 MG PO TABS: 50.0000 mg | ORAL_TABLET | Freq: Every day | ORAL | Status: DC

## 2020-08-15 MED ORDER — MELATONIN 3 MG PO TABS
1.5000 mg | ORAL_TABLET | Freq: Every day | ORAL | Status: DC
  Administered 2020-08-15 – 2020-08-29 (×14): 1.5 mg via ORAL
  Filled 2020-08-15 (×15): qty 1

## 2020-08-15 NOTE — Progress Notes (Signed)
Physical Therapy Session Note  Patient Details  Name: Danielle Kirby MRN: [id number] Date of Birth: 1945/10/21  Today's Date: 08/15/2020 PT Individual Time: 0915-1000 PT Individual Time Calculation (min): 45 min   Short Term Goals: Week 2:  PT Short Term Goal 1 (Week 2): Patient will ambulate 100' with LRAD and min A. PT Short Term Goal 2 (Week 2): Patient will demonstrate standing for 3 minutes for functional task with min cues for sustained attention to task. PT Short Term Goal 3 (Week 2): Patient will negotiate 4 (6') steps with min A with rails. PT Short Term Goal 4 (Week 2): Patient will demonstrate functional transfers with CGA.  Skilled Therapeutic Interventions/Progress Updates:    Session1:  Patient in recliner in room.  Aware of signs on table in front of here and talking about [facility] and her brother who lives in [place] and was Chiropractor in [employer]  Patient after delay performed sit to stand with CGA.  Ambulated with min A/CGA using QC keeping w/c close in case pt fatigues.  Ambulated 100' to dayroom.  Seated in Nu Step performed L UE and bilat LE x about 1 min prior to requesting to toilet.  Patient assisted in w/c to bathroom in her room.  Ambulated with QC into bathroom CGA.  Mod cues for clothing management, pt performed toileting with min to mod A for R side of clothing.  Patient washed hands at sink with min cues for finding soap.  Seated EOB discussed pt's fall history and things to do to reduce fall risk, what to do in case of a fall (pt reported keeps her phone close) and demonstrated fall recovery if she is assisted and has no injuries.  Patient declined to attempt after demonstration.  Patient assisted to recliner with QC and min A.  Left with alarm belt on and call bell and b/s table in reach.  Session2:  Patient in recliner with head on the b/s table.  Performed sit to stand with CGA.  Patient left cane and began walking, LOB toward bed with pt catching hold of  footboard and mod A to recover.  Patient encouraged to use cane and ambulated to w/c with cane and min A.  Patient assisted in w/c to therapy gym.  Patient negotiated 12 steps with 1 rail with step through technique and min A.  Patient at counter performed side stepping, forward partial tandem, stepping out and in for coordination/timing and side kicks x 10 and heel raises x 10.  Assisted in w/c to room and stand to ambulate to recliner with QC with min A.  Left with seat belt alarm, call bell and needs in reach.  Therapy Documentation Precautions:  Precautions Precautions: Fall Precaution Comments: RUE sling and sugar tong splint Required Braces or Orthoses: Sling Restrictions Weight Bearing Restrictions: Yes RUE Weight Bearing: Non weight bearing Pain: Pain Assessment Pain Scale: 0-10 Pain Score: 0-No pain   Therapy/Group: Individual Therapy  Elray Mcgregor  Sheran Lawless, PT 08/15/2020, 11:17 AM

## 2020-08-15 NOTE — Progress Notes (Signed)
Patient ID: Danielle Kirby, female   DOB: 1945-08-07, 75 y.o.   MRN: [id number]  Met with pt and spoke with [name]-boyfriend to update team conference and progress this week. Both pleased with her progress this week and hope it will continue. Pt still having restlessness at night and agitation. [name] is hopeful this will get better. He comes to visit daily as long as her sister's have not already come they usually come every Wed. Will continue to work on discharge needs. [employer] discharge 5/26

## 2020-08-15 NOTE — Progress Notes (Signed)
Speech Language Pathology Daily Session Note  Patient Details  Name: Danielle Kirby MRN: [id number] Date of Birth: [birth year]  Today's Date: 08/15/2020 SLP Individual Time: 1300-1345 SLP Individual Time Calculation (min): 45 min  Short Term Goals: Week 2: SLP Short Term Goal 1 (Week 2): Patient will initiate functional tasks in 75% of opportunities with Mod A verbal cues. SLP Short Term Goal 2 (Week 2): Patient will demonstrate sustained attention to functional tasks for 2 minutes with Max verbal cues for redirection. SLP Short Term Goal 3 (Week 2): Patient will keep her eyes open for ~10 minute intervals with Max A multimodal cues. SLP Short Term Goal 4 (Week 2): Patient will demonstrate orientation to place, time and orientation with Mod A multimodal cues. SLP Short Term Goal 5 (Week 2): Patient will maintain topic of conversation for ~3 turns with Mod A verbal cues.  Skilled Therapeutic Interventions: Skilled SLP intervention focused on cognition. Pt sustained attention for 4-5 minutes with min A verbal direction for card sorting task. She occasionally responded to simple questions while completing task. She oriented to date and day of the week with calender in room with min A this session. Pt continues to demonstrate confusion regarding purpose of rehab stay. Some improvement with written aids for recall but overall demonstrates poor recall or carryover of information reviewed each session. Cont with therapy per plan of care.       Pain Pain Assessment Pain Scale: Faces Pain Score: 0-No pain Faces Pain Scale: No hurt  Therapy/Group: Individual Therapy  Carlean Jews Chandlar Guice 08/15/2020, 1:43 PM

## 2020-08-15 NOTE — Consult Note (Signed)
Neuropsychological Consultation   Patient:   Danielle Kirby   DOB:   1946-02-28  MR Number:  [id number]  Location:  [facility] [address] CENTER A [id number] [place] [address] Dept: [phone] Loc: [phone]           Date of Service:   08/15/2020  Start Time:   2 PM End Time:   3 PM  Provider/Observer:  Arley Phenix, Psy.D.       Clinical Neuropsychologist       Billing Code/Service: 16384  Chief Complaint:    Danielle Kirby is a 75 year old female with unremarkable past medical history other than cataracts and osteoarthritis.  Patient presented on 07/26/2020 after reports of multiple falls.  Patient was reported to [facility] first fall several days prior to admission where she fell down 4-6 stairs.  She later fell again in the bathroom and was found by significant other Malena Peer) unresponsive.  Cranial CT scan showed small right thalamocapsular hypodensity possibly representing age-indeterminate lacunar infarction.  There was a small 3 mm largely low-density left cerebral convexity subdural collection suspicious for subdural hematoma likely in part chronic.  Right periorbital and premaxillary soft tissue contusion.  Fractures of wall of the right maxillary sinus with layering edema sinus.  Additional fractures of the right lateral orbital wall and orbital floor with herniation small amount of fat through the orbital floor defect and involvement of the infraorbital foraman.  CT angiogram showed a small 1 to 2 mm left supraclinoid ICA aneurysm versus other vessel abnormality too small to visualize.  MRI of brain showed no acute infarction.  Numerous small foci of susceptibility artifact within the posterior right temporal and right parietal occipital region without associated edema or hyperdensity compatible with prior microhemorrhages.  Patient has had significant episodes of agitation, restlessness and confusion  requiring Haldol.  Therapy having evaluations have been completed and she was referred for the comprehensive inpatient rehabilitation program.  Reason for Service:  Patient was referred for neuropsychological consultation due to ongoing cognitive deficits, mental status deficits and confusion.  Below is the HPI for the current admission.  [name] a 75 year old right-handed female with unremarkable past medical history except cataracts and osteoarthritis. Per chart review lives with significant other. Independent prior to admission and active. Two-level home 6 steps to entry. Presented 07/26/2020 after reports of multiple falls. The first fall was several days prior to admission where she fell down 4-6 stairs she then fell again in the bathroom and found by significant other unresponsive. Admission chemistries unremarkable except potassium 3.3 creatinine 1.08 alcohol negative. Cranial CT scan showed a small right thalamocapsular hypodensity possibly representing age-indeterminate lacunar infarction. Small 3 mm largely low-density left cerebral convexity subdural collection suspicious for subdural hematoma or hygroma likely in part chronic. Right periorbital and premaxillary soft tissue contusion. Fractures of walls of the right maxillary sinus with layering hemosinus. Additional fractures of the right lateral orbital wall and orbital floor with herniation small amount of fat through the orbital floor defect and involvement of the infraorbital foramen. CT angiogram head and neck no emergent large vessel occlusion or hemodynamically significant proximal stenosis intracranially. Approximately 50% stenosis of the proximal left ICA secondary to predominantly calcified atherosclerosis. 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 MRI of the brain showed no acute infarction. Numerous small foci of susceptibility artifact within the  posterior right temporal and right parietal occipital  region without associated edema or hyperdensity compatible with prior microhemorrhages. MRI lumbar spine showed mild multilevel degenerative change as well as incidental abnormal appearance of the endometrium partially imaged but suspicious for possible cancer and patient was to follow-up outpatient OB/GYN. CT of the right wrist/elbow showed comminuted mildly impacted right intra-articular fracture of the distal radius, minimally displaced ulnar styloid fracture. Follow-up neurosurgery regards to small amount of subdural blood as well as incidental left supraclinoid ICA aneurysm follow-up outpatient Dr. Conchita Paris. Dr. Merlyn Lot follow-up for comminuted distal radius fracture minimally displaced radial head fracture recommend sugar-tong splint follow-up in 1 week there was some discussion of possible ORIF of distal radius. Follow-up Dr. Leta Baptist in regards to facial fractures nonoperative follow-up outpatient as well as recommendations of ophthalmology eye exam. Therapy evaluations completed right upper extremity nonweightbearing, right upper extremity sling with a sugar-tong splint. Venous Doppler studies 07/30/2020 showed age-indeterminate DVT left peroneal vein. Given recurrent falls/SDH no current recommendations for anticoagulation advised follow-up vascular study. She was maintained on subcutaneous Lovenox.. Maintained on Keppra for seizure prophylaxis. Patient did have episodes of agitation restlessness requiring Haldol. Therapy evaluations completed due to patient's traumatic brain injury was admitted for a comprehensive rehab program  Current Status:  Patient was sitting in her reclining chair in the upright position with her rolling table in front of her with stuffed bear and other items.  She was awake and alert but only oriented to place.  Patient was confused as to why she was in the hospital stating that she was here to learn things and  questioned the reason for her being there.  Patient was not able to explain that she had had a significant fall with injury and does not remember any of the events.  She was questioning the accuracy of events depicting a TBI.  Patient was disoriented to time reporting that it was 2004, and situation but only oriented to person and place.  Patient repeated questions over and over or repeated disbelief at some of the answers.  Patient attempted multiple times during the time I was there to take off her Velcro strap and at one point she did setting off alarms.  Patient reported that this was the first time she had seen or heard alarms go off even though nursing reports of multiple occasions of her taking off her restraint.  Patient does continue to be a significant fall risk and safety risk.  She is actively being monitored through video monitor with safety alert straps in place.  Patient with significant executive functioning deficits, confusion and disorientation.  Memory is significantly impaired for new learning and she was not able to repeat information that I provided her just a couple minutes later.  Close monitoring/supervision will be required with this patient for at least the immediate future.  Patient is not competent to be making significant decisions and will need outside help for planning and goals and will need constant reminders and constant max assistance for decision and activities.  Patient impulsive and disoriented.  Behavioral Observation: MCCAYLA SHIMADA  presents as a 75 y.o.-year-old Right African American Female who appeared her stated age. her dress was Appropriate and she was Well Groomed and her manners were Appropriate, inappropriate to the situation.  her participation was indicative of Inattentive behaviors.  There were physical disabilities noted.  she displayed an inappropriate level of cooperation and motivation.     Interactions:    Active Inattentive and  Redirectable  Attention:   abnormal and attention span  appeared shorter than expected for age  Memory:   abnormal; remote memory intact, recent memory impaired  Visuo-spatial:  not examined  Speech (Volume):  normal  Speech:   normal; normal  Thought Process:  Circumstantial and Tangential  Though Content:  Rumination; not suicidal and not homicidal  Orientation:   person and place  Judgment:   Poor  Planning:   Poor  Affect:    Defensive and Irritable  Mood:    Irritable  Insight:   Lacking  Intelligence:   normal   Medical History:   Past Medical History:  Diagnosis Date  . Allergy   . Arthritis   . Cataract   . Heart murmur          Patient Active Problem List   Diagnosis Date Noted  . Deep venous thrombosis (DVT) of left peroneal vein (HCC)   . Post-operative pain   . Benign essential HTN   . Radial head fracture   . Acute blood loss anemia   . Essential hypertension   . Traumatic injury of head with altered mental status   . 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 Degenerative arthritis of hip 05/23/2011       Abuse/Trauma History: Patient with recent events with falls and TBI.  Patient has no recall or knowledge of these events.  Psychiatric History:  No prior psychiatric history.  Family Med/Psych History:  Family History  Problem Relation Age of Onset  . Diabetes Mother   . Hypertension Father     Risk of Suicide/Violence: virtually non-existent patient has no risk of purposeful self harming behaviors or externalize violent behaviors although there are significant risk due to her cognitive deficits from her TBI.  Impression/DX:  Allayne GitelmanBrenda P Burkholder is a 75 year old female with unremarkable past medical history other than cataracts and osteoarthritis.  Patient presented on 07/26/2020 after reports of multiple falls.  Patient was reported to [facility]  first fall several days prior to admission where she fell down 4-6 stairs.  She later fell again in the bathroom and was found by significant other Malena Peer([place]) unresponsive.  Cranial CT scan showed small right thalamocapsular hypodensity possibly representing age-indeterminate lacunar infarction.  There was a small 3 mm largely low-density left cerebral convexity subdural collection suspicious for subdural hematoma likely in part chronic.  Right periorbital and premaxillary soft tissue contusion.  Fractures of wall of the right maxillary sinus with layering edema sinus.  Additional fractures of the right lateral orbital wall and orbital floor with herniation small amount of fat through the orbital floor defect and involvement of the infraorbital foraman.  CT angiogram showed a small 1 to 2 mm left supraclinoid ICA aneurysm versus other vessel abnormality too small to visualize.  MRI of brain showed no acute infarction.  Numerous small foci of susceptibility artifact within the posterior right temporal and right parietal occipital region without associated edema or hyperdensity compatible with prior microhemorrhages.  Patient has had significant episodes of agitation, restlessness and confusion requiring Haldol.  Therapy having evaluations have been completed and she was referred for the comprehensive inpatient rehabilitation program.  Patient was sitting in her reclining chair in the upright position with her rolling table in front of her with stuffed bear and other items.  She was awake and alert but only oriented to place.  Patient was confused as to why she was in the hospital stating that she  was here to learn things and questioned the reason for her being there.  Patient was not able to explain that she had had a significant fall with injury and does not remember any of the events.  She was questioning the accuracy of events depicting a TBI.  Patient was disoriented to time reporting that it was 2004, and  situation but only oriented to person and place.  Patient repeated questions over and over or repeated disbelief at some of the answers.  Patient attempted multiple times during the time I was there to take off her Velcro strap and at one point she did setting off alarms.  Patient reported that this was the first time she had seen or heard alarms go off even though nursing reports of multiple occasions of her taking off her restraint.  Patient does continue to be a significant fall risk and safety risk.  She is actively being monitored through video monitor with safety alert straps in place.  Patient with significant executive functioning deficits, confusion and disorientation.  Memory is significantly impaired for new learning and she was not able to repeat information that I provided her just a couple minutes later.  Close monitoring/supervision will be required with this patient for at least the immediate future.  Patient is not competent to be making significant decisions and will need outside help for planning and goals and will need constant reminders and constant max assistance for decision and activities.  Patient impulsive and disoriented.  Disposition/Plan:  Will follow up with the patient next week.  Diagnosis:    Radial head fracture - Plan: DG Wrist 2 Views Right, DG ELBOW COMPLETE RIGHT (3+VIEW), DG Wrist 2 Views Right, DG ELBOW COMPLETE RIGHT (3+VIEW)  Distal radius fracture - Plan: DG Wrist 2 Views Right, DG ELBOW COMPLETE RIGHT (3+VIEW), DG Wrist 2 Views Right, DG ELBOW COMPLETE RIGHT (3+VIEW)  Radial head fracture - Plan: DG Wrist 2 Views Right, DG ELBOW COMPLETE RIGHT (3+VIEW), DG Wrist 2 Views Right, DG ELBOW COMPLETE RIGHT (3+VIEW)  Distal radius fracture - Plan: DG Wrist 2 Views Right, DG ELBOW COMPLETE RIGHT (3+VIEW), DG Wrist 2 Views Right, DG ELBOW COMPLETE RIGHT (3+VIEW)         Electronically Signed   _______________________ Arley Phenix, Psy.D. Clinical  Neuropsychologist

## 2020-08-15 NOTE — Patient Care Conference (Signed)
Inpatient RehabilitationTeam Conference and Plan of Care Update Date: 08/15/2020   Time: 11:46 AM    Patient Name: Danielle Kirby      Medical Record Number: [id number]  Date of Birth: February 12, 1946 Sex: Female         Room/Bed: 4W13C/4W13C-01 Payor Info: Payor: MEDICARE / Plan: MEDICARE PART A / Product Type: *No Product type* /    Admit Date/Time:  08/02/2020  6:04 PM  Primary Diagnosis:  TBI (traumatic brain injury) [facility])  Hospital Problems: Principal Problem:   TBI (traumatic brain injury) (HCC) Active Problems:   Radial head fracture   Acute blood loss anemia   Essential hypertension   Traumatic injury of head with altered mental status   Post-operative pain   Benign essential HTN   Deep venous thrombosis (DVT) of left peroneal vein [facility])    Expected Discharge Date: Expected Discharge Date: 08/30/20  Team Members Present: Physician leading conference: Dr. Maryla Morrow Care Coodinator Present: Chana Bode, RN, BSN, CRRN;Becky Dupree, LCSW Nurse Present: Chana Bode, RN PT Present: Sheran Lawless, PT OT Present: Other (comment) Quentin Cornwall, OT) SLP Present: Other (comment) Fae Pippin, SLP) PPS Coordinator present : Fae Pippin, SLP     Current Status/Progress Goal Weekly Team Focus  Bowel/Bladder   Pt cont of B/B. Last BM-5/7  Pt gain regularity of Bowel.  Assess pr q shift and PRN. Offer PRN med.   Swallow/Nutrition/ Hydration             ADL's   bed mobility with CS, sit to stand and ambulation with NBQC for functional transfers with CGA and cues for safety, UB adl min A, LB adl mod A, toileting min A - behavior/safety better in am sessions, but more restless and agitated in afternoon (Tuesday)  CS  funcitonal mobility/transfers, adl training, cog/safety, visual/perceptual activities and family education   Mobility   min A transfers, S bed mobility, ambulated 150' with QC and w/c follow (+2), stairs with min to mod A x 12 with 1 rail.  Sustained attention to  pushpin task x 15 minutes in moderately distracting environment  S/CGA overall  mobility, safety, attention, memory, focus, activity tolerance, balance   Communication             Safety/Cognition/ Behavioral Observations  Mod-Max A  Mod-Min A  continue to address orientation, sustained attention, basic problem solving and recall   Pain   no c/o pain at this time  Pt remains pain free  assess pain q shift and PRN   Skin   skin intact.splint to R arm, NWB.  Skin remains free of breakdown or injury  assess skin q shift and PRN     Discharge Planning:  [name]-significaitn other will be pt's primary caregiver and is here daily to encourage pt in therapies. Aware she will require 24/7 care   Team Discussion: Remains confused, not sleeping well, with noted hallucinations. MD ordered sleep chart and added seroquel. Progress limited by poor sustained attention.   Patient on target to meet rehab goals: Currently min- mod assist for upper body care and mod assist for lower body care Mod - max assist for SLP. Able to ambulate 150' with a quad cane and manage steps with min assist. PT discharge goals set for SCG - Supervision and OT discharge goals set for min - mod assist.  *See Care Plan and progress notes for long and short-term goals.   Revisions to Treatment Plan:  Memory notebook initiated, working on sustained attention  in a moderately distracting environment  Teaching Needs: Safety, memory, transfers, toileting, medication management, etc.   Current Barriers to Discharge: Decreased caregiver support, Home enviroment access/layout and Behavior  Possible Resolutions to Barriers: Family education with significant other     Medical Summary Current Status: TBI/SDH secondary to multiple falls  Barriers to Discharge: Behavior;Medical stability;Weight bearing restrictions;Other (comments)  Barriers to Discharge Comments: DVT Possible Resolutions to Barriers/Weekly Focus: Therapies,  follow BP, follow labs - Hb, RUE splint, DVT U/S ordered, sleep/wake cycle   Continued Need for Acute Rehabilitation Level of Care: The patient requires daily medical management by a physician with specialized training in physical medicine and rehabilitation for the following reasons: Direction of a multidisciplinary physical rehabilitation program to maximize functional independence : Yes Medical management of patient stability for increased activity during participation in an intensive rehabilitation regime.: Yes Analysis of laboratory values and/or radiology reports with any subsequent need for medication adjustment and/or medical intervention. : Yes   I attest that I was present, lead the team conference, and concur with the assessment and plan of the team.   Chana Bode B 08/15/2020, 2:16 PM

## 2020-08-15 NOTE — Progress Notes (Addendum)
Nutrition Follow-up  DOCUMENTATION CODES:   Not applicable  INTERVENTION:   -Continue Ensure Enlive po BID, each supplement provides 350 kcal and 20 grams of protein -Magic cup TID with meals, each supplement provides 290 kcal and 9 grams of protein  NUTRITION DIAGNOSIS:   Increased nutrient needs related to other (see comment) (TBI) as evidenced by estimated needs.  Ongoing  GOAL:   Patient will meet greater than or equal to 90% of their needs  Progressing   MONITOR:   PO intake,Supplement acceptance,Weight trends  REASON FOR ASSESSMENT:   Malnutrition Screening Tool    ASSESSMENT:   75 year old female with PMH of cataracts and osteoarthritis. Presented 07/26/20 after reports of multiple falls. Pt found to have SDH and facial fractures. Pt also with right distal radius fracture and minimally displaced radial head fracture. Admitted to CIR on 4/28.  Reviewed I/O's: +330 ml x 24 hours and +5 L since admission  Spoke with pt, who was sitting in recliner chair at time of visit. Pt reports her appetite is improving and really enjoys the food here. Noted pt with erratic intake; meal completions 35-100%.   Noted Ensure on tray table- pt consumed about 25% of supplement. She shares that she enjoys them, especially the chocolate flavor. She is amenable to continue them. Discussed importance of good meal and supplement intake to promote healing.   Medications reviewed.   Labs reviewed.   Diet Order:   Diet Order            Diet regular Room service appropriate? Yes with Assist; Fluid consistency: Thin  Diet effective now                 EDUCATION NEEDS:   No education needs have been identified at this time  Skin:  Skin Assessment: Reviewed RN Assessment  Last BM:  08/14/20  Height:   Ht Readings from Last 1 Encounters:  07/26/20 5\' 7"  (1.702 m)    Weight:   Wt Readings from Last 1 Encounters:  07/26/20 83.6 kg   BMI:  There is no height or weight on  file to calculate BMI.  Estimated Nutritional Needs:   Kcal:  1700-1900  Protein:  85-100 grams  Fluid:  1.7 L/day    07/28/20, RD, LDN, CDCES Registered Dietitian II Certified Diabetes Care and Education Specialist Please refer to [facility] for RD and/or RD on-call/weekend/after hours pager

## 2020-08-15 NOTE — Progress Notes (Addendum)
[place] PHYSICAL MEDICINE & REHABILITATION PROGRESS NOTE  Subjective/Complaints: Patient seen sitting up, working with therapies this AM.  She did not sleep well overnight per report.  Discussed cognition as most limiting factor with therapies.   ROS: limited by cognition.   Objective: Vital Signs: Blood pressure (!) 152/80, pulse 76, temperature 98 F (36.7 C), resp. rate 16, SpO2 100 %. No results found. Recent Labs    08/13/20 0532  WBC 3.7*  HGB 10.2*  HCT 33.6*  PLT 329   No results for input(s): NA, K, CL, CO2, GLUCOSE, BUN, CREATININE, CALCIUM in the last 72 hours.  Intake/Output Summary (Last 24 hours) at 08/15/2020 1003 Last data filed at 08/15/2020 0805 Gross per 24 hour  Intake 570 ml  Output --  Net 570 ml        Physical Exam: BP (!) 152/80 (BP Location: Left Leg)   Pulse 76   Temp 98 F (36.7 C)   Resp 16   SpO2 100%   Constitutional: No distress . Vital signs reviewed. HENT: Normocephalic.  Atraumatic. Eyes: EOMI. No discharge. Cardiovascular: No JVD.  RRR. Respiratory: Normal effort.  No stridor.  Bilateral clear to auscultation. GI: Non-distended.  BS +. Skin: Warm and dry.   RUE splint CDI. Psych: Normal mood.  Normal behavior. Musc: No edema in extremities.  No tenderness in extremities. Neuro: Alert and oriented x1 Makes eye contact with examiner, stable.  Limited insight and awareness.  Motor: LUE/LLE: 4-4+/5 proximal distal, appears unchanged RUE: Shoulder abduction 4/5, distally limited by splint, appears unchanged RLE: 4/5 proximal to distal, appears stable  Assessment/Plan: 1. Functional deficits which require 3+ hours per day of interdisciplinary therapy in a comprehensive inpatient rehab setting.  Physiatrist is providing close team supervision and 24 hour management of active medical problems listed below.  Physiatrist and rehab team continue to assess barriers to discharge/monitor patient progress toward functional and  medical goals   Care Tool:  Bathing    Body parts bathed by patient: Right arm,Chest,Abdomen,Front perineal area,Buttocks,Right upper leg,Left upper leg,Right lower leg,Left lower leg,Face   Body parts bathed by helper: Left arm     Bathing assist Assist Level: Minimal Assistance - Patient > 75%     Upper Body Dressing/Undressing Upper body dressing   What is the patient wearing?: Pull over shirt    Upper body assist Assist Level: Minimal Assistance - Patient > 75%    Lower Body Dressing/Undressing Lower body dressing      What is the patient wearing?: Pants,Incontinence brief     Lower body assist Assist for lower body dressing: Moderate Assistance - Patient 50 - 74%     Toileting Toileting    Toileting assist Assist for toileting: Minimal Assistance - Patient > 75%     Transfers Chair/bed transfer  Transfers assist     Chair/bed transfer assist level: Contact Guard/Touching assist     Locomotion Ambulation   Ambulation assist   Ambulation activity did not occur: Safety/medical concerns  Assist level: Contact Guard/Touching assist Assistive device: Cane-quad Max distance: 40'   Walk 10 feet activity   Assist  Walk 10 feet activity did not occur: Safety/medical concerns  Assist level: Minimal Assistance - Patient > 75% Assistive device: Cane-quad   Walk 50 feet activity   Assist Walk 50 feet with 2 turns activity did not occur: Safety/medical concerns         Walk 150 feet activity   Assist Walk 150 feet activity did not occur:  Safety/medical concerns         Walk 10 feet on uneven surface  activity   Assist Walk 10 feet on uneven surfaces activity did not occur: Safety/medical concerns   Assist level: Moderate Assistance - Patient - 50 - 74% Assistive device: Youth worker Will patient use wheelchair at discharge?: No   Wheelchair activity did not occur: Safety/medical concerns          Wheelchair 50 feet with 2 turns activity    Assist    Wheelchair 50 feet with 2 turns activity did not occur: Safety/medical concerns       Wheelchair 150 feet activity     Assist  Wheelchair 150 feet activity did not occur: Safety/medical concerns        Medical Problem List and Plan: 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 Completed 7-day course of Keppra for seizure prophylaxis. Follow-up Dr. Conchita Paris  Continue CIR  Team conference today to discuss current and goals and coordination of care, home and environmental barriers, and discharge planning with nursing, case manager, and therapies. Please see conference note from today as well.  2. 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 No current plans for long-term anticoagulation due to risk of fall/SDH.   Follow-up vascular study ordered -antiplatelet therapy: Lovenox 30 mg every 12 hours 3. Pain Management:Naprosyn 250 mg twice daily, Ultram as needed  Appears controlled on 5/11 4. Mood:Provide emotional support -antipsychotic agents: Seroquel 12.5 mg qhs increased to 25 on 5/10, increased to 50 on 5/11 5. Neuropsych: This patientis notcapable of making decisions on herown behalf.  Telesitter for safety 6. Skin/Wound Care:Routine skin checks 7. Fluids/Electrolytes/Nutrition:Routine in and outs 8. Right maxillary sinus, right lateral orbital wall and floor fractures with fat herniation. Follow-up outpatient Dr Leta Baptist. No surgical intervention planned 9. 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 Follow-up as outpatient. 10. Hypertension. Norvasc 5 mg daily.  Labile on 5/11, monitor for trend  Monitor his increase mobility 11. Incidental finding of abnormal appearing endometrium identified on MRI lumbar spine. Follow-up outpatient OB/GYN 12. Right distal comminuted distal radius fracture and minimally  displaced radial head fracture. No surgical intervention at this time follow-up outpatient Dr. Merlyn Lot.  -Nonweightbearing with splintRUE. -encourage pt to keep splint in place 13.  Acute blood loss anemia  Hemoglobin 10.2 on 5/9  Continue to monitor 14. Sleep disturbance  Seroquel ordered, increased on 5/11  Melatonin ordered  ECG reviewed, will consider  Sleep chart ordered  LOS: 13 days A FACE TO FACE EVALUATION WAS PERFORMED  Andrus Sharp Karis Juba 08/15/2020, 10:03 AM

## 2020-08-15 NOTE — Progress Notes (Signed)
Occupational Therapy Session Note  Patient Details  Name: Danielle Kirby MRN: [id number] Date of Birth: [birth year]  Today's Date: 08/15/2020 OT Individual Time: 9447-3958 OT Individual Time Calculation (min): 60 min    Short Term Goals: Week 2:  OT Short Term Goal 1 (Week 2): Patient will complete toilet transfer min A. OT Short Term Goal 2 (Week 2): Patient will complete LB dressing mod A. OT Short Term Goal 3 (Week 2): Patient will complete grooming task with 2 or less vc's for initiation/termination. OT Short Term Goal 4 (Week 2): Patient will complete 2/3 steps of toileting with min A for advancing pants past hips.  Skilled Therapeutic Interventions/Progress Updates:    Pt received sitting in recliner, agreeable to therapy, no pain reported, Ox2. Pt perseverating on items in the room and her belongings requiring redirection/reassurance. Pt ambulated min A +2 for balance ~30' chair<>bathroom. Toileting mod A for clothing management in stance. UB dressing min A, LB dressing mod A to pull pants/incontinence brief over hips in stance. Pt showered with close spvsn-min A to reach RLE; PICC line covered in flexi seal and sugar tong splint covered. Showering requiring less redirection to task but vc's required d/t perseveration on bathing chest and buttocks. Non-skid slipper socks doffed with spvsn/donned mod A seated. Pt engaged in oral care sitting at sink with close spvsn, but requiring vc's for termination of task; perseveration on her denture partials "having a bump in them." Pt left seated in recliner, alarm set, and all needs met.  ABS completed. Pt increasingly skeptical of care and caregivers.   Therapy Documentation Precautions:  Precautions Precautions: Fall Precaution Comments: RUE sling and sugar tong splint Required Braces or Orthoses: Sling Restrictions Weight Bearing Restrictions: Yes RUE Weight Bearing: Non weight bearing  Pain: Pain Assessment Pain Scale: 0-10 Pain Score:  0-No pain    Therapy/Group: Individual Therapy  Mellissa Kohut 08/15/2020, 12:21 PM

## 2020-08-15 NOTE — Progress Notes (Signed)
Pt didn't sleep well last  Night. Total hours pt slept about 3 hours. Was impulsive, getting OOB , confused. Emotional support provided, re-oriented to time and place. Staff took turns to stay with Pt in the room to  provide safety.

## 2020-08-16 ENCOUNTER — Inpatient Hospital Stay (HOSPITAL_COMMUNITY): Payer: Medicare Other

## 2020-08-16 MED ORDER — LORAZEPAM 2 MG/ML IJ SOLN
0.5000 mg | Freq: Once | INTRAMUSCULAR | Status: AC
  Administered 2020-08-16: 0.5 mg via INTRAMUSCULAR
  Filled 2020-08-16: qty 1

## 2020-08-16 NOTE — Progress Notes (Signed)
Speech Language Pathology Daily Session Note  Patient Details  Name: Danielle Kirby MRN: [id number] Date of Birth: [birth year]  Today's Date: 08/16/2020 SLP Individual Time: 9485-4627 SLP Individual Time Calculation (min): 60 min  Short Term Goals: Week 2: SLP Short Term Goal 1 (Week 2): Patient will initiate functional tasks in 75% of opportunities with Mod A verbal cues. SLP Short Term Goal 2 (Week 2): Patient will demonstrate sustained attention to functional tasks for 2 minutes with Max verbal cues for redirection. SLP Short Term Goal 3 (Week 2): Patient will keep her eyes open for ~10 minute intervals with Max A multimodal cues. SLP Short Term Goal 4 (Week 2): Patient will demonstrate orientation to place, time and orientation with Mod A multimodal cues. SLP Short Term Goal 5 (Week 2): Patient will maintain topic of conversation for ~3 turns with Mod A verbal cues.  Skilled Therapeutic Interventions: Skilled SLP intervention focused on cognition. Pt demonstrated improvement in overall attention during conversation with more appropriate and on topic responses. She demonstrated adequate initiation with simple money calculation task using dollar bills and coins. Min-Mod A with visual and verbal cues needed with simple-mildly complex money calculations. Friend present at end of session. Educated him on pts current progress with goals and level of assistance that will be needed once home due to decreased safety awareness and cognition. Pt left seated upright in bed with call button within reach and bed alarm set.  Cont with therapy per plan of care.      Pain Pain Assessment Pain Scale: Faces Faces Pain Scale: No hurt  Therapy/Group: Individual Therapy  [name] A Jamielee Mchale 08/16/2020, 10:08 AM

## 2020-08-16 NOTE — Progress Notes (Signed)
[place] PHYSICAL MEDICINE & REHABILITATION PROGRESS NOTE  Subjective/Complaints: Slept well last night Cognition improving Vitals stable  ROS: limited by cognition.  Objective: Vital Signs: Blood pressure 124/87, pulse 91, temperature 98.2 F (36.8 C), resp. rate 17, SpO2 100 %. No results found. No results for input(s): WBC, HGB, HCT, PLT in the last 72 hours. No results for input(s): NA, K, CL, CO2, GLUCOSE, BUN, CREATININE, CALCIUM in the last 72 hours.  Intake/Output Summary (Last 24 hours) at 08/16/2020 1347 Last data filed at 08/16/2020 1300 Gross per 24 hour  Intake 170 ml  Output --  Net 170 ml        Physical Exam: BP 124/87 (BP Location: Left Arm)   Pulse 91   Temp 98.2 F (36.8 C)   Resp 17   SpO2 100%   Gen: no distress, normal appearing HEENT: oral mucosa pink and moist, NCAT Cardio: Reg rate Chest: normal effort, normal rate of breathing Abd: soft, non-distended Ext: no edema Psych: pleasant, normal affect Skin: Warm and dry.  RUE splint CDI. Psych: Normal mood.  Normal behavior. Musc: No edema in extremities.  No tenderness in extremities. Neuro: Alert and oriented x2 Makes eye contact with examiner, stable.  Limited insight and awareness.  Motor: LUE/LLE: 4-4+/5 proximal distal, appears stable RUE: Shoulder abduction 4/5, distally limited by splint, appears unchanged RLE: 4/5 proximal to distal, appears stable  Assessment/Plan: 1. Functional deficits which require 3+ hours per day of interdisciplinary therapy in a comprehensive inpatient rehab setting.  Physiatrist is providing close team supervision and 24 hour management of active medical problems listed below.  Physiatrist and rehab team continue to assess barriers to discharge/monitor patient progress toward functional and medical goals   Care Tool:  Bathing    Body parts bathed by patient: Right arm,Chest,Abdomen,Front perineal area,Buttocks,Right upper leg,Left upper leg,Left  lower leg,Face,Left arm   Body parts bathed by helper: Right lower leg     Bathing assist Assist Level: Minimal Assistance - Patient > 75%     Upper Body Dressing/Undressing Upper body dressing   What is the patient wearing?: Pull over shirt    Upper body assist Assist Level: Minimal Assistance - Patient > 75%    Lower Body Dressing/Undressing Lower body dressing      What is the patient wearing?: Pants,Incontinence brief     Lower body assist Assist for lower body dressing: Moderate Assistance - Patient 50 - 74%     Toileting Toileting    Toileting assist Assist for toileting: Moderate Assistance - Patient 50 - 74%     Transfers Chair/bed transfer  Transfers assist     Chair/bed transfer assist level: Minimal Assistance - Patient > 75%     Locomotion Ambulation   Ambulation assist   Ambulation activity did not occur: Safety/medical concerns  Assist level: Minimal Assistance - Patient > 75% Assistive device: Cane-quad Max distance: 100'   Walk 10 feet activity   Assist  Walk 10 feet activity did not occur: Safety/medical concerns  Assist level: Minimal Assistance - Patient > 75% Assistive device: Cane-quad   Walk 50 feet activity   Assist Walk 50 feet with 2 turns activity did not occur: Safety/medical concerns  Assist level: Minimal Assistance - Patient > 75%      Walk 150 feet activity   Assist Walk 150 feet activity did not occur: Safety/medical concerns         Walk 10 feet on uneven surface  activity   Assist Walk 10  feet on uneven surfaces activity did not occur: Safety/medical concerns   Assist level: Moderate Assistance - Patient - 50 - 74% Assistive device: Youth worker Will patient use wheelchair at discharge?: No   Wheelchair activity did not occur: Safety/medical concerns         Wheelchair 50 feet with 2 turns activity    Assist    Wheelchair 50 feet with 2 turns activity did  not occur: Safety/medical concerns       Wheelchair 150 feet activity     Assist  Wheelchair 150 feet activity did not occur: Safety/medical concerns        Medical Problem List and Plan: 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 Completed 7-day course of Keppra for seizure prophylaxis. Follow-up Dr. Conchita Paris  Continue CIR 2. 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 No current plans for long-term anticoagulation due to risk of fall/SDH.   Follow-up vascular study later this week -antiplatelet therapy: Continue Lovenox 30 mg every 12 hours 3. Pain Management:Continue Naprosyn 250 mg twice daily, Ultram as needed  Appears controlled on 5/12 4. Mood:Provide emotional support -antipsychotic agents: Seroquel 12.5 mg qhs increased to 25 on 5/10 5. Neuropsych: This patientis notcapable of making decisions on herown behalf.  Telesitter for safety 6. Skin/Wound Care:Routine skin checks 7. Fluids/Electrolytes/Nutrition:Routine in and outs 8. Right maxillary sinus, right lateral orbital wall and floor fractures with fat herniation. Follow-up outpatient Dr Leta Baptist. No surgical intervention planned 9. 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 Follow-up as outpatient. 10. Hypertension. Continue Norvasc 5 mg daily.  Controlled on 5/12  Monitor his increase mobility 11. Incidental finding of abnormal appearing endometrium identified on MRI lumbar spine. Follow-up outpatient OB/GYN 12. Right distal comminuted distal radius fracture and minimally displaced radial head fracture. No surgical intervention at this time follow-up outpatient Dr. Merlyn Lot.  -Nonweightbearing with splintRUE. -encourage pt to keep splint in place 13.  Acute blood loss anemia  Hemoglobin 10.2 on 5/9  Continue to monitor 14. Sleep disturbance  Seroquel ordered  Sleep chart  ordered  LOS: 14 days A FACE TO FACE EVALUATION WAS PERFORMED  Clint Bolder P Laurelyn Terrero 08/16/2020, 1:47 PM

## 2020-08-16 NOTE — Progress Notes (Signed)
Pt slept through the night.No acute distress.

## 2020-08-16 NOTE — Progress Notes (Signed)
Occupational Therapy Session Note  Patient Details  Name: Danielle Kirby MRN: [id number] Date of Birth: [birth year]  Today's Date: 08/16/2020 OT Individual Time: 1400-1500 OT Individual Time Calculation (min): 60 min    Short Term Goals: Week 2:  OT Short Term Goal 1 (Week 2): Patient will complete toilet transfer min A. OT Short Term Goal 2 (Week 2): Patient will complete LB dressing mod A. OT Short Term Goal 3 (Week 2): Patient will complete grooming task with 2 or less vc's for initiation/termination. OT Short Term Goal 4 (Week 2): Patient will complete 2/3 steps of toileting with min A for advancing pants past hips.  Skilled Therapeutic Interventions/Progress Updates:    Pt received sitting in recliner, agreeable to OT session and no pain reported. Pt ambulated with hand held A to w/c and taken to day room. Pt engaged in simple cognitive retraining tasks to improve memory/recall, problem solving, attention, awareness, and basic IADL skills. Tasks included sorting coupons into two categories/visual fields, medication management with four fields, and money management. Pt required max instructional and demonstrative cues to initiate each task and progressing to min-mod vc's to ensure accuracy/task completion, redirection, and attention to task.  Pt demonstrated poor awareness to errors in tasks, overall indicating need for 24/hr supervision for safety at d/c. Pt asked to ambulate back to room pushing w/c and navigate to room. Max vc's and visual cues required to find room; pt unable to orient without cues in hallway and see if room numbers were increasing or decreasing.   Pt left in recliner following session with alarm set, call bell in reach, and all needs met.   Therapy Documentation Precautions:  Precautions Precautions: Fall Precaution Comments: RUE sling and sugar tong splint Required Braces or Orthoses: Sling Restrictions Weight Bearing Restrictions: Yes RUE Weight Bearing: Non  weight bearing Pain: Pain Assessment Pain Scale: 0-10 Pain Score: 0-No pain   Therapy/Group: Individual Therapy  Mellissa Kohut 08/16/2020, 3:50 PM

## 2020-08-16 NOTE — Progress Notes (Signed)
Physical Therapy Session Note  Patient Details  Name: Danielle Kirby MRN: [id number] Date of Birth: April 20, 1945  Today's Date: 08/16/2020 PT Individual Time:Session1: 1030-1130; [place]: 8144-8185 PT Individual Time Calculation (min): 60 min & 30 min  Short Term Goals: Week 2:  PT Short Term Goal 1 (Week 2): Patient will ambulate 100' with LRAD and min A. PT Short Term Goal 2 (Week 2): Patient will demonstrate standing for 3 minutes for functional task with min cues for sustained attention to task. PT Short Term Goal 3 (Week 2): Patient will negotiate 4 (6') steps with min A with rails. PT Short Term Goal 4 (Week 2): Patient will demonstrate functional transfers with CGA.  Skilled Therapeutic Interventions/Progress Updates:    Session1:  Patient in supine with spouse in the room.  Patient feels the noise out in hallway coming from her family that are taking over her home.  Reoriented to hospital and staff in hallway causing noise.  Patient eager for OOB and impulsively trying to get out below lower bed rail.  Mod cues for redirection and waiting for assist.  Patient S for supine to sit and donned socks with S with mod cues for attention and shoes with min A.  Patient sit to stand with mod cues for hand placement and stepping to w/c using QC with CGA.  Assisted in w/c outside to first floor entrance.  Patient ambulated on unlevel paved surface with QC and min A to CGA with increased time 150' x 2 with seated rest to enjoy fountain.  Patient assisted in w/c to room and ambulated with QC with cues to use cane for safety to recliner.  Left with alarm belt active and call bell in reach, table with re-orienting materials on it.  Session2: Patient in recliner and NT in to check vitals.  Patient reporting her daughter "[name]" is on her schedule and going to be one of her therapists.  Re-oriented to schedule as printed.  Patient sit to stand to ambulate to w/c with CGA.  Assisted to dayroom in w/c.  Applied  Lite Gait harness and pt in standing for adjustment and attachment.  Patient ambulated on treadmill using Lite Gait for safety x 3:25 over 300', then seated rest and additional 2:00 to get to 5 minutes total.  Patient able to ambulate with improved fluency and less hesitancy on treadmill, but still confused as to what to do with her R hand since rails on both sides of treadmill and pt is NWB R UE.  Patient assisted in w/c to room and left in recliner with call bell in reach and alarm belt active.   Therapy Documentation Precautions:  Precautions Precautions: Fall Precaution Comments: RUE sling and sugar tong splint Required Braces or Orthoses: Sling Restrictions Weight Bearing Restrictions: Yes RUE Weight Bearing: Non weight bearing Pain: Pain Assessment Pain Scale: Faces Pain Score: 0-No pain Faces Pain Scale: No hurt   Therapy/Group: Individual Therapy  Elray Mcgregor  Sheran Lawless, PT 08/16/2020, 12:26 PM

## 2020-08-17 ENCOUNTER — Inpatient Hospital Stay (HOSPITAL_COMMUNITY): Payer: Medicare Other

## 2020-08-17 DIAGNOSIS — G479 Sleep disorder, unspecified: Secondary | ICD-10-CM

## 2020-08-17 DIAGNOSIS — I82402 Acute embolism and thrombosis of unspecified deep veins of left lower extremity: Secondary | ICD-10-CM

## 2020-08-17 NOTE — Progress Notes (Signed)
Patient very restless, anxious and agitated. Spit out night meds, refuses to stay in bed or chair, continuouly get out of bed and tries to be verbally/physically abusive to staff. Refer other progress note

## 2020-08-17 NOTE — Progress Notes (Signed)
Occupational Therapy Weekly Progress Note  Patient Details  Name: Danielle Kirby MRN: [id number] Date of Birth: 03/23/1946  Beginning of progress report period: Aug 10, 2020 End of progress report period: Aug 17, 2020  Today's Date: 08/17/2020 OT Individual Time: 1003-1101 OT Individual Time Calculation (min): 58 min    Patient has met 2 of 4 short term goals. Pt's mobility continues to improve for functional transfers and during BADLs, however pt is limited by cognitive deficits including memory, attention, initiation/termination, and sequencing. Mod-max multimodal cues are required for pt to successfully and safely complete ADLs and simple cognitive tasks.  Patient continues to demonstrate the following deficits: muscle weakness, decreased cardiorespiratoy endurance, decreased coordination, ideational apraxia, decreased initiation, decreased attention, decreased awareness, decreased problem solving, decreased safety awareness, decreased memory and delayed processing and decreased sitting balance, decreased standing balance, decreased postural control, decreased balance strategies and difficulty maintaining precautions and therefore will continue to benefit from skilled OT intervention to enhance overall performance with BADL and Reduce care partner burden.  Patient progressing toward long term goals..  Continue plan of care.  OT Short Term Goals Week 2:  OT Short Term Goal 1 (Week 2): Patient will complete toilet transfer min A. OT Short Term Goal 1 - Progress (Week 2): Met OT Short Term Goal 2 (Week 2): Patient will complete LB dressing mod A. OT Short Term Goal 2 - Progress (Week 2): Met OT Short Term Goal 3 (Week 2): Patient will complete grooming task with 2 or less vc's for initiation/termination. OT Short Term Goal 3 - Progress (Week 2): Progressing toward goal OT Short Term Goal 4 (Week 2): Patient will complete 2/3 steps of toileting with min A for advancing pants past hips. OT  Short Term Goal 4 - Progress (Week 2): Progressing toward goal Week 3:  OT Short Term Goal 1 (Week 3): Patient will complete 2/3 steps of toileting with min A for advancing past hips. OT Short Term Goal 2 (Week 3): Patient will complete grooming task with no more than 2 cues for task initiation/termination. OT Short Term Goal 3 (Week 3): Patient will complete LBD at no more than min A. OT Short Term Goal 4 (Week 3): Patient will complete UB/LB dressing tasks with mod cues to attend to task.  Skilled Therapeutic Interventions/Progress Updates:    Pt received supine in bed with family present, no pain reported, and agreeable to OT session. Pt's sugar tong splint was found doffed on table across room. Sup>sit close spvsn. Sit<>stand CGA for balance. Donned nonskid socks min A. Pt ambulated to bathroom ~15' with [facility] - occasionally not using correctly and just holding in air. Pt showered seated on BSC with occasional CGA; mod vc's to maintain NWB precations on RUE and initiation of bathing other body parts. Pt continent of bladder in shower (pt asked to urinate while seated in shower). Pt ambulated ~15' to room to gather clothing with mod vc's and get dressed. UB dressing standing CGA. LB dressing seated and in stance CGA-min A. Sugar tong splint reapplied. Pt completed grooming tasks seated at sink with min vc's for task termination. Pt ambulated with SBQC to recliner, left seated with alarm set, call bell in reach, and all needs met.   Therapy Documentation Precautions:  Precautions Precautions: Fall Precaution Comments: RUE sling and sugar tong splint Required Braces or Orthoses: Sling Restrictions Weight Bearing Restrictions: Yes RUE Weight Bearing: Non weight bearing Pain: Pain Assessment Pain Scale: 0-10 Pain Score: 0-No pain  Therapy/Group: Individual Therapy  Mellissa Kohut 08/17/2020, 4:13 PM

## 2020-08-17 NOTE — Progress Notes (Signed)
Lower extremity venous LT study completed.   Please see CV Proc for preliminary results.   Chaka Boyson, RDMS, RVT  

## 2020-08-17 NOTE — Progress Notes (Signed)
Patient agitated and combative with staff. Observed exiting the bed multiple times, unable to redirect. Refused to take HS medications despite multiple attempts. Patient removed soft cast and verbalized using it as a weapon. Marissa Nestle PA notified. One time order for ativan see mar.

## 2020-08-17 NOTE — Progress Notes (Signed)
[place] PHYSICAL MEDICINE & REHABILITATION PROGRESS NOTE  Subjective/Complaints: Patient seen sitting up in bed this AM.  She states she slept well overnight.  No reported issues. Slept well per sleep chart.  Splint not on, discussed with therapies.  Discussed perseveration with therapies as well.    ROS: Limited by cognition.   Objective: Vital Signs: Blood pressure 138/79, pulse 84, temperature 98.6 F (37 C), temperature source Oral, resp. rate 18, SpO2 100 %. No results found. No results for input(s): WBC, HGB, HCT, PLT in the last 72 hours. No results for input(s): NA, K, CL, CO2, GLUCOSE, BUN, CREATININE, CALCIUM in the last 72 hours.  Intake/Output Summary (Last 24 hours) at 08/17/2020 1047 Last data filed at 08/17/2020 0900 Gross per 24 hour  Intake 140 ml  Output --  Net 140 ml        Physical Exam: BP 138/79   Pulse 84   Temp 98.6 F (37 C) (Oral)   Resp 18   SpO2 100%   Constitutional: No distress . Vital signs reviewed. HENT: Normocephalic.  Atraumatic. Eyes: EOMI. No discharge. Cardiovascular: No JVD.  RRR. Respiratory: Normal effort.  No stridor.  Bilateral clear to auscultation. GI: Non-distended.  BS +. Skin: Warm and dry.   RUE splint CDI Psych: Distracted, perseverative, confabulates.  Musc: No edema in extremities.  No tenderness in extremities. Neuro: Alert and oriented x2 (reviewed with therapies immediately prior) Makes eye contact with examiner, stable.  Limited insight and awareness.  Motor: LUE/LLE: 4-4+/5 proximal distal, appears unchanged RUE: Shoulder abduction 4/5, distally limited by splint, appears unchanged RLE: 4/5 proximal to distal, appears stable  Assessment/Plan: 1. Functional deficits which require 3+ hours per day of interdisciplinary therapy in a comprehensive inpatient rehab setting.  Physiatrist is providing close team supervision and 24 hour management of active medical problems listed below.  Physiatrist and rehab  team continue to assess barriers to discharge/monitor patient progress toward functional and medical goals   Care Tool:  Bathing    Body parts bathed by patient: Right arm,Chest,Abdomen,Front perineal area,Buttocks,Right upper leg,Left upper leg,Left lower leg,Face,Left arm   Body parts bathed by helper: Right lower leg     Bathing assist Assist Level: Minimal Assistance - Patient > 75%     Upper Body Dressing/Undressing Upper body dressing   What is the patient wearing?: Pull over shirt    Upper body assist Assist Level: Minimal Assistance - Patient > 75%    Lower Body Dressing/Undressing Lower body dressing      What is the patient wearing?: Pants,Incontinence brief     Lower body assist Assist for lower body dressing: Moderate Assistance - Patient 50 - 74%     Toileting Toileting    Toileting assist Assist for toileting: Moderate Assistance - Patient 50 - 74%     Transfers Chair/bed transfer  Transfers assist     Chair/bed transfer assist level: Minimal Assistance - Patient > 75%     Locomotion Ambulation   Ambulation assist   Ambulation activity did not occur: Safety/medical concerns  Assist level: Minimal Assistance - Patient > 75% Assistive device: Cane-quad Max distance: 100'   Walk 10 feet activity   Assist  Walk 10 feet activity did not occur: Safety/medical concerns  Assist level: Minimal Assistance - Patient > 75% Assistive device: Cane-quad   Walk 50 feet activity   Assist Walk 50 feet with 2 turns activity did not occur: Safety/medical concerns  Assist level: Minimal Assistance - Patient > 75%  Walk 150 feet activity   Assist Walk 150 feet activity did not occur: Safety/medical concerns         Walk 10 feet on uneven surface  activity   Assist Walk 10 feet on uneven surfaces activity did not occur: Safety/medical concerns   Assist level: Moderate Assistance - Patient - 50 - 74% Assistive device: Automotive engineer Will patient use wheelchair at discharge?: No   Wheelchair activity did not occur: Safety/medical concerns         Wheelchair 50 feet with 2 turns activity    Assist    Wheelchair 50 feet with 2 turns activity did not occur: Safety/medical concerns       Wheelchair 150 feet activity     Assist  Wheelchair 150 feet activity did not occur: Safety/medical concerns        Medical Problem List and Plan: 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 Completed 7-day course of Keppra for seizure prophylaxis. Follow-up Dr. Conchita Paris  Continue CIR 2. 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 No current plans for long-term anticoagulation due to risk of fall/SDH.   Repeat vascular study reviewed, appears unchanged -antiplatelet therapy: Continue Lovenox 30 mg every 12 hours 3. Pain Management:Continue Naprosyn 250 mg twice daily, Ultram as needed  Appears controlled on 5/13 4. Mood:Provide emotional support -antipsychotic agents: Seroquel 12.5 mg qhs increased to 25 on 5/10 5. Neuropsych: This patientis notcapable of making decisions on herown behalf.  Telesitter for safety 6. Skin/Wound Care:Routine skin checks 7. Fluids/Electrolytes/Nutrition:Routine in and outs  CMP ordered for Monday 8. Right maxillary sinus, right lateral orbital wall and floor fractures with fat herniation. Follow-up outpatient Dr Leta Baptist. No surgical intervention planned 9. 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 Follow-up as outpatient. 10. Hypertension. Continue Norvasc 5 mg daily.  Controlled on 5/13  Monitor his increase mobility 11. Incidental finding of abnormal appearing endometrium identified on MRI lumbar spine. Follow-up outpatient OB/GYN 12. Right distal comminuted distal radius fracture and minimally displaced radial head fracture. No surgical  intervention at this time follow-up outpatient Dr. Merlyn Lot.  -Nonweightbearing with splintRUE. -encourage pt to keep splint in place 13.  Acute blood loss anemia  Hemoglobin 10.2 on 5/9  Continue to monitor 14. Sleep disturbance  Seroquel ordered  Sleep chart ordered  Improving  LOS: 15 days A FACE TO FACE EVALUATION WAS PERFORMED  Autym Siess Karis Juba 08/17/2020, 10:47 AM

## 2020-08-17 NOTE — Progress Notes (Signed)
Subjective: States right wrist is doing much better.  Currently working with therapy.  Feeling better overall.   Objective: Vital signs in last 24 hours: Temp:  [98.6 F (37 C)] 98.6 F (37 C) (05/12 1917) Pulse Rate:  [84] 84 (05/13 0649) Resp:  [18] 18 (05/12 1917) BP: (124-142)/(66-94) 138/79 (05/13 0847) SpO2:  [100 %] 100 % (05/13 0649)  Intake/Output from previous day: 05/12 0701 - 05/13 0700 In: 170 [P.O.:170] Out: -  Intake/Output this shift: Total I/O In: 90 [P.O.:90] Out: -   No results for input(s): HGB in the last 72 hours. No results for input(s): WBC, RBC, HCT, PLT in the last 72 hours. No results for input(s): NA, K, CL, CO2, BUN, CREATININE, GLUCOSE, CALCIUM in the last 72 hours. No results for input(s): LABPT, INR in the last 72 hours.  Intact sensation and capillary refill all digits.  +epl/fpl/io.  Splint in good repair.  Assessment/Plan: Discussed nature of injury to right wrist and radial head.  Will ask therapy to make thermoplast splint.  New XR out of splint. Cognition improved since last visit.  Discussed non surgical vs surgical options.  Will discuss further based on XR findings and patient's needs.  May be beneficial for patient's husband to be present for discussion.   Betha Loa 08/17/2020, 2:02 PM

## 2020-08-17 NOTE — Progress Notes (Signed)
Speech Language Pathology Weekly Progress and Session Note  Patient Details  Name: Danielle Kirby MRN: [id number] Date of Birth: December 26, 1945  Beginning of progress report period: Aug 09, 2020 End of progress report period: Aug 17, 2020  Today's Date: 08/17/2020 SLP Individual Time: 0830-0930 SLP Individual Time Calculation (min): 60 min  Short Term Goals: Week 2: SLP Short Term Goal 1 (Week 2): Patient will initiate functional tasks in 75% of opportunities with Mod A verbal cues. SLP Short Term Goal 1 - Progress (Week 2): Not met SLP Short Term Goal 2 (Week 2): Patient will demonstrate sustained attention to functional tasks for 2 minutes with Max verbal cues for redirection. SLP Short Term Goal 2 - Progress (Week 2): Met SLP Short Term Goal 3 (Week 2): Patient will keep her eyes open for ~10 minute intervals with Max A multimodal cues. SLP Short Term Goal 3 - Progress (Week 2): Met SLP Short Term Goal 4 (Week 2): Patient will demonstrate orientation to place, time and orientation with Mod A multimodal cues. SLP Short Term Goal 4 - Progress (Week 2): Not met SLP Short Term Goal 5 (Week 2): Patient will maintain topic of conversation for ~3 turns with Mod A verbal cues. SLP Short Term Goal 5 - Progress (Week 2): Met    New Short Term Goals: Week 3: SLP Short Term Goal 1 (Week 3): Patient will initiate functional tasks in 50% of opportunities with Mod A verbal cues. SLP Short Term Goal 2 (Week 3): Patient will demonstrate sustained attention to functional tasks for 5-7 minutes with Max verbal cues for redirection. SLP Short Term Goal 3 (Week 3): Patient will demonstrate orientation to place, time, biographical information, diagnoses and recent medical events with Mod A multimodal cues. SLP Short Term Goal 4 (Week 3): Patient will maintain topic of conversation for ~6 turns with Mod A verbal cues. SLP Short Term Goal 5 (Week 3): Pt will understand, recall and utilize memory notebook to  increase carryover of functional information and therapeutic events with max A verbal cues  Weekly Progress Updates: Pt has made functional gains and has met 3 of 5 STGs this reporting period. 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 Patient and family education ongoing. Pt would benefit from continued skilled ST services to maximize safety and independence with daily living tasks and routines.   Intensity: Minumum of 1-2 x/day, 30 to 90 minutes Frequency: 3 to 5 out of 7 days Duration/Length of Stay: 08/30/20 Treatment/Interventions: Cognitive remediation/compensation;Internal/external aids;Therapeutic Activities;Environmental controls;Cueing hierarchy;Functional tasks;Patient/family education   Daily Session  Skilled Therapeutic Interventions: Pt seen for skilled ST with focus on cognitive goals. When SLP entered room, pt holding call button/remote in hand and pressing buttons randomly. SLP providing education on help/nursing button, TV on/off, channel and volume toggle. Pt with limited carryover of information. Pt distracted and perseverating on pictures of MDs hanging in eye line, SLP moving poster to wall beside bed to reduce distractions and encourage increased focused attention. Pt remains disoriented to fall, brain injury, being in the hospital (states she is in the office) as well as age.  Pt benefits from max A fading to mod A cues to utilize environmental aides to increase orientation. SLP facilitating sustained attention task through card sorting activity (by color, by suit, 1-10) with patient initially requiring min A  cues but eventually requiring mod A verbal cues to follow directions, likely secondary to cognitive fatigue. Pt left in bed with alarm set and all needs  within reach. Cont ST POC.      Pain Pain Assessment Pain Scale: 0-10 Pain Score: 0-No pain  Therapy/Group: Individual Therapy  Dewaine Conger 08/17/2020, 10:56 AM

## 2020-08-17 NOTE — Progress Notes (Signed)
Patient observed resting calmly in bed. Bed low, bed alarm on,and survailance  monitor in place.

## 2020-08-18 DIAGNOSIS — S069X0D Unspecified intracranial injury without loss of consciousness, subsequent encounter: Secondary | ICD-10-CM

## 2020-08-18 NOTE — Progress Notes (Signed)
Physical Therapy TBI Note  Patient Details  Name: Danielle Kirby MRN: [id number] Date of Birth: 1945/07/09  Today's Date: 08/18/2020 PT Individual Time: 1120-1205 PT Individual Time Calculation (min): 45 min   Short Term Goals:  Week 2:  PT Short Term Goal 1 (Week 2): Patient will ambulate 100' with LRAD and min A. PT Short Term Goal 2 (Week 2): Patient will demonstrate standing for 3 minutes for functional task with min cues for sustained attention to task. PT Short Term Goal 3 (Week 2): Patient will negotiate 4 (6') steps with min A with rails. PT Short Term Goal 4 (Week 2): Patient will demonstrate functional transfers with CGA.     Skilled Therapeutic Interventions/Progress Updates:  Pt resting in bed. She denied pain.  Pt moved into sitting EOB impulsively.  She donned non slip socks independently.  PT donned RUE splint. Sit> stand to QC with CGA.  Gait training with QC x 25' with min assist for balance.  Pt's gait is hesitant and she has difficulty sequencing steps with QC.  Pt offered pt R push handle of her w/c; pt and PT pushed w/c, x 125' with improved gait.  Up/down 12 steps L rail, with pt complying in not using her RUE on railing, holding it at waist level.  Pt self selected step through method, CGA.  neuromuscular re-education via demo, multimodal cues, in standing with LUE support : bil heel raises, bil toe raises (limited excursion LLE), mini squats, R/L hip abduction focusing on neutral hip rotation.  In sitting in w/c: alternating L/R long arc quad knee extensions with ankle pumps at end range.  Gait without AD to return to room, with PT providing support L hand PRN, min assist for unsteadiness, but much improved with cues for increased velocity and longer step lengths.  Transfer recliner> bed to R with cues to avoid using R hand, min assist.  At end of session, pt resting in bed with waist restraint on, 4 rails raised, and bed alarm set.  NT in room.     Therapy  Documentation Precautions:  Precautions Precautions: Fall Precaution Comments: RUE sling and sugar tong splint Required Braces or Orthoses: Sling Restrictions Weight Bearing Restrictions: Yes RUE Weight Bearing: Non weight bearing  Agitated Behavior Scale: TBI Observation Details Observation Environment: therapy room Start of observation period - Date: 08/18/20 Start of observation period - Time: 1120 End of observation period - Date: 08/18/20 End of observation period - Time: 1205 Agitated Behavior Scale (DO NOT LEAVE BLANKS) Short attention span, easy distractibility, inability to concentrate: Present to a slight degree Impulsive, impatient, low tolerance for pain or frustration: Present to a slight degree Uncooperative, resistant to care, demanding: Absent Violent and/or threatening violence toward people or property: Absent Explosive and/or unpredictable anger: Absent Rocking, rubbing, moaning, or other self-stimulating behavior: Absent Pulling at tubes, restraints, etc.: Absent Wandering from treatment areas: Absent Restlessness, pacing, excessive movement: Absent Repetitive behaviors, motor, and/or verbal: Absent Rapid, loud, or excessive talking: Absent Sudden changes of mood: Absent Easily initiated or excessive crying and/or laughter: Absent Self-abusiveness, physical and/or verbal: Absent Agitated behavior scale total score: 16         Therapy/Group: Individual Therapy  Oliveah Zwack 08/18/2020, 12:30 PM

## 2020-08-18 NOTE — Progress Notes (Signed)
Patient very anxious, restlless and getting out out chair, says she has to go home, reufuses to take meds. RN called significant other listed on MAR who spoke to patient and calm her down to where she was receptive to care and took meds

## 2020-08-18 NOTE — Plan of Care (Signed)
  Problem: RH SAFETY Goal: RH STG ADHERE TO SAFETY PRECAUTIONS W/ASSISTANCE/DEVICE Description: STG Adhere to Safety Precautions With cues/reminders  Assistance/Device. Outcome: Not Progressing; telesitter; sundowning

## 2020-08-18 NOTE — Progress Notes (Signed)
Physical Therapy TBI Note  Patient Details  Name: Danielle Kirby MRN: [id number] Date of Birth: Mar 30, 1946  Today's Date: 08/18/2020 PT Individual Time:  6629-4765 PT Individual Time Calculation (min): 74 min    Short Term Goals: Week 1:  PT Short Term Goal 1 (Week 1): Patient to demonstrate sustained attention to mobility tasks with mod cues 30% of the time. PT Short Term Goal 1 - Progress (Week 1): Met PT Short Term Goal 2 (Week 1): Patient to perform supine to sit with mod A with cues and increased time. PT Short Term Goal 2 - Progress (Week 1): Met PT Short Term Goal 3 (Week 1): Patient to perform bed/chair transfers with mod A within 5 minutes of initial cue. PT Short Term Goal 3 - Progress (Week 1): Met PT Short Term Goal 4 (Week 1): Patient to initate standing activities with +2 A with LRAD. PT Short Term Goal 4 - Progress (Week 1): Met Week 2:  PT Short Term Goal 1 (Week 2): Patient will ambulate 100' with LRAD and min A. PT Short Term Goal 2 (Week 2): Patient will demonstrate standing for 3 minutes for functional task with min cues for sustained attention to task. PT Short Term Goal 3 (Week 2): Patient will negotiate 4 (6') steps with min A with rails. PT Short Term Goal 4 (Week 2): Patient will demonstrate functional transfers with CGA.  Skilled Therapeutic Interventions/Progress Updates:  Patient seated in recliner on entrance to room with husband and son present. Patient alert and agreeable to PT session. Patient denied pain during session. However throughts initially perseverate on tidying room and packing belongings to pack away in car. For second half of session, pt perseverates on need to find car keys.   Therapeutic Activity: Transfers: Patient performed STS and SPVT transfers Min A using LUE only to push from armrests. Provided vc/ tc throughout session for safe LUE progression and maintaining RUE NWB.  Gait Training:  Patient ambulated 165' x1/ 127' x1/ 150' x1 with no  Ad and CGA/ Min A for balance. Demonstrated bias toward R side and tending to reach out for R handrail or nurses' desk to steady self on R side.  Provided vc/ tc for maintaining level gaze as pt tends to stare down at feet, upright posture, maintaining NWB to RUE, consistent stepthrough pattern.  Neuromuscular Re-ed: NMR facilitated during session with focus on attention to task with dual task ambulation activity. Pt guided in collection of cones throughout therapy gym placed at differing heights and depths on surfaces as well as requiring some safety awareness and problem solving ability for safe approach and retrieval. Pt easily distracts herself from task with consistent perseverative thoughts re: her car keys and where her car is located outside. Requires consistent vc for redirection back to task and when asked is able to relate that she is to be looking for cones. Finds 4/ 8 without assistance. Reaches to retrieve 2 cones with RUE and reminded to use LUE only during this task. VC throughout for safe techniques in approach.   NMR performed for improvements in motor control and coordination, balance, sequencing, judgement, and self confidence/ efficacy in performing all aspects of mobility at highest level of independence.   Patient seated in recliner at end of session with brakes locked, seat alarm set, and all needs within reach. Nsg notified re: thought perseverations re: pt's keys and that they are home.      Therapy Documentation Precautions:  Precautions Precautions: Fall Precaution  Comments: RUE sling and sugar tong splint Required Braces or Orthoses: Sling Restrictions Weight Bearing Restrictions: Yes RUE Weight Bearing: Non weight bearing  Agitated Behavior Scale: TBI Agitated Behavior Scale   Observation Environment: pt's room Period of Observation   Treatment session At the end of the observation period indicate whether the behavior described in each item was present and, if  so, to what degree: slight, moderate or extreme. Use the following numerical value and criteria for your rating.  1=absent: the behavior is not observed. 2=present to a slight degree: the behavior is observed but does not prevent the conduct of other, contextually appropriate behavior.  (The individual may redirect spontaneously, or the continuation of the agitated behavior does not disrupt appropriate behavior.)  3=present to a moderate degree: the individual needs to be redirected from an agitated to an appropriate behavior, but benefits from such cueing. 4=present to an extreme degree: the individual is not able to engage in appropriate behavior due to interference of the agitated behavior, even when external cueing or redirection is provided.  DO NOT LEAVE BLANKS  2_______1.  Short attention span, easy distractibility, inability to concentrate. 1_______2. Impulsive, impatient, low tolerance for pain or frustration. 1_______3. Uncooperative, resistant to care, demanding. 1_______4 Violent and/or threatening violence towards people or property. 1_______5. Explosive and/or unpredictable anger. 1_______6. Rocking, rubbing, moaning or other self-stimulating behavior. 1_______7. Pulling at tubes, restraints, etc. 1_______8. Wandering from treatment areas.  3_______9. Restlessness, pacing, excessive movement. 3______10. Repetitive behaviors, motor and/or verbal. 1______11. Rapid, loud or excessive talking. 1______12. Sudden changes of mood.  1______13. Easily initiated or excessive crying and/or laughter. 1______14. Self-abusiveness, physical and/or verbal.   Score: 21     Therapy/Group: Individual Therapy  Alger Simons PT, DPT 08/17/2020, 4:50 PM

## 2020-08-18 NOTE — Progress Notes (Signed)
[facility] PHYSICAL MEDICINE & REHABILITATION PROGRESS NOTE  Subjective/Complaints: Patient seen sitting up in bed this AM.  She states she slept well overnight.  No reported issues. Slept well per sleep chart.  Splint not on, discussed with therapies.  Discussed perseveration with therapies as well.    ROS: Limited by cognition.   Objective: Vital Signs: Blood pressure 127/84, pulse 75, temperature 98.4 F (36.9 C), resp. rate 18, SpO2 100 %. VAS [place] LOWER EXTREMITY VENOUS (DVT)  Result Date: 08/18/2020  Lower Venous DVT Study Patient Name:  Danielle Kirby  Date of Exam:   08/17/2020 Medical Rec #: [id number]        Accession #:    [id number] Date of Birth: Aug 31, 1945         Patient Gender: F Patient Age:   48Y Exam Location:  [facility] Procedure:      VAS [place] LOWER EXTREMITY VENOUS (DVT) Referring Phys: [id number] ANKIT ANIL PATEL --------------------------------------------------------------------------------  Indications: Follow up LT peroneal DVT.  Anticoagulation: Contraindicated. Comparison Study: 08-04-2020 Most recent bilateral lower extremity venous showed                   partial, age indeterminate DVT involving the left peroneal                   veins. Performing Technologist: Jean Rosenthal RDMS,RVT  Examination Guidelines: A complete evaluation includes B-mode imaging, spectral Doppler, color Doppler, and power Doppler as needed of all accessible portions of each vessel. Bilateral testing is considered an integral part of a complete examination. Limited examinations for reoccurring indications may be performed as noted. The reflux portion of the exam is performed with the patient in reverse Trendelenburg.  +-----+---------------+---------+-----------+----------+--------------+ RIGHTCompressibilityPhasicitySpontaneityPropertiesThrombus Aging +-----+---------------+---------+-----------+----------+--------------+ CFV  Full           Yes      Yes                                  +-----+---------------+---------+-----------+----------+--------------+   +---------+---------------+---------+-----------+----------+-----------------+ LEFT     CompressibilityPhasicitySpontaneityPropertiesThrombus Aging    +---------+---------------+---------+-----------+----------+-----------------+ CFV      Full           Yes      Yes                                    +---------+---------------+---------+-----------+----------+-----------------+ SFJ      Full                                                           +---------+---------------+---------+-----------+----------+-----------------+ FV Prox  Full                                                           +---------+---------------+---------+-----------+----------+-----------------+ FV Mid   Full                                                           +---------+---------------+---------+-----------+----------+-----------------+  FV DistalFull                                                           +---------+---------------+---------+-----------+----------+-----------------+ PFV      Full                                                           +---------+---------------+---------+-----------+----------+-----------------+ POP      Full           Yes      Yes                                    +---------+---------------+---------+-----------+----------+-----------------+ PTV      Full                                                           +---------+---------------+---------+-----------+----------+-----------------+ PERO     Partial        Yes      Yes                  Age Indeterminate +---------+---------------+---------+-----------+----------+-----------------+    Summary: RIGHT: - No evidence of common femoral vein obstruction.  LEFT: - Findings consistent with age indeterminate deep vein thrombosis involving the left peroneal veins. - Findings appear essentially  unchanged compared to previous examination. - A cystic structure is found in the popliteal fossa.  *See table(s) above for measurements and observations. Electronically signed by Lemar LivingsBrandon Cain MD on 08/18/2020 at 11:06:52 AM.    Final    No results for input(s): WBC, HGB, HCT, PLT in the last 72 hours. No results for input(s): NA, K, CL, CO2, GLUCOSE, BUN, CREATININE, CALCIUM in the last 72 hours.  Intake/Output Summary (Last 24 hours) at 08/18/2020 1354 Last data filed at 08/18/2020 0905 Gross per 24 hour  Intake 360 ml  Output --  Net 360 ml        Physical Exam: BP 127/84   Pulse 75   Temp 98.4 F (36.9 C)   Resp 18   SpO2 100%    General: No acute distress Mood and affect are appropriate Heart: Regular rate and rhythm no rubs murmurs or extra sounds Lungs: Clear to auscultation, breathing unlabored, no rales or wheezes Abdomen: Positive bowel sounds, soft nontender to palpation, nondistended Extremities: No clubbing, cyanosis, or edema Skin: No evidence of breakdown, no evidence of rash Neurologic: Cranial nerves II through XII intact, motor strength is 5/5 in bilateral deltoid, bicep, tricep, grip, hip flexor, knee extensors, ankle dorsiflexor and plantar flexor Sensory exam normal sensation to light touch and proprioception in bilateral upper and lower extremities Cerebellar exam normal finger to nose to finger as well as heel to shin in bilateral upper and lower extremities Musculoskeletal: Full range of motion in all 4 extremities. No joint swelling   Neuro: Alert and oriented x2 (reviewed with therapies immediately prior),  disoriented to time Makes eye contact with examiner, stable.  Limited insight and awareness.  Motor: LUE/LLE: 4-4+/5 proximal distal, appears unchanged RUE: Shoulder abduction 4/5, distally limited by splint, appears unchanged RLE: 4/5 proximal to distal, appears stable  Assessment/Plan: 1. Functional deficits which require 3+ hours per day of  interdisciplinary therapy in a comprehensive inpatient rehab setting.  Physiatrist is providing close team supervision and 24 hour management of active medical problems listed below.  Physiatrist and rehab team continue to assess barriers to discharge/monitor patient progress toward functional and medical goals   Care Tool:  Bathing    Body parts bathed by patient: Right arm,Chest,Abdomen,Front perineal area,Buttocks,Right upper leg,Left upper leg,Left lower leg,Face,Left arm,Right lower leg   Body parts bathed by helper: Right lower leg     Bathing assist Assist Level: Supervision/Verbal cueing     Upper Body Dressing/Undressing Upper body dressing   What is the patient wearing?: Pull over shirt    Upper body assist Assist Level: Contact Guard/Touching assist    Lower Body Dressing/Undressing Lower body dressing      What is the patient wearing?: Pants,Incontinence brief     Lower body assist Assist for lower body dressing: Minimal Assistance - Patient > 75%     Toileting Toileting    Toileting assist Assist for toileting: Moderate Assistance - Patient 50 - 74%     Transfers Chair/bed transfer  Transfers assist     Chair/bed transfer assist level: Minimal Assistance - Patient > 75%     Locomotion Ambulation   Ambulation assist   Ambulation activity did not occur: Safety/medical concerns  Assist level: Minimal Assistance - Patient > 75% Assistive device: Cane-quad Max distance: 25   Walk 10 feet activity   Assist  Walk 10 feet activity did not occur: Safety/medical concerns  Assist level: Minimal Assistance - Patient > 75% Assistive device: Cane-quad   Walk 50 feet activity   Assist Walk 50 feet with 2 turns activity did not occur: Safety/medical concerns  Assist level: Minimal Assistance - Patient > 75%      Walk 150 feet activity   Assist Walk 150 feet activity did not occur: Safety/medical concerns         Walk 10 feet on  uneven surface  activity   Assist Walk 10 feet on uneven surfaces activity did not occur: Safety/medical concerns   Assist level: Moderate Assistance - Patient - 50 - 74% Assistive device: Youth worker Will patient use wheelchair at discharge?: No   Wheelchair activity did not occur: Safety/medical concerns         Wheelchair 50 feet with 2 turns activity    Assist    Wheelchair 50 feet with 2 turns activity did not occur: Safety/medical concerns       Wheelchair 150 feet activity     Assist  Wheelchair 150 feet activity did not occur: Safety/medical concerns        Medical Problem List and Plan: 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 Completed 7-day course of Keppra for seizure prophylaxis. Follow-up Dr. Conchita Paris  Continue CIR, PT OT 2. 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 No current plans for long-term anticoagulation due to risk of fall/SDH.   Repeat vascular study reviewed, appears unchanged, left calf DVT no proximal migration -antiplatelet therapy: Continue Lovenox 30 mg every 12 hours 3. Pain Management:Continue Naprosyn 250 mg twice daily, Ultram as needed  Appears controlled on 5/14 4. Mood:Provide emotional support -  antipsychotic agents: Seroquel 12.5 mg qhs increased to 25 on 5/10 5. Neuropsych: This patientis notcapable of making decisions on herown behalf.  Telesitter for safety 6. Skin/Wound Care:Routine skin checks 7. Fluids/Electrolytes/Nutrition:Routine in and outs  CMP ordered for Monday 8. Right maxillary sinus, right lateral orbital wall and floor fractures with fat herniation. Follow-up outpatient Dr Leta Baptist. No surgical intervention planned 9. 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 Follow-up as outpatient. 10. Hypertension. Continue Norvasc 5 mg daily.  Controlled on 5/13 Vitals:   08/17/20  1921 08/18/20 0639  BP: 128/90 127/84  Pulse: 92 75  Resp: 18   Temp: 98.4 F (36.9 C)   SpO2: 100%    11. Incidental finding of abnormal appearing endometrium identified on MRI lumbar spine. Follow-up outpatient OB/GYN 12. Right distal comminuted distal radius fracture and minimally displaced radial head fracture. No surgical intervention at this time follow-up outpatient Dr. Merlyn Lot.  -Nonweightbearing with splintRUE. -encourage pt to keep splint in place 13.  Acute blood loss anemia  Hemoglobin 10.2 on 5/9  Continue to monitor 14. Sleep disturbance  Seroquel ordered  Sleep chart ordered  Improving  LOS: 16 days A FACE TO FACE EVALUATION WAS PERFORMED  Erick Colace 08/18/2020, 1:54 PM

## 2020-08-18 NOTE — Progress Notes (Signed)
Speech Language Pathology Daily Session Note  Patient Details  Name: SAUSHA RAYMOND MRN: [id number] Date of Birth: [birth year]  Today's Date: 08/18/2020 SLP Individual Time: 5681-2751 SLP Individual Time Calculation (min): 45 min  Short Term Goals: Week 3: SLP Short Term Goal 1 (Week 3): Patient will initiate functional tasks in 50% of opportunities with Mod A verbal cues. SLP Short Term Goal 2 (Week 3): Patient will demonstrate sustained attention to functional tasks for 5-7 minutes with Max verbal cues for redirection. SLP Short Term Goal 3 (Week 3): Patient will demonstrate orientation to place, time, biographical information, diagnoses and recent medical events with Mod A multimodal cues. SLP Short Term Goal 4 (Week 3): Patient will maintain topic of conversation for ~6 turns with Mod A verbal cues. SLP Short Term Goal 5 (Week 3): Pt will understand, recall and utilize memory notebook to increase carryover of functional information and therapeutic events with max A verbal cues  Skilled Therapeutic Interventions:Skilled ST services focused on cognitive skills. Pt was initially disorientated to place and time, but with mod A verbal cues to utilize visual aids in recall was able to orientate self. Pt demonstrated intermittent disorientation, referring to family and children being present at hospital, but was easily redirected. Pt was able to recall place mod I and time with supervision A verbal cues for calendar with 5-10 minute delayed. SLP set up breakfast tray, pt able to self-feed and demonstrate functional sustained attention during task, with self-directed predoic breaks to discuss family.  SLP initiated memory notebook again, was unable to locate in patient's room. Pt required mod A verbal cues to recall events with current therapist and required min A verbal cues to utilize memory notebook to recall upcoming therapy appointments and min A verbal cues to correct reading errors "online" verse  name "caroline" , name of upcoming therapist up to a 5 minute delay. Pt was left in room with call bell within reach and bed alarm set. SLP recommends to continue skilled services.     Pain Pain Assessment Pain Score: 0-No pain Faces Pain Scale: No hurt  Therapy/Group: Individual Therapy  Jemery Stacey  [facility] 08/18/2020, 12:36 PM

## 2020-08-19 NOTE — Progress Notes (Signed)
Patient is calm but refused to take morning meds; offered 3x but she keeps refusing.

## 2020-08-19 NOTE — Plan of Care (Signed)
  Problem: RH SAFETY Goal: RH STG ADHERE TO SAFETY PRECAUTIONS W/ASSISTANCE/DEVICE Description: STG Adhere to Safety Precautions With cues/reminders  Assistance/Device. Outcome: Not Progressing; telesitter   Problem: RH KNOWLEDGE DEFICIT Goal: RH STG INCREASE KNOWLEDGE OF HYPERTENSION Description: Patient and significant other will be able to manage HTN with medications and dietary modifications using handouts and educational tools independently Outcome: Not Progressing; confused

## 2020-08-19 NOTE — Progress Notes (Signed)
Offered medications patient refused ; visitor in room claims patient does not take meds even at home.

## 2020-08-20 LAB — COMPREHENSIVE METABOLIC PANEL
ALT: 25 U/L (ref 0–44)
AST: 26 U/L (ref 15–41)
Albumin: 3.3 g/dL — ABNORMAL LOW (ref 3.5–5.0)
Alkaline Phosphatase: 103 U/L (ref 38–126)
Anion gap: 7 (ref 5–15)
BUN: 9 mg/dL (ref 8–23)
CO2: 27 mmol/L (ref 22–32)
Calcium: 10 mg/dL (ref 8.9–10.3)
Chloride: 107 mmol/L (ref 98–111)
Creatinine, Ser: 0.73 mg/dL (ref 0.44–1.00)
GFR, Estimated: >60 mL/min (ref >60)
Glucose, Bld: 114 mg/dL — ABNORMAL HIGH (ref 70–99)
Potassium: 4 mmol/L (ref 3.5–5.1)
Sodium: 141 mmol/L (ref 135–145)
Total Bilirubin: 0.4 mg/dL (ref 0.3–1.2)
Total Protein: 6.9 g/dL (ref 6.5–8.1)

## 2020-08-20 NOTE — Progress Notes (Signed)
Physical Therapy Weekly Progress Note  Patient Details  Name: Danielle Kirby MRN: [id number] Date of Birth: October 06, 1945  Beginning of progress report period: Aug 10, 2020 End of progress report period: Aug 20, 2020  Today's Date: 08/20/2020 PT Individual Time: 1105-1205 PT Individual Time Calculation (min): 60 min   Patient has met 4 of 4 short term goals.  Patient making excellent progress towards all mobility goals though still needing work on balance, gait fluency, initiation and safety.  Mainly limited with cognition as noted below.    Patient continues to demonstrate the following deficits muscle weakness, decreased initiation, decreased attention, decreased problem solving, decreased safety awareness and decreased memory and decreased standing balance, decreased balance strategies and difficulty maintaining precautions and therefore will continue to benefit from skilled PT intervention to increase functional independence with mobility.  Patient progressing toward long term goals..  Continue plan of care.  PT Short Term Goals Week 2:  PT Short Term Goal 1 (Week 2): Patient will ambulate 100' with LRAD and min A. PT Short Term Goal 1 - Progress (Week 2): Met PT Short Term Goal 2 (Week 2): Patient will demonstrate standing for 3 minutes for functional task with min cues for sustained attention to task. PT Short Term Goal 2 - Progress (Week 2): Met PT Short Term Goal 3 (Week 2): Patient will negotiate 4 (6') steps with min A with rails. PT Short Term Goal 3 - Progress (Week 2): Met PT Short Term Goal 4 (Week 2): Patient will demonstrate functional transfers with CGA. PT Short Term Goal 4 - Progress (Week 2): Met Week 3:  PT Short Term Goal 1 (Week 3): Patient will perform standing balance dynamic activity with S without UE support. PT Short Term Goal 2 (Week 3): Patient will ambulate 60' with S with LRAD PT Short Term Goal 3 (Week 3): Patient will negotiate 4 steps with rail and S. PT  Short Term Goal 4 (Week 3): Patient will demonstrate session to session recall with mod multimodal cues.  Skilled Therapeutic Interventions/Progress Updates:  Patient up in w/c in room.  Propelled w/c with feet, then with L UE and feet after cues to dayroom x 120.  Patient ambulated x 150' with QC and CGA performing sit to stand with S to CGA.  Patient ambulated through obstacle course stepping over hurdles with QC and CGA to min A, then standing for 1 minute without UE support on Airex with close S, then second bout standing on Airex 1 minute while performing head turns.  Patient performed standing balance on Airex while performing head turns to read numbers on colored discs with CGA to close S.  Patient during seated rest attempting to read newspaper to give info about photo, but reported unable to read due to glasses not letting her.  She performed peg design with mod multimodal cues and min A.  Patient propelled in w/c back to room with S and cues.  Patient left in w/c with alarm belt active and needs/call bell in reach.  Ambulation/gait training;DME/adaptive equipment instruction;Neuromuscular re-education;Stair training;UE/LE Strength taining/ROM;Wheelchair propulsion/positioning;UE/LE Coordination activities;Therapeutic Activities;Discharge planning;Balance/vestibular training;Cognitive remediation/compensation;Disease management/prevention;Functional mobility training;Patient/family education;Therapeutic Exercise;Splinting/orthotics;Visual/perceptual remediation/compensation   Therapy Documentation Precautions:  Precautions Precautions: Fall Precaution Comments: RUE sling and sugar tong splint Required Braces or Orthoses: Sling Restrictions Weight Bearing Restrictions: Yes RUE Weight Bearing: Non weight bearing Pain: Pain Assessment Pain Scale: 0-10 Pain Score: 0-No pain   Therapy/Group: Individual Therapy  Reginia Naas  Magda Kiel, PT 08/20/2020, 11:37 AM

## 2020-08-20 NOTE — Progress Notes (Signed)
Occupational Therapy TBI Note  Patient Details  Name: Danielle Kirby MRN: [id number] Date of Birth: 06-04-1945  Today's Date: 08/20/2020 OT Individual Time: 1000-1103 OT Individual Time Calculation (min): 63 min   Short Term Goals: Week 2:  OT Short Term Goal 1 (Week 2): Patient will complete toilet transfer min A. OT Short Term Goal 1 - Progress (Week 2): Met OT Short Term Goal 2 (Week 2): Patient will complete LB dressing mod A. OT Short Term Goal 2 - Progress (Week 2): Met OT Short Term Goal 3 (Week 2): Patient will complete grooming task with 2 or less vc's for initiation/termination. OT Short Term Goal 3 - Progress (Week 2): Progressing toward goal OT Short Term Goal 4 (Week 2): Patient will complete 2/3 steps of toileting with min A for advancing pants past hips. OT Short Term Goal 4 - Progress (Week 2): Progressing toward goal  Skilled Therapeutic Interventions/Progress Updates:    Pt received supine in bed with staff and family present receiving a blood draw, agreeable to OT session. Pt reports 2/10 pain in RUE with split doffed sitting on windowsill. Pt Ox2. Sup>sit CGA for balance. Sugar tong splint donned sitting EOB. Sit<>stand CGA throughout session with significant anterior leaning when beginning to stand. Pt ambulated ~25' to bathroom CGA. Doffed clothing min A seated and in stance at shower chair. Pt engaged in shower - seated and in stance - with min A to wash LUE; pt perseverating on washing certain body parts requiring vc's to redirect. PICC line covered in flexi-seal and splint covered. Pt ambulated back to dresser CGA while wearing towels to pick out clothes. Pt successfully picked out needed clothing with min vc's. UB and LB dressing (including socks/shoes) min A seated and in stance with multimodal cues for sequencing and initiation. While seated in w/c at sink, pt completed oral care and washed face with set up; required min vc's for task termination. In stance, pt engaged  in stretching activities for BUEs and trunk. Pt reported feeling "wiped out" following session. Pt left in w/c with alarm set, call bell in reach, and all needs met ready for following PT session.   Therapy Documentation Precautions:  Precautions Precautions: Fall Precaution Comments: RUE sling and sugar tong splint Required Braces or Orthoses: Sling Restrictions Weight Bearing Restrictions: Yes RUE Weight Bearing: Non weight bearing Pain: Pain Assessment Pain Scale: 0-10 Pain Score: 2  Agitated Behavior Scale: TBI Observation Details Observation Environment: pt room Start of observation period - Date: 08/20/20 Start of observation period - Time: 1000 End of observation period - Date: 08/20/20 End of observation period - Time: 1103 Agitated Behavior Scale (DO NOT LEAVE BLANKS) Short attention span, easy distractibility, inability to concentrate: Present to a slight degree Impulsive, impatient, low tolerance for pain or frustration: Present to a slight degree Uncooperative, resistant to care, demanding: Absent Violent and/or threatening violence toward people or property: Absent Explosive and/or unpredictable anger: Absent Rocking, rubbing, moaning, or other self-stimulating behavior: Absent Pulling at tubes, restraints, etc.: Absent Wandering from treatment areas: Absent Restlessness, pacing, excessive movement: Absent Repetitive behaviors, motor, and/or verbal: Present to a slight degree Rapid, loud, or excessive talking: Absent Sudden changes of mood: Absent Easily initiated or excessive crying and/or laughter: Absent Self-abusiveness, physical and/or verbal: Absent Agitated behavior scale total score: 17    Therapy/Group: Individual Therapy  Mellissa Kohut 08/20/2020, 12:30 PM

## 2020-08-20 NOTE — Progress Notes (Signed)
[place] PHYSICAL MEDICINE & REHABILITATION PROGRESS NOTE  Subjective/Complaints: Patient seen sitting up in her chair this AM. Sleep chart partially completed.  Sitter at bedside.  She remains confused.   ROS: Limited by cognition.   Objective: Vital Signs: Blood pressure (!) 169/107, pulse 75, temperature 98 F (36.7 C), resp. rate 19, SpO2 100 %. No results found. No results for input(s): WBC, HGB, HCT, PLT in the last 72 hours. Recent Labs    08/20/20 0956  NA 141  K 4.0  CL 107  CO2 27  GLUCOSE 114*  BUN 9  CREATININE 0.73  CALCIUM 10.0    Intake/Output Summary (Last 24 hours) at 08/20/2020 1308 Last data filed at 08/20/2020 0800 Gross per 24 hour  Intake 440 ml  Output 0 ml  Net 440 ml        Physical Exam: BP (!) 169/107 (BP Location: Left Arm)   Pulse 75   Temp 98 F (36.7 C)   Resp 19   SpO2 100%   Constitutional: No distress . Vital signs reviewed. HENT: Normocephalic.  Atraumatic. Eyes: EOMI. No discharge. Cardiovascular: No JVD.  RRR. Respiratory: Normal effort.  No stridor.  Bilateral clear to auscultation. GI: Non-distended.  BS +. Skin: Warm and dry.  Intact. Psych: Normal mood.  Normal behavior. Musc: No edema in extremities.  No tenderness in extremities. Neuro: Alert x oriented x 1 Motor: 5/5 proximal to distal Limited insight and awareness.  Motor: LUE/LLE: 4-4+/5 proximal distal, appears unchanged RUE: Shoulder abduction 4-/5, distally limited by splint, appears unchanged RLE: 4/5 proximal to distal, appears stable  Assessment/Plan: 1. Functional deficits which require 3+ hours per day of interdisciplinary therapy in a comprehensive inpatient rehab setting.  Physiatrist is providing close team supervision and 24 hour management of active medical problems listed below.  Physiatrist and rehab team continue to assess barriers to discharge/monitor patient progress toward functional and medical goals   Care Tool:  Bathing     Body parts bathed by patient: Right arm,Chest,Abdomen,Front perineal area,Buttocks,Right upper leg,Left upper leg,Left lower leg,Face,Right lower leg   Body parts bathed by helper: Left arm     Bathing assist Assist Level: Minimal Assistance - Patient > 75%     Upper Body Dressing/Undressing Upper body dressing   What is the patient wearing?: Pull over shirt    Upper body assist Assist Level: Minimal Assistance - Patient > 75%    Lower Body Dressing/Undressing Lower body dressing      What is the patient wearing?: Pants     Lower body assist Assist for lower body dressing: Minimal Assistance - Patient > 75%     Toileting Toileting    Toileting assist Assist for toileting: Moderate Assistance - Patient 50 - 74%     Transfers Chair/bed transfer  Transfers assist     Chair/bed transfer assist level: Contact Guard/Touching assist     Locomotion Ambulation   Ambulation assist   Ambulation activity did not occur: Safety/medical concerns  Assist level: Contact Guard/Touching assist Assistive device: Cane-quad Max distance: 150'   Walk 10 feet activity   Assist  Walk 10 feet activity did not occur: Safety/medical concerns  Assist level: Contact Guard/Touching assist Assistive device: Cane-quad   Walk 50 feet activity   Assist Walk 50 feet with 2 turns activity did not occur: Safety/medical concerns  Assist level: Contact Guard/Touching assist Assistive device: Cane-quad    Walk 150 feet activity   Assist Walk 150 feet activity did not occur: Safety/medical  concerns  Assist level: Contact Guard/Touching assist Assistive device: Cane-quad    Walk 10 feet on uneven surface  activity   Assist Walk 10 feet on uneven surfaces activity did not occur: Safety/medical concerns   Assist level: Moderate Assistance - Patient - 50 - 74% Assistive device: Youth worker Will patient use wheelchair at discharge?: No Type of  Wheelchair: Manual Wheelchair activity did not occur: Safety/medical concerns  Wheelchair assist level: Supervision/Verbal cueing Max wheelchair distance: 120'    Wheelchair 50 feet with 2 turns activity    Assist    Wheelchair 50 feet with 2 turns activity did not occur: Safety/medical concerns   Assist Level: Supervision/Verbal cueing   Wheelchair 150 feet activity     Assist  Wheelchair 150 feet activity did not occur: Safety/medical concerns        Medical Problem List and Plan: 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 Completed 7-day course of Keppra for seizure prophylaxis. Follow-up Dr. Conchita Paris  Continue CIR 2. 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 No current plans for long-term anticoagulation due to risk of fall/SDH.   Repeat vascular study reviewed, appears unchanged, left calf DVT no proximal migration -antiplatelet therapy: Continue Lovenox 30 mg every 12 hours 3. Pain Management:Continue Naprosyn 250 mg twice daily, Ultram as needed  Appears controlled on 5/16 4. Mood:Provide emotional support -antipsychotic agents: Seroquel 12.5 mg qhs increased to 25 on 5/10 5. Neuropsych: This patientis notcapable of making decisions on herown behalf.  Telesitter for safety 6. Skin/Wound Care:Routine skin checks 7. Fluids/Electrolytes/Nutrition:Routine in and outs  CMP within acceptable range on 5/16 8. Right maxillary sinus, right lateral orbital wall and floor fractures with fat herniation. Follow-up outpatient Dr Leta Baptist. No surgical intervention planned 9. 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 Follow-up as outpatient. 10. Hypertension. Continue Norvasc 5 mg daily. Vitals:   08/19/20 1955 08/20/20 0456  BP: (!) 143/86 (!) 169/107  Pulse: 86 75  Resp: 17 19  Temp: 99.3 F (37.4 C) 98 F (36.7 C)  SpO2: 100% 100%   Labile on 5/16, monitor for  trend 11. Incidental finding of abnormal appearing endometrium identified on MRI lumbar spine. Follow-up outpatient OB/GYN 12. Right distal comminuted distal radius fracture and minimally displaced radial head fracture. No surgical intervention at this time follow-up outpatient Dr. Merlyn Lot.  -Nonweightbearing with splintRUE. -encourage pt to keep splint in place 13.  Acute blood loss anemia  Hemoglobin 10.2 on 5/9  Continue to monitor 14. Sleep disturbance  Seroquel ordered  Sleep chart ordered  Appears to be improving  LOS: 18 days A FACE TO FACE EVALUATION WAS PERFORMED  Mayzie Caughlin Karis Juba 08/20/2020, 1:08 PM

## 2020-08-20 NOTE — Progress Notes (Signed)
Speech Language Pathology Daily Session Note  Patient Details  Name: JENELL DOBRANSKY MRN: [id number] Date of Birth: 03-01-1946  Today's Date: 08/20/2020 SLP Individual Time: 1345-1430 SLP Individual Time Calculation (min): 45 min  Short Term Goals: Week 3: SLP Short Term Goal 1 (Week 3): Patient will initiate functional tasks in 50% of opportunities with Mod A verbal cues. SLP Short Term Goal 2 (Week 3): Patient will demonstrate sustained attention to functional tasks for 5-7 minutes with Max verbal cues for redirection. SLP Short Term Goal 3 (Week 3): Patient will demonstrate orientation to place, time, biographical information, diagnoses and recent medical events with Mod A multimodal cues. SLP Short Term Goal 4 (Week 3): Patient will maintain topic of conversation for ~6 turns with Mod A verbal cues. SLP Short Term Goal 5 (Week 3): Pt will understand, recall and utilize memory notebook to increase carryover of functional information and therapeutic events with max A verbal cues  Skilled Therapeutic Interventions: Skilled SLP intervention focused on cognition. Pt able to recall doctors name "Allena Katz" during conversation about rehab plan. She was unable to recall any other therapies completed this morning and stated she wasn't sure how accurate information was in memory book because she did not remember completing these tasks. Pt perseverative on "I dont know anything" "no one has told me anything." Pt given written aid to increase recall of medical events and purpose for being here in rehab.Improvement in ability to use room phone. She was able to successfully dial family members number when given verbal directions. Pt left seated upright in chair with call button within reach and chair alarm set. Cont with therapy per plan of care.      Pain Pain Assessment Pain Scale: Faces Pain Score: 2  Faces Pain Scale: No hurt  Therapy/Group: Individual Therapy  Carlean Jews Charlcie Prisco 08/20/2020, 2:24 PM

## 2020-08-20 NOTE — Progress Notes (Signed)
Physical Therapy Session Note  Patient Details  Name: Danielle Kirby MRN: [id number] Date of Birth: [birth year]  Today's Date: 08/20/2020 PT Individual Time: 7510-2585 PT time : 30 minutes.   Short Term Goals: Week 2:  PT Short Term Goal 1 (Week 2): Patient will ambulate 100' with LRAD and min A. PT Short Term Goal 1 - Progress (Week 2): Met PT Short Term Goal 2 (Week 2): Patient will demonstrate standing for 3 minutes for functional task with min cues for sustained attention to task. PT Short Term Goal 2 - Progress (Week 2): Met PT Short Term Goal 3 (Week 2): Patient will negotiate 4 (6') steps with min A with rails. PT Short Term Goal 3 - Progress (Week 2): Met PT Short Term Goal 4 (Week 2): Patient will demonstrate functional transfers with CGA. PT Short Term Goal 4 - Progress (Week 2): Met  Skilled Therapeutic Interventions/Progress Updates: Pt presents sitting in w/c and agreeable to therapy.  Pt wheeled to main gym for time conservation.  Pt negotiated (4) 6" steps w/ 1 rail and CGA, but verbal cues for step-to gait pattern for improved performance, then (4) 6" steps x 3 to equal 12 steps and CGA.  Pt requires reminders for NWB RUE.  Pt amb multiple trials w/o AD and min A x 150' including turns to return to seat.  Pt requires verbal cues for visual scanning, foot clearance.  Pt returned to room and remained in w/c w/ seat alarm on and all needs in reach. Ambulation/gait training;DME/adaptive equipment instruction;Neuromuscular re-education;Stair training;UE/LE Strength taining/ROM;Wheelchair propulsion/positioning;UE/LE Coordination activities;Therapeutic Activities;Discharge planning;Balance/vestibular training;Cognitive remediation/compensation;Disease management/prevention;Functional mobility training;Patient/family education;Therapeutic Exercise;Splinting/orthotics;Visual/perceptual remediation/compensation   Therapy Documentation Precautions:  Precautions Precautions:  Fall Precaution Comments: RUE sling and sugar tong splint Required Braces or Orthoses: Sling Restrictions Weight Bearing Restrictions: Yes RUE Weight Bearing: Non weight bearing General:   Vital Signs:  Pain:0/10 Pain Assessment Pain Scale: 0-10 Pain Score: 2  Mobility:      Therapy/Group: Individual Therapy  [name] 08/20/2020, 1:19 PM

## 2020-08-21 ENCOUNTER — Inpatient Hospital Stay (HOSPITAL_COMMUNITY): Payer: Medicare Other

## 2020-08-21 NOTE — Progress Notes (Signed)
Physical Therapy Session Note  Patient Details  Name: Danielle Kirby MRN: [id number] Date of Birth: [birth year]  Today's Date: 08/21/2020 PT Individual Time: 1500-1600 PT Individual Time Calculation (min): 60 min   Short Term Goals: Week 3:  PT Short Term Goal 1 (Week 3): Patient will perform standing balance dynamic activity with S without UE support. PT Short Term Goal 2 (Week 3): Patient will ambulate 46' with S with LRAD PT Short Term Goal 3 (Week 3): Patient will negotiate 4 steps with rail and S. PT Short Term Goal 4 (Week 3): Patient will demonstrate session to session recall with mod multimodal cues.  Skilled Therapeutic Interventions/Progress Updates:    Patient in supine on end of the bed with spouse in the room.  Patient S for supine to sit and with increased time and cues sit to stand with CGA.  Ambulated without device with CGA to min A to therapy gym.  Patient negotiated 12 steps with rail on L and CGA.  Patient on [employer] balance for LOS with intermittent L UE support and mod cues occasional min A.  Played catch game with easy difficulty random selection with mod cues x 112 seconds, then medium difficulty LOS 222 seconds.  Patient ambulated down elevator and out west ground level entrance with min A no device.  After seated rest on bench ambulated with QC and min A over uneven brick and paved surfaces and up steps with L rail and min A.  Patient ambulating over 400' with 2 seated rest breaks.  Patient back in room and recall to spouse events of session with choice of 2 recalled correctly events of session.  Patient left with alarm belt on at EOB with spouse in the room.   Therapy Documentation Precautions:  Precautions Precautions: Fall Precaution Comments: RUE sling and sugar tong splint Required Braces or Orthoses: Sling Restrictions Weight Bearing Restrictions: Yes RUE Weight Bearing: Non weight bearing Pain: Pain Assessment Pain Scale: 0-10 Pain Score: 0-No pain      Therapy/Group: Individual Therapy  Elray Mcgregor  Sheran Lawless, PT 08/21/2020, 4:58 PM

## 2020-08-21 NOTE — Progress Notes (Signed)
[place] PHYSICAL MEDICINE & REHABILITATION PROGRESS NOTE  Subjective/Complaints: Patient seen laying in bed this morning.  She states she slept well overnight.  No reported issues overnight.  ROS: Limited by cognition.   Objective: Vital Signs: Blood pressure (!) 157/81, pulse 70, temperature 99.1 F (37.3 C), temperature source Oral, resp. rate 17, SpO2 100 %. No results found. No results for input(s): WBC, HGB, HCT, PLT in the last 72 hours. Recent Labs    08/20/20 0956  NA 141  K 4.0  CL 107  CO2 27  GLUCOSE 114*  BUN 9  CREATININE 0.73  CALCIUM 10.0    Intake/Output Summary (Last 24 hours) at 08/21/2020 1212 Last data filed at 08/21/2020 0817 Gross per 24 hour  Intake 600 ml  Output --  Net 600 ml        Physical Exam: BP (!) 157/81 (BP Location: Left Arm)   Pulse 70   Temp 99.1 F (37.3 C) (Oral)   Resp 17   SpO2 100%   Constitutional: No distress . Vital signs reviewed. HENT: Normocephalic.  Atraumatic. Eyes: EOMI. No discharge. Cardiovascular: No JVD.  RRR. Respiratory: Normal effort.  No stridor.  Bilateral clear to auscultation. GI: Non-distended.  BS +. Skin: Warm and dry.  Intact. Psych: Confused.  Confabulatory.. Musc: No edema in extremities.  No tenderness in extremities. Neuro: Alert x oriented x 2 Motor: 5/5 proximal to distal Limited insight and awareness.  Motor: LUE/LLE: 4-4+/5 proximal distal, appears unchanged RUE: Shoulder abduction 4/5, distally limited by splint, appears unchanged RLE: 4/5 proximal to distal, appears stable  Assessment/Plan: 1. Functional deficits which require 3+ hours per day of interdisciplinary therapy in a comprehensive inpatient rehab setting.  Physiatrist is providing close team supervision and 24 hour management of active medical problems listed below.  Physiatrist and rehab team continue to assess barriers to discharge/monitor patient progress toward functional and medical goals   Care  Tool:  Bathing    Body parts bathed by patient: Right arm,Chest,Abdomen,Front perineal area,Buttocks,Right upper leg,Left upper leg,Left lower leg,Face,Right lower leg   Body parts bathed by helper: Left arm     Bathing assist Assist Level: Minimal Assistance - Patient > 75%     Upper Body Dressing/Undressing Upper body dressing   What is the patient wearing?: Pull over shirt    Upper body assist Assist Level: Minimal Assistance - Patient > 75%    Lower Body Dressing/Undressing Lower body dressing      What is the patient wearing?: Pants     Lower body assist Assist for lower body dressing: Minimal Assistance - Patient > 75%     Toileting Toileting    Toileting assist Assist for toileting: Moderate Assistance - Patient 50 - 74%     Transfers Chair/bed transfer  Transfers assist     Chair/bed transfer assist level: Contact Guard/Touching assist     Locomotion Ambulation   Ambulation assist   Ambulation activity did not occur: Safety/medical concerns  Assist level: Contact Guard/Touching assist Assistive device: No Device Max distance: 150'   Walk 10 feet activity   Assist  Walk 10 feet activity did not occur: Safety/medical concerns  Assist level: Contact Guard/Touching assist Assistive device: No Device   Walk 50 feet activity   Assist Walk 50 feet with 2 turns activity did not occur: Safety/medical concerns  Assist level: Contact Guard/Touching assist Assistive device: No Device    Walk 150 feet activity   Assist Walk 150 feet activity did not occur:  Safety/medical concerns  Assist level: Contact Guard/Touching assist Assistive device: No Device    Walk 10 feet on uneven surface  activity   Assist Walk 10 feet on uneven surfaces activity did not occur: Safety/medical concerns   Assist level: Moderate Assistance - Patient - 50 - 74% Assistive device: Youth worker Will patient use wheelchair at  discharge?: No Type of Wheelchair: Manual Wheelchair activity did not occur: Safety/medical concerns  Wheelchair assist level: Supervision/Verbal cueing Max wheelchair distance: 120'    Wheelchair 50 feet with 2 turns activity    Assist    Wheelchair 50 feet with 2 turns activity did not occur: Safety/medical concerns   Assist Level: Supervision/Verbal cueing   Wheelchair 150 feet activity     Assist  Wheelchair 150 feet activity did not occur: Safety/medical concerns        Medical Problem List and Plan: 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 Completed 7-day course of Keppra for seizure prophylaxis. Follow-up Dr. Conchita Paris  Continue CIR 2. 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 No current plans for long-term anticoagulation due to risk of fall/SDH.   Repeat vascular study reviewed, appears unchanged, left calf DVT no proximal migration -antiplatelet therapy: Continue Lovenox 30 mg every 12 hours 3. Pain Management:Continue Naprosyn 250 mg twice daily, Ultram as needed  Appears controlled on 5/17 4. Mood:Provide emotional support -antipsychotic agents: Seroquel 12.5 mg qhs increased to 25 on 5/10 5. Neuropsych: This patientis notcapable of making decisions on herown behalf.  Telesitter for safety 6. Skin/Wound Care:Routine skin checks 7. Fluids/Electrolytes/Nutrition:Routine in and outs  CMP within acceptable range on 5/16 8. Right maxillary sinus, right lateral orbital wall and floor fractures with fat herniation. Follow-up outpatient Dr Leta Baptist. No surgical intervention planned 9. 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 Follow-up as outpatient. 10. Hypertension. Continue Norvasc 5 mg daily. Vitals:   08/20/20 1932 08/21/20 0623  BP: (!) 132/95 (!) 157/81  Pulse: 93 70  Resp: 17 17  Temp: 98.5 F (36.9 C) 99.1 F (37.3 C)  SpO2: 96% 100%    Slightly labile on 5/17, appears elevated at night, will consider adjustment to medications if persistent 11. Incidental finding of abnormal appearing endometrium identified on MRI lumbar spine. Follow-up outpatient OB/GYN 12. Right distal comminuted distal radius fracture and minimally displaced radial head fracture. No surgical intervention at this time follow-up outpatient Dr. Merlyn Lot.  -Nonweightbearing with splintRUE. -encourage pt to keep splint in place 13.  Acute blood loss anemia  Hemoglobin 10.2 on 5/9, plan order labs for later this week  Continue to monitor 14. Sleep disturbance  Seroquel ordered  Sleep chart ordered  Appears to be improving  LOS: 19 days A FACE TO FACE EVALUATION WAS PERFORMED  Sinan Tuch Karis Juba 08/21/2020, 12:12 PM

## 2020-08-21 NOTE — Progress Notes (Signed)
Speech Language Pathology Daily Session Note  Patient Details  Name: Danielle Kirby MRN: [id number] Date of Birth: 05-May-1945  Today's Date: 08/21/2020 SLP Individual Time: 0915-1010 SLP Individual Time Calculation (min): 55 min  Short Term Goals: Week 3: SLP Short Term Goal 1 (Week 3): Patient will initiate functional tasks in 50% of opportunities with Mod A verbal cues. SLP Short Term Goal 2 (Week 3): Patient will demonstrate sustained attention to functional tasks for 5-7 minutes with Max verbal cues for redirection. SLP Short Term Goal 3 (Week 3): Patient will demonstrate orientation to place, time, biographical information, diagnoses and recent medical events with Mod A multimodal cues. SLP Short Term Goal 4 (Week 3): Patient will maintain topic of conversation for ~6 turns with Mod A verbal cues. SLP Short Term Goal 5 (Week 3): Pt will understand, recall and utilize memory notebook to increase carryover of functional information and therapeutic events with max A verbal cues  Skilled Therapeutic Interventions: Skilled SLP intervention focused on cognition. Pt demonstrated increased difficulty this session with reasoning medical situation. She stated she does not believe the information she is told by therapists and doctors and appeared offended by SLP explaining purpose of rehab stay. She requires max A for reasoning, awareness of physical limitations, and safety. Pt in agreement to write down questions to ask MD regarding why she is on BP medication but stated "it doesn't matter because the doctor does not really know me." Pt shown DC date on calender and she was able to orient to date and month with mod A verbal cues. Cont with therapy per plan of care.       Pain Pain Assessment Pain Scale: Faces Faces Pain Scale: No hurt  Therapy/Group: Individual Therapy  Danielle Kirby 08/21/2020, 10:30 AM

## 2020-08-21 NOTE — Progress Notes (Signed)
Occupational Therapy TBI Note  Patient Details  Name: Danielle Kirby MRN: [id number] Date of Birth: 1945/07/29  Today's Date: 08/21/2020 OT Individual Time: 1100-1200 OT Individual Time Calculation (min): 60 min    Short Term Goals: Week 3:  OT Short Term Goal 1 (Week 3): Patient will complete 2/3 steps of toileting with min A for advancing past hips. OT Short Term Goal 2 (Week 3): Patient will complete grooming task with no more than 2 cues for task initiation/termination. OT Short Term Goal 3 (Week 3): Patient will complete LBD at no more than min A. OT Short Term Goal 4 (Week 3): Patient will complete UB/LB dressing tasks with mod cues to attend to task.  Skilled Therapeutic Interventions/Progress Updates:    Pt received supine in bed with BLEs out of bed, no pain reported, and agreeable to OT session. Sup>sit close spvsn using bed rails. Pt able to use visual cues with one vc to identify and correctly state date, month, and day of week. Sit<>stand CGA. Pt ambulated ~25' CGA using [facility] bed>bathroom>chair with cues to use cane. Toilet transfer CGA; pt attempted to void bladder but was unable; complained of need for laxative for BM. LB dressing min A to pull over hips in stance. UB dressing min A to pull long sleeve shirt over sugar tong splint. Pt's PICC line covered with flexi seal and sugar tong splint covered for showering. Pt showered w/ close spvsn and CGA in stance for peri hygiene; mod vc's due to perseveration on washing certain body parts. Pt able to recall son went to buy her underwear which he delivered during shower. Following shower, pt ambulated to dresser wrapped in towels to retrieve clothing; pt demo'd some perseveration on new underwear and required mod vc's to select pants (and correctly identify certain clothing items). Pt completed oral hygiene sitting at sink with set up A; no cues required for termination of task this session. Pt directed to recall activities in session for  memory notebook; no vc's required to recall session activities. Pt engaged in [place] and trunk stretching exercises seated and in stance in preparation for improved mobility and functional ADLs. Pt left seated in w/c with alarm set, call bell in reach, and all needs met.  Therapy Documentation Precautions:  Precautions Precautions: Fall Precaution Comments: RUE sling and sugar tong splint Required Braces or Orthoses: Sling Restrictions Weight Bearing Restrictions: Yes RUE Weight Bearing: Non weight bearing Pain: Pain Assessment Pain Scale: 0-10 Pain Score: 0-No pain Agitated Behavior Scale: TBI Observation Details Observation Environment: pt room Start of observation period - Date: 08/21/20 Start of observation period - Time: 1100 End of observation period - Date: 08/21/20 End of observation period - Time: 1200 Agitated Behavior Scale (DO NOT LEAVE BLANKS) Short attention span, easy distractibility, inability to concentrate: Present to a slight degree Impulsive, impatient, low tolerance for pain or frustration: Absent Uncooperative, resistant to care, demanding: Absent Violent and/or threatening violence toward people or property: Absent Explosive and/or unpredictable anger: Absent Rocking, rubbing, moaning, or other self-stimulating behavior: Absent Pulling at tubes, restraints, etc.: Absent Wandering from treatment areas: Absent Restlessness, pacing, excessive movement: Absent Repetitive behaviors, motor, and/or verbal: Present to a slight degree Rapid, loud, or excessive talking: Absent Sudden changes of mood: Absent Easily initiated or excessive crying and/or laughter: Absent Self-abusiveness, physical and/or verbal: Absent Agitated behavior scale total score: 16    Therapy/Group: Individual Therapy  Mellissa Kohut 08/21/2020, 2:44 PM

## 2020-08-22 MED ORDER — AMLODIPINE BESYLATE 5 MG PO TABS
5.0000 mg | ORAL_TABLET | Freq: Two times a day (BID) | ORAL | Status: DC
  Administered 2020-08-23 – 2020-08-29 (×13): 5 mg via ORAL
  Filled 2020-08-22 (×15): qty 1

## 2020-08-22 NOTE — Progress Notes (Signed)
Physical Therapy Session Note  Patient Details  Name: Danielle Kirby MRN: [id number] Date of Birth: [birth year]  Today's Date: 08/22/2020 PT Individual Time: 6387-5643 PT Individual Time Calculation (min): 57 min   Short Term Goals: Week 3:  PT Short Term Goal 1 (Week 3): Patient will perform standing balance dynamic activity with S without UE support. PT Short Term Goal 2 (Week 3): Patient will ambulate 18' with S with LRAD PT Short Term Goal 3 (Week 3): Patient will negotiate 4 steps with rail and S. PT Short Term Goal 4 (Week 3): Patient will demonstrate session to session recall with mod multimodal cues.  Skilled Therapeutic Interventions/Progress Updates:    Patient seated in w/c and reports foods eaten for breakfast.  Did not recall spouse here yesterday.  Donned shoes with S from w/c level.  Patient sit to stand with CGA.  Ambulated with QC and occasional CGA to dayroom x 60'.  Performed Kinetron 2 x 2 minutes @ 50 cm/sec.  Patient ambulated 150' with SPC with occasional CGA.  Figure 8's around 2 chairs with canes and CGA and min cues.  Patient performed Berg balance assessment as noted below. Patient ambulated 38' while kicking bean bags with min A no AD for working on SLS.  Patient ambulated to room with [facility] and S/CGA and during handoff to OT worked on recall of events of session and wrote in [employer]  Therapy Documentation Precautions:  Precautions Precautions: Fall Precaution Comments: RUE sling and sugar tong splint Required Braces or Orthoses: Sling Restrictions Weight Bearing Restrictions: Yes RUE Weight Bearing: Non weight bearing Pain: Pain Assessment Pain Score: 0-No pain Balance: Standardized Balance Assessment Standardized Balance Assessment: Berg Balance Test Berg Balance Test Sit to Stand: Able to stand without using hands and stabilize independently Standing Unsupported: Able to stand safely 2 minutes Sitting with Back Unsupported but Feet Supported on  Floor or Stool: Able to sit safely and securely 2 minutes Stand to Sit: Sits safely with minimal use of hands Transfers: Able to transfer safely, definite need of hands Standing Unsupported with Eyes Closed: Able to stand 10 seconds safely Standing Ubsupported with Feet Together: Able to place feet together independently and stand for 1 minute with supervision From Standing, Reach Forward with Outstretched Arm: Can reach forward >12 cm safely (5") From Standing Position, Pick up Object from Floor: Able to pick up shoe, needs supervision From Standing Position, Turn to Look Behind Over each Shoulder: Looks behind one side only/other side shows less weight shift Turn 360 Degrees: Needs close supervision or verbal cueing Standing Unsupported, Alternately Place Feet on Step/Stool: Able to complete >2 steps/needs minimal assist Standing Unsupported, One Foot in Front: Able to plae foot ahead of the other independently and hold 30 seconds Standing on One Leg: Tries to lift leg/unable to hold 3 seconds but remains standing independently Total Score: 41    Therapy/Group: Individual Therapy  Elray Mcgregor  Sheran Lawless, PT 08/22/2020, 10:17 AM

## 2020-08-22 NOTE — Progress Notes (Signed)
Patient ID: Danielle Kirby, female   DOB: 07/21/1945, 75 y.o.   MRN: [id number]  Met with pt and spoke with [facility] via telephone to inform of team conference progress with goals of supervision-CGA and the need for family education next week prior to discharge. [employer] discharge date 5/26. He will come in Tuesday 5/24 from 9-12 for education. Continue to work on discharge needs.

## 2020-08-22 NOTE — Progress Notes (Signed)
[facility] PHYSICAL MEDICINE & REHABILITATION PROGRESS NOTE  Subjective/Complaints: Patient seen sitting up in bed this AM.  She states she slept well overnight. Sleep chart not updated. No reported issues overnight.   ROS: Limited by cognition.   Objective: Vital Signs: Blood pressure (!) 164/93, pulse 70, temperature 98.1 F (36.7 C), temperature source Oral, resp. rate 18, SpO2 100 %. DG ELBOW COMPLETE RIGHT (3+VIEW)  Result Date: 08/21/2020 CLINICAL DATA:  Elbow injury EXAM: RIGHT ELBOW - COMPLETE 3+ VIEW COMPARISON:  08/03/2020 trauma CT 07/26/2020 FINDINGS: Casting material limits bone detail. 8 mm capitellar fracture fragment similar in position along the posterolateral aspect of the capitellum. No change in alignment and progressive healing of coronoid process fracture. Slight increased displacement of intra-articular radial head neck fracture fragment with some callus formation but readily visible fracture lucencies. IMPRESSION: 1. Stable alignment of 8 mm capitellar fracture fragment. Stable alignment and progressive healing of coronoid process fracture 2. Slight increased displacement of radial head neck fracture fragment with some periosteal new bone formation but readily apparent fracture lucencies. Electronically Signed   By: Jasmine Pang M.D.   On: 08/21/2020 20:31   DG Wrist Complete Right  Result Date: 08/21/2020 CLINICAL DATA:  Wrist fracture EXAM: RIGHT WRIST - COMPLETE 3+ VIEW COMPARISON:  08/03/2020, 07/26/2020 FINDINGS: Casting material limits bone detail. Similar appearance and alignment of displaced ulnar styloid process fracture. Impacted intra-articular distal radius fracture with slight increased dorsal displacement compared with 08/03/2020. Interval callus formation consistent with healing response though with fracture lines faintly visible. IMPRESSION: 1. No change in alignment of ulnar styloid process fracture 2. Slight increased dorsal displacement of comminuted  intra-articular distal radius fracture though with some interval callus formation Electronically Signed   By: Jasmine Pang M.D.   On: 08/21/2020 20:27   No results for input(s): WBC, HGB, HCT, PLT in the last 72 hours. Recent Labs    08/20/20 0956  NA 141  K 4.0  CL 107  CO2 27  GLUCOSE 114*  BUN 9  CREATININE 0.73  CALCIUM 10.0    Intake/Output Summary (Last 24 hours) at 08/22/2020 1014 Last data filed at 08/22/2020 0834 Gross per 24 hour  Intake 120 ml  Output --  Net 120 ml        Physical Exam: BP (!) 164/93 (BP Location: Right Arm)   Pulse 70   Temp 98.1 F (36.7 C) (Oral)   Resp 18   SpO2 100%   Constitutional: No distress . Vital signs reviewed. HENT: Normocephalic.  Atraumatic. Eyes: EOMI. No discharge. Cardiovascular: No JVD.  RRR. Respiratory: Normal effort.  No stridor.  Bilateral clear to auscultation. GI: Non-distended.  BS +. Skin: Warm and dry.  Intact. Psych: Confused. Confabulatory.  Musc: No edema in extremities.  No tenderness in extremities. Neuro: Alert x oriented x 2 Motor: 5/5 proximal to distal Limited insight and awareness.  Motor: LUE/LLE: 4-4+/5 proximal distal, appears stable RUE: Shoulder abduction 4/5, distally limited by splint, appears unchanged RLE: 4/5 proximal to distal, appears stable  Assessment/Plan: 1. Functional deficits which require 3+ hours per day of interdisciplinary therapy in a comprehensive inpatient rehab setting.  Physiatrist is providing close team supervision and 24 hour management of active medical problems listed below.  Physiatrist and rehab team continue to assess barriers to discharge/monitor patient progress toward functional and medical goals   Care Tool:  Bathing    Body parts bathed by patient: Right arm,Chest,Abdomen,Front perineal area,Buttocks,Right upper leg,Left upper leg,Left lower leg,Face,Right lower leg,Left  arm   Body parts bathed by helper: Left arm     Bathing assist Assist Level:  Contact Guard/Touching assist     Upper Body Dressing/Undressing Upper body dressing   What is the patient wearing?: Pull over shirt    Upper body assist Assist Level: Minimal Assistance - Patient > 75%    Lower Body Dressing/Undressing Lower body dressing      What is the patient wearing?: Pants,Underwear/pull up     Lower body assist Assist for lower body dressing: Minimal Assistance - Patient > 75%     Toileting Toileting    Toileting assist Assist for toileting: Moderate Assistance - Patient 50 - 74%     Transfers Chair/bed transfer  Transfers assist     Chair/bed transfer assist level: Contact Guard/Touching assist     Locomotion Ambulation   Ambulation assist   Ambulation activity did not occur: Safety/medical concerns  Assist level: Contact Guard/Touching assist Assistive device: No Device Max distance: 150'   Walk 10 feet activity   Assist  Walk 10 feet activity did not occur: Safety/medical concerns  Assist level: Contact Guard/Touching assist Assistive device: No Device   Walk 50 feet activity   Assist Walk 50 feet with 2 turns activity did not occur: Safety/medical concerns  Assist level: Contact Guard/Touching assist Assistive device: No Device    Walk 150 feet activity   Assist Walk 150 feet activity did not occur: Safety/medical concerns  Assist level: Contact Guard/Touching assist Assistive device: No Device    Walk 10 feet on uneven surface  activity   Assist Walk 10 feet on uneven surfaces activity did not occur: Safety/medical concerns   Assist level: Moderate Assistance - Patient - 50 - 74% Assistive device: Youth worker Will patient use wheelchair at discharge?: No Type of Wheelchair: Manual Wheelchair activity did not occur: Safety/medical concerns  Wheelchair assist level: Supervision/Verbal cueing Max wheelchair distance: 120'    Wheelchair 50 feet with 2 turns  activity    Assist    Wheelchair 50 feet with 2 turns activity did not occur: Safety/medical concerns   Assist Level: Supervision/Verbal cueing   Wheelchair 150 feet activity     Assist  Wheelchair 150 feet activity did not occur: Safety/medical concerns        Medical Problem List and Plan: 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 Completed 7-day course of Keppra for seizure prophylaxis. Follow-up Dr. Conchita Paris  Continue CIR  Team conference today to discuss current and goals and coordination of care, home and environmental barriers, and discharge planning with nursing, case manager, and therapies. Please see conference note from today as well.  2. 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 No current plans for long-term anticoagulation due to risk of fall/SDH.   Repeat vascular study reviewed, appears unchanged, left calf DVT no proximal migration  Plan to repeat next week -antiplatelet therapy: Continue Lovenox 30 mg every 12 hours 3. Pain Management:Continue Naprosyn 250 mg twice daily, Ultram as needed  Appears controlled on 5/18 4. Mood:Provide emotional support -antipsychotic agents: Seroquel 12.5 mg qhs increased to 25 on 5/10 5. Neuropsych: This patientis notcapable of making decisions on herown behalf.  Telesitter for safety 6. Skin/Wound Care:Routine skin checks 7. Fluids/Electrolytes/Nutrition:Routine in and outs  CMP within acceptable range on 5/16 8. Right maxillary sinus, right lateral orbital wall and floor fractures with fat herniation. Follow-up outpatient Dr Leta Baptist. No surgical intervention planned 9. 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 Follow-up  as outpatient. 10. Hypertension.   Norvasc changed to 5 BID on 5/19. Vitals:   08/21/20 1954 08/22/20 0324  BP: 126/74 (!) 164/93  Pulse: 78 70  Resp: 18 18  Temp: 98.6 F (37 C) 98.1 F (36.7  C)  SpO2: 100% 100%   11. Incidental finding of abnormal appearing endometrium identified on MRI lumbar spine. Follow-up outpatient OB/GYN 12. Right distal comminuted distal radius fracture and minimally displaced radial head fracture. No surgical intervention at this time follow-up outpatient Dr. Merlyn Lot.  -Nonweightbearing with splintRUE. -encourage pt to keep splint in place 13.  Acute blood loss anemia  Hemoglobin 10.2 on 5/9, labs ordered for tomorrow   Continue to monitor 14. Sleep disturbance  Seroquel ordered  Sleep chart ordered  Appears to be improving  LOS: 20 days A FACE TO FACE EVALUATION WAS PERFORMED  Spiro Ausborn Karis Juba 08/22/2020, 10:14 AM

## 2020-08-22 NOTE — Progress Notes (Signed)
Nutrition Follow-up  DOCUMENTATION CODES:   Not applicable  INTERVENTION:  Continue Ensure Enlive po BID, each supplement provides 350 kcal and 20 grams of protein.  Continue Magic cup TID with meals, each supplement provides 290 kcal and 9 grams of protein  Encourage adequate PO intake.   NUTRITION DIAGNOSIS:   Increased nutrient needs related to other (see comment) (TBI) as evidenced by estimated needs; ongoing  GOAL:   Patient will meet greater than or equal to 90% of their needs; progressing  MONITOR:   PO intake,Supplement acceptance,Weight trends  REASON FOR ASSESSMENT:   Malnutrition Screening Tool    ASSESSMENT:   75 year old female with PMH of cataracts and osteoarthritis. Presented 07/26/20 after reports of multiple falls. Pt found to have SDH and facial fractures. Pt also with right distal radius fracture and minimally displaced radial head fracture. Admitted to CIR on 4/28.  Meal completion has been 85-100%. Pt has been tolerating her PO. Pt currently has Ensure ordered with varied consumption. RD to continue with current orders to aid in caloric and protein needs. Pt encouraged to eat her food at meals and to drink her supplements. Labs and medications reviewed.   Diet Order:   Diet Order            Diet regular Room service appropriate? Yes with Assist; Fluid consistency: Thin  Diet effective now                 EDUCATION NEEDS:   No education needs have been identified at this time  Skin:  Skin Assessment: Reviewed RN Assessment  Last BM:  5/18  Height:   Ht Readings from Last 1 Encounters:  07/26/20 5\' 7"  (1.702 m)    Weight:   Wt Readings from Last 1 Encounters:  07/26/20 83.6 kg   BMI:  There is no height or weight on file to calculate BMI.  Estimated Nutritional Needs:   Kcal:  1700-1900  Protein:  85-100 grams  Fluid:  1.7 L/day  07/28/20, MS, RD, LDN RD pager number/after hours weekend pager number on Amion.

## 2020-08-22 NOTE — Progress Notes (Signed)
Speech Language Pathology Daily Session Note  Patient Details  Name: Danielle Kirby MRN: [id number] Date of Birth: 09/17/1945  Today's Date: 08/22/2020 SLP Individual Time: 1415-1455 SLP Individual Time Calculation (min): 40 min  Short Term Goals: Week 3: SLP Short Term Goal 1 (Week 3): Patient will initiate functional tasks in 50% of opportunities with Mod A verbal cues. SLP Short Term Goal 2 (Week 3): Patient will demonstrate sustained attention to functional tasks for 5-7 minutes with Max verbal cues for redirection. SLP Short Term Goal 3 (Week 3): Patient will demonstrate orientation to place, time, biographical information, diagnoses and recent medical events with Mod A multimodal cues. SLP Short Term Goal 4 (Week 3): Patient will maintain topic of conversation for ~6 turns with Mod A verbal cues. SLP Short Term Goal 5 (Week 3): Pt will understand, recall and utilize memory notebook to increase carryover of functional information and therapeutic events with max A verbal cues  Skilled Therapeutic Interventions: Skilled SLP intervention focused on cognition. Pt completed time calculation problems presented verbally with min-mod A due to decreased attention to verbal information. She demonstrated improvement in problem solving skills with basic problem solving tasks but continues to require mod A for redirection to topics and tasks being completed. [place] persever[place]". She required max A for reasoning and safety awareness following dc home. Cont with therapy per plan of care. Attempt medication management next session since patient is more attentive to tasks and demonstrates improved problem solving with tasks.       Pain Pain Assessment Pain Scale: Faces Faces Pain Scale: No hurt  Therapy/Group: Individual Therapy  Carlean Jews Snow Peoples 08/22/2020, 3:05 PM

## 2020-08-22 NOTE — Progress Notes (Signed)
Telesitter going off and upon entering the patient is walking around the room attempting to find the bathroom. When attempting to re direct patient she starts becoming verbally aggressive telling primary nurse to get out and Clinical research associate to leave. Writer did not leave and continued to attempt to re orient patient but failed. Patient went to the bathroom where she sat for 20 minutes then attempted to start the shower will full clothes on. After several back ups patient is finally in bed with side rails up and bed alarm on medium setting. Patient absolutely refused safety belt while in bed.

## 2020-08-22 NOTE — Progress Notes (Signed)
Occupational Therapy TBI Note  Patient Details  Name: Danielle Kirby MRN: [id number] Date of Birth: 1945-11-19  Today's Date: 08/22/2020 OT Individual Time: 6389-3734 OT Individual Time Calculation (min): 54 min + 53 min   Short Term Goals: Week 3:  OT Short Term Goal 1 (Week 3): Patient will complete 2/3 steps of toileting with min A for advancing past hips. OT Short Term Goal 2 (Week 3): Patient will complete grooming task with no more than 2 cues for task initiation/termination. OT Short Term Goal 3 (Week 3): Patient will complete LBD at no more than min A. OT Short Term Goal 4 (Week 3): Patient will complete UB/LB dressing tasks with mod cues to attend to task.  Skilled Therapeutic Interventions/Progress Updates:    Session 1: Pt received seated EOB finishing breakfast with NT present, denies pain,  agreeable to use bathroom. Pt noted to be easily externally/internally distracted and minimally agitated with this therapist's presence "you can't help me." Stand-pivot from EOB to w/c with [facility] + CGA after encouragement to exit bed. Pt becoming distracted by disorganization in room, req to reorganized drawers. Pt req to use bathroom, but declining ambulation. Stand-pivot to and from toilet CGA with use of grab bar. Cont void of b/b and CGA for standing pericare and LB clothing management. Agreeable to remain seated in w/c.  Pt left seated in w/c with safety belt alarm engaged, call bell in reach, and all immediate needs met.    Session 2: Pt received finish PT with PT, hand off in pt's room. Pt denies pain throughout session but endorses fatigue, agreeable to therapy. Session focus on functional cognition, problem solving, attention to task in prep for improved ADL performance. W/c transport to and from gym 2/2 energy conservation and time management. In kitchen, pt able to articulate all req items/ingredients req to make oatmeal on stove. Stood to combine ingredients, req seated rest break after  standing for ~5 min, but able to identify that stove needed to be turned on medium heat until oatmeal "bubbled." Overall, req min VCs for initiation of task and min A to open items. Seated at [facility], completed memory game x 2 with first 80% accuracy then 66% accuracy. Pt able to ind recall sequence of up to 5 items with mod VCs throughout game for game play. Additionally, completed Trail Making Part A with min VCs to accuracy sequence numbers up to 25 and mod VCs for game play. Finally, completed Bell Cancellation task within 5 min and only 6 errors (located on pt's R side.) Much noted improvement in game accuracy this date compared to previous sessions. Pt additionally able to ind recognize 85% of fall/safety hazards on home safety worksheets (kitchen, front porch, living room, hallway). Pt req max A to recall OT session activities to write in memory notebook.  Pt left seated in w/c with safety belt alarm engaged, call bell in reach, and all immediate needs met.    Therapy Documentation Precautions:  Precautions Precautions: Fall Precaution Comments: RUE sling and sugar tong splint Required Braces or Orthoses: Sling Restrictions Weight Bearing Restrictions: Yes RUE Weight Bearing: Non weight bearing Pain: denies Pain Assessment Pain Score: 0-No pain Agitated Behavior Scale: TBI   See Flowsheets    ADL: See Care Tool for more details.   Therapy/Group: Individual Therapy  Volanda Napoleon MS, OTR/L  08/22/2020, 12:42 PM

## 2020-08-22 NOTE — Patient Care Conference (Signed)
Inpatient RehabilitationTeam Conference and Plan of Care Update Date: 08/22/2020   Time: 11:35 AM     Patient Name: Danielle Kirby      Medical Record Number: [id number]  Date of Birth: 1945/11/16 Sex: Female         Room/Bed: 4W13C/4W13C-01 Payor Info: Payor: MEDICARE / Plan: MEDICARE PART A / Product Type: *No Product type* /    Admit Date/Time:  08/02/2020  6:04 PM  Primary Diagnosis:  TBI (traumatic brain injury) [facility])  Hospital Problems: Principal Problem:   TBI (traumatic brain injury) (HCC) Active Problems:   Radial head fracture   Acute blood loss anemia   Essential hypertension   Traumatic injury of head with altered mental status   Post-operative pain   Benign essential HTN   Deep venous thrombosis (DVT) of left peroneal vein (HCC)   Sleep disturbance    Expected Discharge Date: Expected Discharge Date: 08/30/20  Team Members Present: Physician leading conference: Dr. Maryla Morrow Care Coodinator Present: Chana Bode, RN, BSN, CRRN;Becky Dupree, LCSW Nurse Present: Chana Bode, RN PT Present: Sheran Lawless, PT OT Present: Other (comment) Quentin Cornwall, OT) SLP Present: Other (comment) Fae Pippin, SLP) PPS Coordinator present : Fae Pippin, SLP     Current Status/Progress Goal Weekly Team Focus  Bowel/Bladder   Patient continent of bowel/bladder, LBM 08/20/2020  Patient will remain continent of bowel/bladder  Will assess qshift and PRN   Swallow/Nutrition/ Hydration             ADL's   bed mobility close spvsn, sit<>stand and ambulation with NBQC for functional transfers CGA and cues for safety, UB bathing/dressing spvsn-min A, LB bathing/dressing min-mod A, toileting min A; slight improved recall Tuesday morning  CS  adl training, functional transfers/mobility, cog/safety, visual perceptual activities, family education   Mobility   S bed mobility, CGA transfers, gait up to 300' with QC and CGA versus no device and min A with improved  fluency/focus/efficiency, steps x 12 CGA 1 rail, progressive focus on balance tasks and standing endurance tasks  S/CGA overall  safety with gait with cane vs without (likely attempt a BERG soon), focus, memory, attention   Communication             Safety/Cognition/ Behavioral Observations  mod-max A, continued poor awareness  Mod-Min A  orientation, sustained attention, basic problem solving and recall   Pain   Patient has no c/o pain  Patient will continue to remain pain free  Will assess qshift and PRN   Skin   Patient's skin is intact  Patient's skin remain intact  Will assess qshift and PRN     Discharge Planning:  [name]-has been here and seen in therapies, able to provide 24/7 care. will need family education prior to DC   Team Discussion: DVT left pop area, repeat US next week per MD. Sleep chart monitoring and discontinue PICC line.  Patient on target to meet rehab goals: Currently Supervision - CGA for transfers, ambulation 300' with a SPC and CGA - supervision assistance. Able to manage 2 steps with 1 rail and CGA for lower body ADLs standing. Currently mod - max assist for SLP with min - mod assist goals set for discharge.  *See Care Plan and progress notes for long and short-term goals.   Revisions to Treatment Plan:   Teaching Needs: Transfers, toileting, medications, etc.   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  Barriers to Discharge: Behavior;Medical stability;Weight bearing restrictions;Other (comments)  Barriers to Discharge Comments: DVT Possible Resolutions to Barriers/Weekly Focus: Therapies, follow BP, follow labs - Hb, RUE splint, repeat DVT U/S   Continued Need for Acute Rehabilitation Level of Care: The patient requires daily medical management by a physician with  specialized training in physical medicine and rehabilitation for the following reasons: Direction of a multidisciplinary physical rehabilitation program to maximize functional independence : Yes Medical management of patient stability for increased activity during participation in an intensive rehabilitation regime.: Yes Analysis of laboratory values and/or radiology reports with any subsequent need for medication adjustment and/or medical intervention. : Yes   I attest that I was present, lead the team conference, and concur with the assessment and plan of the team.   Chana Bode B 08/22/2020, 2:14 PM

## 2020-08-23 NOTE — Progress Notes (Signed)
[place] PHYSICAL MEDICINE & REHABILITATION PROGRESS NOTE  Subjective/Complaints: Patient seen sitting up in bed working with therapy this morning she believes she slept well overnight.  She slept better per sleep chart.  She remains confused.  ROS: Limited by cognition.   Objective: Vital Signs: Blood pressure (!) 154/97, pulse 65, temperature 98.1 F (36.7 C), temperature source Oral, resp. rate 16, SpO2 100 %. DG ELBOW COMPLETE RIGHT (3+VIEW)  Result Date: 08/21/2020 CLINICAL DATA:  Elbow injury EXAM: RIGHT ELBOW - COMPLETE 3+ VIEW COMPARISON:  08/03/2020 trauma CT 07/26/2020 FINDINGS: Casting material limits bone detail. 8 mm capitellar fracture fragment similar in position along the posterolateral aspect of the capitellum. No change in alignment and progressive healing of coronoid process fracture. Slight increased displacement of intra-articular radial head neck fracture fragment with some callus formation but readily visible fracture lucencies. IMPRESSION: 1. Stable alignment of 8 mm capitellar fracture fragment. Stable alignment and progressive healing of coronoid process fracture 2. Slight increased displacement of radial head neck fracture fragment with some periosteal new bone formation but readily apparent fracture lucencies. Electronically Signed   By: Jasmine Pang M.D.   On: 08/21/2020 20:31   DG Wrist Complete Right  Result Date: 08/21/2020 CLINICAL DATA:  Wrist fracture EXAM: RIGHT WRIST - COMPLETE 3+ VIEW COMPARISON:  08/03/2020, 07/26/2020 FINDINGS: Casting material limits bone detail. Similar appearance and alignment of displaced ulnar styloid process fracture. Impacted intra-articular distal radius fracture with slight increased dorsal displacement compared with 08/03/2020. Interval callus formation consistent with healing response though with fracture lines faintly visible. IMPRESSION: 1. No change in alignment of ulnar styloid process fracture 2. Slight increased dorsal  displacement of comminuted intra-articular distal radius fracture though with some interval callus formation Electronically Signed   By: Jasmine Pang M.D.   On: 08/21/2020 20:27   No results for input(s): WBC, HGB, HCT, PLT in the last 72 hours. No results for input(s): NA, K, CL, CO2, GLUCOSE, BUN, CREATININE, CALCIUM in the last 72 hours.  Intake/Output Summary (Last 24 hours) at 08/23/2020 1249 Last data filed at 08/23/2020 0700 Gross per 24 hour  Intake 130 ml  Output --  Net 130 ml        Physical Exam: BP (!) 154/97 (BP Location: Left Leg)   Pulse 65   Temp 98.1 F (36.7 C) (Oral)   Resp 16   SpO2 100%   Constitutional: No distress . Vital signs reviewed. HENT: Normocephalic.  Atraumatic. Eyes: EOMI. No discharge. Cardiovascular: No JVD.  RRR. Respiratory: Normal effort.  No stridor.  Bilateral clear to auscultation. GI: Non-distended.  BS +. Skin: Warm and dry.  Intact. Psych: Confused.  Confabulatory.. Musc: No edema in extremities.  No tenderness in extremities. Neuro: Alert x oriented x 2 Motor: 5/5 proximal to distal Limited insight and awareness.  Motor: LUE/LLE: 4-4+/5 proximal distal, unchanged RUE: Shoulder abduction 4/5, distally limited by splint, appears unchanged RLE: 4/5 proximal to distal,?  Improving  Assessment/Plan: 1. Functional deficits which require 3+ hours per day of interdisciplinary therapy in a comprehensive inpatient rehab setting.  Physiatrist is providing close team supervision and 24 hour management of active medical problems listed below.  Physiatrist and rehab team continue to assess barriers to discharge/monitor patient progress toward functional and medical goals   Care Tool:  Bathing    Body parts bathed by patient: Right arm,Chest,Abdomen,Front perineal area,Buttocks,Right upper leg,Left upper leg,Left lower leg,Face,Right lower leg,Left arm   Body parts bathed by helper: Left arm  Bathing assist Assist Level: Contact  Guard/Touching assist     Upper Body Dressing/Undressing Upper body dressing   What is the patient wearing?: Pull over shirt    Upper body assist Assist Level: Minimal Assistance - Patient > 75%    Lower Body Dressing/Undressing Lower body dressing      What is the patient wearing?: Pants,Underwear/pull up     Lower body assist Assist for lower body dressing: Minimal Assistance - Patient > 75%     Toileting Toileting    Toileting assist Assist for toileting: Supervision/Verbal cueing     Transfers Chair/bed transfer  Transfers assist     Chair/bed transfer assist level: Contact Guard/Touching assist     Locomotion Ambulation   Ambulation assist   Ambulation activity did not occur: Safety/medical concerns  Assist level: Contact Guard/Touching assist Assistive device: No Device Max distance: 150'   Walk 10 feet activity   Assist  Walk 10 feet activity did not occur: Safety/medical concerns  Assist level: Contact Guard/Touching assist Assistive device: No Device   Walk 50 feet activity   Assist Walk 50 feet with 2 turns activity did not occur: Safety/medical concerns  Assist level: Contact Guard/Touching assist Assistive device: No Device    Walk 150 feet activity   Assist Walk 150 feet activity did not occur: Safety/medical concerns  Assist level: Contact Guard/Touching assist Assistive device: No Device    Walk 10 feet on uneven surface  activity   Assist Walk 10 feet on uneven surfaces activity did not occur: Safety/medical concerns   Assist level: Moderate Assistance - Patient - 50 - 74% Assistive device: Youth worker Will patient use wheelchair at discharge?: No Type of Wheelchair: Manual Wheelchair activity did not occur: Safety/medical concerns  Wheelchair assist level: Supervision/Verbal cueing Max wheelchair distance: 120'    Wheelchair 50 feet with 2 turns activity    Assist     Wheelchair 50 feet with 2 turns activity did not occur: Safety/medical concerns   Assist Level: Supervision/Verbal cueing   Wheelchair 150 feet activity     Assist  Wheelchair 150 feet activity did not occur: Safety/medical concerns        Medical Problem List and Plan: 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 Completed 7-day course of Keppra for seizure prophylaxis. Follow-up Dr. Conchita Paris  Continue CIR 2. 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 No current plans for long-term anticoagulation due to risk of fall/SDH.   Repeat vascular study reviewed, appears unchanged, left calf DVT no proximal migration  Plan to repeat next week -antiplatelet therapy: Continue Lovenox 30 mg every 12 hours 3. Pain Management:Continue Naprosyn 250 mg twice daily, Ultram as needed  Appears controlled on 5/19 4. Mood:Provide emotional support -antipsychotic agents: Seroquel 12.5 mg qhs increased to 25 on 5/10 5. Neuropsych: This patientis notcapable of making decisions on herown behalf.  Telesitter for safety 6. Skin/Wound Care:Routine skin checks 7. Fluids/Electrolytes/Nutrition:Routine in and outs  CMP within acceptable range on 5/16 8. Right maxillary sinus, right lateral orbital wall and floor fractures with fat herniation. Follow-up outpatient Dr Leta Baptist. No surgical intervention planned 9. 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 Follow-up as outpatient. 10. Hypertension.   Norvasc changed to 5 BID on 5/19 due to overnight elevations. Vitals:   08/22/20 2120 08/23/20 0640  BP: 121/73 (!) 154/97  Pulse: 71 65  Resp: 16 16  Temp: 98.1 F (36.7 C)   SpO2: 100% 100%   11. Incidental finding of  abnormal appearing endometrium identified on MRI lumbar spine. Follow-up outpatient OB/GYN 12. Right distal comminuted distal radius fracture and minimally displaced radial head  fracture. No surgical intervention at this time follow-up outpatient Dr. Merlyn Lot.  -Nonweightbearing with splintRUE. -encourage pt to keep splint in place 13.  Acute blood loss anemia  Hemoglobin 10.2 on 5/9, labs pending  Continue to monitor 14. Sleep disturbance  Seroquel ordered  Sleep chart ordered  Appears to be improving overall  LOS: 21 days A FACE TO FACE EVALUATION WAS PERFORMED  Dailon Sheeran Karis Juba 08/23/2020, 12:49 PM

## 2020-08-23 NOTE — Progress Notes (Signed)
Subjective: Sitting in chair eating lunch on exam.  Pleasant and conversant.  She states her arm is feeling much better.   Objective: Vital signs in last 24 hours: Temp:  [98.1 F (36.7 C)] 98.1 F (36.7 C) (05/18 2120) Pulse Rate:  [65-71] 65 (05/19 0640) Resp:  [16] 16 (05/19 0640) BP: (121-154)/(73-97) 154/97 (05/19 0640) SpO2:  [100 %] 100 % (05/19 0640)  Intake/Output from previous day: 05/18 0701 - 05/19 0700 In: 250 [P.O.:250] Out: -  Intake/Output this shift: No intake/output data recorded.  No results for input(s): HGB in the last 72 hours. No results for input(s): WBC, RBC, HCT, PLT in the last 72 hours. No results for input(s): NA, K, CL, CO2, BUN, CREATININE, GLUCOSE, CALCIUM in the last 72 hours. No results for input(s): LABPT, INR in the last 72 hours.  Intact sensation and capillary refill all fingertips.  She is moving all digits.  Splint was taken down.  Mild tenderness to palpation at the wrist and elbow.  Full pronation.  Nearly full supination.  She can flex and extend at the wrist.  Radiographs: Radiographs taken earlier this week were reviewed.  This shows dorsal angulation 15 degrees at the distal radius.  There is shortening.  There is a radial head fragment on the elbow views with displacement.  This is approximately 15 to 20% of the radial head.   Assessment/Plan: Recommend continued nonoperative treatment and current cognitive level.  She states the wrist is feeling pretty good.  She has good range of motion at the elbow.  Recommend switch to thermoplastic splint.     Betha Loa 08/23/2020, 3:31 PM

## 2020-08-23 NOTE — Progress Notes (Signed)
Physical Therapy Session Note  Patient Details  Name: Danielle Kirby MRN: [id number] Date of Birth: 1945/11/04  Today's Date: 08/23/2020 PT Individual Time: 1300-1345 PT Individual Time Calculation (min): 45 min   Short Term Goals: Week 3:  PT Short Term Goal 1 (Week 3): Patient will perform standing balance dynamic activity with S without UE support. PT Short Term Goal 2 (Week 3): Patient will ambulate 35' with S with LRAD PT Short Term Goal 3 (Week 3): Patient will negotiate 4 steps with rail and S. PT Short Term Goal 4 (Week 3): Patient will demonstrate session to session recall with mod multimodal cues.  Skilled Therapeutic Interventions/Progress Updates: Pt presents sitting in w/c finishing lunch and agreeable to therapy.  Pt wheeled downstairs and out to patio for time conservation.  Pt amb multiple trials on uneven surfaces w/o AD and CGA up to 120'.  Pt amb initially w/ high guard, but then decreased w/ increased distance.  Pt given visual and verbal directions for gait path, but still required repeat directions.  Pt returned to room w/ chair alarm on and all needs in reach.     Therapy Documentation Precautions:  Precautions Precautions: Fall Precaution Comments: RUE sling and sugar tong splint Required Braces or Orthoses: Sling Restrictions Weight Bearing Restrictions: Yes RUE Weight Bearing: Non weight bearing General:   Vital Signs:  Pain:0/10 Pain Assessment Pain Scale: Faces Faces Pain Scale: No hurt Mobility:       Therapy/Group: Individual Therapy  Lucio Edward 08/23/2020, 1:45 PM

## 2020-08-23 NOTE — Progress Notes (Signed)
Occupational Therapy Session Note  Patient Details  Name: Danielle Kirby MRN: [id number] Date of Birth: 1945/06/17  Today's Date: 08/23/2020 OT Individual Time: 7341-9379 OT Individual Time Calculation (min): 45 min    Short Term Goals: Week 3:  OT Short Term Goal 1 (Week 3): Patient will complete 2/3 steps of toileting with min A for advancing past hips. OT Short Term Goal 2 (Week 3): Patient will complete grooming task with no more than 2 cues for task initiation/termination. OT Short Term Goal 3 (Week 3): Patient will complete LBD at no more than min A. OT Short Term Goal 4 (Week 3): Patient will complete UB/LB dressing tasks with mod cues to attend to task.  Skilled Therapeutic Interventions/Progress Updates:    Patient seated edge of bed with nursing.  She denies pain.  She is disoriented and mildly restless at this time.  She presents with confabulation, rapid speech and poor recall/insight.  She eventually agreed to completing oral care, and donning socks and shoes - supervision/cues needed to initiate.  Sit to stand and ambulation with SPC to/from bed and w/c with CGA.  She completed ambulation on unit 2 x 100 feet with CGA.  Returned to w/c with seat belt alarm on at close of session, call bell and tray table in reach.     Therapy Documentation Precautions:  Precautions Precautions: Fall Precaution Comments: RUE sling and sugar tong splint Required Braces or Orthoses: Sling Restrictions Weight Bearing Restrictions: Yes RUE Weight Bearing: Non weight bearing   Therapy/Group: Individual Therapy  [name] 08/23/2020, 7:38 AM

## 2020-08-23 NOTE — Progress Notes (Signed)
Physical Therapy Session Note  Patient Details  Name: Danielle Kirby MRN: [id number] Date of Birth: March 04, 1946  Today's Date: 08/23/2020 PT Individual Time: 1500-1600 PT Individual Time Calculation (min): 60 min   Short Term Goals: Week 3:  PT Short Term Goal 1 (Week 3): Patient will perform standing balance dynamic activity with S without UE support. PT Short Term Goal 2 (Week 3): Patient will ambulate 20' with S with LRAD PT Short Term Goal 3 (Week 3): Patient will negotiate 4 steps with rail and S. PT Short Term Goal 4 (Week 3): Patient will demonstrate session to session recall with mod multimodal cues.  Skilled Therapeutic Interventions/Progress Updates:    Patient in w/c in room with spouse present.  He reports she had tried to call him, but realizing now she needs to dial "9" prior to entering his number.  Patient attempted with his assistance for numbers, but still unable to complete call due to time to enter numbers.  Patient did not work again to complete with success reporting she is not interested in calling him.  Patient sit to stand with S.  Encouraged to take her cane as leaving it in the room.  Patient ambulated to ortho gym over 200' with S carrying cane until encouraged to use it as touching wall rail occasionally.  Patient on Nu Step for L UE and bilat LE at level 1 x 7 minutes with cueing for fluency.    Patient performed side stepping and crossing over in front, then in back with bilateral HHA.  For braiding needed counter support for safety with mod verbal and tactile cues.    Patient standing at [facility] for balance/cognitive activity over 3 minutes x 2 touching up to 5 word sequence with mod cues for waiting each time to touch words after scrambled and for initiating with first word.  She had error each time on last two words getting reversed and perseverated on first sequence that the words were not making sense together needing cues that they are not meant to make sense.   Next performed sequencing of A-Z with S and only one cue for finding letter.  Lastly attempted to sequence number then letter (A-Z; 1-26), but pt fatigued needing seated rest then to finish task in sitting as took 13 minutes to complete with mod progressing to max cues for choosing the number then the letter.    Patient perseverating on being "independent" throughout session and encouraged that her mobility is improving and approaching a level she does not need physical help, but that she does need supervision for safety due to continued memory and processing issues.  She ambulated to room finding room with mod cues as felt she was in room 24 or 23 but informed twice she was in room 13.  Patient left in w/c with alarm belt active and spouse in the room.   Therapy Documentation Precautions:  Precautions Precautions: Fall Precaution Comments: RUE sling and sugar tong splint Required Braces or Orthoses: Sling Restrictions Weight Bearing Restrictions: Yes RUE Weight Bearing: Non weight bearing Pain: Pain Assessment Pain Score: 0-No pain   Therapy/Group: Individual Therapy  Elray Mcgregor  Sheran Lawless, PT 08/23/2020, 4:27 PM

## 2020-08-23 NOTE — Progress Notes (Signed)
Speech Language Pathology Daily Session Note  Patient Details  Name: Danielle Kirby MRN: [id number] Date of Birth: 10-08-1945  Today's Date: 08/23/2020 SLP Individual Time: 4163-8453 SLP Individual Time Calculation (min): 60 min  Short Term Goals: Week 3: SLP Short Term Goal 1 (Week 3): Patient will initiate functional tasks in 50% of opportunities with Mod A verbal cues. SLP Short Term Goal 2 (Week 3): Patient will demonstrate sustained attention to functional tasks for 5-7 minutes with Max verbal cues for redirection. SLP Short Term Goal 3 (Week 3): Patient will demonstrate orientation to place, time, biographical information, diagnoses and recent medical events with Mod A multimodal cues. SLP Short Term Goal 4 (Week 3): Patient will maintain topic of conversation for ~6 turns with Mod A verbal cues. SLP Short Term Goal 5 (Week 3): Pt will understand, recall and utilize memory notebook to increase carryover of functional information and therapeutic events with max A verbal cues  Skilled Therapeutic Interventions: Skilled SLP intervention focused on cognition. Pt seated at foot of bed reading paper when slp entered. She required max redirection to tasks and conversational topics this session regarding rehab. Max A required for pill organization task with BID pill bottle. She increased understanding with visual model and verbal cues to increase accuracy. Pt will need full supervision with this task once home. Cont with therapy per plan of care.      Pain Pain Assessment Pain Scale: Faces Faces Pain Scale: No hurt  Therapy/Group: Individual Therapy  Carlean Jews Vanita Cannell 08/23/2020, 11:43 AM

## 2020-08-24 LAB — CBC
HCT: 36.4 % (ref 36.0–46.0)
Hemoglobin: 11.2 g/dL — ABNORMAL LOW (ref 12.0–15.0)
MCH: 27.1 pg (ref 26.0–34.0)
MCHC: 30.8 g/dL (ref 30.0–36.0)
MCV: 87.9 fL (ref 80.0–100.0)
Platelets: 199 10*3/uL (ref 150–400)
RBC: 4.14 MIL/uL (ref 3.87–5.11)
RDW: 15.2 % (ref 11.5–15.5)
WBC: 4.2 10*3/uL (ref 4.0–10.5)
nRBC: 0 % (ref 0.0–0.2)

## 2020-08-24 NOTE — Progress Notes (Signed)
Occupational Therapy TBI Note  Patient Details  Name: Danielle Kirby MRN: [id number] Date of Birth: 03-19-1946  Today's Date: 08/24/2020 OT Individual Time: 1430-1500 OT Individual Time Calculation (min): 30 min    Short Term Goals: Week 4:  OT Short Term Goal 1 (Week 4): Patient will complete 3/3 toileting tasks with close [facility] consistently. OT Short Term Goal 2 (Week 4): Patient will complete LB dressing with close SPVSN. OT Short Term Goal 3 (Week 4): Patient will complete UB/LB adl task with mod cues to attend to task. OT Short Term Goal 4 (Week 4): Patient will complete grooming task with no more than 1 cue for initiation/termination.  Skilled Therapeutic Interventions/Progress Updates:    Treatment session with focus on attention to task, safety awareness, and orientation to time and situation.  Pt received upright in w/c with [facility] leaving.  Pt noted to be internally and externally distracted throughout session, pt began reorganizing papers on table and bed, constantly moving items throughout session.  Pt disoriented to time, place, situation stating that someone would be picking her up and taking her home tonight.  Therapist redirected pt to written note including situation and upcoming d/c date. Client stating "that's not right".  Pt frequently reaching for phone throughout session, attempting to call her children to pick her up but pt never getting through to anyone.  Therapist provided pt with safety awareness cards, pt was able to identify 5 safety/fall hazards with min cues, requiring mod cues to identify ways to correct safety hazards.  Pt remained upright in w/c with seat belt alarm on and table placed in front or her - per nursing staff Okay to leave up in w/c.  Therapy Documentation Precautions:  Precautions Precautions: Fall Precaution Comments: RUE sling and sugar tong splint Required Braces or Orthoses: Sling Restrictions Weight Bearing Restrictions: Yes RUE Weight  Bearing: Non weight bearing General:   Vital Signs: Therapy Vitals Temp: 98.2 F (36.8 C) Temp Source: Oral Pulse Rate: 80 Resp: 17 BP: 113/65 Patient Position (if appropriate): Sitting Oxygen Therapy SpO2: 100 % O2 Device: Room Air Pain: Pain Assessment Pain Score: 0-No pain Agitated Behavior Scale: TBI Observation Details Observation Environment: pt room Start of observation period - Date: 08/24/20 Start of observation period - Time: 1430 End of observation period - Date: 08/24/20 End of observation period - Time: 1500 Agitated Behavior Scale (DO NOT LEAVE BLANKS) Short attention span, easy distractibility, inability to concentrate: Present to a moderate degree Impulsive, impatient, low tolerance for pain or frustration: Present to a slight degree Uncooperative, resistant to care, demanding: Absent Violent and/or threatening violence toward people or property: Absent Explosive and/or unpredictable anger: Absent Rocking, rubbing, moaning, or other self-stimulating behavior: Present to a slight degree Pulling at tubes, restraints, etc.: Absent Wandering from treatment areas: Absent Restlessness, pacing, excessive movement: Present to a slight degree Repetitive behaviors, motor, and/or verbal: Present to a slight degree Rapid, loud, or excessive talking: Absent Sudden changes of mood: Absent Easily initiated or excessive crying and/or laughter: Absent Self-abusiveness, physical and/or verbal: Absent Agitated behavior scale total score: 20   Therapy/Group: Individual Therapy  Rosalio Loud 08/24/2020, 4:18 PM

## 2020-08-24 NOTE — Progress Notes (Signed)
Occupational Therapy Note  Patient Details  Name: AKELIA HUSTED MRN: [id number] Date of Birth: 08-08-1945  Have noted splint order placed, splint to be fabricated by primary OT on Monday 08/27/2020.   Rosalio Loud 08/24/2020, 4:25 PM

## 2020-08-24 NOTE — Progress Notes (Signed)
Speech Language Pathology Weekly Progress and Session Note  Patient Details  Name: HIND CHESLER MRN: [id number] Date of Birth: 09/09/1945  Beginning of progress report period: Aug 17, 2020 End of progress report period: Aug 24, 2020  Today's Date: 08/24/2020 SLP Individual Time: 1301-1400 SLP Individual Time Calculation (min): 59 min  Short Term Goals: Week 3: SLP Short Term Goal 1 (Week 3): Patient will initiate functional tasks in 50% of opportunities with Mod A verbal cues. SLP Short Term Goal 2 (Week 3): Patient will demonstrate sustained attention to functional tasks for 5-7 minutes with Max verbal cues for redirection. SLP Short Term Goal 2 - Progress (Week 3): Met SLP Short Term Goal 3 (Week 3): Patient will demonstrate orientation to place, time, biographical information, diagnoses and recent medical events with Mod A multimodal cues. SLP Short Term Goal 3 - Progress (Week 3): Met SLP Short Term Goal 4 (Week 3): Patient will maintain topic of conversation for ~6 turns with Mod A verbal cues. SLP Short Term Goal 4 - Progress (Week 3): Met SLP Short Term Goal 5 (Week 3): Pt will understand, recall and utilize memory notebook to increase carryover of functional information and therapeutic events with max A verbal cues SLP Short Term Goal 5 - Progress (Week 3): Met    New Short Term Goals: Week 4: SLP Short Term Goal 1 (Week 4): STG=LTG due to short ELOS (d/c 5/26)  Weekly Progress Updates: Pt made good progress meeting 4 out 4 goals, current requires max A. Pt demonstrated increased orientation or ability to utilize aids. Pt continues to demonstrate poor intellectual awareness and attention impacting, problem solving, topic maintain and short term recall. Education has begun but continued education is recommended. Pt would continue to benefit from skilled ST services in order to maximize functional independence and reduce burden of care, requiring 24 hour supervision and continue ST  services.     Intensity: Minumum of 1-2 x/day, 30 to 90 minutes Frequency: 3 to 5 out of 7 days Duration/Length of Stay: 08/30/20 Treatment/Interventions: Cognitive remediation/compensation;Internal/external aids;Therapeutic Activities;Environmental controls;Cueing hierarchy;Functional tasks;Patient/family education   Daily Session  Skilled Therapeutic Interventions: Skilled ST services focused on education and cognitive skills. Pt's husband was present for treatment session and pt was consuming late lunch tray. Pt became frustrated with SLP, when SLP attempt to engage pt in recall of today's events and questions from [place]. SLP waited till pt finished lunch tray before continuing. Pt demonstrated recall of 1 out 4 OT events from earlier today, with ability to reads event when directed in notebook. SLP facilitated assessment of cognitive skills utilizing SLUMS, pt score 12 out 30 (n=>27) indicated continued severe impairment with poor intellectual awareness and sustained attention, impacting basic problem solving and short term recall. Pt became defensive, therefore SLP provided continuous explanation during treatment session to acute brain injury and current deficits. Pt required max A verbal cues to locate named of medication when given function on medication list (this task was also completed yesterday) fading to mod A verbal cues with increase structure. SLP provided education to pt's husband on current cognitive deficits and need for 24 hour supervision, he agreed. All questions answered to satisfaction. SLP downgrade problem solving and attention goals due to slow progress. Recommend to continue ST services.    General    Pain Pain Assessment Pain Score: 0-No pain  Therapy/Group: Individual Therapy  Lugene Beougher  [facility] 08/24/2020, 4:09 PM

## 2020-08-24 NOTE — Progress Notes (Signed)
Physical Therapy Session Note  Patient Details  Name: Danielle Kirby MRN: [id number] Date of Birth: October 27, 1945  Today's Date: 08/24/2020 PT Individual Time: 1050-1145 PT Individual Time Calculation (min): 55 min   Short Term Goals: Week 3:  PT Short Term Goal 1 (Week 3): Patient will perform standing balance dynamic activity with S without UE support. PT Short Term Goal 2 (Week 3): Patient will ambulate 71' with S with LRAD PT Short Term Goal 3 (Week 3): Patient will negotiate 4 steps with rail and S. PT Short Term Goal 4 (Week 3): Patient will demonstrate session to session recall with mod multimodal cues.  Skilled Therapeutic Interventions/Progress Updates:    Patient in w/c with spouse in the room.  Reports laughing at RN who was just in the room.  Patient S for sit to stand and ambulated with SPC vs no device (at times carrying cane) with close S occasional CGA if distracted.  Patient performed tandem gait on line in hallway with HHA, then toe walking, then heel walking with rail for support.  Patient in parallel bars performed braiding with min cues and UE support with S.  Patient ambulated while kicking bean bags with CGA for work on SLS and coordination with CGA.  Patient ambulated 120' with SPC and slower pace with cues for forward gaze and increased speed.  Patient performed 7 min on Nu Step at level 2 with LE's only for strengthening, cues for increased speed.  Ambulated back to general gym without device with cues for increased speed and foot clearance with occasional CGA.   Patient negotiated 12 steps with L rail and CGA.  Patient ambulated to room with cues as noted and min A x 1 due to LOB catching foot on the floor.  Patient left in w/c with alarm belt active and call bell/needs in reach.  Therapy Documentation Precautions:  Precautions Precautions: Fall Precaution Comments: RUE sling and sugar tong splint Required Braces or Orthoses: Sling Restrictions Weight Bearing  Restrictions: Yes RUE Weight Bearing: Non weight bearing Pain: Pain Assessment Pain Scale: 0-10 Pain Score: 0-No pain   Therapy/Group: Individual Therapy  Elray Mcgregor  Sheran Lawless, PT 08/24/2020, 11:32 AM

## 2020-08-24 NOTE — Plan of Care (Signed)
  Problem: RH Problem Solving Goal: LTG Patient will demonstrate problem solving for (SLP) Description: LTG:  Patient will demonstrate problem solving for basic/complex daily situations with cues  (SLP) Flowsheets (Taken 08/24/2020 1606) LTG Patient will demonstrate problem solving for: Moderate Assistance - Patient 50 - 74% Note: Poor awareness of deficits    Problem: RH Attention Goal: LTG Patient will demonstrate this level of attention during functional activites (SLP) Description: LTG:  Patient will will demonstrate this level of attention during functional activites (SLP) Flowsheets (Taken 08/24/2020 1606) LTG: Patient will demonstrate this level of attention during cognitive/linguistic activities with assistance of (SLP): Moderate Assistance - Patient 50 - 74% Note: Poor awareness of deficits

## 2020-08-24 NOTE — Progress Notes (Signed)
Occupational Therapy Weekly Progress Note  Patient Details  Name: Danielle Kirby MRN: [id number] Date of Birth: 1945/05/20  Beginning of progress report period: Aug 17, 2020 End of progress report period: Aug 24, 2020  Today's Date: 08/24/2020 OT Individual Time: 0832-0930 OT Individual Time Calculation (min): 58 min    Patient has met 3 of 4 short term goals. Patient's mobility has greatly improved for functional transfers and BADLs; however, pt continues to be limited by cognitive deficits including memory, attention, initiation/termination, sequencing, problem solving, and awareness. Some improvements have been noted this week, but have been inconsistent. Mod-max multimodal cues required for safe and successful ADL performance and during simple cognitive tasks. Patient's cognitive deficits remain a limiting factor for safe d/c to home with caregiver.  Patient continues to demonstrate the following deficits: muscle weakness, decreased cardiorespiratoy endurance, decreased coordination, ideational apraxia, decreased initiation, decreased attention, decreased awareness, decreased problem solving, decreased safety awareness, decreased memory and delayed processing and decreased standing balance, decreased balance strategies and difficulty maintaining precautions and therefore will continue to benefit from skilled OT intervention to enhance overall performance with BADL and Reduce care partner burden.  Patient progressing toward long term goals.  Continue plan of care.  OT Short Term Goals Week 3:  OT Short Term Goal 1 (Week 3): Patient will complete 2/3 steps of toileting with min A for advancing past hips. OT Short Term Goal 1 - Progress (Week 3): Met OT Short Term Goal 2 (Week 3): Patient will complete grooming task with no more than 2 cues for task initiation/termination. OT Short Term Goal 2 - Progress (Week 3): Met OT Short Term Goal 3 (Week 3): Patient will complete LBD at no more than  min A. OT Short Term Goal 3 - Progress (Week 3): Met OT Short Term Goal 4 (Week 3): Patient will complete UB/LB dressing tasks with mod cues to attend to task. OT Short Term Goal 4 - Progress (Week 3): Progressing toward goal Week 4:  OT Short Term Goal 1 (Week 4): Patient will complete 3/3 toileting tasks with close [facility] consistently. OT Short Term Goal 2 (Week 4): Patient will complete LB dressing with close SPVSN. OT Short Term Goal 3 (Week 4): Patient will complete UB/LB adl task with mod cues to attend to task. OT Short Term Goal 4 (Week 4): Patient will complete grooming task with no more than 1 cue for initiation/termination.  Skilled Therapeutic Interventions/Progress Updates:    Pt received supine in bed and agreeable to OT session. Pt sup>sit mod I using bed rails. Pt ambulated CGA bed>dresser>shower (~20') to pick out clothes for the day with decreased vc's to correctly choose clothing items. Pt oriented to place following choice of 3, stating "you ask silly questions." Pt's PICC line covered in flexi seal and sugar tong splint covered for showering. Pt showered CGA seated and in stance with ** cueing.  UB/LB dressing CGA with occasional min A to pull underwear/pants over hips. Min vc's for safety awareness during LB dressing. Donned socks distant spvsn seated EOB. Reapplied sugar tong splint/ace wrap on RUE. Pt completed oral hygiene standing at sink CGA (min A to apply toothpaste due to pt's RUE in splint). Noted trunk FLX and mod vc's to stand with upright posture and maintain NWB precautions in RUE throughout task and min vc'sfor recall/task initiation. Pt reporting fatigue following BADLs. Mod A to don tennis shoes to push foot in shoe and tie shoelaces. Pt demo'd fair problem-solving skills to don R  shoe, but then pt provided with elastic shoelaces with improved ease of donning. Pt engaged in ambulation/navigation in hallway task with CGA targeting improved safety and mobility for home  d/c.   Pt left seated in w/c with alarm set, call bell in reach, and all needs met. ABS completed, see flowsheets.  Therapy Documentation Precautions:  Precautions Precautions: Fall Precaution Comments: RUE sling and sugar tong splint Required Braces or Orthoses: Sling Restrictions Weight Bearing Restrictions: Yes RUE Weight Bearing: Non weight bearing Pain:  denies pain   Therapy/Group: Individual Therapy  Mellissa Kohut 08/24/2020, 8:13 AM

## 2020-08-24 NOTE — Progress Notes (Signed)
[facility] PHYSICAL MEDICINE & REHABILITATION PROGRESS NOTE  Subjective/Complaints: Patient seen laying in bed this morning.  She states she slept well overnight.  No reported issues overnight.  Sleep chart not updated.  She was seen by Ortho Kirby, Danielle reviewed- splint change recommended.  ROS: Limited by cognition.   Objective: Vital Signs: Blood pressure Kirby, Danielle Kirby, Danielle 98.2 F (36.8 C), resp. rate 18, SpO2 100 %. No results found. No results for input(s): WBC, HGB, HCT, PLT in the last 72 hours. No results for input(s): NA, K, CL, CO2, GLUCOSE, BUN, CREATININE, CALCIUM in the last 72 hours.  Intake/Output Summary (Last 24 hours) at 08/24/2020 1035 Last data filed at 08/24/2020 0950 Gross per 24 hour  Intake 237 ml  Output --  Net 237 ml        Physical Exam: BP Kirby   Danielle Kirby   Temp 98.2 F (36.8 C)   Resp 18   SpO2 100%   Constitutional: No distress . Vital signs reviewed. HENT: Normocephalic.  Atraumatic. Eyes: EOMI. No discharge. Cardiovascular: No JVD.  RRR. Respiratory: Normal effort.  No stridor.  Bilateral clear to auscultation. GI: Non-distended.  BS +. Skin: Warm and dry.  Intact. Psych: Confused.  Confabulatory. Musc: No edema in extremities.  No tenderness in extremities. Neuro: Alert x oriented x 2 Motor: 5/5 proximal to distal Limited insight and awareness.  Motor: LUE/LLE: 4-4+/5 proximal distal, unchanged RUE: Shoulder abduction 4/5, distally limited by splint, appears unchanged RLE: 4-4+/5 proximal to distal, some fluctuation  Assessment/Plan: 1. Functional deficits which require 3+ hours per day of interdisciplinary therapy in a comprehensive inpatient rehab setting.  Physiatrist is providing close team supervision and 24 hour management of active medical problems listed below.  Physiatrist and rehab team continue to assess barriers to discharge/monitor patient progress toward functional and medical goals   Care  Tool:  Bathing    Body parts bathed by patient: Right arm,Chest,Abdomen,Front perineal area,Buttocks,Right upper leg,Left upper leg,Left lower leg,Face,Right lower leg,Left arm   Body parts bathed by helper: Left arm     Bathing assist Assist Level: Contact Guard/Touching assist     Upper Body Dressing/Undressing Upper body dressing   What is the patient wearing?: Pull over shirt    Upper body assist Assist Level: Supervision/Verbal cueing    Lower Body Dressing/Undressing Lower body dressing      What is the patient wearing?: Pants,Underwear/pull up     Lower body assist Assist for lower body dressing: Minimal Assistance - Patient > 75%     Toileting Toileting    Toileting assist Assist for toileting: Supervision/Verbal cueing     Transfers Chair/bed transfer  Transfers assist     Chair/bed transfer assist level: Contact Guard/Touching assist     Locomotion Ambulation   Ambulation assist   Ambulation activity did not occur: Safety/medical concerns  Assist level: Contact Guard/Touching assist Assistive device: No Device Max distance: 120   Walk 10 feet activity   Assist  Walk 10 feet activity did not occur: Safety/medical concerns  Assist level: Contact Guard/Touching assist Assistive device: No Device   Walk 50 feet activity   Assist Walk 50 feet with 2 turns activity did not occur: Safety/medical concerns  Assist level: Contact Guard/Touching assist Assistive device: No Device    Walk 150 feet activity   Assist Walk 150 feet activity did not occur: Safety/medical concerns  Assist level: Contact Guard/Touching assist Assistive device: No Device    Walk 10 feet on uneven  surface  activity   Assist Walk 10 feet on uneven surfaces activity did not occur: Safety/medical concerns   Assist level: Contact Guard/Touching assist Assistive device: Other (comment) (no AD.)   Wheelchair     Assist Will patient use wheelchair at  discharge?: No Type of Wheelchair: Manual Wheelchair activity did not occur: Safety/medical concerns  Wheelchair assist level: Supervision/Verbal cueing Max wheelchair distance: 120'    Wheelchair 50 feet with 2 turns activity    Assist    Wheelchair 50 feet with 2 turns activity did not occur: Safety/medical concerns   Assist Level: Supervision/Verbal cueing   Wheelchair 150 feet activity     Assist  Wheelchair 150 feet activity did not occur: Safety/medical concerns        Medical Problem List and Plan: 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 Completed 7-day course of Keppra for seizure prophylaxis. Follow-up Dr. Conchita Paris  Continue CIR 2. 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 No current plans for long-term anticoagulation due to risk of fall/SDH.   Repeat vascular study reviewed, appears unchanged, left calf DVT no proximal migration  Plan to repeat next week -antiplatelet therapy: Continue Lovenox 30 mg every 12 hours 3. Pain Management:Continue Naprosyn 250 mg twice daily, Ultram as needed  Appears controlled on 5/19 4. Mood:Provide emotional support -antipsychotic agents: Seroquel 12.5 mg qhs increased to 25 on 5/10 5. Neuropsych: This patientis notcapable of making decisions on herown behalf.  Telesitter for safety 6. Skin/Wound Care:Routine skin checks 7. Fluids/Electrolytes/Nutrition:Routine in and outs  CMP within acceptable range on 5/16 8. Right maxillary sinus, right lateral orbital wall and floor fractures with fat herniation. Follow-up outpatient Dr Leta Baptist. No surgical intervention planned 9. 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 Follow-up as outpatient. 10. Hypertension.   Norvasc changed to 5 BID on 5/19 due to overnight elevations. Vitals:   08/23/20 1947 08/24/20 0641  BP: (!) 110/95 Kirby  Danielle: 86 Kirby  Resp: 18   Temp: 98.2  F (36.8 C)   SpO2: 100% 100%   Relatively controlled on 5/20 11. Incidental finding of abnormal appearing endometrium identified on MRI lumbar spine. Follow-up outpatient OB/GYN 12. Right distal comminuted distal radius fracture and minimally displaced radial head fracture. No surgical intervention at this time follow-up outpatient Dr. Merlyn Lot.  -Nonweightbearing with splintRUE, evaluated by Ortho again-recommending nonoperative management with change in splint. -encourage pt to keep splint in place 13.  Acute blood loss anemia  Hemoglobin 10.2 on 5/9, labs not drawn, will order again  Continue to monitor 14. Sleep disturbance  Seroquel ordered  Sleep chart ordered  Appears to be improving overall  LOS: 22 days A FACE TO FACE EVALUATION WAS PERFORMED  Lyncoln Maskell Karis Juba 08/24/2020, 10:35 AM

## 2020-08-25 NOTE — Progress Notes (Signed)
Physical Therapy Session Note  Patient Details  Name: Danielle Kirby MRN: [id number] Date of Birth: 14-Oct-1945  Today's Date: 08/25/2020 PT Individual Time: 1520-1543 PT Individual Time Calculation (min): 23 min   Short Term Goals: Week 3:  PT Short Term Goal 1 (Week 3): Patient will perform standing balance dynamic activity with S without UE support. PT Short Term Goal 2 (Week 3): Patient will ambulate 53' with S with LRAD PT Short Term Goal 3 (Week 3): Patient will negotiate 4 steps with rail and S. PT Short Term Goal 4 (Week 3): Patient will demonstrate session to session recall with mod multimodal cues.  Skilled Therapeutic Interventions/Progress Updates:   Pt received sitting in WC and agreeable to PT. Pt transported to day room in [employer] Gait training with SPC x 189ft with supervision assist-CGA with cues for attention to task, improved use of SPC, and safety awareness to navigate obstacles in hall.   Dynamic standing balance while engaged in gross motor cognitive task of wii bowling. Pt able to perform with supervision assist to maintain balance in self selected semitandem position x 8 frames. Pt then requested seated rest break due to BLE fatigue. Patient returned to room and left sitting in [facility] with call bell in reach and all needs met.         Therapy Documentation Precautions:  Precautions Precautions: Fall Precaution Comments: RUE sling and sugar tong splint Required Braces or Orthoses: Sling Restrictions Weight Bearing Restrictions: Yes RUE Weight Bearing: Non weight bearing    Vital Signs: Therapy Vitals Temp: 98.1 F (36.7 C) Temp Source: Oral Pulse Rate: 80 Resp: 17 BP: 122/69 Patient Position (if appropriate): Lying Oxygen Therapy SpO2: 100 % O2 Device: Room Air Pain: denies   Therapy/Group: Individual Therapy  Lorie Phenix 08/25/2020, 3:42 PM

## 2020-08-25 NOTE — Progress Notes (Signed)
Occupational Therapy Session Note  Patient Details  Name: FAITHANN NATAL MRN: [id number] Date of Birth: [birth year]  Today's Date: 08/25/2020 OT Individual Time: 1430-1511 OT Individual Time Calculation (min): 41 min    Short Term Goals: Week 3:  OT Short Term Goal 1 (Week 3): Patient will complete 2/3 steps of toileting with min A for advancing past hips. OT Short Term Goal 1 - Progress (Week 3): Met OT Short Term Goal 2 (Week 3): Patient will complete grooming task with no more than 2 cues for task initiation/termination. OT Short Term Goal 2 - Progress (Week 3): Met OT Short Term Goal 3 (Week 3): Patient will complete LBD at no more than min A. OT Short Term Goal 3 - Progress (Week 3): Met OT Short Term Goal 4 (Week 3): Patient will complete UB/LB dressing tasks with mod cues to attend to task. OT Short Term Goal 4 - Progress (Week 3): Progressing toward goal  Skilled Therapeutic Interventions/Progress Updates:    Pt greeted semi-reclined in bed with significant other present. Worked on functional ambulation to Chief of Staff from [employer]. Pt needed moderate cues for safety awareness within dressing tasks as pt trying to step in and out of underwear and frequently forgetting that she had already collected clean clothes to put on. Functional ambulation to gym with min HHA. Pt reported need to go to the bathroom. Pt ambulated back to the bathroom and sat on commode. Pt with successful void of bladder and completed 3/3 toileting steps with close supervision. Pt washed hands at the sink with supervision and left seated in wc with alarm belt on, call bell in reach, and needs met.   Therapy Documentation Precautions:  Precautions Precautions: Fall Precaution Comments: RUE sling and sugar tong splint Required Braces or Orthoses: Sling Restrictions Weight Bearing Restrictions: Yes RUE Weight Bearing: Non weight bearing Pain:   denies pain  Therapy/Group: Individual Therapy  Valma Cava 08/25/2020, 3:12 PM

## 2020-08-25 NOTE — Progress Notes (Signed)
Patient slept the majority of shift w/o discomfort or acute distress, respiration unlabored on RA,colorationadequate. Continue sleep chart , slept approximately 9.5 hours/12 hr shift, Denies pain or discomfort. Continue Tele- monitoring for safety

## 2020-08-26 NOTE — Progress Notes (Signed)
[place] PHYSICAL MEDICINE & REHABILITATION PROGRESS NOTE  Subjective/Complaints: Patient seen laying in bed this morning.  She states she slept well overnight.  Sleep charting complete.  She states she feels cold.  She is more alert this morning.  ROS: Limited by cognition.  Objective: Vital Signs: Blood pressure (!) 141/70, pulse 79, temperature 98.3 F (36.8 C), temperature source Oral, resp. rate 14, SpO2 100 %. No results found. Recent Labs    08/24/20 1225  WBC 4.2  HGB 11.2*  HCT 36.4  PLT 199   No results for input(s): NA, K, CL, CO2, GLUCOSE, BUN, CREATININE, CALCIUM in the last 72 hours.  Intake/Output Summary (Last 24 hours) at 08/26/2020 0854 Last data filed at 08/26/2020 0743 Gross per 24 hour  Intake 1320 ml  Output --  Net 1320 ml        Physical Exam: BP (!) 141/70 (BP Location: Left Arm)   Pulse 79   Temp 98.3 F (36.8 C) (Oral)   Resp 14   SpO2 100%   Constitutional: No distress . Vital signs reviewed. HENT: Normocephalic.  Atraumatic. Eyes: EOMI. No discharge. Cardiovascular: No JVD.  RRR. Respiratory: Normal effort.  No stridor.  Bilateral clear to auscultation. GI: Non-distended.  BS +. Skin: Warm and dry.  Intact. Psych: Confused.  Confabulatory. Neuro: Alert x oriented x 3 Motor: 5/5 proximal to distal Limited insight and awareness.  Motor: LUE/LLE: 4-4+/5 proximal distal, unchanged RUE: Shoulder abduction 4/5, distally limited by splint, appears unchanged RLE: 4-4+/5 proximal to distal, stable  Assessment/Plan: 1. Functional deficits which require 3+ hours per day of interdisciplinary therapy in a comprehensive inpatient rehab setting.  Physiatrist is providing close team supervision and 24 hour management of active medical problems listed below.  Physiatrist and rehab team continue to assess barriers to discharge/monitor patient progress toward functional and medical goals   Care Tool:  Bathing    Body parts bathed by  patient: Right arm,Chest,Abdomen,Front perineal area,Buttocks,Right upper leg,Left upper leg,Left lower leg,Face,Right lower leg,Left arm   Body parts bathed by helper: Left arm     Bathing assist Assist Level: Contact Guard/Touching assist     Upper Body Dressing/Undressing Upper body dressing   What is the patient wearing?: Pull over shirt    Upper body assist Assist Level: Supervision/Verbal cueing    Lower Body Dressing/Undressing Lower body dressing      What is the patient wearing?: Pants,Underwear/pull up     Lower body assist Assist for lower body dressing: Minimal Assistance - Patient > 75%     Toileting Toileting    Toileting assist Assist for toileting: Supervision/Verbal cueing     Transfers Chair/bed transfer  Transfers assist     Chair/bed transfer assist level: Contact Guard/Touching assist     Locomotion Ambulation   Ambulation assist   Ambulation activity did not occur: Safety/medical concerns  Assist level: Contact Guard/Touching assist Assistive device: No Device Max distance: 120   Walk 10 feet activity   Assist  Walk 10 feet activity did not occur: Safety/medical concerns  Assist level: Contact Guard/Touching assist Assistive device: No Device   Walk 50 feet activity   Assist Walk 50 feet with 2 turns activity did not occur: Safety/medical concerns  Assist level: Contact Guard/Touching assist Assistive device: No Device    Walk 150 feet activity   Assist Walk 150 feet activity did not occur: Safety/medical concerns  Assist level: Contact Guard/Touching assist Assistive device: No Device    Walk 10 feet on  uneven surface  activity   Assist Walk 10 feet on uneven surfaces activity did not occur: Safety/medical concerns   Assist level: Contact Guard/Touching assist Assistive device: Other (comment) (no AD.)   Wheelchair     Assist Will patient use wheelchair at discharge?: No Type of Wheelchair:  Manual Wheelchair activity did not occur: Safety/medical concerns  Wheelchair assist level: Supervision/Verbal cueing Max wheelchair distance: 120'    Wheelchair 50 feet with 2 turns activity    Assist    Wheelchair 50 feet with 2 turns activity did not occur: Safety/medical concerns   Assist Level: Supervision/Verbal cueing   Wheelchair 150 feet activity     Assist  Wheelchair 150 feet activity did not occur: Safety/medical concerns        Medical Problem List and Plan: 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 Completed 7-day course of Keppra for seizure prophylaxis. Follow-up Dr. Conchita Paris  Continue CIR 2. 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 No current plans for long-term anticoagulation due to risk of fall/SDH.   Repeat vascular study reviewed, appears unchanged, left calf DVT no proximal migration  Plan to repeat this week -antiplatelet therapy: Continue Lovenox 30 mg every 12 hours 3. Pain Management:Continue Naprosyn 250 mg twice daily, Ultram as needed  Appears controlled on 5/22 4. Mood:Provide emotional support -antipsychotic agents: Seroquel 12.5 mg qhs increased to 25 on 5/10 5. Neuropsych: This patientis notcapable of making decisions on herown behalf.  Telesitter for safety 6. Skin/Wound Care:Routine skin checks 7. Fluids/Electrolytes/Nutrition:Routine in and outs  CMP within acceptable range on 5/16, BMP ordered for tomorrow 8. Right maxillary sinus, right lateral orbital wall and floor fractures with fat herniation. Follow-up outpatient Dr Leta Baptist. No surgical intervention planned 9. 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 Follow-up as outpatient. 10. Hypertension.   Norvasc changed to 5 BID on 5/19 due to overnight elevations. Vitals:   08/25/20 2009 08/26/20 0833  BP: 125/78 (!) 141/70  Pulse: 88 79  Resp: 14   Temp: 98.3 F (36.8  C)   SpO2: 100%    Relatively controlled on 5/22 11. Incidental finding of abnormal appearing endometrium identified on MRI lumbar spine. Follow-up outpatient OB/GYN 12. Right distal comminuted distal radius fracture and minimally displaced radial head fracture. No surgical intervention at this time follow-up outpatient Dr. Merlyn Lot.  -Nonweightbearing with splintRUE, evaluated by Ortho again-recommending nonoperative management with change in splint. -encourage pt to keep splint in place 13.  Acute blood loss anemia  Hemoglobin 11.2 on 5/20  Continue to monitor 14. Sleep disturbance  Seroquel ordered  Sleep chart ordered  Appears to be improving overall  LOS: 24 days A FACE TO FACE EVALUATION WAS PERFORMED  Iyla Balzarini Karis Juba 08/26/2020, 8:54 AM

## 2020-08-26 NOTE — Progress Notes (Signed)
Patient refused lovenox this morning. Education provided.

## 2020-08-26 NOTE — Progress Notes (Signed)
Pt continues to be impulsive, with increased confusion noted as day progresses. Difficult to redirect and reorient at times. Telesitter in place, will continue to monitor.

## 2020-08-27 LAB — BASIC METABOLIC PANEL
Anion gap: 6 (ref 5–15)
BUN: 11 mg/dL (ref 8–23)
CO2: 28 mmol/L (ref 22–32)
Calcium: 9.6 mg/dL (ref 8.9–10.3)
Chloride: 106 mmol/L (ref 98–111)
Creatinine, Ser: 0.82 mg/dL (ref 0.44–1.00)
GFR, Estimated: >60 mL/min (ref >60)
Glucose, Bld: 102 mg/dL — ABNORMAL HIGH (ref 70–99)
Potassium: 4.1 mmol/L (ref 3.5–5.1)
Sodium: 140 mmol/L (ref 135–145)

## 2020-08-27 MED ORDER — NAPROXEN 250 MG PO TABS
250.0000 mg | ORAL_TABLET | Freq: Two times a day (BID) | ORAL | Status: DC | PRN
  Filled 2020-08-27: qty 1

## 2020-08-27 NOTE — Progress Notes (Signed)
Speech Language Pathology Daily Session Note  Patient Details  Name: Danielle Kirby MRN: [id number] Date of Birth: 03-09-1946  Today's Date: 08/27/2020 SLP Individual Time: 5597-4163 SLP Individual Time Calculation (min): 45 min  Short Term Goals: Week 4: SLP Short Term Goal 1 (Week 4): STG=LTG due to short ELOS (d/c 5/26)  Skilled Therapeutic Interventions:Skilled SLP intervention focused on cognition. Significant other present during session. Pt completed medication management task with pill organizer. She was able to sort pills with mod A for understanding am/pm and pill slots. Educated Danielle Kirby on assistance that patient will need to complete this task to eliminate errors. Improved orientation this session. Pt was able to state day of the week month and year independently. She stated date when shown calendar and given min verbal cues to assist with reasoning date. Cont with therapy per plan of care.      Pain Pain Assessment Pain Scale: Faces Pain Score: 0-No pain  Therapy/Group: Individual Therapy  Carlean Jews Amayrani Bennick 08/27/2020, 10:27 AM

## 2020-08-27 NOTE — Progress Notes (Signed)
Occupational Therapy TBI Note  Patient Details  Name: Danielle Kirby MRN: [id number] Date of Birth: 1945/04/30  Today's Date: 08/27/2020 OT Individual Time: 6767-2094   &   7096-2836 OT Individual Time Calculation (min): 45 min    &    75 min   Short Term Goals: Week 4:  OT Short Term Goal 1 (Week 4): Patient will complete 3/3 toileting tasks with close [facility] consistently. OT Short Term Goal 2 (Week 4): Patient will complete LB dressing with close SPVSN. OT Short Term Goal 3 (Week 4): Patient will complete UB/LB adl task with mod cues to attend to task. OT Short Term Goal 4 (Week 4): Patient will complete grooming task with no more than 1 cue for initiation/termination.  Skilled Therapeutic Interventions/Progress Updates:    Session 1: Pt received sitting in recliner having finished breakfast, agreeable to OT session. Pt engaged in discussion of new orders for splint in PM session. Pt agreeable to shower; sit<>stand and ambulation with single point cane CGA. UB/LB dressing close spvsn seated and in stance. Pt perseveration throughout session on having new underwear. Pt showered close spvsn with increased independence in using hand held shower head to rinse; min vc's for initiation of washing other body parts. Pt collected clothes from low drawer with no LOB. Pt completed oral hygiene seated at sink with no vc's for task initiation/termination. Pt continues to demo confusion, occasional confabulation, and requires min-mod multimodal cues for safety, memory, and attention. Pt left seated in recliner, alarm set, call bell in reach, and all needs met.  Session 2: Pt received seated in recliner with significant other present and agreeable to OT session. Pt ambulated without AE CG/CS >200 feet x2 and sat unsupported on therapy mat for ~45 minutes during activity. Fabricated new clam shell wrist support splint per MD orders. Splint used in therapeutic activities with no signs of skin irritation or  discomfort. Pt and significant other education on wearing schedule and splint care instructions; provided sign placed above [facility] for other staff on splint use.  Skin checked without redness/irritation.  Pt engaged in dynamic standing task using the BITS targeting reaching (with left hand) outside BOS with no overt LOB at CGA level. Included cognitive tasks of sequencing the alphabet and numbers 1-15, short term memory, and visual scanning with minimal difficulties. Alphabet sequencing A-Z improved from previous trial demoing 72% accuracy (with some minor user errors), 1 min 30 seconds to complete, and a 3.46 sec reaction time. Pt continues with difficulty with abstract task completion. Pt demo'd good safety awareness during safety questions (what to do regarding certain fire safety hazards) with min vc's for recall of calling 9-1-1 in an emergency. Pt demo'd improved recall of events in session when writing in memory notebook. Pt left seated in recliner, alarm set, call bell in reach, and all needs met with significant other back in room.  Therapy Documentation Precautions:  Precautions Precautions: Fall Precaution Comments: RUE sling and sugar tong splint Required Braces or Orthoses: Sling Restrictions Weight Bearing Restrictions: Yes RUE Weight Bearing: Non weight bearing Pain: Pain Assessment Pain Scale: 0-10 Pain Score: 0-No pain Agitated Behavior Scale: TBI Observation Details Observation Environment: pt room Start of observation period - Date: 08/27/20 Start of observation period - Time: 0802 End of observation period - Date: 08/27/20 End of observation period - Time: 0845 Agitated Behavior Scale (DO NOT LEAVE BLANKS) Short attention span, easy distractibility, inability to concentrate: Present to a slight degree Impulsive, impatient, low tolerance for  pain or frustration: Absent Uncooperative, resistant to care, demanding: Absent Violent and/or threatening violence toward people or  property: Absent Explosive and/or unpredictable anger: Absent Rocking, rubbing, moaning, or other self-stimulating behavior: Absent Pulling at tubes, restraints, etc.: Absent Wandering from treatment areas: Absent Restlessness, pacing, excessive movement: Absent Repetitive behaviors, motor, and/or verbal: Present to a slight degree Rapid, loud, or excessive talking: Absent Sudden changes of mood: Absent Easily initiated or excessive crying and/or laughter: Absent Self-abusiveness, physical and/or verbal: Absent Agitated behavior scale total score: 16  *ABS for session 2 in flow sheets.  Therapy/Group: Individual Therapy  Mellissa Kohut 08/27/2020, 8:09 AM

## 2020-08-27 NOTE — Progress Notes (Signed)
[place] PHYSICAL MEDICINE & REHABILITATION PROGRESS NOTE  Subjective/Complaints: Vitals stable Moving bowels regularly for her Sleeping very well Denies pain  ROS: Denies pain  Objective: Vital Signs: Blood pressure 121/66, pulse 78, temperature 98.5 F (36.9 C), temperature source Oral, resp. rate 18, SpO2 99 %. No results found. Recent Labs    08/24/20 1225  WBC 4.2  HGB 11.2*  HCT 36.4  PLT 199   Recent Labs    08/27/20 0652  NA 140  K 4.1  CL 106  CO2 28  GLUCOSE 102*  BUN 11  CREATININE 0.82  CALCIUM 9.6    Intake/Output Summary (Last 24 hours) at 08/27/2020 0857 Last data filed at 08/26/2020 1335 Gross per 24 hour  Intake 360 ml  Output --  Net 360 ml        Physical Exam: BP 121/66 (BP Location: Left Arm)   Pulse 78   Temp 98.5 F (36.9 C) (Oral)   Resp 18   SpO2 99%   Gen: no distress, normal appearing HEENT: oral mucosa pink and moist, NCAT Cardio: Reg rate Chest: normal effort, normal rate of breathing Abd: soft, non-distended Ext: no edema Psych: Confused.  Confabulatory. Neuro: Alert x oriented x 3 Motor: 5/5 proximal to distal Limited insight and awareness.  Motor: LUE/LLE: 4-4+/5 proximal distal, unchanged RUE: Shoulder abduction 4/5, distally limited by splint, appears unchanged RLE: 4-4+/5 proximal to distal, stable  Assessment/Plan: 1. Functional deficits which require 3+ hours per day of interdisciplinary therapy in a comprehensive inpatient rehab setting.  Physiatrist is providing close team supervision and 24 hour management of active medical problems listed below.  Physiatrist and rehab team continue to assess barriers to discharge/monitor patient progress toward functional and medical goals   Care Tool:  Bathing    Body parts bathed by patient: Right arm,Chest,Abdomen,Front perineal area,Buttocks,Right upper leg,Left upper leg,Left lower leg,Face,Right lower leg,Left arm   Body parts bathed by helper: Left arm      Bathing assist Assist Level: Contact Guard/Touching assist     Upper Body Dressing/Undressing Upper body dressing   What is the patient wearing?: Pull over shirt    Upper body assist Assist Level: Supervision/Verbal cueing    Lower Body Dressing/Undressing Lower body dressing      What is the patient wearing?: Pants,Underwear/pull up     Lower body assist Assist for lower body dressing: Minimal Assistance - Patient > 75%     Toileting Toileting    Toileting assist Assist for toileting: Supervision/Verbal cueing     Transfers Chair/bed transfer  Transfers assist     Chair/bed transfer assist level: Contact Guard/Touching assist     Locomotion Ambulation   Ambulation assist   Ambulation activity did not occur: Safety/medical concerns  Assist level: Contact Guard/Touching assist Assistive device: No Device Max distance: 120   Walk 10 feet activity   Assist  Walk 10 feet activity did not occur: Safety/medical concerns  Assist level: Contact Guard/Touching assist Assistive device: No Device   Walk 50 feet activity   Assist Walk 50 feet with 2 turns activity did not occur: Safety/medical concerns  Assist level: Contact Guard/Touching assist Assistive device: No Device    Walk 150 feet activity   Assist Walk 150 feet activity did not occur: Safety/medical concerns  Assist level: Contact Guard/Touching assist Assistive device: No Device    Walk 10 feet on uneven surface  activity   Assist Walk 10 feet on uneven surfaces activity did not occur: Safety/medical concerns  Assist level: Contact Guard/Touching assist Assistive device: Other (comment) (no AD.)   Wheelchair     Assist Will patient use wheelchair at discharge?: No Type of Wheelchair: Manual Wheelchair activity did not occur: Safety/medical concerns  Wheelchair assist level: Supervision/Verbal cueing Max wheelchair distance: 120'    Wheelchair 50 feet with 2 turns  activity    Assist    Wheelchair 50 feet with 2 turns activity did not occur: Safety/medical concerns   Assist Level: Supervision/Verbal cueing   Wheelchair 150 feet activity     Assist  Wheelchair 150 feet activity did not occur: Safety/medical concerns        Medical Problem List and Plan: 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 Completed 7-day course of Keppra for seizure prophylaxis. Follow-up Dr. Conchita Paris  Continue CIR 2. 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 No current plans for long-term anticoagulation due to risk of fall/SDH.  Repeat vascular study reviewed, appears unchanged, left calf DVT no proximal migration Plan to repeat this week -antiplatelet therapy: Continue Lovenox 30 mg every 12 hours 3. Pain Management:Changed Naprosyn 250 mg to twice daily PRN since she denies pain, Ultram as needed  Appears controlled on 5/23 4. Mood:Provide emotional support -antipsychotic agents: Seroquel 12.5 mg qhs increased to 25 on 5/10 5. Neuropsych: This patientis notcapable of making decisions on herown behalf.  Telesitter for safety 6. Skin/Wound Care:Routine skin checks 7. Fluids/Electrolytes/Nutrition:Routine in and outs  CMP within acceptable range on 5/16, BMP ordered for tomorrow 8. Right maxillary sinus, right lateral orbital wall and floor fractures with fat herniation. Follow-up outpatient Dr Leta Baptist. No surgical intervention planned 9. 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 Follow-up as outpatient. 10. Hypertension.   Norvasc changed to 5 BID on 5/19 due to overnight elevations. Vitals:   08/26/20 1938 08/27/20 0625  BP: 113/82 121/66  Pulse: 96 78  Resp: 18 18  Temp: 98.3 F (36.8 C) 98.5 F (36.9 C)  SpO2: 100% 99%   Relatively controlled on 5/23, continue current regimen 11. Incidental finding of abnormal appearing endometrium identified  on MRI lumbar spine. Follow-up outpatient OB/GYN 12. Right distal comminuted distal radius fracture and minimally displaced radial head fracture. No surgical intervention at this time follow-up outpatient Dr. Merlyn Lot.  -Nonweightbearing with splintRUE, evaluated by Ortho again-recommending nonoperative management with change in splint. -encourage pt to keep splint in place 13.  Acute blood loss anemia  Hemoglobin 11.2 on 5/20  Continue to monitor 14. Sleep disturbance  Seroquel ordered  Sleep chart ordered  Appears to be improving overall  LOS: 25 days A FACE TO FACE EVALUATION WAS PERFORMED  Drema Pry Danielle Kirby 08/27/2020, 8:57 AM

## 2020-08-27 NOTE — Progress Notes (Signed)
Physical Therapy Weekly Progress Note  Patient Details  Name: Danielle Kirby MRN: [id number] Date of Birth: 09-27-1945  Beginning of progress report period: Aug 20, 2020 End of progress report period: Aug 27, 2020  Today's Date: 08/27/2020 PT Individual Time: 5625-6389 PT Individual Time Calculation (min): 45 min   Patient has met 3 of 4 short term goals.  Patient progressing well with mobility and slow but progress with cognition.  She still needs CGA for safety on steps, but ambulates for the most part with S, though inconsistent with using cane and occasional veering with walking in hallway needing cues for safety.  Plans for caregiver education next session and continued work for d/c later this week.   Patient continues to demonstrate the following deficits muscle weakness and decreased initiation, decreased attention, decreased awareness, decreased problem solving, decreased safety awareness and decreased memory and therefore will continue to benefit from skilled PT intervention to increase functional independence with mobility.  Patient progressing toward long term goals..  Continue plan of care.  PT Short Term Goals Week 3:  PT Short Term Goal 1 (Week 3): Patient will perform standing balance dynamic activity with S without UE support. PT Short Term Goal 1 - Progress (Week 3): Met PT Short Term Goal 2 (Week 3): Patient will ambulate 32' with S with LRAD PT Short Term Goal 2 - Progress (Week 3): Met PT Short Term Goal 3 (Week 3): Patient will negotiate 4 steps with rail and S. PT Short Term Goal 3 - Progress (Week 3): Progressing toward goal PT Short Term Goal 4 (Week 3): Patient will demonstrate session to session recall with mod multimodal cues. PT Short Term Goal 4 - Progress (Week 3): Met Week 4:  PT Short Term Goal 1 (Week 4): STG=LTG due to ELOS  Skilled Therapeutic Interventions/Progress Updates:    Patient in recliner in room with spouse present.  Recalled with min cues  taking shower and dressing in earlier session and reports grandaughter due to come fix her hair.  Patient sit to stand with S and ambulated with S and occasional SPC use.  Cues for using cane more consistently throughout session due to some instability she called attention to with veering in hallway.  Patient performed standing dynamic balance touching letters A-Z on BITS x 3 trials with S.  Patient at rail in hallway for side and diagonal stepping for coordination and balance 1 UE support on rail.  Performed sit<>supine on bed in ADL apartment with S and transfers to couch and recliner with S after discussion of sitting near armrest on couch. Patient in kitchen choosing items in pantry for snack, lunch and breakfast correctly.  Patient obtained cup and filled with water.  TUG and TUG manual performed as noted below without device.  Patient ambulated to dayroom x 150' with S and occasional cane use with cues.  Performed Kinetron x 3 minutes @ 40 cm/sec.  Ambulated with S room with mod cues for finding room.  Did recall spouse had gone to home improvement store.  Left in recliner with call bell/needs in reach and seat belt alarm active, telesitter in the room.   Therapy Documentation Precautions:  Precautions Precautions: Fall Precaution Comments: RUE sling and sugar tong splint Required Braces or Orthoses: Sling Restrictions Weight Bearing Restrictions: Yes RUE Weight Bearing: Non weight bearing Pain: Pain Assessment Pain Scale: Faces Pain Score: 0-No pain  Balance: Standardized Balance Assessment Standardized Balance Assessment: Timed Up and Go Test Timed Up and Go  Test TUG: Normal TUG;Manual TUG Normal TUG (seconds): 21.5 Manual TUG (seconds): 23.7  Therapy/Group: Individual Therapy  Reginia Naas  [place], PT 08/27/2020, 8:22 AM

## 2020-08-27 NOTE — Progress Notes (Signed)
Patient refused her lovenox at 6am. Moved her lovenox to 730 am in case she agrees later. Also, didn't want to get up to the bathroom to void. Per patient, she will go later.

## 2020-08-28 ENCOUNTER — Ambulatory Visit (HOSPITAL_COMMUNITY): Payer: Federal, State, Local not specified - PPO | Attending: Physical Medicine & Rehabilitation

## 2020-08-28 DIAGNOSIS — I82402 Acute embolism and thrombosis of unspecified deep veins of left lower extremity: Secondary | ICD-10-CM | POA: Diagnosis not present

## 2020-08-28 DIAGNOSIS — R609 Edema, unspecified: Secondary | ICD-10-CM | POA: Insufficient documentation

## 2020-08-28 NOTE — Progress Notes (Signed)
Physical Therapy Session Note  Patient Details  Name: Danielle Kirby MRN: [id number] Date of Birth: 1946/01/02  Today's Date: 08/28/2020 PT Individual Time: 1100-1200 PT Individual Time Calculation (min): 60 min   Short Term Goals: Week 4:  PT Short Term Goal 1 (Week 4): STG=LTG due to ELOS  Skilled Therapeutic Interventions/Progress Updates:    Patient seated EOB in room with spouse present and OT finishing caregiver education.  Patient reports already ambulated with OT, but not too fatigued.  Patient's spouse, Danielle Kirby present for caregiver education and discussed patient's level of assistance for mobility, but also needing constant supervision due to cognitive deficits with decreased safety awareness and memory.  Patient ambulated 250' with SPC with close S and cues for direction to ortho gym.  Performed car transfer to simulated mid size SUV with running board as Danielle Kirby reports patient to ride home in his vehicle.  Patient performed with close S and mod cues for safety for hand placement, cane management.  Danielle Kirby able to return demonstrate safe handling of pt with appropriate cues and level of assistance.  Patient ambulated to general gym with [facility] and close S with [employer] providing appropriate level of assistance and cues.  Patient negotiated 4 steps with R rail and CGA side stepping as spouse reports stairs to second level of split level home with R rail only.  Able to descend using L hand on rail forwards with step to technique and CGA to close S.  Patient negotiated up with L rail and side stepping down with L rail as steps to basement level of home with L rail only, performed with CGA/S.  Patient negotiated final time 4 steps with L rail to ascend and R rail to descend as steps leading into home with bilateral rails.  Danielle Kirby demonstrated appropriate level of assistance for stairs throughout.  Patient in ADL aparment performed transfer to rocker recliner and to kitchen chair with S and cues with spouse  providing cues and assistance.  Patient at counter in kitchen performed side stepping with L hand on counter with CGA, then for crossing over and tandem gait and backward gait.  Demonstrated to spouse and discussed plans for HEP to include balane and coordination activities.  Patient ambulated to room and needed spouse S and mod cues for wayfinding.  Patient assisted to recliner and alarm belt activated.  Discussed with spouse need for monitoring also at night and spouse reported plans with security motion detectors for safety.  Patient left in room with spouse present.   Therapy Documentation Precautions:  Precautions Precautions: Fall Precaution Comments: RUE sling and sugar tong splint Required Braces or Orthoses: Sling Restrictions Weight Bearing Restrictions: Yes RUE Weight Bearing: Non weight bearing Pain: Pain Assessment Pain Score: 0-No pain    Therapy/Group: Individual Therapy  Elray Mcgregor  Sheran Lawless, PT 08/28/2020, 4:41 PM

## 2020-08-28 NOTE — Progress Notes (Signed)
Speech Language Pathology Daily Session Note  Patient Details  Name: Danielle Kirby MRN: [id number] Date of Birth: Mar 17, 1946  Today's Date: 08/28/2020 SLP Individual Time: 0900-0950 SLP Individual Time Calculation (min): 50 min  Short Term Goals: Week 4: SLP Short Term Goal 1 (Week 4): STG=LTG due to short ELOS (d/c 5/26)  Skilled Therapeutic Interventions: Skilled SLP intervention focused on cognition. Friend present during session for family education. Educated friend on pt requiring mod assistance with functional cognitive tasks due to decreased attention. Decreased memory, and problem solving skills. Pt completed calendar organization task with mod a verbal and visual cues for direction during task to complete each step without getting distracted. She required increased time and repetition of written information. Friend demonstrated good understanding that patient with need moderate assistance with tasks once home. Pt demonstrates improvement in awareness of physical limitations and is more accepting of recommendations given by SLP for safety. Cont with therapy per plan of care. Pt being dc'd home 5/26.      Pain Pain Assessment Pain Scale: Faces Pain Score: 0-No pain Faces Pain Scale: No hurt  Therapy/Group: Individual Therapy  Danielle Kirby Danielle Kirby 08/28/2020, 9:58 AM

## 2020-08-28 NOTE — Progress Notes (Signed)
Lower extremity venous LT study completed.   Please see CV Proc for preliminary results.   Jaleea Alesi, RDMS, RVT  

## 2020-08-28 NOTE — Discharge Summary (Addendum)
Physician Discharge Summary  Patient ID: Danielle Kirby MRN: [id number] DOB/AGE: Aug 12, 1945 75 y.o.  Admit date: 08/02/2020 Discharge date: 08/30/2020  Discharge Diagnoses:  Principal Problem:   TBI (traumatic brain injury) [facility]) Active Problems:   Radial head fracture   Acute blood loss anemia   Essential hypertension   Traumatic injury of head with altered mental status   Post-operative pain   Benign essential HTN   Deep venous thrombosis (DVT) of left peroneal vein (HCC)   Sleep disturbance Left ICA stenosis Incidental finding of abnormal appearing endometrium  Discharged Condition: Stable  Significant Diagnostic Studies: DG ELBOW COMPLETE RIGHT (3+VIEW)  Result Date: 08/21/2020 CLINICAL DATA:  Elbow injury EXAM: RIGHT ELBOW - COMPLETE 3+ VIEW COMPARISON:  08/03/2020 trauma CT 07/26/2020 FINDINGS: Casting material limits bone detail. 8 mm capitellar fracture fragment similar in position along the posterolateral aspect of the capitellum. No change in alignment and progressive healing of coronoid process fracture. Slight increased displacement of intra-articular radial head neck fracture fragment with some callus formation but readily visible fracture lucencies. IMPRESSION: 1. Stable alignment of 8 mm capitellar fracture fragment. Stable alignment and progressive healing of coronoid process fracture 2. Slight increased displacement of radial head neck fracture fragment with some periosteal new bone formation but readily apparent fracture lucencies. Electronically Signed   By: Jasmine Pang M.D.   On: 08/21/2020 20:31   DG ELBOW COMPLETE RIGHT (3+VIEW)  Result Date: 08/03/2020 CLINICAL DATA:  Fracture EXAM: RIGHT ELBOW - COMPLETE 3+ VIEW COMPARISON:  Elbow CT 07/26/2020 FINDINGS: Unchanged alignment of the radial head and neck fracture including the articular surface splint. Unchanged small capitellar fracture with small displaced fragment. Unchanged alignment of the coronoid process  fracture. IMPRESSION: Unchanged alignment of the radial head/neck, capitellum, and coronoid process fractures Electronically Signed   By: Caprice Renshaw   On: 08/03/2020 15:46   DG Wrist 2 Views Right  Result Date: 08/03/2020 CLINICAL DATA:  Fracture EXAM: RIGHT WRIST - 2 VIEW COMPARISON:  CT 07/26/2020 FINDINGS: There is impacted dorsally angulated distal radial fracture with intra-articular extension and a mildly displaced ulnar styloid fracture unchanged in alignment interval casting. IMPRESSION: Impacted, comminuted intra-articular fracture of the distal radius with dorsal angulation and mild displaced ulnar styloid fracture unchanged in alignment with interval casting. Electronically Signed   By: Caprice Renshaw   On: 08/03/2020 15:41   DG Wrist Complete Right  Result Date: 08/21/2020 CLINICAL DATA:  Wrist fracture EXAM: RIGHT WRIST - COMPLETE 3+ VIEW COMPARISON:  08/03/2020, 07/26/2020 FINDINGS: Casting material limits bone detail. Similar appearance and alignment of displaced ulnar styloid process fracture. Impacted intra-articular distal radius fracture with slight increased dorsal displacement compared with 08/03/2020. Interval callus formation consistent with healing response though with fracture lines faintly visible. IMPRESSION: 1. No change in alignment of ulnar styloid process fracture 2. Slight increased dorsal displacement of comminuted intra-articular distal radius fracture though with some interval callus formation Electronically Signed   By: Jasmine Pang M.D.   On: 08/21/2020 20:27   DG Knee Complete 4 Views Left  Result Date: 07/30/2020 CLINICAL DATA:  Left knee pain.  Multiple falls. EXAM: LEFT KNEE - COMPLETE 4+ VIEW COMPARISON:  None. FINDINGS: No evidence of fracture or dislocation. Mild medial compartment joint space narrowing. Mild tricompartmental peripheral spurring. Chondrocalcinosis. Small to moderate joint effusion. Soft tissues are unremarkable. IMPRESSION: 1. Mild  tricompartmental osteoarthritis and chondrocalcinosis. 2. Small to moderate joint effusion. 3. No fracture. Electronically Signed   By: Ivette Loyal.D.  On: 07/30/2020 17:53   VAS [place] LOWER EXTREMITY VENOUS (DVT)  Result Date: 08/28/2020  Lower Venous DVT Study Patient Name:  Danielle Kirby  Date of Exam:   08/28/2020 Medical Rec #: [id number]        Accession #:    [id number] Date of Birth: [birth year]         Patient Gender: F Patient Age:   33Y Exam Location:  [facility] Procedure:      VAS [place] LOWER EXTREMITY VENOUS (DVT) Referring Phys: [id number] Malia Corsi ANIL Letina Luckett --------------------------------------------------------------------------------  Indications: Follow up DVT LT Pero V.  Comparison Study: 08-17-2020 Most recent prior LT lower venous showed age                   indeterminate DVT of the LT peroneal veins. Performing Technologist: Jean Rosenthal RDMS,RVT  Examination Guidelines: A complete evaluation includes B-mode imaging, spectral Doppler, color Doppler, and power Doppler as needed of all accessible portions of each vessel. Bilateral testing is considered an integral part of a complete examination. Limited examinations for reoccurring indications may be performed as noted. The reflux portion of the exam is performed with the patient in reverse Trendelenburg.  +-----+---------------+---------+-----------+----------+--------------+ RIGHTCompressibilityPhasicitySpontaneityPropertiesThrombus Aging +-----+---------------+---------+-----------+----------+--------------+ CFV  Full           Yes      Yes                                 +-----+---------------+---------+-----------+----------+--------------+   +---------+---------------+---------+-----------+----------+--------------+ LEFT     CompressibilityPhasicitySpontaneityPropertiesThrombus Aging +---------+---------------+---------+-----------+----------+--------------+ CFV      Full           Yes      Yes                                  +---------+---------------+---------+-----------+----------+--------------+ SFJ      Full                                                        +---------+---------------+---------+-----------+----------+--------------+ FV Prox  Full                                                        +---------+---------------+---------+-----------+----------+--------------+ FV Mid   Full                                                        +---------+---------------+---------+-----------+----------+--------------+ FV DistalFull                                                        +---------+---------------+---------+-----------+----------+--------------+ PFV      Full                                                        +---------+---------------+---------+-----------+----------+--------------+  POP      Full           Yes      Yes                                 +---------+---------------+---------+-----------+----------+--------------+ PTV      Full                                                        +---------+---------------+---------+-----------+----------+--------------+ PERO     Full           Yes      Yes                                 +---------+---------------+---------+-----------+----------+--------------+ Gastroc  Full                                                        +---------+---------------+---------+-----------+----------+--------------+    Summary: RIGHT: - No evidence of common femoral vein obstruction.  LEFT: - Findings appear improved from previous examination. - There is no evidence of deep vein thrombosis in the lower extremity.  - No cystic structure found in the popliteal fossa.  *See table(s) above for measurements and observations. Electronically signed by Waverly Ferrari MD on 08/28/2020 at 5:36:29 PM.    Final    VAS [place] LOWER EXTREMITY VENOUS (DVT)  Result Date: 08/18/2020  Lower Venous DVT  Study Patient Name:  Danielle Kirby  Date of Exam:   08/17/2020 Medical Rec #: [id number]        Accession #:    [id number] Date of Birth: 05-16-1945         Patient Gender: F Patient Age:   54Y Exam Location:  [facility] Procedure:      VAS [place] LOWER EXTREMITY VENOUS (DVT) Referring Phys: [id number] Jamale Spangler ANIL Jalan Bodi --------------------------------------------------------------------------------  Indications: Follow up LT peroneal DVT.  Anticoagulation: Contraindicated. Comparison Study: 08-04-2020 Most recent bilateral lower extremity venous showed                   partial, age indeterminate DVT involving the left peroneal                   veins. Performing Technologist: Jean Rosenthal RDMS,RVT  Examination Guidelines: A complete evaluation includes B-mode imaging, spectral Doppler, color Doppler, and power Doppler as needed of all accessible portions of each vessel. Bilateral testing is considered an integral part of a complete examination. Limited examinations for reoccurring indications may be performed as noted. The reflux portion of the exam is performed with the patient in reverse Trendelenburg.  +-----+---------------+---------+-----------+----------+--------------+ RIGHTCompressibilityPhasicitySpontaneityPropertiesThrombus Aging +-----+---------------+---------+-----------+----------+--------------+ CFV  Full           Yes      Yes                                 +-----+---------------+---------+-----------+----------+--------------+   +---------+---------------+---------+-----------+----------+-----------------+ LEFT     CompressibilityPhasicitySpontaneityPropertiesThrombus Aging    +---------+---------------+---------+-----------+----------+-----------------+ CFV  Full           Yes      Yes                                    +---------+---------------+---------+-----------+----------+-----------------+ SFJ      Full                                                            +---------+---------------+---------+-----------+----------+-----------------+ FV Prox  Full                                                           +---------+---------------+---------+-----------+----------+-----------------+ FV Mid   Full                                                           +---------+---------------+---------+-----------+----------+-----------------+ FV DistalFull                                                           +---------+---------------+---------+-----------+----------+-----------------+ PFV      Full                                                           +---------+---------------+---------+-----------+----------+-----------------+ POP      Full           Yes      Yes                                    +---------+---------------+---------+-----------+----------+-----------------+ PTV      Full                                                           +---------+---------------+---------+-----------+----------+-----------------+ PERO     Partial        Yes      Yes                  Age Indeterminate +---------+---------------+---------+-----------+----------+-----------------+    Summary: RIGHT: - No evidence of common femoral vein obstruction.  LEFT: - Findings consistent with age indeterminate deep vein thrombosis involving the left peroneal veins. - Findings appear essentially unchanged compared to previous examination. - A cystic structure is found in the popliteal fossa.  *See table(s) above for measurements and observations. Electronically signed by  Lemar Livings MD on 08/18/2020 at 11:06:52 AM.    Final    VAS [place] LOWER EXTREMITY VENOUS (DVT)  Result Date: 08/04/2020  Lower Venous DVT Study Patient Name:  Danielle Kirby  Date of Exam:   08/04/2020 Medical Rec #: [id number]        Accession #:    [id number] Date of Birth: 1945-09-07         Patient Gender: F Patient Age:   64Y Exam Location:  [facility] Procedure:      VAS [place] LOWER EXTREMITY VENOUS (DVT) Referring Phys: [id number] DANIEL J ANGIULLI --------------------------------------------------------------------------------  Indications: Edema, and Swelling.  Comparison Study: 07/30/20 previous- lt pero dvt Performing Technologist: Blanch Media RVS  Examination Guidelines: A complete evaluation includes B-mode imaging, spectral Doppler, color Doppler, and power Doppler as needed of all accessible portions of each vessel. Bilateral testing is considered an integral part of a complete examination. Limited examinations for reoccurring indications may be performed as noted. The reflux portion of the exam is performed with the patient in reverse Trendelenburg.  +---------+---------------+---------+-----------+----------+--------------+ RIGHT    CompressibilityPhasicitySpontaneityPropertiesThrombus Aging +---------+---------------+---------+-----------+----------+--------------+ CFV      Full           Yes      Yes                                 +---------+---------------+---------+-----------+----------+--------------+ SFJ      Full                                                        +---------+---------------+---------+-----------+----------+--------------+ FV Prox  Full                                                        +---------+---------------+---------+-----------+----------+--------------+ FV Mid   Full                                                        +---------+---------------+---------+-----------+----------+--------------+ FV DistalFull                                                        +---------+---------------+---------+-----------+----------+--------------+ PFV      Full                                                        +---------+---------------+---------+-----------+----------+--------------+ POP      Full           Yes      Yes                                  +---------+---------------+---------+-----------+----------+--------------+  PTV      Full                                                        +---------+---------------+---------+-----------+----------+--------------+ PERO     Full                                                        +---------+---------------+---------+-----------+----------+--------------+   +---------+---------------+---------+-----------+----------+-----------------+ LEFT     CompressibilityPhasicitySpontaneityPropertiesThrombus Aging    +---------+---------------+---------+-----------+----------+-----------------+ CFV      Full           Yes      Yes                                    +---------+---------------+---------+-----------+----------+-----------------+ SFJ      Full                                                           +---------+---------------+---------+-----------+----------+-----------------+ FV Prox  Full                                                           +---------+---------------+---------+-----------+----------+-----------------+ FV Mid   Full                                                           +---------+---------------+---------+-----------+----------+-----------------+ FV DistalFull                                                           +---------+---------------+---------+-----------+----------+-----------------+ PFV      Full                                                           +---------+---------------+---------+-----------+----------+-----------------+ POP      Full           Yes      Yes                                    +---------+---------------+---------+-----------+----------+-----------------+ PTV      Full                                                           +---------+---------------+---------+-----------+----------+-----------------+  PERO     Partial                                      Age  Indeterminate +---------+---------------+---------+-----------+----------+-----------------+     Summary: RIGHT: - There is no evidence of deep vein thrombosis in the lower extremity.  - No cystic structure found in the popliteal fossa.  LEFT: - Findings consistent with age indeterminate deep vein thrombosis involving the left peroneal veins. - A cystic structure is found in the popliteal fossa.  *See table(s) above for measurements and observations. Electronically signed by Coral ElseVance Brabham MD on 08/04/2020 at 4:11:24 PM.    Final    VAS US LOWER EXTREMITY VENOUS (DVT)  Result Date: 07/31/2020  Lower Venous DVT Study Patient Name:  Danielle Kirby  Date of Exam:   07/30/2020 Medical Rec #: [id number]        Accession #:    [id number] Date of Birth: 02/23/1946         Patient Gender: F Patient Age:   [age]Y [facility] Procedure:      VAS US LOWER EXTREMITY VENOUS (DVT) Referring Phys: [id number] KELLY OSBORNE --------------------------------------------------------------------------------  Indications: New swelling RT knee. Other Indications: S/P fall 07-26-2020. Limitations: Patient unable to cooperate with repositioning. Comparison Study: 07-31-2017 Prior LT lower extremity venous was negative for                   DVT. 06-04-2020 Prior RT lower extremity venous study. Performing Technologist: Jean Rosenthalachel Hodge RDMS,RVT  Examination Guidelines: A complete evaluation includes B-mode imaging, spectral Doppler, color Doppler, and power Doppler as needed of all accessible portions of each vessel. Bilateral testing is considered an integral part of a complete examination. Limited examinations for reoccurring indications may be performed as noted. The reflux portion of the exam is performed with the patient in reverse Trendelenburg.  +-----+---------------+---------+-----------+----------+--------------+ RIGHTCompressibilityPhasicitySpontaneityPropertiesThrombus Aging  +-----+---------------+---------+-----------+----------+--------------+ CFV  Full           Yes      Yes                                 +-----+---------------+---------+-----------+----------+--------------+   +---------+---------------+---------+-----------+----------+-----------------+ LEFT     CompressibilityPhasicitySpontaneityPropertiesThrombus Aging    +---------+---------------+---------+-----------+----------+-----------------+ CFV      Full           Yes      Yes                                    +---------+---------------+---------+-----------+----------+-----------------+ SFJ      Full                                                           +---------+---------------+---------+-----------+----------+-----------------+ FV Prox  Full                                                           +---------+---------------+---------+-----------+----------+-----------------+ FV  Mid   Full                                                           +---------+---------------+---------+-----------+----------+-----------------+ FV DistalFull                                                           +---------+---------------+---------+-----------+----------+-----------------+ PFV      Full                                                           +---------+---------------+---------+-----------+----------+-----------------+ POP      Full           Yes      Yes                                    +---------+---------------+---------+-----------+----------+-----------------+ PTV      Full                                                           +---------+---------------+---------+-----------+----------+-----------------+ PERO     Partial        Yes      Yes                  Age Indeterminate +---------+---------------+---------+-----------+----------+-----------------+     Summary: RIGHT: - No evidence of common femoral vein obstruction.   LEFT: - Findings consistent with age indeterminate deep vein thrombosis involving the left peroneal veins. - A cystic structure measuring 4.9 cm is found in the popliteal fossa.  *See table(s) above for measurements and observations. Electronically signed by Waverly Ferrari MD on 07/31/2020 at 7:31:22 AM.    Final     Labs:  Basic Metabolic Panel: Recent Labs  Lab 08/27/20 0652  NA 140  K 4.1  CL 106  CO2 28  GLUCOSE 102*  BUN 11  CREATININE 0.82  CALCIUM 9.6    CBC: Recent Labs  Lab 08/24/20 1225  WBC 4.2  HGB 11.2*  HCT 36.4  MCV 87.9  PLT 199    CBG: No results for input(s): GLUCAP in the last 168 hours.  Family history.  Mother with diabetes Father with hypertension.  Denies any colon cancer esophageal cancer or rectal cancer  Brief HPI:   ENSLEE BIBBINS is a 75 y.o. right-handed female with unremarkable past medical history except cataracts and osteoarthritis.  Per chart review lives with significant other.  Independent prior to admission.  Two-level home 6 steps to entry.  Presented 07/26/2020 after reports of multiple falls.  The first fall was several days prior to admission where she fell down 4-6 stairs she then fell again in the bathroom found by significant other unresponsive.  Admission chemistries unremarkable except potassium 3.3 creatinine 1.08 alcohol negative.  Cranial CT scan showed a small right thalamocapsular hypodensity possibly representing age-indeterminate lacunar infarction.  Small 3 mm largely low-density left cerebral convexity subdural collection suspicious for subdural hematoma or hygroma likely in part chronic.  Right periorbital and premaxillary soft tissue contusion.  Fractures of walls of the right maxillary sinus with layering hemosinus.  Additional fractures of the right lateral orbital wall and orbital floor with herniation small amount of fat through the orbital floor defect and involvement of the infraorbital foramen.  CT angiogram head  neck no emergent large vessel occlusion or hemodynamically significant proximal stenosis intracranially.  Approximately 50% stenosis of the proximal left ICA secondary to predominantly calcified atherosclerosis.  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  MRI of the brain showed no acute infarction.  Numerous small foci of susceptibility artifact within the posterior right temporal and right parietal occipital region without associated edema or hyperdensity compatible with prior microhemorrhages.  MRI lumbar spine showed mild multilevel degenerative change as well as incidental abnormal appearance of the endometrium partially imaged but suspicious for possible cancer and patient was to follow-up outpatient OB/GYN.  CT of the right wrist elbow showed comminuted mildly impacted right intra-articular fracture of the distal radius minimally displaced ulnar styloid fracture.  Follow-up neurosurgery regards to small amount of subdural blood as well as incidental left supraclinoid ICA aneurysm follow-up outpatient Dr. Conchita Paris.  Dr. Merlyn Lot follow-up for comminuted distal radial fracture minimally displaced radial head fracture recommend sugar-tong splint follow-up 1 week there was some discussion of possible ORIF of distal radius.  Follow-up Dr Leta Baptist in regards to facial fractures nonoperative follow-up outpatient.  Therapy evaluations completed right upper extremity nonweightbearing, right upper extremity sling with a sugar-tong splint.  Venous Doppler studies 07/30/2020 showed age-indeterminate DVT left peroneal vein.  Given recurrent falls/SDH no current medications for anticoagulation advised follow-up vascular study.  She was maintained on Lovenox.  Maintained on Keppra for seizure prophylaxis.  Patient did have episodes of agitation restlessness requiring Haldol.  Therapy evaluations completed and patient was admitted for a comprehensive rehab  program.   Hospital Course: Danielle Kirby was admitted to rehab 08/02/2020 for inpatient therapies to consist of PT, ST and OT at least three hours five days a week. Past admission physiatrist, therapy team and rehab RN have worked together to provide customized collaborative inpatient rehab.  Pertaining to patient's TBI/SDH secondary to multiple falls she did complete a 7-day course of Keppra for seizure prophylaxis she would follow-up neurosurgery Dr. Conchita Paris.  Hospital course DVT age-indeterminate left peroneal vein identified on venous Dopplers no current plan for anticoagulation due to SDH repeat vascular study 08/28/2020 showed no evidence of deep vein thrombosis in the lower extremity no cystic structure found in the popliteal fossa..  Patient had been maintained on Lovenox.  Pain managed with use of Naprosyn as needed as well as Ultram.  Mood stabilization with Seroquel as well as follow-up neuropsychology.  Right maxillary sinus right lateral orbital wall and floor fractures follow-up outpatient.  No surgical intervention.  Left ICA stenosis noted during work-up follow-up outpatient.  Blood pressure controlled on low-dose Norvasc.  Incidental findings of abnormal appearing endometrium identified on MRI lumbar spine follow-up outpatient OB/GYN.  Right distal comminuted distal radius fracture minimally displaced right radial head fracture no surgical intervention nonweightbearing with splint follow-up outpatient orthopedic services.   Blood pressures were monitored on TID basis and  controlled     Rehab course: During patient's stay in rehab weekly team conferences were held to monitor patient's progress, set goals and discuss barriers to discharge. At admission, patient required moderate assist ambulation 4 feet max assist supine to sit and sit to supine minimal assist eating mod assist upper body bathing max assist upper body dressing max assist lower body dressing moderate assist toilet  transfers  Physical exam.  Blood pressure 148/78 pulse 96 temperature 99 respirations 18 oxygen saturation 97% room air Constitutional.  No acute distress HEENT Head.  Normocephalic and atraumatic Eyes.  Pupils round and reactive to light no discharge without nystagmus Neck.  Supple nontender no JVD without thyromegaly Cardiac regular rate rhythm without extra sounds or murmur heard Abdomen.  Soft nontender positive bowel sounds without rebound Respiratory effort normal no respiratory distress without wheeze Musculoskeletal.  Right wrist with splint some swelling in digits of right hand Neurologic.  Alert no acute distress makes eye contact with examiner.  Oriented to self and [facility].  She did not know why she was here.  Limited insight and awareness.  Occasional confabulation.  Speech generally clear.  Right upper extremity limited by splint/Ortho, able to grip with fingers.  Left upper extremity 4/5.  Right lower extremity 3/5 proximal to 4/5 distal.  Left lower extremity 2/5 proximal to 4/5 ankle dorsi plantarflexion.  Senses pain in all fours.  He/She  has had improvement in activity tolerance, balance, postural control as well as ability to compensate for deficits. He/She has had improvement in functional use RUE/LUE  and RLE/LLE as well as improvement in awareness.  Patient progressing well still needs contact-guard for safety on steps ambulates for the most part supervision with cues for safety.  Patient sit to stand with supervision ambulates supervision with occasional straight point cane use.  Again cues for safety.  Ambulates in the hallway with therapies.  Perform sit to supine on bed in the ADL apartment with supervision.  Patient in kitchen choosing items in pantry for snack lunch and breakfast correctly.  Sit to stand for showers ambulation to the shower contact-guard assist.  Gather his belongings for activities day living homemaking needing some cues.  Patient completed  medication management task with a pill organizer.  She was able to sort pills with moderate assist for understanding a.m./p.m. and pill slots.  Full family teaching completed plan discharged to home       Disposition: Discharged to home    Diet: Regular  Special Instructions: No driving smoking or alcohol  Nonweightbearing right upper extremity with splint  Follow-up outpatient OB/GYN for incidental finding of abnormal appearing endometrium  Medications at discharge. 1.  Tylenol as needed 2.  Norvasc 5 mg p.o. twice daily 3.  Colace 100 mg p.o. twice daily 4.  Melatonin 1.5 mg p.o. nightly 5.  Multivitamin daily 6.  Naprosyn 250 mg p.o. twice daily as needed 7.  MiraLAX as needed constipation 8.  Seroquel 25 mg p.o. nightly 9.  Tramadol 50 mg p.o. every 8 hours as needed pain  30-35 minutes were spent completing discharge summary and discharge planning  Discharge Instructions     Ambulatory referral to Physical Medicine Rehab   Complete by: As directed    Moderate complexity follow-up 1 to 2 weeks TBI/SDH        Follow-up Information     Betha Loa, MD Follow up.   Specialty: Orthopedic Surgery Why: Call for appointment Contact information: [address] [facility] [place]  [address] [phone]         Lisbeth Renshaw, MD Follow up.   Specialty: Neurosurgery Why: Call for appointment Contact information: [address] [place] [address] [phone]         Glenna Fellows, MD Follow up.   Specialty: Plastic Surgery Why: Call for appointment Contact information: [address] [place] [address] [phone]         Marcello Fennel, MD Follow up.   Specialty: Physical Medicine and Rehabilitation Why: office to call for appointment Contact information: [address] [place] [address] [place] [address] [phone]                 Signed: Charlton Amor 08/29/2020, 5:08 AM Patient  was seen, face-face, and physical exam performed by me on day of discharge, greater than 30 minutes of total time spent.. Please see progress note from day of discharge as well.  Maryla Morrow, MD, ABPMR

## 2020-08-28 NOTE — Progress Notes (Signed)
Patient ID: Danielle Kirby, female   DOB: [birth year], 75 y.o.   MRN: [id number]  Therapy recommends outpatient PT/OT and DME: SPC, 3in1 BSC andTTB. SW to discuss with pt.   SW met with pt and pt s/o Danielle Kirby in room to discuss above recommendations. SW to order all DME, if he is able to find 3in1 BSC, SW will cancel order. Outpatient therapies to be sent to [facility].   Loralee Pacas, MSW, [place] Office: [phone] Cell: [phone] Fax: [phone]

## 2020-08-28 NOTE — Progress Notes (Signed)
Occupational Therapy Session Note  Patient Details  Name: Danielle Kirby MRN: [id number] Date of Birth: 01-Mar-1946  Today's Date: 08/28/2020 OT Individual Time: 1000-1100 OT Individual Time Calculation (min): 60 min    Short Term Goals: Week 4:  OT Short Term Goal 1 (Week 4): Patient will complete 3/3 toileting tasks with close [facility] consistently. OT Short Term Goal 2 (Week 4): Patient will complete LB dressing with close SPVSN. OT Short Term Goal 3 (Week 4): Patient will complete UB/LB adl task with mod cues to attend to task. OT Short Term Goal 4 (Week 4): Patient will complete grooming task with no more than 1 cue for initiation/termination.  Skilled Therapeutic Interventions/Progress Updates:    Patient seated in recliner, significant other, [name], present for education session.    Patient denies pain.  Splint checked - she states that it is comfortable, no redness or irritation.  Completed ambulation on unit with SPC, [name] with good carryover of guarding/positioning at CS/CG level.  Reviewed options for shower/bathing - patient took baths in tub prior to injury.  Attempted to complete transfer down to tub surface, she was able to sit on edge of tub but did not feel comfortable getting further down to tub.  Sit to stand from edge of tub surface with min/mod A.  Patient and [name] agreed to continue with showers using shower bench for the time being.  She is able to complete tub/shower transfer with CS.  Returned to room and completed shower with CS, min cues.  Dressing completed seated bed level with CS, min cues for safety.  Reviewed safety considerations, techniques to promote return to home setting and routine activities - patient demonstrates inconsistent but improving insight and safety.  [name] demonstrates good carryover and safety t/o session.  Patient seated at edge of bed at close of session for ongoing PT at this time.    Therapy Documentation Precautions:  Precautions Precautions:  Fall Precaution Comments: RUE sling and sugar tong splint Required Braces or Orthoses: Sling Restrictions Weight Bearing Restrictions: Yes RUE Weight Bearing: Non weight bearing  Therapy/Group: Individual Therapy  [name] 08/28/2020, 7:28 AM

## 2020-08-28 NOTE — Progress Notes (Signed)
Danielle Kirby  Kirby: Danielle Kirby seen sitting up in bed this morning.  Sitter at bedside.  Telemetry sitter present as well.  Danielle Kirby states Danielle Kirby slept well overnight.  No reported issues overnight.  Sleep chart incomplete.  Danielle Kirby does not have a splint done.  Educated and assisted Danielle Kirby to don splint.  ROS: Unreliable due to cognition  Objective: Vital Signs: Blood pressure 120/67, pulse 74, temperature 98.6 F (37 C), temperature source Oral, resp. rate 20, SpO2 100 %. No results found. No results for input(s): WBC, HGB, HCT, PLT in the last 72 hours. Recent Labs    08/27/20 0652  NA 140  K 4.1  CL 106  CO2 28  GLUCOSE 102*  BUN 11  CREATININE 0.82  CALCIUM 9.6    Intake/Output Summary (Last 24 hours) at 08/28/2020 1224 Last data filed at 08/28/2020 0900 Gross per 24 hour  Intake 480 ml  Output --  Net 480 ml        Physical Exam: BP 120/67 (BP Location: Left Arm)   Pulse 74   Temp 98.6 F (37 C) (Oral)   Resp 20   SpO2 100%   Constitutional: No distress . Vital signs reviewed. HENT: Normocephalic.  Atraumatic. Eyes: EOMI. No discharge. Cardiovascular: No JVD.  RRR. Respiratory: Normal effort.  No stridor.  Bilateral clear to auscultation. GI: Non-distended.  BS +. Skin: Warm and dry.  Intact. Psych: Confused, some improvement.  Confabulatory. Musc: No edema in extremities.  No tenderness in extremities. Neuro: Alert x oriented x 3 with increased time and cues Motor: 5/5 proximal to distal Limited insight and awareness, unchanged.  Motor: LUE/LLE: 4-4+/5 proximal distal, unchanged RUE: Shoulder abduction 4/5, distally limited by splint, appears unchanged RLE: 4-4+/5 proximal to distal, stable  Assessment/Plan: 1. Functional deficits which require 3+ hours per day of interdisciplinary therapy in a comprehensive inpatient rehab setting.  Physiatrist is providing close team supervision and 24 hour  management of active medical problems listed below.  Physiatrist and rehab team continue to assess barriers to discharge/monitor Danielle Kirby progress toward functional and medical goals   Care Tool:  Bathing    Body parts bathed by Danielle Kirby: Right arm,Chest,Abdomen,Front perineal area,Buttocks,Right upper leg,Left upper leg,Left lower leg,Face,Right lower leg,Left arm   Body parts bathed by helper: Left arm     Bathing assist Assist Level: Supervision/Verbal cueing     Upper Body Dressing/Undressing Upper body dressing   What is the Danielle Kirby wearing?: Pull over shirt    Upper body assist Assist Level: Supervision/Verbal cueing    Lower Body Dressing/Undressing Lower body dressing      What is the Danielle Kirby wearing?: Pants,Underwear/pull up     Lower body assist Assist for lower body dressing: Supervision/Verbal cueing     Toileting Toileting    Toileting assist Assist for toileting: Supervision/Verbal cueing     Transfers Chair/bed transfer  Transfers assist     Chair/bed transfer assist level: Supervision/Verbal cueing     Locomotion Ambulation   Ambulation assist   Ambulation activity did not occur: Safety/medical concerns  Assist level: Supervision/Verbal cueing Assistive device: Cane-straight Max distance: 150   Walk 10 feet activity   Assist  Walk 10 feet activity did not occur: Safety/medical concerns  Assist level: Supervision/Verbal cueing Assistive device: Cane-straight   Walk 50 feet activity   Assist Walk 50 feet with 2 turns activity did not occur: Safety/medical concerns  Assist level: Supervision/Verbal cueing Assistive device: Cane-straight    Walk  150 feet activity   Assist Walk 150 feet activity did not occur: Safety/medical concerns  Assist level: Supervision/Verbal cueing Assistive device: Cane-straight    Walk 10 feet on uneven surface  activity   Assist Walk 10 feet on uneven surfaces activity did not occur:  Safety/medical concerns   Assist level: Contact Guard/Touching assist Assistive device: Other (comment) (no AD.)   Wheelchair     Assist Will Danielle Kirby use wheelchair at discharge?: No Type of Wheelchair: Manual Wheelchair activity did not occur: Safety/medical concerns  Wheelchair assist level: Supervision/Verbal cueing Max wheelchair distance: 120'    Wheelchair 50 feet with 2 turns activity    Assist    Wheelchair 50 feet with 2 turns activity did not occur: Safety/medical concerns   Assist Level: Supervision/Verbal cueing   Wheelchair 150 feet activity     Assist  Wheelchair 150 feet activity did not occur: Safety/medical concerns        Medical Problem List and Plan: 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 Completed 7-day course of Keppra for seizure prophylaxis. Follow-up Dr. Conchita Paris  Continue CIR 2. 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 No current plans for long-term anticoagulation due to risk of fall/SDH.  Repeat vascular study reviewed, appears unchanged, left calf DVT no proximal migration Repeat Dopplers ordered -antiplatelet therapy: Continue Lovenox 30 mg every 12 hours 3. Pain Management:Changed Naprosyn 250 mg to twice daily PRN since Danielle Kirby denies pain, Ultram as needed  Appears controlled on 5/23 4. Mood:Provide emotional support -antipsychotic agents: Seroquel 12.5 mg qhs increased to 25 on 5/10 5. Neuropsych: This patientis notcapable of making decisions on herown behalf.  Telesitter for safety 6. Skin/Wound Care:Routine skin checks 7. Fluids/Electrolytes/Nutrition:Routine in and outs  CMP within acceptable range on 5/23 8. Right maxillary sinus, right lateral orbital wall and floor fractures with fat herniation. Follow-up outpatient Dr Leta Baptist. No surgical intervention planned 9. 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 Follow-up  as outpatient. 10. Hypertension.   Norvasc changed to 5 BID on 5/19 due to overnight elevations. Vitals:   08/27/20 1922 08/28/20 0631  BP: (!) 106/45 120/67  Pulse: 86 74  Resp: 20 20  Temp: 98.6 F (37 C) 98.6 F (37 C)  SpO2: 100% 100%   Relatively controlled on 5/24 11. Incidental finding of abnormal appearing endometrium identified on MRI lumbar spine. Follow-up outpatient OB/GYN 12. Right distal comminuted distal radius fracture and minimally displaced radial head fracture. No surgical intervention at this time follow-up outpatient Dr. Merlyn Lot.  -Nonweightbearing with splintRUE, evaluated by Ortho again-recommending nonoperative management with change in splint. -encourage pt to keep splint in place, reminded 13.  Acute blood loss anemia  Hemoglobin 11.2 on 5/20  Continue to monitor 14. Sleep disturbance  Seroquel ordered  Sleep chart ordered  Appears to be improving overall  LOS: 26 days A FACE TO FACE EVALUATION WAS PERFORMED  Matisyn Cabeza Karis Juba 08/28/2020, 12:24 PM

## 2020-08-29 ENCOUNTER — Other Ambulatory Visit (HOSPITAL_COMMUNITY): Payer: Self-pay

## 2020-08-29 MED ORDER — QUETIAPINE FUMARATE 25 MG PO TABS
25.0000 mg | ORAL_TABLET | Freq: Every day | ORAL | 0 refills | Status: DC
  Filled 2020-08-29: qty 30, 30d supply, fill #0

## 2020-08-29 MED ORDER — NAPROXEN 500 MG PO TABS
250.0000 mg | ORAL_TABLET | Freq: Two times a day (BID) | ORAL | 0 refills | Status: DC | PRN
  Filled 2020-08-29: qty 10, 10d supply, fill #0

## 2020-08-29 MED ORDER — ADULT MULTIVITAMIN W/MINERALS CH: 1.0000 | ORAL_TABLET | Freq: Every day | ORAL | Status: AC

## 2020-08-29 MED ORDER — TRAMADOL HCL 50 MG PO TABS
50.0000 mg | ORAL_TABLET | Freq: Three times a day (TID) | ORAL | 0 refills | Status: DC | PRN
  Filled 2020-08-29: qty 21, 7d supply, fill #0

## 2020-08-29 MED ORDER — MELATONIN 3 MG PO TABS
1.5000 mg | ORAL_TABLET | Freq: Every day | ORAL | 0 refills | Status: DC
  Filled 2020-08-29: qty 30, 60d supply, fill #0

## 2020-08-29 MED ORDER — ACETAMINOPHEN 325 MG PO TABS: 325.0000 mg | ORAL_TABLET | ORAL | Status: DC | PRN

## 2020-08-29 MED ORDER — AMLODIPINE BESYLATE 5 MG PO TABS
5.0000 mg | ORAL_TABLET | Freq: Two times a day (BID) | ORAL | 0 refills | Status: DC
  Filled 2020-08-29: qty 60, 30d supply, fill #0

## 2020-08-29 MED ORDER — DOCUSATE SODIUM 100 MG PO CAPS: 100.0000 mg | ORAL_CAPSULE | Freq: Two times a day (BID) | ORAL | 0 refills | Status: DC

## 2020-08-29 NOTE — Progress Notes (Signed)
Patient ID: Danielle Kirby, female   DOB: [birth year], 75 y.o.   MRN: [id number]  SW ordered SPC, 3in1 BSC, and TTB with [employer] via parachute.  *cancelled [facility] as he has one.  SW sent outpatient PT/OT/SLP referral to [facility] (p:[phone]/[phone]).  SW spoke with [facility] to update on pt change from [facility] to Outpatient.   SW spoke with pt s/o [name] to review d/c. No changes for d/c tomorrow.   [name], MSW, LCSWA Office: [phone] Cell: [phone] Fax: [phone]

## 2020-08-29 NOTE — Progress Notes (Signed)
Nutrition Follow-up  DOCUMENTATION CODES:   Not applicable  INTERVENTION:  Continue Ensure Enlive po BID, each supplement provides 350 kcal and 20 grams of protein.  Continue Magic cup TID with meals, each supplement provides 290 kcal and 9 grams of protein  Encourage adequate PO intake.   NUTRITION DIAGNOSIS:   Increased nutrient needs related to other (see comment) (TBI) as evidenced by estimated needs; ongoing  GOAL:   Patient will meet greater than or equal to 90% of their needs; met  MONITOR:   PO intake,Supplement acceptance,Weight trends  REASON FOR ASSESSMENT:   Malnutrition Screening Tool    ASSESSMENT:   75 year old female with PMH of cataracts and osteoarthritis. Presented 07/26/20 after reports of multiple falls. Pt found to have SDH and facial fractures. Pt also with right distal radius fracture and minimally displaced radial head fracture. Admitted to CIR on 4/28.  Meal completion has been 50-100%. Pt currently has Ensure ordered with good consumption. RD to continue with current orders to aid in caloric and protein needs. Plans for discharge tomorrow. Recommend continuation of nutritional supplements post discharge to aid in adequate nutrition.   Labs and medications reviewed.   Diet Order:   Diet Order            Diet regular Room service appropriate? Yes with Assist; Fluid consistency: Thin  Diet effective now                 EDUCATION NEEDS:   No education needs have been identified at this time  Skin:  Skin Assessment: Reviewed RN Assessment  Last BM:  5/23  Height:   Ht Readings from Last 1 Encounters:  07/26/20 $RemoveB'5\' 7"'KSrmBwza$  (1.702 m)    Weight:   Wt Readings from Last 1 Encounters:  07/26/20 83.6 kg   BMI:  There is no height or weight on file to calculate BMI.  Estimated Nutritional Needs:   Kcal:  1700-1900  Protein:  85-100 grams  Fluid:  1.7 L/day  Corrin Parker, MS, RD, LDN RD pager number/after hours weekend pager  number on Amion.

## 2020-08-29 NOTE — Plan of Care (Signed)
  Problem: RH Balance Goal: LTG Patient will maintain dynamic sitting balance (PT) Description: LTG:  Patient will maintain dynamic sitting balance with assistance during mobility activities (PT) Outcome: Completed/Met Goal: LTG Patient will maintain dynamic standing balance (PT) Description: LTG:  Patient will maintain dynamic standing balance with assistance during mobility activities (PT) Outcome: Completed/Met   Problem: Sit to Stand Goal: LTG:  Patient will perform sit to stand with assistance level (PT) Description: LTG:  Patient will perform sit to stand with assistance level (PT) Outcome: Completed/Met   Problem: RH Bed Mobility Goal: LTG Patient will perform bed mobility with assist (PT) Description: LTG: Patient will perform bed mobility with assistance, with/without cues (PT). Outcome: Completed/Met   Problem: RH Bed to Chair Transfers Goal: LTG Patient will perform bed/chair transfers w/assist (PT) Description: LTG: Patient will perform bed to chair transfers with assistance (PT). Outcome: Completed/Met   Problem: RH Car Transfers Goal: LTG Patient will perform car transfers with assist (PT) Description: LTG: Patient will perform car transfers with assistance (PT). Outcome: Completed/Met   Problem: RH Ambulation Goal: LTG Patient will ambulate in controlled environment (PT) Description: LTG: Patient will ambulate in a controlled environment, # of feet with assistance (PT). Outcome: Completed/Met Goal: LTG Patient will ambulate in home environment (PT) Description: LTG: Patient will ambulate in home environment, # of feet with assistance (PT). Outcome: Completed/Met   Problem: RH Wheelchair Mobility Goal: LTG Patient will propel w/c in controlled environment (PT) Description: LTG: Patient will propel wheelchair in controlled environment, # of feet with assist (PT) Outcome: Not Met (add Reason)   Problem: RH Stairs Goal: LTG Patient will ambulate up and down stairs  w/assist (PT) Description: LTG: Patient will ambulate up and down # of stairs with assistance (PT) Outcome: Completed/Met   Problem: RH Attention Goal: LTG Patient will demonstrate this level of attention during functional activites (PT) Description: LTG:  Patient will demonstrate this level of attention during functional activites (PT) Outcome: Completed/Met  Magda Kiel, PT

## 2020-08-29 NOTE — Patient Care Conference (Signed)
Inpatient RehabilitationTeam Conference and Plan of Care Update Date: 08/29/2020   Time: 11:32 AM    Patient Name: Danielle Kirby      Medical Record Number: [id number]  Date of Birth: 14-Feb-1946 Sex: Female         Room/Bed: 4W13C/4W13C-01 Payor Info: Payor: MEDICARE / Plan: MEDICARE PART A / Product Type: *No Product type* /    Admit Date/Time:  08/02/2020  6:04 PM  Primary Diagnosis:  TBI (traumatic brain injury) [facility])  Hospital Problems: Principal Problem:   TBI (traumatic brain injury) (HCC) Active Problems:   Radial head fracture   Acute blood loss anemia   Essential hypertension   Traumatic injury of head with altered mental status   Post-operative pain   Benign essential HTN   Deep venous thrombosis (DVT) of left peroneal vein [facility])   Sleep disturbance    Expected Discharge Date: Expected Discharge Date: 08/30/20  Team Members Present: Physician leading conference: Dr. Maryla Morrow Care Coodinator Present: Chana Bode, RN, BSN, CRRN;[name], LCSWA Nurse Present: Chana Bode, RN PT Present: Sheran Lawless, PT OT Present: Other (comment) Cheyenne Adas, OT) SLP Present: Colin Benton, SLP PPS Coordinator present : Fae Pippin, SLP     Current Status/Progress Goal Weekly Team Focus  Bowel/Bladder   Patient is  cont of Bowel and bladder last bm 5/23  Patient will remain continent of bowel/bladder  assess qshift and prn   Swallow/Nutrition/ Hydration             ADL's   adl/shower CS and cues for initiation, functional transfers and ambulation CS/CGA with SPC.  Danielle Kirby attended family education and demonstrates good carryover and readiness for discharge.  CS  discharge prep, adl training, family education, DME   Mobility   Gait unsteady, non complianr with the use of walker  close supervision and encouragement to use assistive device  safety with gait and cane   Communication   speech clear but disoriented x1  reorient patient constantly       Safety/Cognition/ Behavioral Observations  mod-max A, continued poor awareness  min to mod asist  prevent behavior triggers   Pain   patient denies pain  Will continue to be pain free  assess qshift and prn   Skin   no skin issues. Intact  skin will continue to stay free from injury  assess qshift and prn     Discharge Planning:  Danielle Kirby-has been here and seen in therapies, able to provide 24/7 care. Fam edu completed on Tuesday (5/24) with partner [place].   Team Discussion: Patient still notes insomnia; sleep chart document reviewed.  Patient on target to meet rehab goals: yes, currently requires cues for ADLs and use of a cane. Supervision - CGA overall.  *See Care Plan and progress notes for long and short-term goals.   Revisions to Treatment Plan:   Teaching Needs: Family education completed 08/28/20; safety, cues, medications, etc.   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  Barriers to Discharge: Behavior;Medical stability;Weight bearing restrictions;Other (comments)   Possible Resolutions to [employer] Focus: Therapies, follow BP, follow labs - Hb, RUE splint, repeat DVT U/S improved, optimize BP meds, pt and family edu   Continued Need for Acute Rehabilitation Level of Care:  The patient requires daily medical management by a physician with specialized training in physical medicine and rehabilitation for the following reasons: Direction of a multidisciplinary physical rehabilitation program to maximize functional independence : Yes Medical management of patient stability for increased activity during participation in an intensive rehabilitation regime.: Yes Analysis  of laboratory values and/or radiology reports with any subsequent need for medication adjustment and/or medical intervention. : Yes   I attest that I was present, lead the team conference, and concur with the assessment and plan of the team.   Pamelia Hoit 08/29/2020, 2:37 PM

## 2020-08-29 NOTE — Progress Notes (Signed)
PHYSICAL MEDICINE & REHABILITATION PROGRESS NOTE  Subjective/Complaints: Patient seen sitting up this morning, Danielle Kirby.  She states she slept well overnight.  Sleep charting complete.  No reported issues.  Discussed improving awareness with patient and therapies.  ROS: Unreliable due to cognition  Objective: Vital Signs: Blood pressure 129/66, pulse 73, temperature 98.4 F (36.9 C), temperature source Oral, resp. rate 20, SpO2 100 %. VAS [place] LOWER EXTREMITY VENOUS (DVT)  Result Date: 08/28/2020  Lower Venous DVT Study Patient Name:  Danielle Kirby  Date of Exam:   08/28/2020 Medical Rec #: [id number]        Accession #:    [id number] Date of Birth: 02-12-1946         Patient Gender: F Patient Age:   61Y Exam Location:  [facility] Procedure:      VAS [place] LOWER EXTREMITY VENOUS (DVT) Referring Phys: [id number] Maansi Wike ANIL Kellin Fifer --------------------------------------------------------------------------------  Indications: Follow up DVT LT Pero V.  Comparison Study: 08-17-2020 Most recent prior LT lower venous showed age                   indeterminate DVT of the LT peroneal veins. Performing Technologist: Jean Rosenthal RDMS,RVT  Examination Guidelines: A complete evaluation includes B-mode imaging, spectral Doppler, color Doppler, and power Doppler as needed of all accessible portions of each vessel. Bilateral testing is considered an integral part of a complete examination. Limited examinations for reoccurring indications may be performed as noted. The reflux portion of the exam is performed with the patient in reverse Trendelenburg.  +-----+---------------+---------+-----------+----------+--------------+ RIGHTCompressibilityPhasicitySpontaneityPropertiesThrombus Aging +-----+---------------+---------+-----------+----------+--------------+ CFV  Full           Yes      Yes                                 +-----+---------------+---------+-----------+----------+--------------+    +---------+---------------+---------+-----------+----------+--------------+ LEFT     CompressibilityPhasicitySpontaneityPropertiesThrombus Aging +---------+---------------+---------+-----------+----------+--------------+ CFV      Full           Yes      Yes                                 +---------+---------------+---------+-----------+----------+--------------+ SFJ      Full                                                        +---------+---------------+---------+-----------+----------+--------------+ FV Prox  Full                                                        +---------+---------------+---------+-----------+----------+--------------+ FV Mid   Full                                                        +---------+---------------+---------+-----------+----------+--------------+ FV DistalFull                                                        +---------+---------------+---------+-----------+----------+--------------+  PFV      Full                                                        +---------+---------------+---------+-----------+----------+--------------+ POP      Full           Yes      Yes                                 +---------+---------------+---------+-----------+----------+--------------+ PTV      Full                                                        +---------+---------------+---------+-----------+----------+--------------+ PERO     Full           Yes      Yes                                 +---------+---------------+---------+-----------+----------+--------------+ Gastroc  Full                                                        +---------+---------------+---------+-----------+----------+--------------+    Summary: RIGHT: - No evidence of common femoral vein obstruction.  LEFT: - Findings appear improved from previous examination. - There is no evidence of deep vein thrombosis in the lower extremity.   - No cystic structure found in the popliteal fossa.  *See table(s) above for measurements and observations. Electronically signed by Waverly Ferrari MD on 08/28/2020 at 5:36:29 PM.    Final    No results for input(s): WBC, HGB, HCT, PLT in the last 72 hours. Recent Labs    08/27/20 0652  NA 140  K 4.1  CL 106  CO2 28  GLUCOSE 102*  BUN 11  CREATININE 0.82  CALCIUM 9.6    Intake/Output Summary (Last 24 hours) at 08/29/2020 1033 Last data filed at 08/29/2020 0715 Gross per 24 hour  Intake 480 ml  Output --  Net 480 ml        Physical Exam: BP 129/66 (BP Location: Left Arm)   Pulse 73   Temp 98.4 F (36.9 C) (Oral)   Resp 20   SpO2 100%   Constitutional: No distress . Vital signs reviewed. HENT: Normocephalic.  Atraumatic. Eyes: EOMI. No discharge. Cardiovascular: No JVD.  RRR. Respiratory: Normal effort.  No stridor.  Bilateral clear to auscultation. GI: Non-distended.  BS +. Skin: Warm and dry.   Right upper extremity splint CDI Psych: Confused, improving.  Confabulatory. Musc: Right upper extremity with splint in place Neuro: Alert x oriented, except for president with increased time Motor: 5/5 proximal to distal Limited insight and awareness, unchanged.  Motor: LUE/LLE: 4-4+/5 proximal distal, unchanged RUE: Shoulder abduction 4/5, distally limited by splint, appears unchanged RLE: 4-4+/5 proximal to distal, appears stable  Assessment/Plan: 1. Functional deficits which require 3+ hours per day of interdisciplinary  therapy in a comprehensive inpatient rehab setting.  Physiatrist is providing close team supervision and 24 hour management of active medical problems listed below.  Physiatrist and rehab team continue to assess barriers to discharge/monitor patient progress toward functional and medical goals   Care Tool:  Bathing    Body parts bathed by patient: Right arm,Chest,Abdomen,Front perineal area,Buttocks,Right upper leg,Left upper leg,Left lower  leg,Face,Right lower leg,Left arm   Body parts bathed by helper: Left arm     Bathing assist Assist Level: Supervision/Verbal cueing     Upper Body Dressing/Undressing Upper body dressing   What is the patient wearing?: Pull over shirt    Upper body assist Assist Level: Set up assist    Lower Body Dressing/Undressing Lower body dressing      What is the patient wearing?: Underwear/pull up,Pants     Lower body assist Assist for lower body dressing: Supervision/Verbal cueing     Toileting Toileting    Toileting assist Assist for toileting: Supervision/Verbal cueing     Transfers Chair/bed transfer  Transfers assist     Chair/bed transfer assist level: Supervision/Verbal cueing     Locomotion Ambulation   Ambulation assist   Ambulation activity did not occur: Safety/medical concerns  Assist level: Supervision/Verbal cueing Assistive device: Cane-straight Max distance: 150   Walk 10 feet activity   Assist  Walk 10 feet activity did not occur: Safety/medical concerns  Assist level: Supervision/Verbal cueing Assistive device: Cane-straight   Walk 50 feet activity   Assist Walk 50 feet with 2 turns activity did not occur: Safety/medical concerns  Assist level: Supervision/Verbal cueing Assistive device: Cane-straight    Walk 150 feet activity   Assist Walk 150 feet activity did not occur: Safety/medical concerns  Assist level: Supervision/Verbal cueing Assistive device: Cane-straight    Walk 10 feet on uneven surface  activity   Assist Walk 10 feet on uneven surfaces activity did not occur: Safety/medical concerns   Assist level: Contact Guard/Touching assist Assistive device: Other (comment) (no AD.)   Wheelchair     Assist Will patient use wheelchair at discharge?: No Type of Wheelchair: Manual Wheelchair activity did not occur: Safety/medical concerns  Wheelchair assist level: Supervision/Verbal cueing Max wheelchair  distance: 120'    Wheelchair 50 feet with 2 turns activity    Assist    Wheelchair 50 feet with 2 turns activity did not occur: Safety/medical concerns   Assist Level: Supervision/Verbal cueing   Wheelchair 150 feet activity     Assist  Wheelchair 150 feet activity did not occur: Safety/medical concerns        Medical Problem List and Plan: 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 Completed 7-day course of Keppra for seizure prophylaxis. Follow-up Dr. Conchita Paris  Continue CIR  Team conference today to discuss current and goals and coordination of care, home and environmental barriers, and discharge planning with nursing, case manager, and therapies. Please see conference note from today as well.  2. 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 No current plans for long-term anticoagulation due to risk of fall/SDH.  Repeat vascular study reviewed, appears unchanged, left calf DVT no proximal migration  Repeat Dopplers showing improvement DVT-discussed with radiology -antiplatelet therapy: Continue Lovenox 30 mg every 12 hours 3. Pain Management:Changed Naprosyn 250 mg to twice daily PRN since she denies pain, Ultram as needed  Appears controlled on 5/25 4. Mood:Provide emotional support -antipsychotic agents: Seroquel 12.5 mg qhs increased to 25 on 5/10 5. Neuropsych: This patientis notcapable of making decisions on herown  behalf.  Telesitter for safety 6. Skin/Wound Care:Routine skin checks 7. Fluids/Electrolytes/Nutrition:Routine in and outs  CMP within acceptable range on 5/23 8. Right maxillary sinus, right lateral orbital wall and floor fractures with fat herniation. Follow-up outpatient Dr Leta Baptisthimmappa. No surgical intervention planned 9. 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 Follow-up as outpatient. 10. Hypertension.   Norvasc changed to 5 BID on 5/19 due to  overnight elevations. Vitals:   08/28/20 2009 08/29/20 0626  BP: 118/61 129/66  Pulse: 74 73  Resp: 20 20  Temp: 98.1 F (36.7 C) 98.4 F (36.9 C)  SpO2: 100% 100%   Relatively controlled on 5/25 11. Incidental finding of abnormal appearing endometrium identified on MRI lumbar spine. Follow-up outpatient OB/GYN 12. Right distal comminuted distal radius fracture and minimally displaced radial head fracture. No surgical intervention at this time follow-up outpatient Dr. Merlyn LotKuzma.  -Nonweightbearing with splintRUE, evaluated by Ortho again-recommending nonoperative management with change in splint. -encourage pt to keep splint in place, reminded 13.  Acute blood loss anemia  Hemoglobin 11.2 on 5/20  Continue to monitor 14. Sleep disturbance  Seroquel ordered  Sleep chart ordered  Improving overall  LOS: 27 days A FACE TO FACE EVALUATION WAS PERFORMED  Kiefer Opheim Karis Jubanil Kahmari Koller 08/29/2020, 10:33 AM

## 2020-08-29 NOTE — Progress Notes (Signed)
Physical Therapy Discharge Summary  Patient Details  Name: Danielle Kirby MRN: [id number] Date of Birth: April 09, 1945  Today's Date: 08/29/2020 PT Individual Time: 0930-1030 PT Individual Time Calculation (min): 60 min   Skilled Therapeutic Interventions/Progress Updates:  Paient in recliner with alarm belt on.  Reports did shower and dressing with OT.  Performed sit to stand with S using armrest on L and cues to not used R.  Patient ambulated x 150' to therapy gym.  Negotiated stairs with L rail only (side stepping to descend,) R rail only x 4 steps, (side stepping to ascend,) then 4 steps with bilateral rails.  Negotiated with step to and step through sequences.  Patient ambulated to ortho gym with [facility] and S with cues for using cane.  Performed car transfer with S using step for running board to mid size SUV height.  Patient performed TUG as noted below, then with holding water cup for TUG manual.  Patient completed counter balance/coordination activities with UE support and issued for HEP including side stepping, braiding, forward tandem gait, and backward walking. Patient ambulated to room with [facility] and S mod cues for wayfinding x 200'.  Issued HEP and reviewed with pt and spouse.  Patient left in recliner with alarm belt and spouse in the room, call bell in reach.     Patient has met 10 of 11 long term goals due to improved activity tolerance, improved balance, increased strength, ability to compensate for deficits, improved attention and improved coordination.  Patient to discharge at an ambulatory level Supervision.   Patient's care partner is independent to provide the necessary physical and cognitive assistance at discharge.  Reasons goals not met: All goals met, except for w/c mobility, did not continue with training as pt progressing so well with ambulation and NWB on R UE.    Recommendation:  Patient will benefit from ongoing skilled PT services in outpatient setting to continue to advance  safe functional mobility, address ongoing impairments in balance, coordination, safety, awareness, and minimize fall risk.  Equipment: single point cane  Reasons for discharge: treatment goals met and discharge from hospital  Patient/family agrees with progress made and goals achieved: Yes  PT Discharge Precautions/Restrictions Precautions Precautions: Fall Precaution Comments: L wrist clamshell splint Restrictions Weight Bearing Restrictions: Yes RUE Weight Bearing: Non weight bearing Vital Signs Therapy Vitals Temp: 98.4 F (36.9 C) Temp Source: Oral Pulse Rate: 73 Resp: 20 BP: 129/66 Patient Position (if appropriate): Lying Oxygen Therapy SpO2: 100 % O2 Device: Room Air Pain Pain Assessment Pain Scale: 0-10 Pain Score: 0-No pain Vision/Perception  Perception Perception: Within Functional Limits Praxis Praxis: Impaired Praxis Impairment Details: Initiation Praxis-Other Comments: slow to initiate, some cues for motor planning  Cognition Arousal/Alertness: Awake/alert Orientation Level: Oriented to person;Oriented to place ("hospital") Attention: Sustained Memory: Impaired Safety/Judgment: Impaired Sensation Sensation Light Touch: Appears Intact Coordination Gross Motor Movements are Fluid and Coordinated: No Coordination and Movement Description: able to complete smoothly but needs extra time for motor planning Motor  Motor Motor: Motor apraxia Motor - Discharge Observations: slow to initiate still and affects coordination  Mobility Bed Mobility Bed Mobility: Supine to Sit Supine to Sit: Supervision/Verbal cueing Sit to Supine: Supervision/Verbal cueing Transfers Transfers: Sit to Stand;Stand to Sit Sit to Stand: Supervision/Verbal cueing Stand to Sit: Supervision/Verbal cueing Transfer (Assistive device): None Locomotion  Gait Ambulation: Yes Gait Assistance: Supervision/Verbal cueing Gait Distance (Feet): 200 Feet Assistive device: Straight  cane Gait Assistance Details: Verbal cues for safe use of DME/AE Gait  Assistance Details: cues for using cane, not carrying Gait Gait: Yes Gait Pattern: Step-through pattern;Poor foot clearance - left;Poor foot clearance - right;Lateral trunk lean to right Stairs / Additional Locomotion Stairs: Yes Stairs Assistance: Contact Guard/Touching assist Stair Management Technique: One rail Right;One rail Left;Alternating pattern;Sideways;Forwards Number of Stairs: 12 Height of Stairs: 6 Ramp: Supervision/Verbal cueing Wheelchair Mobility Wheelchair Mobility: No  Trunk/Postural Assessment  Cervical Assessment Cervical Assessment: Within Functional Limits Thoracic Assessment Thoracic Assessment: Exceptions to [facility] (rounded shoulders) Lumbar Assessment Lumbar Assessment: Within Functional Limits Postural Control Postural Control: Deficits on evaluation Protective Responses: slower  Balance Standardized Balance Assessment Standardized Balance Assessment: Timed Up and Go Test Timed Up and Go Test Normal TUG (seconds): 16.5 Manual TUG (seconds): 20.7 Static Sitting Balance Static Sitting - Level of Assistance: 6: Modified independent (Device/Increase time) Dynamic Sitting Balance Dynamic Sitting - Level of Assistance: 5: Stand by assistance Static Standing Balance Static Standing - Level of Assistance: 5: Stand by assistance Dynamic Standing Balance Dynamic Standing - Level of Assistance: 5: Stand by assistance Extremity Assessment      RLE Assessment Passive Range of Motion (PROM) Comments: WFL General Strength Comments: WFL LLE Assessment LLE Assessment: Exceptions to [facility] Active Range of Motion (AROM) Comments: [facility] General Strength Comments: hip flexion 3-/5, knee extension 4/5, ankle DF [address]  [place], PT 08/29/2020, 10:17 AM

## 2020-08-29 NOTE — Progress Notes (Signed)
Occupational Therapy Discharge Summary  Patient Details  Name: Danielle Kirby MRN: [id number] Date of Birth: 19-May-1945  Today's Date: 08/29/2020 OT Individual Time: 0800-0900, 1445-1515 OT Individual Time Calculation (min): 60 min and 30 min    Patient has met 13 of 13 long term goals due to improved activity tolerance, improved balance, postural control, improved attention, improved awareness and improved coordination.  Patient has surpassed min A goals and to discharge at overall Supervision level.  Patient's care partner is independent to provide the necessary physical and cognitive assistance at discharge.    Reasons goals not met: While goals have been met or surpassed, pt continues to be limited by functional cognition in areas of attention, awareness, memory, and problem-solving which affect safety and independence in ADLs and functional mobility.   Recommendation:  Patient will benefit from ongoing skilled OT services in outpatient setting to continue to advance functional skills in the area of iADL and Reduce care partner burden. OP OT services warranted to ensure safe d/c to home environment.  Equipment: BSC, TTB  Reasons for discharge: treatment goals met and discharge from hospital  Patient/family agrees with progress made and goals achieved: Yes  Skilled Therapeutic Interventions: Session 1: Pt received seated in recliner eating breakfast, agreeable to OT but wanting to finish breakfast first. Pt oriented to date, time, year, day of the week, and place. During eating, pt demo'd difficulties with sustained and alternating attention becoming easily distracted by people in the hallway and perseverative regarding excess items on tray (frequently rearranging them and asking why things were there). ~6 items removed from tray to decrease stimulation for improved attention to task. Pt picked out clothes with mod vc's due to perseveration on desired color of pants. Pt ambulated with  single point cane and close spvsn chair<>bathroom ~40'. Pt completed 3/3 toileting tasks and then showered using shower chair with close spvsn. Mod vc's for sequencing and attention to task. UB/LB dressing close spvsn seated in recliner with multimodal cues for safety and to thread pants on BLEs when seated and not in stance. Pt donned socks and tennis shoes with close spvsn seated. Pt left seated in recliner, alarm set, call bell in reach, and all needs met.      Session 2: Pt received seated EOB, bed alarm sounding, with pt slightly agitated stating she was in her own home and wanted some peace and quiet. Pt oriented x1 and confabulating, but with conversation and visual cues, pt oriented x3. Pt pressed call bell light 3x thinking it was the mute button for her television stating "I always need to make her stop talking" despite vc's. Pt requiring min vc's for encouragement to travel to the therapy gym; ambulated with single point cane with CGA-close spvsn as pt often held cane in hand instead of touching to ground despite vc's. Pt engaged in simple meal prep task involving reading a recipe and gathering picture ingredients from large field of options. Throughout task, pt perseverating and easily distractible by unnecessary ingredients and others in therapy gym requiring mod-max multimodal cues and sorting 'distracting ingredients'. Pt ambulated back to room with 2-3 verbal/visual cues to locate hospital room. Pt left seated in recliner, alarm set, call bell in reach, and all needs met. ABS completed in flow sheets.   OT Discharge Precautions/Restrictions  Precautions Precautions: Fall Precaution Comments: L wrist clamshell splint Required Braces or Orthoses: Splint/Cast Splint/Cast: L wrist clamshell splint Restrictions Weight Bearing Restrictions: Yes RUE Weight Bearing: Non weight bearing Pain Pain  Assessment Pain Scale: 0-10 Pain Score: 0-No pain Faces Pain Scale: No hurt ADL ADL Eating:  Set up Where Assessed-Eating: Chair Grooming: Supervision/safety Where Assessed-Grooming: Standing at sink,Sitting at sink Upper Body Bathing: Supervision/safety Where Assessed-Upper Body Bathing: Shower,Other (Comment) (seated in shower chair) Lower Body Bathing: Supervision/safety Where Assessed-Lower Body Bathing: Shower,Other (Comment) (seated in shower chair) Upper Body Dressing: Supervision/safety Where Assessed-Upper Body Dressing: Edge of bed,Chair Lower Body Dressing: Supervision/safety Where Assessed-Lower Body Dressing: Chair,Edge of bed Toileting: Supervision/safety Where Assessed-Toileting: Glass blower/designer: Close supervision Armed forces technical officer Method: Counselling psychologist: Bedside commode,Grab bars Tub/Shower Transfer: Close supervison Clinical cytogeneticist Method: Optometrist: Engineer, water: Close supervision Social research officer, government Method: Heritage manager: Civil engineer, contracting with back,Grab bars Vision Baseline Vision/History: Wears glasses Wears Glasses: At all times Patient Visual Report: No change from baseline Perception  Perception: Within Functional Limits Praxis Praxis: Impaired Praxis Impairment Details: Initiation Praxis-Other Comments: slow initation/termination, some cues for motor planning/perseveration Cognition Overall Cognitive Status: Impaired/Different from baseline Arousal/Alertness: Awake/alert Orientation Level: Oriented X4 Attention: Sustained;Divided;Focused Focused Attention: Impaired Focused Attention Impairment: Verbal basic;Functional basic Sustained Attention: Impaired Sustained Attention Impairment: Verbal basic;Functional basic Divided Attention: Impaired Divided Attention Impairment: Functional basic Memory: Impaired Memory Impairment: Decreased short term memory;Storage deficit Decreased Short Term Memory: Verbal basic;Functional  basic;Verbal complex;Functional complex Awareness: Impaired Awareness Impairment: Intellectual impairment Problem Solving: Impaired Problem Solving Impairment: Verbal basic;Functional basic Executive Function: Sequencing;Reasoning Reasoning: Impaired Reasoning Impairment: Verbal basic;Verbal complex;Functional basic;Functional complex Sequencing: Impaired Sequencing Impairment: Verbal basic;Functional basic Behaviors: Perseveration;Impulsive Safety/Judgment: Impaired [employer] Scales of Cognitive Functioning: Confused/inappropriate/non-agitated Sensation Sensation Light Touch: Appears Intact Hot/Cold: Appears Intact Proprioception: Appears Intact Stereognosis: Appears Intact Coordination Gross Motor Movements are Fluid and Coordinated: No Fine Motor Movements are Fluid and Coordinated: No Coordination and Movement Description: able to complete smoothly but needs extra time for motor planning; somewhat limited by RUE precautions Motor  Motor Motor: Motor apraxia Motor - Discharge Observations: slow to initiate still and affects coordination Mobility  Bed Mobility Bed Mobility: Supine to Sit;Sit to Supine Supine to Sit: Supervision/Verbal cueing Sit to Supine: Supervision/Verbal cueing Transfers Sit to Stand: Supervision/Verbal cueing Stand to Sit: Supervision/Verbal cueing  Trunk/Postural Assessment  Cervical Assessment Cervical Assessment: Within Functional Limits Thoracic Assessment Thoracic Assessment: Exceptions to [facility] (rounded shoulders) Lumbar Assessment Lumbar Assessment: Within Functional Limits Postural Control Postural Control: Deficits on evaluation Protective Responses: slower  Balance Balance Balance Assessed: Yes Standardized Balance Assessment Standardized Balance Assessment: Timed Up and Go Test Timed Up and Go Test Normal TUG (seconds): 16.5 Manual TUG (seconds): 20.7 Static Sitting Balance Static Sitting - Balance Support: Feet  supported;No upper extremity supported Static Sitting - Level of Assistance: 6: Modified independent (Device/Increase time) Dynamic Sitting Balance Dynamic Sitting - Balance Support: Feet supported;No upper extremity supported Dynamic Sitting - Level of Assistance: 5: Stand by assistance Dynamic Sitting - Balance Activities: Forward lean/weight shifting;Lateral lean/weight shifting;Reaching for objects;Reaching across midline Static Standing Balance Static Standing - Balance Support: No upper extremity supported;During functional activity Static Standing - Level of Assistance: 5: Stand by assistance Dynamic Standing Balance Dynamic Standing - Balance Support: No upper extremity supported;Left upper extremity supported;During functional activity Dynamic Standing - Level of Assistance: 5: Stand by assistance Dynamic Standing - Balance Activities: Lateral lean/weight shifting;Forward lean/weight shifting;Reaching for objects;Reaching across midline Extremity/Trunk Assessment RUE Assessment RUE Assessment: Exceptions to [facility] General Strength Comments: R UE limited wrist/hand with clam shell splint, NWB RUE LUE Assessment LUE Assessment: Within Functional  Limits   Mellissa Kohut 08/29/2020, 1:04 PM

## 2020-08-29 NOTE — Plan of Care (Signed)
  Problem: RH Balance Goal: LTG: Patient will maintain dynamic sitting balance (OT) Description: LTG:  Patient will maintain dynamic sitting balance with assistance during activities of daily living (OT) Outcome: Completed/Met Goal: LTG Patient will maintain dynamic standing with ADLs (OT) Description: LTG:  Patient will maintain dynamic standing balance with assist during activities of daily living (OT)  Outcome: Completed/Met   Problem: Sit to Stand Goal: LTG:  Patient will perform sit to stand in prep for activites of daily living with assistance level (OT) Description: LTG:  Patient will perform sit to stand in prep for activites of daily living with assistance level (OT) Outcome: Completed/Met   Problem: RH Eating Goal: LTG Patient will perform eating w/assist, cues/equip (OT) Description: LTG: Patient will perform eating with assist, with/without cues using equipment (OT) Outcome: Completed/Met   Problem: RH Grooming Goal: LTG Patient will perform grooming w/assist,cues/equip (OT) Description: LTG: Patient will perform grooming with assist, with/without cues using equipment (OT) Outcome: Completed/Met   Problem: RH Bathing Goal: LTG Patient will bathe all body parts with assist levels (OT) Description: LTG: Patient will bathe all body parts with assist levels (OT) Outcome: Completed/Met   Problem: RH Dressing Goal: LTG Patient will perform upper body dressing (OT) Description: LTG Patient will perform upper body dressing with assist, with/without cues (OT). Outcome: Completed/Met Goal: LTG Patient will perform lower body dressing w/assist (OT) Description: LTG: Patient will perform lower body dressing with assist, with/without cues in positioning using equipment (OT) Outcome: Completed/Met   Problem: RH Toileting Goal: LTG Patient will perform toileting task (3/3 steps) with assistance level (OT) Description: LTG: Patient will perform toileting task (3/3 steps) with  assistance level (OT)  Outcome: Completed/Met   Problem: RH Functional Use of Upper Extremity Goal: LTG Patient will use RT/LT upper extremity as a (OT) Description: LTG: Patient will use right/left upper extremity as a stabilizer/gross assist/diminished/nondominant/dominant level with assist, with/without cues during functional activity (OT) Outcome: Completed/Met   Problem: RH Toilet Transfers Goal: LTG Patient will perform toilet transfers w/assist (OT) Description: LTG: Patient will perform toilet transfers with assist, with/without cues using equipment (OT) Outcome: Completed/Met   Problem: RH Tub/Shower Transfers Goal: LTG Patient will perform tub/shower transfers w/assist (OT) Description: LTG: Patient will perform tub/shower transfers with assist, with/without cues using equipment (OT) Outcome: Completed/Met   Problem: RH Awareness Goal: LTG: Patient will demonstrate awareness during functional activites type of (OT) Description: LTG: Patient will demonstrate awareness during functional activites type of (OT) Outcome: Completed/Met Note: Pt inconsistent in awareness during functional tasks; occasionally progressing to emergent awareness, but often needing cues for both intellectual and emergent awareness at d/c.

## 2020-08-29 NOTE — Progress Notes (Signed)
Speech Language Pathology Discharge Summary  Patient Details  Name: Danielle Kirby MRN: [id number] Date of Birth: [birth year]  Today's Date: 08/29/2020 SLP Individual Time: 2800-3491 SLP Individual Time Calculation (min): 60 min   Skilled Therapeutic Interventions:  Skilled SLP intervention focused on cognition. Pt completed structured conversational and written sustained attention task. She demonstrated improvement in topic maintenance and organizing thoughts for listener to understand. She completed written problem solving puzzle with mod A for divided attention and locating information. Pt oriented to date, day of the week month and dc date using calendar with supervision to min A. Pt going home tomorrow with friend [name]). She was able to verbalize 3 tasks she will need assistance with once home with moderate verbal  cues.    Patient has met 4 of 4 long term goals.  Patient to discharge at overall Min;Mod level.    Clinical Impression/Discharge Summary:   Pt has met 4 of 4 long term goals this admission. She demonstrates improvement in sustained attention, problem solving with basic daily situations, overall reasoning with medical situation and safety awareness. She is currently overall min-mod A for cognitive tasks and requires verbal and visual cues for attention with written information, processing of written information, and verbal cues for orientation and problem solving with basic daily tasks. Pt and family education completed and pt will discharge home with 24 hour supervision from friend. Pt will benefit from follow up SLP services to maximize her functional independence and continue to improve cognitive skills and overall safety in home environment.   Care Partner:  Caregiver Able to Provide Assistance: Yes  Type of Caregiver Assistance: Physical;Cognitive  Recommendation:  24 hour supervision/assistance;Home Health SLP  Rationale for SLP Follow Up: Maximize functional  communication   Equipment: none recommended at this time   Reasons for discharge: Treatment goals met;Other (comment) (Pt going home tomorrow. All LTG's met.)   Patient/Family Agrees with Progress Made and Goals Achieved: Yes    Darrol Poke Maxine Fredman 08/29/2020, 11:36 AM

## 2020-08-29 NOTE — Discharge Instructions (Signed)
Inpatient Rehab Discharge Instructions  [facility] Discharge date and time: No discharge date for patient encounter.   Activities/Precautions/ Functional Status: Activity: Nonweightbearing right upper extremity with splint Diet: regular diet Wound Care: Routine skin checks Functional status:  ___ No restrictions     ___ Walk up steps independently ___ 24/7 supervision/assistance   ___ Walk up steps with assistance ___ Intermittent supervision/assistance  ___ Bathe/dress independently ___ Walk with walker     _x__ Bathe/dress with assistance ___ Walk Independently    ___ Shower independently ___ Walk with assistance    ___ Shower with assistance ___ No alcohol     ___ Return to work/school ________  Special Instructions: No driving smoking or alcohol  Follow-up outpatient OB/GYN for incidental findings of abnormal appearing endometrium    COMMUNITY REFERRALS UPON DISCHARGE:    Home Health:   PT, OT, SP                  Agency: [facility]        Phone:[phone] *Please expect follow-up within 7-10 business days from date of discharge to schedule appointment. If you do not receive follow-up, be sure to contact the site directly.*  Medical Equipment/Items Ordered: single point cane, and tub transfer bench                                                 Agency/Supplier: [employer] [phone]  GENERAL COMMUNITY RESOURCES FOR PATIENT/FAMILY: Support Groups:[employer]   My questions have been answered and I understand these instructions. I will adhere to these goals and the provided educational materials after my discharge from the hospital.  Patient/Caregiver Signature _______________________________ Date __________  Clinician Signature _______________________________________ Date __________  Please bring this form and your medication list with you to all your follow-up doctor's appointments.

## 2020-08-30 NOTE — Progress Notes (Signed)
[place] PHYSICAL MEDICINE & REHABILITATION PROGRESS NOTE  Subjective/Complaints: Patient seen sitting up at the edge of her bed this AM.  She states she sletpwell overnight.  No reported issues. She is aware of discharge date.   ROS: Unreliable due to cognition.   Objective: Vital Signs: Blood pressure 133/70, pulse 67, temperature 98.8 F (37.1 C), resp. rate 18, SpO2 100 %. VAS [place] LOWER EXTREMITY VENOUS (DVT)  Result Date: 08/28/2020  Lower Venous DVT Study Patient Name:  Danielle Kirby  Date of Exam:   08/28/2020 Medical Rec #: [id number]        Accession #:    [id number] Date of Birth: 13-Jul-1945         Patient Gender: F Patient Age:   50Y Exam Location:  [facility] Procedure:      VAS [place] LOWER EXTREMITY VENOUS (DVT) Referring Phys: [id number] Jenavive Lamboy ANIL Elbert Spickler --------------------------------------------------------------------------------  Indications: Follow up DVT LT Pero V.  Comparison Study: 08-17-2020 Most recent prior LT lower venous showed age                   indeterminate DVT of the LT peroneal veins. Performing Technologist: Jean Rosenthal RDMS,RVT  Examination Guidelines: A complete evaluation includes B-mode imaging, spectral Doppler, color Doppler, and power Doppler as needed of all accessible portions of each vessel. Bilateral testing is considered an integral part of a complete examination. Limited examinations for reoccurring indications may be performed as noted. The reflux portion of the exam is performed with the patient in reverse Trendelenburg.  +-----+---------------+---------+-----------+----------+--------------+ RIGHTCompressibilityPhasicitySpontaneityPropertiesThrombus Aging +-----+---------------+---------+-----------+----------+--------------+ CFV  Full           Yes      Yes                                 +-----+---------------+---------+-----------+----------+--------------+    +---------+---------------+---------+-----------+----------+--------------+ LEFT     CompressibilityPhasicitySpontaneityPropertiesThrombus Aging +---------+---------------+---------+-----------+----------+--------------+ CFV      Full           Yes      Yes                                 +---------+---------------+---------+-----------+----------+--------------+ SFJ      Full                                                        +---------+---------------+---------+-----------+----------+--------------+ FV Prox  Full                                                        +---------+---------------+---------+-----------+----------+--------------+ FV Mid   Full                                                        +---------+---------------+---------+-----------+----------+--------------+ FV DistalFull                                                        +---------+---------------+---------+-----------+----------+--------------+  PFV      Full                                                        +---------+---------------+---------+-----------+----------+--------------+ POP      Full           Yes      Yes                                 +---------+---------------+---------+-----------+----------+--------------+ PTV      Full                                                        +---------+---------------+---------+-----------+----------+--------------+ PERO     Full           Yes      Yes                                 +---------+---------------+---------+-----------+----------+--------------+ Gastroc  Full                                                        +---------+---------------+---------+-----------+----------+--------------+    Summary: RIGHT: - No evidence of common femoral vein obstruction.  LEFT: - Findings appear improved from previous examination. - There is no evidence of deep vein thrombosis in the lower extremity.   - No cystic structure found in the popliteal fossa.  *See table(s) above for measurements and observations. Electronically signed by Waverly Ferrari MD on 08/28/2020 at 5:36:29 PM.    Final    No results for input(s): WBC, HGB, HCT, PLT in the last 72 hours. No results for input(s): NA, K, CL, CO2, GLUCOSE, BUN, CREATININE, CALCIUM in the last 72 hours.  Intake/Output Summary (Last 24 hours) at 08/30/2020 1032 Last data filed at 08/30/2020 0827 Gross per 24 hour  Intake 720 ml  Output --  Net 720 ml        Physical Exam: BP 133/70 (BP Location: Left Arm)   Pulse 67   Temp 98.8 F (37.1 C)   Resp 18   SpO2 100%   Constitutional: No distress . Vital signs reviewed. HENT: Normocephalic.  Atraumatic. Eyes: EOMI. No discharge. Cardiovascular: No JVD.  RRR. Respiratory: Normal effort.  No stridor.  Bilateral clear to auscultation. GI: Non-distended.  BS +. Skin: Warm and dry.   Right upper extremity splint CDI Psych: Confused, improving.  Confabulatory. Musc: Right upper extremity with splint in place, stable Neuro: Alert x oriented x2 with cues Motor: 5/5 proximal to distal Limited insight and awareness, stable.  Motor: LUE/LLE: 4-4+/5 proximal distal, unchanged RUE: Shoulder abduction 4/5, distally limited by splint, appears unchanged RLE: 4-4+/5 proximal to distal, appears unchanged  Assessment/Plan: 1. Functional deficits which require 3+ hours per day of interdisciplinary therapy in a comprehensive inpatient rehab setting.  Physiatrist is providing close team supervision and 24 hour management of  active medical problems listed below.  Physiatrist and rehab team continue to assess barriers to discharge/monitor patient progress toward functional and medical goals   Care Tool:  Bathing    Body parts bathed by patient: Right arm,Chest,Abdomen,Front perineal area,Buttocks,Right upper leg,Left upper leg,Left lower leg,Face,Right lower leg,Left arm   Body parts bathed by  helper: Left arm     Bathing assist Assist Level: Supervision/Verbal cueing     Upper Body Dressing/Undressing Upper body dressing   What is the patient wearing?: Pull over shirt    Upper body assist Assist Level: Supervision/Verbal cueing    Lower Body Dressing/Undressing Lower body dressing      What is the patient wearing?: Underwear/pull up,Pants     Lower body assist Assist for lower body dressing: Supervision/Verbal cueing     Toileting Toileting    Toileting assist Assist for toileting: Supervision/Verbal cueing     Transfers Chair/bed transfer  Transfers assist     Chair/bed transfer assist level: Supervision/Verbal cueing     Locomotion Ambulation   Ambulation assist   Ambulation activity did not occur: Safety/medical concerns  Assist level: Supervision/Verbal cueing Assistive device: Cane-straight Max distance: 200   Walk 10 feet activity   Assist  Walk 10 feet activity did not occur: Safety/medical concerns  Assist level: Supervision/Verbal cueing Assistive device: Cane-straight   Walk 50 feet activity   Assist Walk 50 feet with 2 turns activity did not occur: Safety/medical concerns  Assist level: Supervision/Verbal cueing Assistive device: Cane-straight    Walk 150 feet activity   Assist Walk 150 feet activity did not occur: Safety/medical concerns  Assist level: Supervision/Verbal cueing Assistive device: Cane-straight    Walk 10 feet on uneven surface  activity   Assist Walk 10 feet on uneven surfaces activity did not occur: Safety/medical concerns   Assist level: Supervision/Verbal cueing Assistive device: Ship broker Will patient use wheelchair at discharge?: No Type of Wheelchair: Manual Wheelchair activity did not occur: Safety/medical concerns  Wheelchair assist level: Supervision/Verbal cueing Max wheelchair distance: 120'    Wheelchair 50 feet with 2 turns  activity    Assist    Wheelchair 50 feet with 2 turns activity did not occur: Safety/medical concerns   Assist Level: Supervision/Verbal cueing   Wheelchair 150 feet activity     Assist  Wheelchair 150 feet activity did not occur: Safety/medical concerns        Medical Problem List and Plan: 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 Completed 7-day course of Keppra for seizure prophylaxis. Follow-up Dr. Conchita Paris  DC today  Will see patient for hospital follow up in 1 month post-discharge 2. 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 No current plans for long-term anticoagulation due to risk of fall/SDH.  Repeat vascular study reviewed, appears unchanged, left calf DVT no proximal migration  Repeat Dopplers showing improvement DVT-discussed with radiology -antiplatelet therapy: Continue Lovenox 30 mg every 12 hours 3. Pain Management:Changed Naprosyn 250 mg to twice daily PRN since she denies pain, Ultram as needed  Appears controlled on 5/26 4. Mood:Provide emotional support -antipsychotic agents: Seroquel 12.5 mg qhs increased to 25 on 5/10 5. Neuropsych: This patientis notcapable of making decisions on herown behalf.  Telesitter for safety 6. Skin/Wound Care:Routine skin checks 7. Fluids/Electrolytes/Nutrition:Routine in and outs  CMP within acceptable range on 5/23 8. Right maxillary sinus, right lateral orbital wall and floor fractures with fat herniation. Follow-up outpatient Dr Leta Baptist. No surgical intervention planned 9. Left ICA  stenosis. 50% noted. Follow-up as outpatient. 10. Hypertension.   Norvasc changed to 5 BID on 5/19 due to overnight elevations. Vitals:   08/29/20 1438 08/30/20 0637  BP: 127/66 133/70  Pulse: 82 67  Resp: 18   Temp: 98.6 F (37 C) 98.8 F (37.1 C)  SpO2: 100% 100%   Controlled on 5/26 11. Incidental finding of  abnormal appearing endometrium identified on MRI lumbar spine. Follow-up outpatient OB/GYN 12. Right distal comminuted distal radius fracture and minimally displaced radial head fracture. No surgical intervention at this time follow-up outpatient Dr. Merlyn LotKuzma.  -Nonweightbearing with splintRUE, evaluated by Ortho again-recommending nonoperative management with change in splint. -encourage pt to keep splint in place, reminded 13.  Acute blood loss anemia  Hemoglobin 11.2 on 5/20  Continue to monitor 14. Sleep disturbance  Seroquel ordered  Sleep chart ordered  Improving overall  > 30 minutes spent in total in discharge planning between myself and PA regarding aforementioned, as well discussion regarding DME equipment, follow-up appointments, follow-up therapies, discharge medications, discharge recommendations, answering questions.  Please see discharge summary as well.  LOS: 28 days A FACE TO FACE EVALUATION WAS PERFORMED  Yohance Hathorne Karis Jubanil Yeraldin Litzenberger 08/30/2020, 10:32 AM

## 2020-08-30 NOTE — Progress Notes (Signed)
Inpatient Rehabilitation Care Coordinator Discharge Note  The overall goal for the admission was met for:   *This  Discharge location: Yes. D/c to home with s/o [name] who will provide 24/7 care.   Length of Stay: Yes. 27 days.   Discharge activity level: Yes. Supervision.  Home/community participation: Yes. Limited.   Services provided included: MD, RD, PT, OT, SLP, RN, CM, TR, Pharmacy, Neuropsych and SW  Financial Services: Medicare and Private Insurance: [place] offered to/list presented to:Yes  Follow-up services arranged: Outpatient: [facility] for outpatient PT/OT/SLP and DME: [place] for [facility] and [facility] ( to be picked up at retail store).  Comments (or additional information):  Patient/Family verbalized understanding of follow-up arrangements: Yes  Individual responsible for coordination of the follow-up plan: contact pt s/o [name] # [phone]  Confirmed correct DME delivered: Rana Snare 08/30/2020    Rana Snare

## 2020-08-30 NOTE — Progress Notes (Signed)
Patient ID: Danielle Kirby, female   DOB: 1945/12/31, 75 y.o.   MRN: [id number]   This SW covering for primary SW, Danielle Kirby.   SW informed by Danielle Kirby/[facility] that they are out of [facility] at hospital, and item can be picked up in retial store. SW spoke with Danielle Kirby/[facility] (apart of [employer] [facility]/ [phone]) to discuss if items for [facility] and [facility] are in stock. Reports they are and will inform the retail store. SW provided pt s/o Danielle Kirby with contact information for location for pick up.   Danielle Kirby, MSW, LCSWA Office: [phone] Cell: [phone] Fax: [phone]

## 2020-09-10 ENCOUNTER — Encounter: Payer: Federal, State, Local not specified - PPO | Attending: Registered Nurse | Admitting: Registered Nurse

## 2020-09-10 ENCOUNTER — Other Ambulatory Visit: Payer: Self-pay

## 2020-09-10 ENCOUNTER — Encounter: Payer: Self-pay | Admitting: Registered Nurse

## 2020-09-10 VITALS — BP 113/77 | HR 89 | Temp 98.3°F | Ht 67.0 in | Wt 177.3 lb

## 2020-09-10 DIAGNOSIS — S52501S Unspecified fracture of the lower end of right radius, sequela: Secondary | ICD-10-CM

## 2020-09-10 DIAGNOSIS — S069X1S Unspecified intracranial injury with loss of consciousness of 30 minutes or less, sequela: Secondary | ICD-10-CM

## 2020-09-10 DIAGNOSIS — I10 Essential (primary) hypertension: Secondary | ICD-10-CM | POA: Diagnosis not present

## 2020-09-10 NOTE — Progress Notes (Signed)
Subjective:    Patient ID: Danielle Kirby, female    DOB: Aug 14, 1945, 75 y.o.   MRN: [id number]  HPI: Danielle Kirby is a 75 y.o. female who is here for [place] appointment for follow up of her TBI and Essential Hypertension. She was brought to [facility] ED via Paramedics on 07/26/2020 after multiple falls. Code Stroke was activated. Neurology and Neurosurgery was consulted.  CT Head:  IMPRESSION: 1. Small right thalamocapsular hypodensity could represent an age indeterminate lacunar infarct. MRI could further evaluate if clinically indicated. ASPECTS 9 at worst. 2. 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 Suspected trace hyperdensity posteriorly within the collection, concerning for acute/recent hemorrhage. Approximately 2 mm of rightward midline shift. 3. Right periorbital and pre maxillary soft tissue contusion. Fracture of all walls of the right maxillary sinus with layering hemosinus. Additional fractures of the right lateral orbital wall and orbital floor with herniation a small amount of fat through the orbital floor defect and involvement of the infraorbital foramen. Dedicated maxillofacial CT could further characterize if clinically indicated. 4. 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   Findings discussed with Dr. Curly Shores via telephone at 9:12 PM.   CT Angio:  IMPRESSION: 1. No emergent large vessel occlusion or hemodynamically significant proximal stenosis intracranially. 2. Approximately 50% stenosis of the proximal left ICA secondary due to predominately calcific atherosclerosis. 3. RAPID calculates a small (5 mL) area of penumbra in the posterior right occipital region, which may be artifactual. Otherwise, normal perfusion. 4. 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   Urgent  findings discussed with Dr. Curly Shores at 9:40 a.m. via telephone  Ct Spine:  IMPRESSION: No acute cervical spine fracture.  DG Right Forearm:  IMPRESSION: Moderately displaced distal right radial fracture with intra-articular extension. Mildly displaced ulnar styloid fracture.   MR Brain:  IMPRESSION: 1. No acute infarct. 2. 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 Possible trace superimposed recent hemorrhage was better evaluated on the same day CT head. Approximately 3 mm of resulting rightward midline shift. 3. Right cheek/periorbital contusion with facial fractures better characterized on same day CT head. 4. Moderate chronic microvascular ischemic disease. 5. Numerous small foci of susceptibility artifact within the posterior right temporal and right parieto-occipital region without associated edema or hyperdensity on same day CT head, compatible with prior microhemorrhages. The focality of this finding is atypical for hypertensive hemorrhages, amyloid angiopathy or prior emboli. Prior trauma is a differential consideration.  Danielle Kirby was instructed to call and sccheduled HFU F/U with Neurosurgery regarding TBI/SDH, she verbalizes understanding.   Danielle Kirby was admitted to inpatient rehabilitation on 08/02/2020 and discharged home on 08/30/2020. She is going to outpatient Therapy at [facility]. She denies any pain. She rates her pain 0. Also reports she has a good appetite.     Pain Inventory Average Pain 0 Pain Right Now 0 My pain is  no pain atm  LOCATION OF PAIN  No pain BOWEL Number of stools per week: 2 Oral laxative use No  Type of laxative n/a Enema or suppository use No  History of colostomy No  Incontinent No   BLADDER Normal In and out cath, frequency n/a Able to self cath Yes  Bladder incontinence No  Frequent urination No  Leakage with coughing No  Difficulty starting stream No  Incomplete bladder emptying No    Mobility walk without assistance use a cane how many minutes can you walk? 15-20 mins ability to climb steps?  yes do you drive?  no Do you have any goals in this area?  yes  Function retired  Neuro/Psych No problems in this area  Prior Studies n/a  Physicians involved in your care n/a   Family History  Problem Relation Age of Onset   Diabetes Mother    Hypertension Father    Social History   Socioeconomic History   Marital status: Soil scientist    Spouse name: Not on file   Number of children: Not on file   Years of education: Not on file   Highest education level: Not on file  Occupational History   Not on file  Tobacco Use   Smoking status: Never Smoker   Smokeless tobacco: Never Used  Substance and Sexual Activity   Alcohol use: No    Alcohol/week: 0.0 standard drinks   Drug use: No   Sexual activity: Yes    Birth control/protection: None  Other Topics Concern   Not on file  Social History Narrative   Not on file   Social Determinants of Health   Financial Resource Strain: Not on file  Food Insecurity: Not on file  Transportation Needs: Not on file  Physical Activity: Not on file  Stress: Not on file  Social Connections: Not on file   Past Surgical History:  Procedure Laterality Date   BREAST SURGERY     COSMETIC SURGERY     EYE SURGERY     TUBAL LIGATION     Past Medical History:  Diagnosis Date   Allergy    Arthritis    Cataract    Heart murmur    BP 113/77   Pulse 89   Temp 98.3 F (36.8 C)   Ht 5' 7" (1.702 m)   Wt 177 lb 4.8 oz (80.4 kg)   SpO2 98%   BMI 27.77 kg/m   Opioid Risk Score:   Fall Risk Score:  `1  Depression screen PHQ 2/9  Depression screen [facility] 2/9 06/04/2020 07/25/2019 03/01/2019 05/04/2018 09/08/2017 08/07/2017 07/31/2017  Decreased Interest 0 0 0 0 0 0 0  Down, Depressed, Hopeless 0 0 0 0 0 0 0  PHQ - 2 Score 0 0 0 0 0 0 0     Review  of Systems  Constitutional: Positive for unexpected weight change.  HENT: Negative.   Eyes: Negative.   Respiratory: Negative.   Cardiovascular: Negative.   Gastrointestinal: Positive for constipation.  Endocrine: Negative.   Genitourinary: Negative.   Musculoskeletal: Negative.   Skin: Negative.   Allergic/Immunologic: Negative.   Neurological: Negative.   Hematological: Negative.   Psychiatric/Behavioral: Negative.        Objective:   Physical Exam Vitals and nursing note reviewed.  Constitutional:      Appearance: Normal appearance.  Cardiovascular:     Rate and Rhythm: Normal rate and regular rhythm.  Musculoskeletal:     Cervical back: Normal range of motion and neck supple.     Comments: Normal Muscle Bulk and Muscle Testing Reveals:  Upper Extremities: Full ROM and Muscle Strength 5/5 Lower Extremities: Full ROM and Muscle Strength 5/5 Arises from Table with ease Narrow Based  Gait     Skin:    General: Skin is warm and dry.  Neurological:     Mental Status: She is alert and oriented  to person, place, and time.  Psychiatric:        Mood and Affect: Mood normal.        Behavior: Behavior normal.          Assessment & Plan:  TBI Continue Outpatient Therapy at [facility]. She was instructed to call to schedule HFU appointment with Dr Jackelyn Hoehn 2. Essential Hypertension. Continue current medication regimen. PCP Following. Continue to Monitor.   F/U with Dr Naaman Plummer in 4-6 weeks

## 2020-10-31 ENCOUNTER — Other Ambulatory Visit: Payer: Self-pay

## 2020-10-31 ENCOUNTER — Encounter
Payer: Federal, State, Local not specified - PPO | Attending: Registered Nurse | Admitting: Physical Medicine & Rehabilitation

## 2020-10-31 ENCOUNTER — Encounter: Payer: Self-pay | Admitting: Physical Medicine & Rehabilitation

## 2020-10-31 VITALS — BP 131/88 | Temp 98.7°F | Ht 67.0 in | Wt 185.0 lb

## 2020-10-31 DIAGNOSIS — S069X0D Unspecified intracranial injury without loss of consciousness, subsequent encounter: Secondary | ICD-10-CM | POA: Diagnosis not present

## 2020-10-31 MED ORDER — AMLODIPINE BESYLATE 5 MG PO TABS: 5.0000 mg | ORAL_TABLET | Freq: Every day | ORAL | 5 refills | Status: DC

## 2020-10-31 NOTE — Patient Instructions (Addendum)
TAKE YOUR TIME. START SLOW, YOU'RE BALANCE AND SPEED OF WALKING WILL IMPROVE WITH TIME.   RETURN TO DRIVING PLAN:  WITH [name]  WITH THE SUPERVISION OF A LICENSED DRIVER, PLEASE DRIVE IN AN EMPTY PARKING LOT FOR AT LEAST 2-3 TRIALS TO TEST REACTION TIME, VISION, USE OF EQUIPMENT IN CAR, ETC.  IF SUCCESSFUL WITH THE PARKING LOT DRIVING, PROCEED TO SUPERVISED DRIVING TRIALS IN YOUR NEIGHBORHOOD STREETS AT LOW TRAFFIC TIMES TO TEST OBSERVATION TO TRAFFIC SIGNALS, REACTION TIME, ETC. PLEASE ATTEMPT AT LEAST 2-3 TRIALS IN YOUR NEIGHBORHOOD.  IF NEIGHBORHOOD DRIVING IS SUCCESSFUL, YOU MAY PROCEED TO DRIVING IN BUSIER AREAS IN YOUR COMMUNITY WITH SUPERVISION OF A LICENSED DRIVER. PLEASE ATTEMPT AT LEAST 4-5 TRIALS.  IF COMMUNITY DRIVING IS SUCCESSFUL, YOU MAY PROCEED TO DRIVING ALONE, DURING THE DAY TIME, IN NON-PEAK TRAFFIC TIMES. YOU SHOULD DRIVE NO FURTHER THAN 20 MINUTES IN ONE DIRECTION. PLEASE DO NOT DRIVE IF YOU FEEL FATIGUED OR UNDER THE INFLUENCE OF MEDICATION.

## 2020-10-31 NOTE — Progress Notes (Signed)
Subjective:    Patient ID: GENESEE NASE, female    DOB: 08-11-1945, 75 y.o.   MRN: [id number]  HPI  Ms. Corlis Leak is here in follow-up of her inpatient rehab stay after a traumatic subdural hematoma. She has been doing well since home. She is a little upset that she wasn't given information about the early parts of her stay and that she felt she was retrained or held back against her wishes. (I assume that this was early in her stay).   She feels that her balance is still a little off at home. She stumbles at times but hasn't fallen. She doesn't use an adaptive device. She's careful on the stairs.   Her sleep has been solid.  Her mood has been positive. She would like to get out more and socialize. She wants to drive.  Pain Inventory Average Pain 0 Pain Right Now 0 My pain is  no pain   In the last 24 hours, has pain interfered with the following? General activity 0 Relation with others 0 Enjoyment of life 0 What TIME of day is your pain at its worst? varies Sleep (in general) Good  Pain is worse with:  no pain Pain improves with:  no pain  Relief from Meds:  no pain   Family History  Problem Relation Age of Onset   Diabetes Mother    Hypertension Father    Social History   Socioeconomic History   Marital status: Media planner    Spouse name: Not on file   Number of children: Not on file   Years of education: Not on file   Highest education level: Not on file  Occupational History   Not on file  Tobacco Use   Smoking status: Never   Smokeless tobacco: Never  Substance and Sexual Activity   Alcohol use: No    Alcohol/week: 0.0 standard drinks   Drug use: No   Sexual activity: Yes    Birth control/protection: None  Other Topics Concern   Not on file  Social History Narrative   Not on file   Social Determinants of Health   Financial Resource Strain: Not on file  Food Insecurity: Not on file  Transportation Needs: Not on file  Physical Activity: Not on  file  Stress: Not on file  Social Connections: Not on file   Past Surgical History:  Procedure Laterality Date   BREAST SURGERY     COSMETIC SURGERY     EYE SURGERY     TUBAL LIGATION     Past Surgical History:  Procedure Laterality Date   BREAST SURGERY     COSMETIC SURGERY     EYE SURGERY     TUBAL LIGATION     Past Medical History:  Diagnosis Date   Allergy    Arthritis    Cataract    Heart murmur    BP 131/88 (BP Location: Right Arm)   Temp 98.7 F (37.1 C) (Oral)   Ht 5\' 7"  (1.702 m)   Wt 185 lb (83.9 kg)   SpO2 98%   BMI 28.98 kg/m   Opioid Risk Score:   Fall Risk Score:  `1  Depression screen PHQ 2/9  Depression screen [facility] 2/9 09/10/2020 06/04/2020 07/25/2019 03/01/2019 05/04/2018 09/08/2017 08/07/2017  Decreased Interest 0 0 0 0 0 0 0  Down, Depressed, Hopeless 0 0 0 0 0 0 0  PHQ - 2 Score 0 0 0 0 0 0 0  Altered sleeping 0 - - - - - -  Tired, decreased energy 0 - - - - - -  Change in appetite 0 - - - - - -  Feeling bad or failure about yourself  0 - - - - - -  Trouble concentrating 0 - - - - - -  Moving slowly or fidgety/restless 0 - - - - - -  Suicidal thoughts 0 - - - - - -  PHQ-9 Score 0 - - - - - -       Review of Systems  Constitutional: Negative.   HENT: Negative.    Eyes: Negative.   Respiratory: Negative.    Cardiovascular: Negative.   Gastrointestinal: Negative.   Endocrine: Negative.   Genitourinary: Negative.   Musculoskeletal: Negative.   Skin: Negative.   Allergic/Immunologic: Negative.   Neurological: Negative.   Hematological: Negative.   Psychiatric/Behavioral: Negative.        Objective:   Physical Exam Gen: no distress, normal appearing HEENT: oral mucosa pink and moist, NCAT Cardio: Reg rate Chest: normal effort, normal rate of breathing Abd: soft, non-distended Ext: no edema Psych: pleasant, a little impulsive Skin: intact Neuro:  Alert and oriented x 3. Normal insight and awareness. Intact Memory. Normal language  and speech. Cranial nerve exam unremarkable. Motor 5/5. Normal sensory. Gait narrow based.  Musculoskeletal: Full ROM, No pain with AROM or PROM in the neck, trunk, or extremities. Posture appropriate. 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  Patient was discharged from rehab at the end of May.  -improved, she was discharged from neuro-rehab  -gave instructions to return to drive during the day.  -take time with gait, increase speed slowly with time Mood/behavior: pt feels her mood is at baseline.  -encouraged socialization as possible History of left calf DVT diagnosed while in rehab History of right maxillary sinus, right lateral orbital wall, and orbital floor fractures Right distal comminuted radius fracture followed by orthopedic surgery Pain management--no pain currently Sleep disturbance: resolved. Off meds. Sleeping well now.  Fifteen minutes of face to face patient care time were spent during this visit. All questions were encouraged and answered.  Follow up with me PRN .

## 2021-01-10 ENCOUNTER — Telehealth (INDEPENDENT_AMBULATORY_CARE_PROVIDER_SITE_OTHER): Payer: Federal, State, Local not specified - PPO | Admitting: Emergency Medicine

## 2021-01-10 ENCOUNTER — Other Ambulatory Visit: Payer: Self-pay

## 2021-01-10 ENCOUNTER — Encounter: Payer: Self-pay | Admitting: Emergency Medicine

## 2021-01-10 DIAGNOSIS — B9789 Other viral agents as the cause of diseases classified elsewhere: Secondary | ICD-10-CM

## 2021-01-10 DIAGNOSIS — J398 Other specified diseases of upper respiratory tract: Secondary | ICD-10-CM

## 2021-01-10 DIAGNOSIS — J988 Other specified respiratory disorders: Secondary | ICD-10-CM

## 2021-01-10 NOTE — Progress Notes (Signed)
Telemedicine Encounter- SOAP NOTE Established Patient Patient: Home  Provider: Office   Patient present only  This telephone encounter was conducted with the patient's (or proxy's) verbal consent via audio telecommunications: yes/no: Yes Patient was instructed to have this encounter in a suitably private space; and to only have persons present to whom they give permission to participate. In addition, patient identity was confirmed by use of name plus two identifiers (DOB and address).  I discussed the limitations, risks, security and privacy concerns of performing an evaluation and management service by telephone and the availability of in person appointments. I also discussed with the patient that there may be a patient responsible charge related to this service. The patient expressed understanding and agreed to proceed.  I spent a total of TIME; 0 MIN TO 60 MIN: 10 minutes talking with the patient or their proxy.  Chief complaint: Congestion.  Subjective   Danielle Kirby is a 75 y.o. female established patient. Telephone visit today complaining of flulike symptoms that started 2 days ago and today is much better.  Still having some congestion but denies difficulty breathing, fever, chills, chest pain, nausea or vomiting or any other associated symptoms.  Overall feels better. No other complaints or medical concerns today.  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     amLODipine (NORVASC) 5 MG tablet Take 1 tablet (5 mg total) by mouth daily. 30 tablet 5   docusate sodium (COLACE) 100 MG capsule Take 1 capsule (100 mg total) by mouth 2 (two) times daily. (Patient not taking: Reported on 10/31/2020) 10 capsule 0   Multiple Vitamin (MULTIVITAMIN WITH MINERALS) TABS tablet Take 1 tablet by mouth daily.     No current facility-administered medications for this visit.    Allergies  Allergen Reactions   Penicillins     REACTION: hives    Social History   Socioeconomic History   Marital status: Media planner    Spouse name: Not on file   Number of children: Not on file   Years of education: Not on file   Highest education level: Not on file  Occupational History   Not on file  Tobacco Use   Smoking status: Never   Smokeless tobacco: Never  Substance and Sexual Activity   Alcohol use: No    Alcohol/week: 0.0 standard drinks   Drug use: No   Sexual activity: Yes    Birth control/protection: None  Other Topics Concern   Not on file  Social History Narrative   Not on file   Social Determinants of Health   Financial Resource Strain:  Not on file  Food Insecurity: Not on file  Transportation Needs: Not on file  Physical Activity: Not on file  Stress: Not on file  Social Connections: Not on file  Intimate Partner Violence: Not on file    Review of Systems  Constitutional: Negative.  Negative for chills and fever.  HENT:  Positive for congestion. Negative for sore throat.   Respiratory: Negative.  Negative for cough and shortness of breath.   Cardiovascular: Negative.  Negative for chest pain and palpitations.  Gastrointestinal:  Negative for abdominal pain, diarrhea, nausea and vomiting.  Genitourinary:  Negative.  Negative for dysuria.  Skin: Negative.  Negative for rash.  Neurological: Negative.  Negative for dizziness and headaches.  All other systems reviewed and are negative.  Objective  Alert and oriented x3 in no apparent respiratory distress Vitals as reported by the patient: There were no vitals filed for this visit. Diagnoses and all orders for this visit:  Viral respiratory illness  Congestion of upper respiratory tract    Clinically stable.  No medical concerns identified during this visit.  No red flag signs or symptoms.  No signs of COVID illness. Advised to take over-the-counter medications, rest, drink plenty fluids and contact the office if no better or worse during the next several days.  ED precautions given. I discussed the assessment and treatment plan with the patient. The patient was provided an opportunity to ask questions and all were answered. The patient agreed with the plan and demonstrated an understanding of the instructions.   The patient was advised to call back or seek an in-person evaluation if the symptoms worsen or if the condition fails to improve as anticipated.  I provided 10 minutes of non-face-to-face time during this encounter.  Georgina Quint, MD  Primary Care at [facility]

## 2021-04-02 ENCOUNTER — Other Ambulatory Visit: Payer: Self-pay

## 2021-04-02 ENCOUNTER — Other Ambulatory Visit (HOSPITAL_COMMUNITY): Payer: Self-pay

## 2021-04-03 ENCOUNTER — Other Ambulatory Visit (HOSPITAL_COMMUNITY): Payer: Self-pay

## 2021-04-08 ENCOUNTER — Other Ambulatory Visit (HOSPITAL_COMMUNITY): Payer: Self-pay

## 2021-04-08 ENCOUNTER — Other Ambulatory Visit: Payer: Self-pay

## 2021-04-15 ENCOUNTER — Other Ambulatory Visit: Payer: Self-pay

## 2021-04-15 ENCOUNTER — Encounter: Payer: Self-pay | Admitting: Emergency Medicine

## 2021-04-15 ENCOUNTER — Ambulatory Visit: Payer: Federal, State, Local not specified - PPO | Admitting: Emergency Medicine

## 2021-04-15 VITALS — BP 192/90 | HR 74 | Ht 67.0 in | Wt 195.0 lb

## 2021-04-15 DIAGNOSIS — I10 Essential (primary) hypertension: Secondary | ICD-10-CM

## 2021-04-15 DIAGNOSIS — R011 Cardiac murmur, unspecified: Secondary | ICD-10-CM | POA: Diagnosis not present

## 2021-04-15 DIAGNOSIS — Z23 Encounter for immunization: Secondary | ICD-10-CM

## 2021-04-15 LAB — CBC WITH DIFFERENTIAL/PLATELET
Basophils Absolute: 0 10*3/uL (ref 0.0–0.1)
Basophils Relative: 0.8 % (ref 0.0–3.0)
Eosinophils Absolute: 0.1 10*3/uL (ref 0.0–0.7)
Eosinophils Relative: 2.5 % (ref 0.0–5.0)
HCT: 38.6 % (ref 36.0–46.0)
Hemoglobin: 11.9 g/dL — ABNORMAL LOW (ref 12.0–15.0)
Lymphocytes Relative: 36 % (ref 12.0–46.0)
Lymphs Abs: 1.6 10*3/uL (ref 0.7–4.0)
MCHC: 31 g/dL (ref 30.0–36.0)
MCV: 85.2 fl (ref 78.0–100.0)
Monocytes Absolute: 0.3 10*3/uL (ref 0.1–1.0)
Monocytes Relative: 5.9 % (ref 3.0–12.0)
Neutro Abs: 2.4 10*3/uL (ref 1.4–7.7)
Neutrophils Relative %: 54.8 % (ref 43.0–77.0)
Platelets: 201 10*3/uL (ref 150.0–400.0)
RBC: 4.52 Mil/uL (ref 3.87–5.11)
RDW: 15.4 % (ref 11.5–15.5)
WBC: 4.4 10*3/uL (ref 4.0–10.5)

## 2021-04-15 LAB — COMPREHENSIVE METABOLIC PANEL
ALT: 11 U/L (ref 0–35)
AST: 16 U/L (ref 0–37)
Albumin: 4.2 g/dL (ref 3.5–5.2)
Alkaline Phosphatase: 88 U/L (ref 39–117)
BUN: 12 mg/dL (ref 6–23)
CO2: 27 mEq/L (ref 19–32)
Calcium: 9.9 mg/dL (ref 8.4–10.5)
Chloride: 106 mEq/L (ref 96–112)
Creatinine, Ser: 0.66 mg/dL (ref 0.40–1.20)
GFR: 85.81 mL/min (ref >60.00)
Glucose, Bld: 87 mg/dL (ref 70–99)
Potassium: 3.8 mEq/L (ref 3.5–5.1)
Sodium: 139 mEq/L (ref 135–145)
Total Bilirubin: 0.4 mg/dL (ref 0.2–1.2)
Total Protein: 7.7 g/dL (ref 6.0–8.3)

## 2021-04-15 LAB — LIPID PANEL
Cholesterol: 214 mg/dL — ABNORMAL HIGH (ref 0–200)
HDL: 70.1 mg/dL (ref >39.00)
LDL Cholesterol: 128 mg/dL — ABNORMAL HIGH (ref 0–99)
NonHDL: 144.11
Total CHOL/HDL Ratio: 3
Triglycerides: 80 mg/dL (ref 0.0–149.0)
VLDL: 16 mg/dL (ref 0.0–40.0)

## 2021-04-15 MED ORDER — AMLODIPINE BESYLATE 10 MG PO TABS: 10.0000 mg | ORAL_TABLET | Freq: Every day | ORAL | 3 refills | Status: DC

## 2021-04-15 NOTE — Patient Instructions (Signed)

## 2021-04-15 NOTE — Progress Notes (Addendum)
Danielle Kirby 76 y.o.   Chief Complaint  Patient presents with   Hypertension    Pt states she was in the ED and was given BP medication, unsure if she was pose to continue taking medication    HISTORY OF PRESENT ILLNESS: This is a 76 y.o. female with history of hypertension here for follow-up. Has been off blood pressure medication for the past several months. Denies chest pain, difficulty breathing, dyspnea on exertion, headaches, or any other significant symptoms. BP Readings from Last 3 Encounters:  10/31/20 131/88  09/10/20 113/77  08/30/20 133/70     Hypertension Pertinent negatives include no chest pain, headaches, palpitations or shortness of breath.    Prior to Admission medications   Medication Sig Start Date End Date Taking? Authorizing Provider  Multiple Vitamin (MULTIVITAMIN WITH MINERALS) TABS tablet Take 1 tablet by mouth daily. 08/29/20  Yes Angiulli, Lavon Paganini, PA-C  acetaminophen (TYLENOL) 325 MG tablet Take 1-2 tablets (325-650 mg total) by mouth every 4 (four) hours as needed for mild pain. Patient not taking: Reported on 04/15/2021 08/29/20   Angiulli, Lavon Paganini, PA-C  amLODipine (NORVASC) 5 MG tablet Take 1 tablet (5 mg total) by mouth daily. Patient not taking: Reported on 04/15/2021 10/31/20   Meredith Staggers, MD  docusate sodium (COLACE) 100 MG capsule Take 1 capsule (100 mg total) by mouth 2 (two) times daily. 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   Degenerative arthritis of hip 05/23/2011    Past Medical History:  Diagnosis Date   Allergy    Arthritis    Cataract    Heart murmur     Past Surgical History:  Procedure Laterality Date   BREAST SURGERY     COSMETIC SURGERY     EYE SURGERY     TUBAL LIGATION      Social History   Socioeconomic History   Marital status: Significant Other    Spouse name: Not on file   Number of children: Not on file   Years of education: Not on file   Highest education level: Not on file  Occupational History   Not on file  Tobacco Use   Smoking status: Never   Smokeless tobacco: Never  Substance and Sexual Activity   Alcohol use: No    Alcohol/week: 0.0 standard drinks   Drug use: No   Sexual activity: Yes    Birth control/protection: None  Other Topics Concern   Not on file  Social History Narrative   Not on file   Social Determinants of Health   Financial Resource Strain: Not on file  Food Insecurity: Not on file  Transportation Needs: Not on file  Physical Activity: Not on file  Stress: Not on file  Social Connections: Not on file  Intimate Partner Violence: Not on file    Family  History  Problem Relation Age of Onset   Diabetes Mother    Hypertension Father      Review of Systems  Constitutional: Negative.  Negative for chills and fever.  HENT: Negative.  Negative for congestion and sore throat.   Respiratory: Negative.  Negative for cough and shortness of breath.   Cardiovascular: Negative.  Negative for chest pain and palpitations.  Gastrointestinal:  Negative for abdominal pain, diarrhea, nausea and vomiting.  Genitourinary:  Negative for hematuria.  Skin: Negative.  Negative for rash.  Neurological: Negative.  Negative for dizziness and headaches.  All other systems reviewed and are negative.  Today's Vitals   04/15/21 1528 04/15/21 1539  BP: (!) 188/89 (!) 192/90  Pulse: 74   SpO2: 99%   Weight:  195 lb (88.5 kg)   Height: 5\' 7"  (1.702 m)    Body mass index is 30.54 kg/m.  Physical Exam Vitals reviewed.  Constitutional:      Appearance: Normal appearance.  HENT:     Head: Normocephalic.  Eyes:     Extraocular Movements: Extraocular movements intact.     Pupils: Pupils are equal, round, and reactive to light.  Cardiovascular:     Rate and Rhythm: Normal rate and regular rhythm.     Heart sounds: Murmur (Systolic 3/6 at aortic area) heard.  Pulmonary:     Effort: Pulmonary effort is normal.     Breath sounds: Normal breath sounds.  Skin:    General: Skin is warm and dry.     Capillary Refill: Capillary refill takes less than 2 seconds.  Neurological:     General: No focal deficit present.     Mental Status: She is alert and oriented to person, place, and time.  Psychiatric:        Mood and Affect: Mood normal.        Behavior: Behavior normal.    EKG: Normal sinus rhythm with ventricular rate of 71.  No acute ischemic changes.  Minimal voltage criteria for LVH. ASSESSMENT & PLAN: A total of 49 minutes was spent with the patient and counseling/coordination of care regarding preparing for this visit, review of most recent office visit notes, review of all medications, review of most recent blood work results, cardiovascular risks associated with uncontrolled hypertension, treatment of hypertension with amlodipine 10 mg daily, review of EKG, education on nutrition, prognosis, documentation and need for follow-up in 4 weeks.  Problem List Items Addressed This Visit       Cardiovascular and Mediastinum   Essential hypertension    Uncontrolled hypertension, off medications for several months. Will restart amlodipine at 10 mg daily. EKG does not show any acute ischemic changes. Advised to monitor blood pressure readings at home daily for the next several weeks and keep a log. Dietary approaches to stop hypertension discussed. Follow-up in 4 weeks. Blood work done  today.      Relevant Medications   amLODipine (NORVASC) 10 MG tablet   Other Visit Diagnoses     Uncontrolled hypertension    -  Primary   Relevant Medications   amLODipine (NORVASC) 10 MG tablet   Other Relevant Orders   CBC with Differential/Platelet (Completed)   Comprehensive metabolic panel (Completed)   Lipid panel (Completed)   Need for influenza vaccination       Cardiac murmur            Patient Instructions  Hypertension, Adult High blood pressure (hypertension) is when the force of blood pumping through the arteries  is too strong. The arteries are the blood vessels that carry blood from the heart throughout the body. Hypertension forces the heart to work harder to pump blood and may cause arteries to become narrow or stiff. Untreated or uncontrolled hypertension can cause a heart attack, heart failure, a stroke, kidney disease, and other problems. A blood pressure reading consists of a higher number over a lower number. Ideally, your blood pressure should be below 120/80. The first ("top") number is called the systolic pressure. It is a measure of the pressure in your arteries as your heart beats. The second ("bottom") number is called the diastolic pressure. It is a measure of the pressure in your arteries as the heart relaxes. What are the causes? The exact cause of this condition is not known. There are some conditions that result in or are related to high blood pressure. What increases the risk? Some risk factors for high blood pressure are under your control. The following factors may make you more likely to develop this condition: Smoking. Having type 2 diabetes mellitus, high cholesterol, or both. Not getting enough exercise or physical activity. Being overweight. Having too much fat, sugar, calories, or salt (sodium) in your diet. Drinking too much alcohol. Some risk factors for high blood pressure may be difficult or impossible to change. Some of these factors  include: Having chronic kidney disease. Having a family history of high blood pressure. Age. Risk increases with age. Race. You may be at higher risk if you are African American. Gender. Men are at higher risk than women before age 82. After age 63, women are at higher risk than men. Having obstructive sleep apnea. Stress. What are the signs or symptoms? High blood pressure may not cause symptoms. Very high blood pressure (hypertensive crisis) may cause: Headache. Anxiety. Shortness of breath. Nosebleed. Nausea and vomiting. Vision changes. Severe chest pain. Seizures. How is this diagnosed? This condition is diagnosed by measuring your blood pressure while you are seated, with your arm resting on a flat surface, your legs uncrossed, and your feet flat on the floor. The cuff of the blood pressure monitor will be placed directly against the skin of your upper arm at the level of your heart. It should be measured at least twice using the same arm. Certain conditions can cause a difference in blood pressure between your right and left arms. Certain factors can cause blood pressure readings to be lower or higher than normal for a short period of time: When your blood pressure is higher when you are in a health care provider's office than when you are at home, this is called white coat hypertension. Most people with this condition do not need medicines. When your blood pressure is higher at home than when you are in a health care provider's office, this is called masked hypertension. Most people with this condition may need medicines to control blood pressure. If you have a high blood pressure reading during one visit or you have normal blood pressure with other risk factors, you may be asked to: Return on a different day to have your blood pressure checked again. Monitor your blood pressure at home for 1 week or longer. If you are diagnosed with hypertension, you may have other blood or imaging  tests to help your health care provider understand your overall risk for other conditions. How is this treated? This condition is treated by making healthy lifestyle changes, such as eating healthy foods, exercising more, and reducing your alcohol intake.  Your health care provider may prescribe medicine if lifestyle changes are not enough to get your blood pressure under control, and if: Your systolic blood pressure is above 130. Your diastolic blood pressure is above 80. Your personal target blood pressure may vary depending on your medical conditions, your age, and other factors. Follow these instructions at home: Eating and drinking  Eat a diet that is high in fiber and potassium, and low in sodium, added sugar, and fat. An example eating plan is called the DASH (Dietary Approaches to Stop Hypertension) diet. To eat this way: Eat plenty of fresh fruits and vegetables. Try to fill one half of your plate at each meal with fruits and vegetables. Eat whole grains, such as whole-wheat pasta, brown rice, or whole-grain bread. Fill about one fourth of your plate with whole grains. Eat or drink low-fat dairy products, such as skim milk or low-fat yogurt. Avoid fatty cuts of meat, processed or cured meats, and poultry with skin. Fill about one fourth of your plate with lean proteins, such as fish, chicken without skin, beans, eggs, or tofu. Avoid pre-made and processed foods. These tend to be higher in sodium, added sugar, and fat. Reduce your daily sodium intake. Most people with hypertension should eat less than 1,500 mg of sodium a day. Do not drink alcohol if: Your health care provider tells you not to drink. You are pregnant, may be pregnant, or are planning to become pregnant. If you drink alcohol: Limit how much you use to: 0-1 drink a day for women. 0-2 drinks a day for men. Be aware of how much alcohol is in your drink. In the [place] one drink equals one 12 oz bottle of beer (355 mL), one 5  oz glass of wine (148 mL), or one 1 oz glass of hard liquor (44 mL). Lifestyle  Work with your health care provider to maintain a healthy body weight or to lose weight. Ask what an ideal weight is for you. Get at least 30 minutes of exercise most days of the week. Activities may include walking, swimming, or biking. Include exercise to strengthen your muscles (resistance exercise), such as Pilates or lifting weights, as part of your weekly exercise routine. Try to do these types of exercises for 30 minutes at least 3 days a week. Do not use any products that contain nicotine or tobacco, such as cigarettes, e-cigarettes, and chewing tobacco. If you need help quitting, ask your health care provider. Monitor your blood pressure at home as told by your health care provider. Keep all follow-up visits as told by your health care provider. This is important. Medicines Take over-the-counter and prescription medicines only as told by your health care provider. Follow directions carefully. Blood pressure medicines must be taken as prescribed. Do not skip doses of blood pressure medicine. Doing this puts you at risk for problems and can make the medicine less effective. Ask your health care provider about side effects or reactions to medicines that you should watch for. Contact a health care provider if you: Think you are having a reaction to a medicine you are taking. Have headaches that keep coming back (recurring). Feel dizzy. Have swelling in your ankles. Have trouble with your vision. Get help right away if you: Develop a severe headache or confusion. Have unusual weakness or numbness. Feel faint. Have severe pain in your chest or abdomen. Vomit repeatedly. Have trouble breathing. Summary Hypertension is when the force of blood pumping through your arteries is too strong.  If this condition is not controlled, it may put you at risk for serious complications. Your personal target blood pressure  may vary depending on your medical conditions, your age, and other factors. For most people, a normal blood pressure is less than 120/80. Hypertension is treated with lifestyle changes, medicines, or a combination of both. Lifestyle changes include losing weight, eating a healthy, low-sodium diet, exercising more, and limiting alcohol. This information is not intended to replace advice given to you by your health care provider. Make sure you discuss any questions you have with your health care provider. Document Revised: 12/02/2017 Document Reviewed: 12/02/2017 Elsevier Patient Education  2022 [place], MD [facility]

## 2021-04-15 NOTE — Assessment & Plan Note (Addendum)
Uncontrolled hypertension, off medications for several months. Will restart amlodipine at 10 mg daily. EKG does not show any acute ischemic changes. Advised to monitor blood pressure readings at home daily for the next several weeks and keep a log. Dietary approaches to stop hypertension discussed. Follow-up in 4 weeks. Blood work done today.

## 2021-04-16 ENCOUNTER — Other Ambulatory Visit: Payer: Self-pay | Admitting: Emergency Medicine

## 2021-04-16 DIAGNOSIS — E785 Hyperlipidemia, unspecified: Secondary | ICD-10-CM

## 2021-04-16 MED ORDER — ROSUVASTATIN CALCIUM 10 MG PO TABS: 10.0000 mg | ORAL_TABLET | Freq: Every day | ORAL | 3 refills | Status: DC

## 2021-04-16 NOTE — Progress Notes (Signed)
Abnormal lipid profile. Lab Results  Component Value Date   CHOL 214 (H) 04/15/2021   HDL 70.10 04/15/2021   LDLCALC 128 (H) 04/15/2021   TRIG 80.0 04/15/2021   CHOLHDL 3 04/15/2021   The 10-year ASCVD risk score (Arnett DK, et al., 2019) is: 30.3%   Values used to calculate the score:     Age: 76 years     Sex: Female     Is Non-Hispanic African American: Yes     Diabetic: No     Tobacco smoker: No     Systolic Blood Pressure: 192 mmHg     Is BP treated: Yes     HDL Cholesterol: 70.1 mg/dL     Total Cholesterol: 214 mg/dL We will start rosuvastatin 10 mg daily.

## 2021-04-17 ENCOUNTER — Other Ambulatory Visit (HOSPITAL_COMMUNITY): Payer: Self-pay

## 2021-05-15 ENCOUNTER — Other Ambulatory Visit: Payer: Self-pay

## 2021-05-15 ENCOUNTER — Ambulatory Visit: Payer: Federal, State, Local not specified - PPO | Admitting: Emergency Medicine

## 2021-05-15 ENCOUNTER — Encounter: Payer: Self-pay | Admitting: Emergency Medicine

## 2021-05-15 VITALS — BP 138/78 | HR 76 | Temp 97.7°F | Ht 67.0 in | Wt 199.4 lb

## 2021-05-15 DIAGNOSIS — Z23 Encounter for immunization: Secondary | ICD-10-CM

## 2021-05-15 DIAGNOSIS — E785 Hyperlipidemia, unspecified: Secondary | ICD-10-CM | POA: Diagnosis not present

## 2021-05-15 DIAGNOSIS — I10 Essential (primary) hypertension: Secondary | ICD-10-CM | POA: Diagnosis not present

## 2021-05-15 NOTE — Assessment & Plan Note (Signed)
Stable.  Diet and nutrition discussed. Continue rosuvastatin 10 mg daily. The 10-year ASCVD risk score (Arnett DK, et al., 2019) is: 19.7%   Values used to calculate the score:     Age: 76 years     Sex: Female     Is Non-Hispanic African American: Yes     Diabetic: No     Tobacco smoker: No     Systolic Blood Pressure: 138 mmHg     Is BP treated: Yes     HDL Cholesterol: 70.1 mg/dL     Total Cholesterol: 214 mg/dL

## 2021-05-15 NOTE — Assessment & Plan Note (Signed)
Well-controlled hypertension.  Continue amlodipine 10 mg daily. Dietary approaches to stop hypertension discussed. Cardiovascular risks associated with uncontrolled hypertension discussed.

## 2021-05-15 NOTE — Progress Notes (Signed)
Danielle Kirby 76 y.o.   Chief Complaint  Patient presents with   Follow-up    HISTORY OF PRESENT ILLNESS: This is a 77 y.o. female here for 4-week follow-up of uncontrolled hypertension. Started on amlodipine 10 mg daily. Blood work showed increased cholesterol.  Started on rosuvastatin 10 mg daily. Doing well.  Has no complaints or medical concerns today. BP Readings from Last 3 Encounters:  05/15/21 (!) 144/86  04/15/21 (!) 192/90  10/31/20 131/88     HPI   Prior to Admission medications   Medication Sig Start Date End Date Taking? Authorizing Provider  amLODipine (NORVASC) 10 MG tablet Take 1 tablet (10 mg total) by mouth daily. 04/15/21  Yes Passion Lavin, Ines Bloomer, MD  Multiple Vitamin (MULTIVITAMIN WITH MINERALS) TABS tablet Take 1 tablet by mouth daily. 08/29/20  Yes Angiulli, Lavon Paganini, PA-C  rosuvastatin (CRESTOR) 10 MG tablet Take 1 tablet (10 mg total) by mouth daily. 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   Degenerative arthritis of hip 05/23/2011    Past Medical History:  Diagnosis Date   Allergy    Arthritis    Cataract    Heart murmur     Past Surgical History:  Procedure Laterality Date   BREAST SURGERY     COSMETIC SURGERY     EYE SURGERY     TUBAL LIGATION      Social History   Socioeconomic History   Marital status: Significant Other    Spouse name: Not on file   Number of children: Not on file   Years of education: Not on file   Highest education level: Not on file  Occupational  History   Not on file  Tobacco Use   Smoking status: Never   Smokeless tobacco: Never  Substance and Sexual Activity   Alcohol use: No    Alcohol/week: 0.0 standard drinks   Drug use: No   Sexual activity: Yes    Birth control/protection: None  Other Topics Concern   Not on file  Social History Narrative   Not on file   Social Determinants of Health   Financial Resource Strain: Not on file  Food Insecurity: Not on file  Transportation Needs: Not on file  Physical Activity: Not on file  Stress: Not on file  Social Connections: Not on file  Intimate Partner Violence: Not on file    Family History  Problem Relation Age of Onset   Diabetes Mother    Hypertension Father      Review of Systems  Constitutional: Negative.  Negative for chills and fever.  All other systems reviewed and are negative.  Today's Vitals   05/15/21 1459 05/15/21 1540  BP: (!) 144/86 138/78  Pulse: 76   Temp: 97.7 F (36.5 C)   TempSrc: Oral   SpO2: 98%   Weight: 199 lb 6 oz (90.4 kg)   Height: 5\' 7"  (1.702 m)    Body mass index is  31.23 kg/m.  Physical Exam Vitals reviewed.  Constitutional:      Appearance: Normal appearance.  HENT:     Head: Normocephalic.  Eyes:     Extraocular Movements: Extraocular movements intact.     Pupils: Pupils are equal, round, and reactive to light.  Cardiovascular:     Rate and Rhythm: Normal rate and regular rhythm.     Heart sounds: Murmur (Systolic 3/6 best heard at aortic area) heard.  Pulmonary:     Effort: Pulmonary effort is normal.     Breath sounds: Normal breath sounds.  Musculoskeletal:     Cervical back: No tenderness.  Lymphadenopathy:     Cervical: No cervical adenopathy.  Skin:    General: Skin is warm and dry.     Capillary Refill: Capillary refill takes less than 2 seconds.  Neurological:     General: No focal deficit present.     Mental Status: She is alert and oriented to person, place, and time.  Psychiatric:        Mood  and Affect: Mood normal.        Behavior: Behavior normal.     ASSESSMENT & PLAN: Problem List Items Addressed This Visit       Cardiovascular and Mediastinum   Essential hypertension - Primary    Well-controlled hypertension.  Continue amlodipine 10 mg daily. Dietary approaches to stop hypertension discussed. Cardiovascular risks associated with uncontrolled hypertension discussed.        Other   Dyslipidemia    Stable.  Diet and nutrition discussed. Continue rosuvastatin 10 mg daily. The 10-year ASCVD risk score (Arnett DK, et al., 2019) is: 19.7%   Values used to calculate the score:     Age: 23 years     Sex: Female     Is Non-Hispanic African American: Yes     Diabetic: No     Tobacco smoker: No     Systolic Blood Pressure: 0000000 mmHg     Is BP treated: Yes     HDL Cholesterol: 70.1 mg/dL     Total Cholesterol: 214 mg/dL       Patient Instructions  Hypertension, Adult High blood pressure (hypertension) is when the force of blood pumping through the arteries is too strong. The arteries are the blood vessels that carry blood from the heart throughout the body. Hypertension forces the heart to work harder to pump blood and may cause arteries to become narrow or stiff. Untreated or uncontrolled hypertension can cause a heart attack, heart failure, a stroke, kidney disease, and other problems. A blood pressure reading consists of a higher number over a lower number. Ideally, your blood pressure should be below 120/80. The first ("top") number is called the systolic pressure. It is a measure of the pressure in your arteries as your heart beats. The second ("bottom") number is called the diastolic pressure. It is a measure of the pressure in your arteries as the heart relaxes. What are the causes? The exact cause of this condition is not known. There are some conditions that result in or are related to high blood pressure. What increases the risk? Some risk factors for high  blood pressure are under your control. The following factors may make you more likely to develop this condition: Smoking. Having type 2 diabetes mellitus, high cholesterol, or both. Not getting enough exercise or physical activity. Being overweight. Having too much fat, sugar, calories, or salt (sodium) in your diet. Drinking too much alcohol. Some risk factors for  high blood pressure may be difficult or impossible to change. Some of these factors include: Having chronic kidney disease. Having a family history of high blood pressure. Age. Risk increases with age. Race. You may be at higher risk if you are African American. Gender. Men are at higher risk than women before age 14. After age 65, women are at higher risk than men. Having obstructive sleep apnea. Stress. What are the signs or symptoms? High blood pressure may not cause symptoms. Very high blood pressure (hypertensive crisis) may cause: Headache. Anxiety. Shortness of breath. Nosebleed. Nausea and vomiting. Vision changes. Severe chest pain. Seizures. How is this diagnosed? This condition is diagnosed by measuring your blood pressure while you are seated, with your arm resting on a flat surface, your legs uncrossed, and your feet flat on the floor. The cuff of the blood pressure monitor will be placed directly against the skin of your upper arm at the level of your heart. It should be measured at least twice using the same arm. Certain conditions can cause a difference in blood pressure between your right and left arms. Certain factors can cause blood pressure readings to be lower or higher than normal for a short period of time: When your blood pressure is higher when you are in a health care provider's office than when you are at home, this is called white coat hypertension. Most people with this condition do not need medicines. When your blood pressure is higher at home than when you are in a health care provider's office,  this is called masked hypertension. Most people with this condition may need medicines to control blood pressure. If you have a high blood pressure reading during one visit or you have normal blood pressure with other risk factors, you may be asked to: Return on a different day to have your blood pressure checked again. Monitor your blood pressure at home for 1 week or longer. If you are diagnosed with hypertension, you may have other blood or imaging tests to help your health care provider understand your overall risk for other conditions. How is this treated? This condition is treated by making healthy lifestyle changes, such as eating healthy foods, exercising more, and reducing your alcohol intake. Your health care provider may prescribe medicine if lifestyle changes are not enough to get your blood pressure under control, and if: Your systolic blood pressure is above 130. Your diastolic blood pressure is above 80. Your personal target blood pressure may vary depending on your medical conditions, your age, and other factors. Follow these instructions at home: Eating and drinking  Eat a diet that is high in fiber and potassium, and low in sodium, added sugar, and fat. An example eating plan is called the DASH (Dietary Approaches to Stop Hypertension) diet. To eat this way: Eat plenty of fresh fruits and vegetables. Try to fill one half of your plate at each meal with fruits and vegetables. Eat whole grains, such as whole-wheat pasta, brown rice, or whole-grain bread. Fill about one fourth of your plate with whole grains. Eat or drink low-fat dairy products, such as skim milk or low-fat yogurt. Avoid fatty cuts of meat, processed or cured meats, and poultry with skin. Fill about one fourth of your plate with lean proteins, such as fish, chicken without skin, beans, eggs, or tofu. Avoid pre-made and processed foods. These tend to be higher in sodium, added sugar, and fat. Reduce your daily sodium  intake. Most people with hypertension should eat  less than 1,500 mg of sodium a day. Do not drink alcohol if: Your health care provider tells you not to drink. You are pregnant, may be pregnant, or are planning to become pregnant. If you drink alcohol: Limit how much you use to: 0-1 drink a day for women. 0-2 drinks a day for men. Be aware of how much alcohol is in your drink. In the [place] one drink equals one 12 oz bottle of beer (355 mL), one 5 oz glass of wine (148 mL), or one 1 oz glass of hard liquor (44 mL). Lifestyle  Work with your health care provider to maintain a healthy body weight or to lose weight. Ask what an ideal weight is for you. Get at least 30 minutes of exercise most days of the week. Activities may include walking, swimming, or biking. Include exercise to strengthen your muscles (resistance exercise), such as Pilates or lifting weights, as part of your weekly exercise routine. Try to do these types of exercises for 30 minutes at least 3 days a week. Do not use any products that contain nicotine or tobacco, such as cigarettes, e-cigarettes, and chewing tobacco. If you need help quitting, ask your health care provider. Monitor your blood pressure at home as told by your health care provider. Keep all follow-up visits as told by your health care provider. This is important. Medicines Take over-the-counter and prescription medicines only as told by your health care provider. Follow directions carefully. Blood pressure medicines must be taken as prescribed. Do not skip doses of blood pressure medicine. Doing this puts you at risk for problems and can make the medicine less effective. Ask your health care provider about side effects or reactions to medicines that you should watch for. Contact a health care provider if you: Think you are having a reaction to a medicine you are taking. Have headaches that keep coming back (recurring). Feel dizzy. Have swelling in your  ankles. Have trouble with your vision. Get help right away if you: Develop a severe headache or confusion. Have unusual weakness or numbness. Feel faint. Have severe pain in your chest or abdomen. Vomit repeatedly. Have trouble breathing. Summary Hypertension is when the force of blood pumping through your arteries is too strong. If this condition is not controlled, it may put you at risk for serious complications. Your personal target blood pressure may vary depending on your medical conditions, your age, and other factors. For most people, a normal blood pressure is less than 120/80. Hypertension is treated with lifestyle changes, medicines, or a combination of both. Lifestyle changes include losing weight, eating a healthy, low-sodium diet, exercising more, and limiting alcohol. This information is not intended to replace advice given to you by your health care provider. Make sure you discuss any questions you have with your health care provider. Document Revised: 12/02/2017 Document Reviewed: 12/02/2017 Elsevier Patient Education  2022 [place], MD [facility]

## 2021-05-15 NOTE — Patient Instructions (Signed)

## 2021-06-17 ENCOUNTER — Other Ambulatory Visit (HOSPITAL_COMMUNITY): Payer: Self-pay

## 2021-08-14 ENCOUNTER — Encounter: Payer: Self-pay | Admitting: Emergency Medicine

## 2021-08-14 ENCOUNTER — Ambulatory Visit: Payer: Federal, State, Local not specified - PPO | Admitting: Emergency Medicine

## 2021-08-14 VITALS — BP 110/86 | HR 78 | Temp 98.0°F | Ht 67.0 in | Wt 203.4 lb

## 2021-08-14 DIAGNOSIS — I10 Essential (primary) hypertension: Secondary | ICD-10-CM | POA: Diagnosis not present

## 2021-08-14 DIAGNOSIS — E785 Hyperlipidemia, unspecified: Secondary | ICD-10-CM | POA: Diagnosis not present

## 2021-08-14 LAB — COMPREHENSIVE METABOLIC PANEL
ALT: 13 U/L (ref 0–35)
AST: 20 U/L (ref 0–37)
Albumin: 4.3 g/dL (ref 3.5–5.2)
Alkaline Phosphatase: 82 U/L (ref 39–117)
BUN: 16 mg/dL (ref 6–23)
CO2: 27 mEq/L (ref 19–32)
Calcium: 9.8 mg/dL (ref 8.4–10.5)
Chloride: 104 mEq/L (ref 96–112)
Creatinine, Ser: 0.95 mg/dL (ref 0.40–1.20)
GFR: 58.51 mL/min — ABNORMAL LOW (ref >60.00)
Glucose, Bld: 80 mg/dL (ref 70–99)
Potassium: 4.1 mEq/L (ref 3.5–5.1)
Sodium: 138 mEq/L (ref 135–145)
Total Bilirubin: 0.5 mg/dL (ref 0.2–1.2)
Total Protein: 7.8 g/dL (ref 6.0–8.3)

## 2021-08-14 LAB — LIPID PANEL
Cholesterol: 143 mg/dL (ref 0–200)
HDL: 63.6 mg/dL (ref >39.00)
LDL Cholesterol: 68 mg/dL (ref 0–99)
NonHDL: 79.35
Total CHOL/HDL Ratio: 2
Triglycerides: 59 mg/dL (ref 0.0–149.0)
VLDL: 11.8 mg/dL (ref 0.0–40.0)

## 2021-08-14 NOTE — Patient Instructions (Signed)
Health Maintenance After Age 76 After age 76, you are at a higher risk for certain long-term diseases and infections as well as injuries from falls. Falls are a major cause of broken bones and head injuries in people who are older than age 76. Getting regular preventive care can help to keep you healthy and well. Preventive care includes getting regular testing and making lifestyle changes as recommended by your health care provider. Talk with your health care provider about: Which screenings and tests you should have. A screening is a test that checks for a disease when you have no symptoms. A diet and exercise plan that is right for you. What should I know about screenings and tests to prevent falls? Screening and testing are the best ways to find a health problem early. Early diagnosis and treatment give you the best chance of managing medical conditions that are common after age 76. Certain conditions and lifestyle choices may make you more likely to have a fall. Your health care provider may recommend: Regular vision checks. Poor vision and conditions such as cataracts can make you more likely to have a fall. If you wear glasses, make sure to get your prescription updated if your vision changes. Medicine review. Work with your health care provider to regularly review all of the medicines you are taking, including over-the-counter medicines. Ask your health care provider about any side effects that may make you more likely to have a fall. Tell your health care provider if any medicines that you take make you feel dizzy or sleepy. Strength and balance checks. Your health care provider may recommend certain tests to check your strength and balance while standing, walking, or changing positions. Foot health exam. Foot pain and numbness, as well as not wearing proper footwear, can make you more likely to have a fall. Screenings, including: Osteoporosis screening. Osteoporosis is a condition that causes  the bones to get weaker and break more easily. Blood pressure screening. Blood pressure changes and medicines to control blood pressure can make you feel dizzy. Depression screening. You may be more likely to have a fall if you have a fear of falling, feel depressed, or feel unable to do activities that you used to do. Alcohol use screening. Using too much alcohol can affect your balance and may make you more likely to have a fall. Follow these instructions at home: Lifestyle Do not drink alcohol if: Your health care provider tells you not to drink. If you drink alcohol: Limit how much you have to: 0-1 drink a day for women. 0-2 drinks a day for men. Know how much alcohol is in your drink. In the [place] one drink equals one 12 oz bottle of beer (355 mL), one 5 oz glass of wine (148 mL), or one 1 oz glass of hard liquor (44 mL). Do not use any products that contain nicotine or tobacco. These products include cigarettes, chewing tobacco, and vaping devices, such as e-cigarettes. If you need help quitting, ask your health care provider. Activity  Follow a regular exercise program to stay fit. This will help you maintain your balance. Ask your health care provider what types of exercise are appropriate for you. If you need a cane or walker, use it as recommended by your health care provider. Wear supportive shoes that have nonskid soles. Safety  Remove any tripping hazards, such as rugs, cords, and clutter. Install safety equipment such as grab bars in bathrooms and safety rails on stairs. Keep rooms and walkways   well-lit. General instructions Talk with your health care provider about your risks for falling. Tell your health care provider if: You fall. Be sure to tell your health care provider about all falls, even ones that seem minor. You feel dizzy, tiredness (fatigue), or off-balance. Take over-the-counter and prescription medicines only as told by your health care provider. These include  supplements. Eat a healthy diet and maintain a healthy weight. A healthy diet includes low-fat dairy products, low-fat (lean) meats, and fiber from whole grains, beans, and lots of fruits and vegetables. Stay current with your vaccines. Schedule regular health, dental, and eye exams. Summary Having a healthy lifestyle and getting preventive care can help to protect your health and wellness after age 76. Screening and testing are the best way to find a health problem early and help you avoid having a fall. Early diagnosis and treatment give you the best chance for managing medical conditions that are more common for people who are older than age 76. Falls are a major cause of broken bones and head injuries in people who are older than age 76. Take precautions to prevent a fall at home. Work with your health care provider to learn what changes you can make to improve your health and wellness and to prevent falls. This information is not intended to replace advice given to you by your health care provider. Make sure you discuss any questions you have with your health care provider. Document Revised: 08/13/2020 Document Reviewed: 08/13/2020 Elsevier Patient Education  2023 [employer]  

## 2021-08-14 NOTE — Assessment & Plan Note (Signed)
Tolerating rosuvastatin well.  Diet and nutrition discussed. ?Cardiovascular risk associated with dyslipidemia discussed. ?Continue rosuvastatin 10 mg daily.  Lipid profile repeated today. ?

## 2021-08-14 NOTE — Assessment & Plan Note (Signed)
Well-controlled hypertension.  Continue amlodipine 10 mg daily. ?Has mild lower leg edema secondary to amlodipine. ?Dietary approaches to stop hypertension discussed. ?Cardiovascular risks associated with hypertension discussed. ?Follow-up in 6 months. ?

## 2021-08-14 NOTE — Progress Notes (Signed)
Danielle Kirby ?76 y.o. ? ? ?Chief Complaint  ?Patient presents with  ? Follow-up  ?  No concerns  ? ? ?HISTORY OF PRESENT ILLNESS: ?This is a 76 y.o. female with history of hypertension and dyslipidemia here for follow-up. ?Blood work done last January showed abnormal lipid profile with elevated cholesterol.  Was started on rosuvastatin 10 mg daily. ?No complaints or medical concerns today. ?BP Readings from Last 3 Encounters:  ?08/14/21 110/86  ?05/15/21 138/78  ?04/15/21 (!) 192/90  ? ? ? ?HPI ? ? ?Prior to Admission medications   ?Medication Sig Start Date End Date Taking? Authorizing Provider  ?amLODipine (NORVASC) 10 MG tablet Take 1 tablet (10 mg total) by mouth daily. 04/15/21  Yes Shanisha Lech, Eilleen Kempf, MD  ?Multiple Vitamin (MULTIVITAMIN WITH MINERALS) TABS tablet Take 1 tablet by mouth daily. 08/29/20  Yes Angiulli, Mcarthur Rossetti, PA-C  ?rosuvastatin (CRESTOR) 10 MG tablet Take 1 tablet (10 mg total) by mouth daily. 04/16/21  Yes Georgina Quint, MD  ? ? ?Allergies  ?Allergen Reactions  ? Penicillins   ?  REACTION: hives  ? ? ?Patient Active Problem List  ? Diagnosis Date Noted  ? Dyslipidemia 05/15/2021  ? Sleep disturbance   ? Post-operative pain   ? Benign essential HTN   ? Essential hypertension   ? 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 Degenerative arthritis of hip 05/23/2011  ? ? ?Past Medical History:  ?Diagnosis Date  ? Allergy   ? Arthritis   ? Cataract   ? Heart murmur   ? ? ?Past Surgical History:  ?Procedure Laterality Date  ? BREAST SURGERY    ? COSMETIC SURGERY    ? EYE SURGERY    ? TUBAL LIGATION    ? ? ?Social History  ? ?Socioeconomic History  ? Marital status: Significant Other  ?  Spouse name: Not on file  ? Number of children: Not on file  ? Years of education: Not on file  ? Highest education level: Not on file  ?Occupational  History  ? Not on file  ?Tobacco Use  ? Smoking status: Never  ? Smokeless tobacco: Never  ?Substance and Sexual Activity  ? Alcohol use: No  ?  Alcohol/week: 0.0 standard drinks  ? Drug use: No  ? Sexual activity: Yes  ?  Birth control/protection: None  ?Other Topics Concern  ? Not on file  ?Social History Narrative  ? Not on file  ? ?Social Determinants of Health  ? ?Financial Resource Strain: Not on file  ?Food Insecurity: Not on file  ?Transportation Needs: Not on file  ?Physical Activity: Not on file  ?Stress: Not on file  ?Social Connections: Not on file  ?Intimate Partner Violence: Not on file  ? ? ?Family History  ?Problem Relation Age of Onset  ? Diabetes Mother   ? Hypertension Father   ? ? ? ?Review of Systems  ?Constitutional: Negative.  Negative for chills and fever.  ?HENT: Negative.  Negative for congestion and sore throat.   ?Respiratory: Negative.  Negative for cough and shortness of breath.   ?Cardiovascular: Negative.  Negative for chest pain and palpitations.  ?Gastrointestinal:  Negative for abdominal pain, diarrhea, nausea and vomiting.  ?Genitourinary: Negative.  Negative for dysuria and hematuria.  ?Skin: Negative.  Negative for rash.  ?Neurological: Negative.  Negative for dizziness  and headaches.  ?All other systems reviewed and are negative. ? ?Today's Vitals  ? 08/14/21 1351  ?BP: 110/86  ?Pulse: 78  ?Temp: 98 ?F (36.7 ?C)  ?TempSrc: Oral  ?SpO2: 95%  ?Weight: 203 lb 6 oz (92.3 kg)  ?Height: 5\' 7"  (1.702 m)  ? ?Body mass index is 31.85 kg/m?. ? ?Physical Exam ?Vitals reviewed.  ?Constitutional:   ?   Appearance: Normal appearance.  ?HENT:  ?   Head: Normocephalic.  ?Eyes:  ?   Extraocular Movements: Extraocular movements intact.  ?   Conjunctiva/sclera: Conjunctivae normal.  ?   Pupils: Pupils are equal, round, and reactive to light.  ?Cardiovascular:  ?   Rate and Rhythm: Normal rate and regular rhythm.  ?   Pulses: Normal pulses.  ?   Heart sounds: Normal heart sounds.  ?Pulmonary:  ?    Effort: Pulmonary effort is normal.  ?   Breath sounds: Normal breath sounds.  ?Musculoskeletal:     ?   General: Normal range of motion.  ?   Cervical back: No tenderness.  ?   Comments: Mild lower extremity edema  ?Lymphadenopathy:  ?   Cervical: No cervical adenopathy.  ?Skin: ?   General: Skin is warm and dry.  ?   Capillary Refill: Capillary refill takes less than 2 seconds.  ?Neurological:  ?   General: No focal deficit present.  ?   Mental Status: She is alert and oriented to person, place, and time.  ?Psychiatric:     ?   Mood and Affect: Mood normal.     ?   Behavior: Behavior normal.  ? ? ? ?ASSESSMENT & PLAN: ?Problem List Items Addressed This Visit   ? ?  ? Cardiovascular and Mediastinum  ? Essential hypertension - Primary  ?  Well-controlled hypertension.  Continue amlodipine 10 mg daily. ?Has mild lower leg edema secondary to amlodipine. ?Dietary approaches to stop hypertension discussed. ?Cardiovascular risks associated with hypertension discussed. ?Follow-up in 6 months. ? ?  ?  ? Relevant Orders  ? Comprehensive metabolic panel  ? Lipid panel  ?  ? Other  ? Dyslipidemia  ?  Tolerating rosuvastatin well.  Diet and nutrition discussed. ?Cardiovascular risk associated with dyslipidemia discussed. ?Continue rosuvastatin 10 mg daily.  Lipid profile repeated today. ? ?  ?  ? Relevant Orders  ? Comprehensive metabolic panel  ? Lipid panel  ? ?Patient Instructions  ?Health Maintenance After Age 38 ?After age 28, you are at a higher risk for certain long-term diseases and infections as well as injuries from falls. Falls are a major cause of broken bones and head injuries in people who are older than age 35. Getting regular preventive care can help to keep you healthy and well. Preventive care includes getting regular testing and making lifestyle changes as recommended by your health care provider. Talk with your health care provider about: ?Which screenings and tests you should have. A screening is a test  that checks for a disease when you have no symptoms. ?A diet and exercise plan that is right for you. ?What should I know about screenings and tests to prevent falls? ?Screening and testing are the best ways to find a health problem early. Early diagnosis and treatment give you the best chance of managing medical conditions that are common after age 65. Certain conditions and lifestyle choices may make you more likely to have a fall. Your health care provider may recommend: ?Regular vision checks. Poor vision and conditions such  as cataracts can make you more likely to have a fall. If you wear glasses, make sure to get your prescription updated if your vision changes. ?Medicine review. Work with your health care provider to regularly review all of the medicines you are taking, including over-the-counter medicines. Ask your health care provider about any side effects that may make you more likely to have a fall. Tell your health care provider if any medicines that you take make you feel dizzy or sleepy. ?Strength and balance checks. Your health care provider may recommend certain tests to check your strength and balance while standing, walking, or changing positions. ?Foot health exam. Foot pain and numbness, as well as not wearing proper footwear, can make you more likely to have a fall. ?Screenings, including: ?Osteoporosis screening. Osteoporosis is a condition that causes the bones to get weaker and break more easily. ?Blood pressure screening. Blood pressure changes and medicines to control blood pressure can make you feel dizzy. ?Depression screening. You may be more likely to have a fall if you have a fear of falling, feel depressed, or feel unable to do activities that you used to do. ?Alcohol use screening. Using too much alcohol can affect your balance and may make you more likely to have a fall. ?Follow these instructions at home: ?Lifestyle ?Do not drink alcohol if: ?Your health care provider tells you  not to drink. ?If you drink alcohol: ?Limit how much you have to: ?0-1 drink a day for women. ?0-2 drinks a day for men. ?Know how much alcohol is in your drink. In the [place] one drink equals one 12 oz bottle

## 2021-10-14 ENCOUNTER — Emergency Department (HOSPITAL_COMMUNITY): Payer: Federal, State, Local not specified - PPO

## 2021-10-14 ENCOUNTER — Other Ambulatory Visit: Payer: Self-pay

## 2021-10-14 ENCOUNTER — Encounter (HOSPITAL_COMMUNITY): Payer: Self-pay

## 2021-10-14 ENCOUNTER — Observation Stay (HOSPITAL_COMMUNITY)
Admission: EM | Admit: 2021-10-14 | Discharge: 2021-10-15 | Disposition: A | Discharge status: Home / Self Care | Payer: Federal, State, Local not specified - PPO | Attending: Internal Medicine | Admitting: Internal Medicine

## 2021-10-14 DIAGNOSIS — A419 Sepsis, unspecified organism: Secondary | ICD-10-CM | POA: Insufficient documentation

## 2021-10-14 DIAGNOSIS — N179 Acute kidney failure, unspecified: Secondary | ICD-10-CM | POA: Diagnosis not present

## 2021-10-14 DIAGNOSIS — I10 Essential (primary) hypertension: Secondary | ICD-10-CM | POA: Diagnosis not present

## 2021-10-14 DIAGNOSIS — Z20822 Contact with and (suspected) exposure to covid-19: Secondary | ICD-10-CM | POA: Insufficient documentation

## 2021-10-14 DIAGNOSIS — R6510 Systemic inflammatory response syndrome (SIRS) of non-infectious origin without acute organ dysfunction: Secondary | ICD-10-CM | POA: Insufficient documentation

## 2021-10-14 DIAGNOSIS — N390 Urinary tract infection, site not specified: Secondary | ICD-10-CM | POA: Insufficient documentation

## 2021-10-14 DIAGNOSIS — Z79899 Other long term (current) drug therapy: Secondary | ICD-10-CM | POA: Diagnosis not present

## 2021-10-14 DIAGNOSIS — R112 Nausea with vomiting, unspecified: Secondary | ICD-10-CM | POA: Diagnosis present

## 2021-10-14 DIAGNOSIS — R319 Hematuria, unspecified: Secondary | ICD-10-CM

## 2021-10-14 LAB — URINALYSIS, ROUTINE W REFLEX MICROSCOPIC
Bilirubin Urine: NEGATIVE
Glucose, UA: NEGATIVE mg/dL
Ketones, ur: NEGATIVE mg/dL
Nitrite: NEGATIVE
Protein, ur: NEGATIVE mg/dL
Specific Gravity, Urine: 1.014 (ref 1.005–1.030)
pH: 5 (ref 5.0–8.0)

## 2021-10-14 LAB — COMPREHENSIVE METABOLIC PANEL
ALT: 15 U/L (ref 0–44)
AST: 28 U/L (ref 15–41)
Albumin: 3.9 g/dL (ref 3.5–5.0)
Alkaline Phosphatase: 77 U/L (ref 38–126)
Anion gap: 15 (ref 5–15)
BUN: 17 mg/dL (ref 8–23)
CO2: 19 mmol/L — ABNORMAL LOW (ref 22–32)
Calcium: 9.7 mg/dL (ref 8.9–10.3)
Chloride: 104 mmol/L (ref 98–111)
Creatinine, Ser: 1.5 mg/dL — ABNORMAL HIGH (ref 0.44–1.00)
GFR, Estimated: 36 mL/min — ABNORMAL LOW (ref >60)
Glucose, Bld: 98 mg/dL (ref 70–99)
Potassium: 4.3 mmol/L (ref 3.5–5.1)
Sodium: 138 mmol/L (ref 135–145)
Total Bilirubin: 0.8 mg/dL (ref 0.3–1.2)
Total Protein: 7.4 g/dL (ref 6.5–8.1)

## 2021-10-14 LAB — CBC
HCT: 39 % (ref 36.0–46.0)
Hemoglobin: 11.5 g/dL — ABNORMAL LOW (ref 12.0–15.0)
MCH: 27.7 pg (ref 26.0–34.0)
MCHC: 29.5 g/dL — ABNORMAL LOW (ref 30.0–36.0)
MCV: 94 fL (ref 80.0–100.0)
Platelets: 193 10*3/uL (ref 150–400)
RBC: 4.15 MIL/uL (ref 3.87–5.11)
RDW: 14.9 % (ref 11.5–15.5)
WBC: 9.7 10*3/uL (ref 4.0–10.5)
nRBC: 0 % (ref 0.0–0.2)

## 2021-10-14 LAB — PROTIME-INR
INR: 1.2 (ref 0.8–1.2)
Prothrombin Time: 14.7 seconds (ref 11.4–15.2)

## 2021-10-14 LAB — LACTIC ACID, PLASMA
Lactic Acid, Venous: 3.2 mmol/L (ref 0.5–1.9)
Lactic Acid, Venous: 1.7 mmol/L (ref 0.5–1.9)

## 2021-10-14 LAB — LIPASE, BLOOD: Lipase: 42 U/L (ref 11–51)

## 2021-10-14 LAB — APTT: aPTT: 24 seconds (ref 24–36)

## 2021-10-14 MED ORDER — LEVOFLOXACIN IN D5W 750 MG/150ML IV SOLN
750.0000 mg | Freq: Once | INTRAVENOUS | Status: AC
  Administered 2021-10-14: 750 mg via INTRAVENOUS
  Filled 2021-10-14: qty 150

## 2021-10-14 MED ORDER — SODIUM CHLORIDE 0.9 % IV BOLUS (SEPSIS)
1000.0000 mL | Freq: Once | INTRAVENOUS | Status: AC
  Administered 2021-10-14: 1000 mL via INTRAVENOUS

## 2021-10-14 MED ORDER — SODIUM CHLORIDE 0.9 % IV SOLN
2.0000 g | INTRAVENOUS | Status: DC
  Administered 2021-10-14: 2 g via INTRAVENOUS
  Filled 2021-10-14: qty 20

## 2021-10-14 MED ORDER — METRONIDAZOLE 500 MG/100ML IV SOLN
500.0000 mg | Freq: Once | INTRAVENOUS | Status: AC
  Administered 2021-10-14: 500 mg via INTRAVENOUS
  Filled 2021-10-14: qty 100

## 2021-10-14 MED ORDER — ACETAMINOPHEN 325 MG PO TABS
650.0000 mg | ORAL_TABLET | Freq: Once | ORAL | Status: AC | PRN
  Administered 2021-10-14: 650 mg via ORAL
  Filled 2021-10-14: qty 2

## 2021-10-14 MED ORDER — IOHEXOL 300 MG/ML  SOLN
80.0000 mL | Freq: Once | INTRAMUSCULAR | Status: AC | PRN
  Administered 2021-10-14: 80 mL via INTRAVENOUS

## 2021-10-14 MED ORDER — ONDANSETRON 4 MG PO TBDP
4.0000 mg | ORAL_TABLET | Freq: Once | ORAL | Status: AC | PRN
  Administered 2021-10-14: 4 mg via ORAL
  Filled 2021-10-14: qty 1

## 2021-10-14 NOTE — ED Provider Triage Note (Signed)
Emergency Medicine Provider Triage Evaluation Note  Danielle Kirby , a 76 y.o. female  was evaluated in triage.  Pt complains of Abdominal pain.  Her pain is in the middle of her upper abdomen.  She has been nauseous.  She has vomited multiple times.  She did have diarrhea today.  She denies any prior abdominal surgeries.  No urinary symptoms.   Physical Exam  BP 107/64   Pulse (!) 111   Temp (!) 103.3 F (39.6 C) (Oral)   Resp 16   SpO2 100%  Gen:   Awake, tachycardic Resp:  Normal effort  MSK:   Moves extremities without difficulty  Other:  Abdomen TTP over upper abdomen.   Medical Decision Making  Medically screening exam initiated at 3:44 PM.  Appropriate orders placed.  Danielle Kirby was informed that the remainder of the evaluation will be completed by another provider, this initial triage assessment does not replace that evaluation, and the importance of remaining in the ED until their evaluation is complete.  We will check labs, urine, CT abdomen pelvis.   Danielle Kirby, [place] 10/14/21 2211

## 2021-10-14 NOTE — ED Triage Notes (Signed)
Patient complains of abdominal cramping with vomiting and diarrhea x 1 day. Denies dysuria. Alert and oriented

## 2021-10-14 NOTE — Progress Notes (Signed)
Pharmacy Antibiotic Note  Danielle Kirby is a 76 y.o. female admitted on 10/14/2021 with  intra-abd infection .  Pharmacy has been consulted for Levaquin dosing. Pt with hives allergy to PCN. Low likelihood of cross sensitivitiy to cephalosporins - pt thinks she has taken Keflex in the past without issues. Will change to Ceftriaxone per protocol.  Plan: Rocephin 2gm IV q24h Pharmacy will sign off - please reconsult if needed     Temp (24hrs), Avg:102.5 F (39.2 C), Min:101.6 F (38.7 C), Max:103.3 F (39.6 C)  Recent Labs  Lab 10/14/21 1546 10/14/21 1557  WBC  --  9.7  CREATININE  --  1.50*  LATICACIDVEN 3.2*  --     CrCl cannot be calculated (Unknown ideal weight.).    Allergies  Allergen Reactions   Penicillins     REACTION: hives    Thank you for allowing pharmacy to be a part of this patient's care.  Christoper Fabian, PharmD, BCPS Please see amion for complete clinical pharmacist phone list 10/14/2021 6:56 PM

## 2021-10-14 NOTE — ED Notes (Signed)
Patient ambulates to the restroom unassisted at this time

## 2021-10-14 NOTE — ED Notes (Signed)
Lactic 3.2 - Dr. Jeraldine Loots informed via epic secure chat.

## 2021-10-14 NOTE — ED Provider Notes (Signed)
[facility] EMERGENCY DEPARTMENT Provider Note   CSN: [id number] Arrival date & time: 10/14/21  1452     History  Chief Complaint  Patient presents with   Emesis    Danielle Kirby is a 76 y.o. female.  Danielle Kirby is a 76 year old female with a past medical history of hypertension, traumatic brain injury presents with 2 to 3 days of abdominal cramps with nausea and vomiting.  Patient states she started vomiting 2 or 3 days ago after eating some watermelon.  She has been able to keep down solids and liquids intermittently over the past few days with her last meal being last night which she was able to keep down.  She notes some abdominal cramping intermittently but denies any during exam.  Denies any subjective fevers, chills, persistent abdominal pain, change in bowel or bladder habits, chest pain, shortness of breath, cough.  She has had some increased swelling in her bilateral lower extremities which she attributes to new medication of amlodipine which she is working with her PCP to change.        Home Medications Prior to Admission medications   Medication Sig Start Date End Date Taking? Authorizing Provider  amLODipine (NORVASC) 10 MG tablet Take 1 tablet (10 mg total) by mouth daily. 04/15/21  Yes Sagardia, Ines Bloomer, MD  latanoprost (XALATAN) 0.005 % ophthalmic solution Place 1 drop into both eyes at bedtime. 09/21/21  Yes [provider]  Multiple Vitamin (MULTIVITAMIN WITH MINERALS) TABS tablet Take 1 tablet by mouth daily. 08/29/20  Yes Angiulli, Lavon Paganini, PA-C  rosuvastatin (CRESTOR) 10 MG tablet Take 1 tablet (10 mg total) by mouth daily. 04/16/21  Yes Sagardia, Ines Bloomer, MD      Allergies    Penicillins    Review of Systems   Review of Systems  Constitutional:  Negative for chills.  HENT:  Negative for congestion, hearing loss and sore throat.   Eyes:  Negative for visual disturbance.  Respiratory:  Negative for cough and shortness of  breath.   Cardiovascular:  Positive for leg swelling. Negative for chest pain.  Gastrointestinal:  Positive for nausea and vomiting. Negative for abdominal pain, blood in stool, constipation and diarrhea.  Genitourinary:  Negative for difficulty urinating, dysuria and frequency.  Musculoskeletal:  Negative for neck pain.  Neurological:  Negative for syncope.    Physical Exam Updated Vital Signs BP 110/65   Pulse 83   Temp (!) 101.6 F (38.7 C) (Oral)   Resp (!) 22   SpO2 99%  Physical Exam Vitals reviewed.  Constitutional:      General: She is not in acute distress. Cardiovascular:     Rate and Rhythm: Normal rate and regular rhythm.  Pulmonary:     Effort: Pulmonary effort is normal.     Breath sounds: Rales present.  Abdominal:     General: There is no distension.     Palpations: Abdomen is soft.     Tenderness: There is no abdominal tenderness. There is no guarding.  Musculoskeletal:     Cervical back: No rigidity or tenderness.     Right lower leg: Edema present.     Left lower leg: Edema present.  Lymphadenopathy:     Head:     Right side of head: No submental, submandibular, preauricular or posterior auricular adenopathy.     Left side of head: No submental, submandibular, preauricular or posterior auricular adenopathy.     Cervical: No cervical adenopathy.  Upper Body:     Right upper body: No supraclavicular adenopathy.     Left upper body: No supraclavicular adenopathy.  Neurological:     Mental Status: She is alert and oriented to person, place, and time.     ED Results / Procedures / Treatments   Labs (all labs ordered are listed, but only abnormal results are displayed) Labs Reviewed  COMPREHENSIVE METABOLIC PANEL - Abnormal; Notable for the following components:      Result Value   CO2 19 (*)    Creatinine, Ser 1.50 (*)    GFR, Estimated 36 (*)    All other components within normal limits  CBC - Abnormal; Notable for the following components:    Hemoglobin 11.5 (*)    MCHC 29.5 (*)    All other components within normal limits  URINALYSIS, ROUTINE W REFLEX MICROSCOPIC - Abnormal; Notable for the following components:   Color, Urine AMBER (*)    APPearance HAZY (*)    Hgb urine dipstick SMALL (*)    Leukocytes,Ua TRACE (*)    Bacteria, UA RARE (*)    All other components within normal limits  LACTIC ACID, PLASMA - Abnormal; Notable for the following components:   Lactic Acid, Venous 3.2 (*)    All other components within normal limits  CULTURE, BLOOD (ROUTINE X 2)  CULTURE, BLOOD (ROUTINE X 2)  URINE CULTURE  SARS CORONAVIRUS 2 BY RT PCR  LIPASE, BLOOD  LACTIC ACID, PLASMA  PROTIME-INR  APTT    EKG None  Radiology CT ABDOMEN PELVIS W CONTRAST  Result Date: 10/14/2021 CLINICAL DATA:  Abdominal pain with nausea and vomiting. EXAM: CT ABDOMEN AND PELVIS WITH CONTRAST TECHNIQUE: Multidetector CT imaging of the abdomen and pelvis was performed using the standard protocol following bolus administration of intravenous contrast. RADIATION DOSE REDUCTION: This exam was performed according to the departmental dose-optimization program which includes automated exposure control, adjustment of the mA and/or kV according to patient size and/or use of iterative reconstruction technique. CONTRAST:  35mL OMNIPAQUE IOHEXOL 300 MG/ML  SOLN COMPARISON:  CT chest abdomen and pelvis 07/26/2020 FINDINGS: Lower chest: No acute abnormality. Hepatobiliary: No focal liver abnormality is seen. No gallstones, gallbladder wall thickening, or biliary dilatation. Calcified granulomas are seen in the liver. Pancreas: Unremarkable. No pancreatic ductal dilatation or surrounding inflammatory changes. Spleen: Normal in size without focal abnormality. Adrenals/Urinary Tract: Adrenal glands are unremarkable. Kidneys are normal, without renal calculi, focal lesion, or hydronephrosis. Bladder is unremarkable. Stomach/Bowel: Small hiatal hernia is present. Stomach is  otherwise within normal limits. Appendix appears normal. No evidence of bowel wall thickening, distention, or inflammatory changes. There is sigmoid colon diverticulosis. Vascular/Lymphatic: Aortic atherosclerosis. No enlarged abdominal or pelvic lymph nodes. Reproductive: There is likely an anterior fibroid calcifications measuring 2.2 cm. There may be some fluid within the endometrial canal. Adnexa are unremarkable. Other: No abdominal wall hernia or abnormality. No abdominopelvic ascites. Musculoskeletal: There are severe degenerative changes of the right hip similar to the prior study. IMPRESSION: 1. No acute localizing process in the abdomen or pelvis. 2. Small hiatal hernia. 3. Sigmoid colon diverticulosis. 4. There is questionable fluid in the endometrial canal. Anterior fibroid is present. A follow-up pelvic ultrasound is recommended when clinically appropriate. Electronically Signed   By: Ronney Asters M.D.   On: 10/14/2021 22:21   DG Chest 2 View  Result Date: 10/14/2021 CLINICAL DATA:  Nausea vomiting, possible sepsis EXAM: CHEST - 2 VIEW COMPARISON:  None Available. FINDINGS: The heart size and  mediastinal contours are within normal limits. Mild bibasilar opacities, likely due to atelectasis. Lungs otherwise clear. The visualized skeletal structures are unremarkable. IMPRESSION: Mild bibasilar opacities, likely due to atelectasis. Lungs otherwise clear. Electronically Signed   By: Yetta Glassman M.D.   On: 10/14/2021 16:29    Procedures Procedures    Medications Ordered in ED Medications  cefTRIAXone (ROCEPHIN) 2 g in sodium chloride 0.9 % 100 mL IVPB (0 g Intravenous Stopped 10/14/21 2134)  ondansetron (ZOFRAN-ODT) disintegrating tablet 4 mg (4 mg Oral Given 10/14/21 1546)  acetaminophen (TYLENOL) tablet 650 mg (650 mg Oral Given 10/14/21 1546)  levofloxacin (LEVAQUIN) IVPB 750 mg (750 mg Intravenous New Bag/Given 10/14/21 2134)  metroNIDAZOLE (FLAGYL) IVPB 500 mg (0 mg Intravenous Stopped  10/14/21 2134)  sodium chloride 0.9 % bolus 1,000 mL (0 mLs Intravenous Stopped 10/14/21 2134)  iohexol (OMNIPAQUE) 300 MG/ML solution 80 mL (80 mLs Intravenous Contrast Given 10/14/21 2204)    ED Course/ Medical Decision Making/ A&P                           Medical Decision Making Eshaal Duby is a 56-year-old female with a past medical history of traumatic brain injury and hypertension who presents with a few days of nausea and vomiting.  Patient was febrile at 103.3 on arrival and tachycardic.  Lactic acid of 3.2 and creatinine of 1.5 up from baseline of less than 1.  Patient met sepsis protocol and was worked up with CBC, CMP, lactic acid, lipase, coags, UA, blood/urine cultures, chest x-ray, and CT abdomen and pelvis.  Patient was started on empiric levofloxacin and metronidazole as well as reported with IV fluids.  She was given Tylenol and Zofran to treat her nausea and fever.  Patient was not in septic shock.  Repeat lactic acid was 1.7.  Urinalysis showed evidence of UTI, urine culture and blood culture pending.  CT abdomen pelvis showed no acute process or obvious site of infection.  No obvious source of infection at this point but there are also no concerning signs for meningitis, pyelonephritis or gastrointestinal infection.  Patient will be admitted for continued IV antibiotic therapy, observation, and further work-up.  Patient care transferred to oncoming ED physician.  Problems Addressed: Urinary tract infection with hematuria, site unspecified: acute illness or injury with systemic symptoms  Amount and/or Complexity of Data Reviewed Labs: ordered. Decision-making details documented in ED Course. Radiology: ordered and independent interpretation performed. Decision-making details documented in ED Course. ECG/medicine tests: ordered and independent interpretation performed. Decision-making details documented in ED Course.  Risk OTC drugs. Prescription drug  management.          Final Clinical Impression(s) / ED Diagnoses Final diagnoses:  Sepsis without acute organ dysfunction, due to unspecified organism [facility])  Urinary tract infection with hematuria, site unspecified    Rx / DC Orders ED Discharge Orders     None         Johny Blamer, DO 10/14/21 2324    Carmin Muskrat, MD 10/14/21 [phone]

## 2021-10-15 ENCOUNTER — Encounter (HOSPITAL_COMMUNITY): Payer: Self-pay | Admitting: Internal Medicine

## 2021-10-15 DIAGNOSIS — R197 Diarrhea, unspecified: Secondary | ICD-10-CM

## 2021-10-15 DIAGNOSIS — N179 Acute kidney failure, unspecified: Principal | ICD-10-CM | POA: Diagnosis present

## 2021-10-15 DIAGNOSIS — R6510 Systemic inflammatory response syndrome (SIRS) of non-infectious origin without acute organ dysfunction: Secondary | ICD-10-CM | POA: Diagnosis not present

## 2021-10-15 DIAGNOSIS — I10 Essential (primary) hypertension: Secondary | ICD-10-CM

## 2021-10-15 DIAGNOSIS — R112 Nausea with vomiting, unspecified: Secondary | ICD-10-CM | POA: Diagnosis not present

## 2021-10-15 LAB — BASIC METABOLIC PANEL
Anion gap: 6 (ref 5–15)
BUN: 14 mg/dL (ref 8–23)
CO2: 23 mmol/L (ref 22–32)
Calcium: 9.2 mg/dL (ref 8.9–10.3)
Chloride: 111 mmol/L (ref 98–111)
Creatinine, Ser: 0.95 mg/dL (ref 0.44–1.00)
GFR, Estimated: >60 mL/min (ref >60)
Glucose, Bld: 95 mg/dL (ref 70–99)
Potassium: 3.7 mmol/L (ref 3.5–5.1)
Sodium: 140 mmol/L (ref 135–145)

## 2021-10-15 LAB — SARS CORONAVIRUS 2 BY RT PCR: SARS Coronavirus 2 by RT PCR: NEGATIVE

## 2021-10-15 MED ORDER — METRONIDAZOLE 500 MG/100ML IV SOLN
500.0000 mg | Freq: Two times a day (BID) | INTRAVENOUS | Status: DC
  Administered 2021-10-15: 500 mg via INTRAVENOUS
  Filled 2021-10-15: qty 100

## 2021-10-15 MED ORDER — ONDANSETRON HCL 4 MG PO TABS: 4.0000 mg | ORAL_TABLET | Freq: Four times a day (QID) | ORAL | Status: DC | PRN

## 2021-10-15 MED ORDER — ACETAMINOPHEN 650 MG RE SUPP: 650.0000 mg | Freq: Four times a day (QID) | RECTAL | Status: DC | PRN

## 2021-10-15 MED ORDER — ENOXAPARIN SODIUM 40 MG/0.4ML IJ SOSY: 40.0000 mg | PREFILLED_SYRINGE | INTRAMUSCULAR | Status: DC

## 2021-10-15 MED ORDER — ACETAMINOPHEN 325 MG PO TABS: 650.0000 mg | ORAL_TABLET | Freq: Four times a day (QID) | ORAL | Status: DC | PRN

## 2021-10-15 MED ORDER — LACTATED RINGERS IV SOLN: INTRAVENOUS | Status: DC

## 2021-10-15 MED ORDER — ONDANSETRON HCL 4 MG/2ML IJ SOLN: 4.0000 mg | Freq: Four times a day (QID) | INTRAMUSCULAR | Status: DC | PRN

## 2021-10-15 NOTE — Assessment & Plan Note (Signed)
Likely pre-renal in setting of N/V/D. 1. IVF as above 2. No obstruction on CT AP 3. Strict intake and output 4. Repeat BMP in AM

## 2021-10-15 NOTE — Assessment & Plan Note (Signed)
Presumed infectious GI illness No acute abnormality on CT AP 1. zofran PRN nausea 2. Clear liquid diet 3. GI pathogen pnl 4. IVF

## 2021-10-15 NOTE — Assessment & Plan Note (Signed)
Hold home norvasc in setting of SIRS

## 2021-10-15 NOTE — H&P (Signed)
History and Physical    Patient: Danielle Kirby QIW:[id number] DOB: 20-Sep-1945 DOA: 10/14/2021 DOS: the patient was seen and examined on 10/15/2021 PCP: Georgina Quint, MD  Patient coming from: Home  Chief Complaint:  Chief Complaint  Patient presents with   Emesis   HPI: Danielle Kirby is a 76 y.o. female with medical history significant of HTN, TBI.  Mostly healthy though.  Pt presents to ED with c/o N/V/D.  Onset 2-3 days ago after eating some watermelon.  Able to keep down solids and liquids intermittently.  Abd cramping intermittently, none right now.  No subjective fever, chills, persistent abd pain.  In ED: has objective fever of 103.x, tachy to 111   Review of Systems: As mentioned in the history of present illness. All other systems reviewed and are negative. Past Medical History:  Diagnosis Date   Allergy    Arthritis    Cataract    Heart murmur    Past Surgical History:  Procedure Laterality Date   BREAST SURGERY     COSMETIC SURGERY     EYE SURGERY     TUBAL LIGATION     Social History:  reports that she has never smoked. She has never used smokeless tobacco. She reports that she does not drink alcohol and does not use drugs.  Allergies  Allergen Reactions   Penicillins     REACTION: hives    Family History  Problem Relation Age of Onset   Diabetes Mother    Hypertension Father     Prior to Admission medications   Medication Sig Start Date End Date Taking? Authorizing Provider  amLODipine (NORVASC) 10 MG tablet Take 1 tablet (10 mg total) by mouth daily. 04/15/21  Yes Sagardia, Eilleen Kempf, MD  latanoprost (XALATAN) 0.005 % ophthalmic solution Place 1 drop into both eyes at bedtime. 09/21/21  Yes [provider]  Multiple Vitamin (MULTIVITAMIN WITH MINERALS) TABS tablet Take 1 tablet by mouth daily. 08/29/20  Yes Angiulli, Mcarthur Rossetti, PA-C  rosuvastatin (CRESTOR) 10 MG tablet Take 1 tablet (10 mg total) by mouth daily. 04/16/21  Yes  Georgina Quint, MD    Physical Exam: Vitals:   10/14/21 2030 10/14/21 2130 10/14/21 2135 10/14/21 2328  BP: 100/77 110/65    Pulse: 81 83 78   Resp: 19 (!) 22 15   Temp:    98.9 F (37.2 C)  TempSrc:    Oral  SpO2: 100% 99% 100%    Constitutional: NAD, calm, comfortable Eyes: PERRL, lids and conjunctivae normal ENMT: Mucous membranes are moist. Posterior pharynx clear of any exudate or lesions.Normal dentition.  Neck: normal, supple, no masses, no thyromegaly Respiratory: clear to auscultation bilaterally, no wheezing, no crackles. Normal respiratory effort. No accessory muscle use.  Cardiovascular: Regular rate and rhythm (tachycardia resolved by time of my exam), no murmurs / rubs / gallops. Trace extremity edema. 2+ pedal pulses. No carotid bruits.  Abdomen: no tenderness, no masses palpated. No hepatosplenomegaly. Bowel sounds positive.  Musculoskeletal: no clubbing / cyanosis. No joint deformity upper and lower extremities. Good ROM, no contractures. Normal muscle tone.  Skin: no rashes, lesions, ulcers. No induration Neurologic: CN 2-12 grossly intact. Sensation intact, DTR normal. Strength 5/5 in all 4.  Psychiatric: Normal judgment and insight. Alert and oriented x 3. Normal mood.   Data Reviewed:       Latest Ref Rng & Units 10/14/2021    3:57 PM 04/15/2021    4:23 PM 08/24/2020   12:25 PM  CBC  WBC 4.0 - 10.5 K/uL 9.7  4.4  4.2   Hemoglobin 12.0 - 15.0 g/dL 82.4  23.5  36.1   Hematocrit 36.0 - 46.0 % 39.0  38.6  36.4   Platelets 150 - 400 K/uL 193  201.0  199    CMP     Component Value Date/Time   NA 138 10/14/2021 1557   NA 144 03/01/2019 1004   K 4.3 10/14/2021 1557   CL 104 10/14/2021 1557   CO2 19 (L) 10/14/2021 1557   GLUCOSE 98 10/14/2021 1557   BUN 17 10/14/2021 1557   BUN 10 03/01/2019 1004   CREATININE 1.50 (H) 10/14/2021 1557   CREATININE 0.69 07/26/2015 1411   CALCIUM 9.7 10/14/2021 1557   PROT 7.4 10/14/2021 1557   PROT 7.4 03/01/2019  1004   ALBUMIN 3.9 10/14/2021 1557   ALBUMIN 4.3 03/01/2019 1004   AST 28 10/14/2021 1557   ALT 15 10/14/2021 1557   ALKPHOS 77 10/14/2021 1557   BILITOT 0.8 10/14/2021 1557   BILITOT 0.3 03/01/2019 1004   GFRNONAA 36 (L) 10/14/2021 1557   GFRNONAA 89 07/26/2015 1411   GFRAA 89 03/01/2019 1004   GFRAA >89 07/26/2015 1411     Assessment and Plan: * SIRS (systemic inflammatory response syndrome) (HCC) Fever and tachycardia due to acute GI illness, presumed infectious. Empiric rocephin + flagyl GI pathogen pnl ordered Tylenol PRN fever IVF: 1L bolus then LR at 125 COVID negative  AKI (acute kidney injury) (HCC) Likely pre-renal in setting of N/V/D. IVF as above No obstruction on CT AP Strict intake and output Repeat BMP in AM  Nausea vomiting and diarrhea Presumed infectious GI illness No acute abnormality on CT AP zofran PRN nausea Clear liquid diet GI pathogen pnl IVF  Essential hypertension Hold home norvasc in setting of SIRS      Advance Care Planning:   Code Status: Full Code  Consults: None  Family Communication: No family in room  Severity of Illness: The appropriate patient status for this patient is OBSERVATION. Observation status is judged to be reasonable and necessary in order to provide the required intensity of service to ensure the patient's safety. The patient's presenting symptoms, physical exam findings, and initial radiographic and laboratory data in the context of their medical condition is felt to place them at decreased risk for further clinical deterioration. Furthermore, it is anticipated that the patient will be medically stable for discharge from the hospital within 2 midnights of admission.   Author: Hillary Bow., DO 10/15/2021 12:58 AM  For on call review [email or web address].

## 2021-10-15 NOTE — ED Notes (Signed)
Patient notified of the need for a stool sample

## 2021-10-15 NOTE — Discharge Summary (Signed)
Physician Discharge Summary   Patient: Danielle Kirby MRN: [id number] DOB: 12/16/1945  Admit date:     10/14/2021  Discharge date: 10/15/21  Discharge Physician: Dorcas Carrow   PCP: Georgina Quint, MD   Recommendations at discharge:   Follow-up at PCP office in 1 to 2 weeks.  Discharge Diagnoses: Principal Problem:   SIRS (systemic inflammatory response syndrome) (HCC) Active Problems:   Nausea vomiting and diarrhea   AKI (acute kidney injury) (HCC)   Essential hypertension  Resolved Problems:   * No resolved hospital problems. *  Hospital Course:  76 year old with history of hypertension TBI who was not eating well and had nausea for last 2 to 3 days had intermittent abdominal pain and cramping came to the hospital due to symptoms not relieved in 2 to 3 days at home.  In the emergency room, hemodynamically stable.  Temperature 103.  Found to have elevated creatinine of 1.5.  CT scan abdomen pelvis was normal.  Admitted for symptomatic treatment.  Treated with IV fluids, started on empiric Rocephin and Flagyl.  All symptoms improved, patient able to eat regular diet and mobilizing around emergency room.  No bowel movement since admission, patient denied any history of diarrhea.  No localizing source of infection.  Blood cultures negative so far.  Afebrile overnight.  Likely viral gastroenteritis with predominant nausea symptoms.  Since patient is asymptomatic today, renal functions have normalized and patient is able to maintain hydration and nutrition and desires to go home.  She is discharged home.  No change in medications were done.  She was advised to keep up hydration and monitor for any recurrent symptoms.       Consultants: None Procedures performed: None Disposition: Home Diet recommendation:  Discharge Diet Orders (From admission, onward)     Start     Ordered   10/15/21 0000  Diet - low sodium heart healthy        10/15/21 0825           Cardiac  diet DISCHARGE MEDICATION: Allergies as of 10/15/2021       Reactions   Penicillins    REACTION: hives        Medication List     TAKE these medications    amLODipine 10 MG tablet Commonly known as: NORVASC Take 1 tablet (10 mg total) by mouth daily.   latanoprost 0.005 % ophthalmic solution Commonly known as: XALATAN Place 1 drop into both eyes at bedtime.   multivitamin with minerals Tabs tablet Take 1 tablet by mouth daily.   rosuvastatin 10 MG tablet Commonly known as: Crestor Take 1 tablet (10 mg total) by mouth daily.        Discharge Exam: There were no vitals filed for this visit. Mucous membranes are moist.  Comfortable.  Walking around in the hallway. Eager to go home.  Condition at discharge: good  The results of significant diagnostics from this hospitalization (including imaging, microbiology, ancillary and laboratory) are listed below for reference.   Imaging Studies: CT ABDOMEN PELVIS W CONTRAST  Result Date: 10/14/2021 CLINICAL DATA:  Abdominal pain with nausea and vomiting. EXAM: CT ABDOMEN AND PELVIS WITH CONTRAST TECHNIQUE: Multidetector CT imaging of the abdomen and pelvis was performed using the standard protocol following bolus administration of intravenous contrast. RADIATION DOSE REDUCTION: This exam was performed according to the departmental dose-optimization program which includes automated exposure control, adjustment of the mA and/or kV according to patient size and/or use of iterative reconstruction technique. CONTRAST:  38mL OMNIPAQUE IOHEXOL 300 MG/ML  SOLN COMPARISON:  CT chest abdomen and pelvis 07/26/2020 FINDINGS: Lower chest: No acute abnormality. Hepatobiliary: No focal liver abnormality is seen. No gallstones, gallbladder wall thickening, or biliary dilatation. Calcified granulomas are seen in the liver. Pancreas: Unremarkable. No pancreatic ductal dilatation or surrounding inflammatory changes. Spleen: Normal in size without focal  abnormality. Adrenals/Urinary Tract: Adrenal glands are unremarkable. Kidneys are normal, without renal calculi, focal lesion, or hydronephrosis. Bladder is unremarkable. Stomach/Bowel: Small hiatal hernia is present. Stomach is otherwise within normal limits. Appendix appears normal. No evidence of bowel wall thickening, distention, or inflammatory changes. There is sigmoid colon diverticulosis. Vascular/Lymphatic: Aortic atherosclerosis. No enlarged abdominal or pelvic lymph nodes. Reproductive: There is likely an anterior fibroid calcifications measuring 2.2 cm. There may be some fluid within the endometrial canal. Adnexa are unremarkable. Other: No abdominal wall hernia or abnormality. No abdominopelvic ascites. Musculoskeletal: There are severe degenerative changes of the right hip similar to the prior study. IMPRESSION: 1. No acute localizing process in the abdomen or pelvis. 2. Small hiatal hernia. 3. Sigmoid colon diverticulosis. 4. There is questionable fluid in the endometrial canal. Anterior fibroid is present. A follow-up pelvic ultrasound is recommended when clinically appropriate. Electronically Signed   By: Darliss Cheney M.D.   On: 10/14/2021 22:21   DG Chest 2 View  Result Date: 10/14/2021 CLINICAL DATA:  Nausea vomiting, possible sepsis EXAM: CHEST - 2 VIEW COMPARISON:  None Available. FINDINGS: The heart size and mediastinal contours are within normal limits. Mild bibasilar opacities, likely due to atelectasis. Lungs otherwise clear. The visualized skeletal structures are unremarkable. IMPRESSION: Mild bibasilar opacities, likely due to atelectasis. Lungs otherwise clear. Electronically Signed   By: Allegra Lai M.D.   On: 10/14/2021 16:29    Microbiology: Results for orders placed or performed during the hospital encounter of 10/14/21  Blood Culture (routine x 2)     Status: None (Preliminary result)   Collection Time: 10/14/21  8:05 PM   Specimen: BLOOD  Result Value Ref Range  Status   Specimen Description BLOOD BLOOD RIGHT WRIST  Final   Special Requests   Final    BOTTLES DRAWN AEROBIC AND ANAEROBIC Blood Culture results may not be optimal due to an inadequate volume of blood received in culture bottles   Culture   Final    NO GROWTH < 12 HOURS Performed at [facility], [address] [place], [place] [address]    Report Status PENDING  Incomplete  SARS Coronavirus 2 by RT PCR (hospital order, performed in [facility] hospital lab) *cepheid single result test* Anterior Nasal Swab     Status: None   Collection Time: 10/14/21 11:30 PM   Specimen: Anterior Nasal Swab  Result Value Ref Range Status   SARS Coronavirus 2 by RT PCR NEGATIVE NEGATIVE Final    Comment: (NOTE) SARS-CoV-2 target nucleic acids are NOT DETECTED.  The SARS-CoV-2 RNA is generally detectable in upper and lower respiratory specimens during the acute phase of infection. The lowest concentration of SARS-CoV-2 viral copies this assay can detect is 250 copies / mL. A negative result does not preclude SARS-CoV-2 infection and should not be used as the sole basis for treatment or other patient management decisions.  A negative result may occur with improper specimen collection / handling, submission of specimen other than nasopharyngeal swab, presence of viral mutation(s) within the areas targeted by this assay, and inadequate number of viral copies (<250 copies / mL). A negative result  must be combined with clinical observations, patient history, and epidemiological information.  Fact Sheet for Patients:   RoadLapTop.co.za  Fact Sheet for Healthcare Providers: http://kim-miller.com/  This test is not yet approved or  cleared by the [place] FDA and has been authorized for detection and/or diagnosis of SARS-CoV-2 by FDA under an Emergency Use Authorization (EUA).  This EUA will remain in effect (meaning this test can be used) for  the duration of the COVID-19 declaration under Section 564(b)(1) of the Act, 21 U.S.C. section 360bbb-3(b)(1), unless the authorization is terminated or revoked sooner.  Performed at [facility], [address], [place], [place] [address]     Labs: CBC: Recent Labs  Lab 10/14/21 1557  WBC 9.7  HGB 11.5*  HCT 39.0  MCV 94.0  PLT 193   Basic Metabolic Panel: Recent Labs  Lab 10/14/21 1557 10/15/21 0724  NA 138 140  K 4.3 3.7  CL 104 111  CO2 19* 23  GLUCOSE 98 95  BUN 17 14  CREATININE 1.50* 0.95  CALCIUM 9.7 9.2   Liver Function Tests: Recent Labs  Lab 10/14/21 1557  AST 28  ALT 15  ALKPHOS 77  BILITOT 0.8  PROT 7.4  ALBUMIN 3.9   CBG: No results for input(s): "GLUCAP" in the last 168 hours.  Discharge time spent: less than 30 minutes.  Signed: Dorcas Carrow, MD [facility] 10/15/2021

## 2021-10-15 NOTE — Care Management Obs Status (Signed)
MEDICARE OBSERVATION STATUS NOTIFICATION   Patient Details  Name: Danielle Kirby MRN: [id number] Date of Birth: [birth year]   Medicare Observation Status Notification Given:  Yes    Oletta Cohn, RN 10/15/2021, 10:41 AM

## 2021-10-15 NOTE — Assessment & Plan Note (Signed)
Fever and tachycardia due to acute GI illness, presumed infectious. 1. Empiric rocephin + flagyl 2. GI pathogen pnl ordered 3. Tylenol PRN fever 4. IVF: 1L bolus then LR at 125 5. COVID negative

## 2021-10-16 LAB — URINE CULTURE

## 2021-10-19 LAB — CULTURE, BLOOD (ROUTINE X 2)
Culture: NO GROWTH
Culture: NO GROWTH
Special Requests: ADEQUATE

## 2021-10-21 ENCOUNTER — Ambulatory Visit: Payer: Federal, State, Local not specified - PPO | Admitting: Emergency Medicine

## 2021-10-21 ENCOUNTER — Encounter: Payer: Self-pay | Admitting: Emergency Medicine

## 2021-10-21 VITALS — BP 120/66 | HR 83 | Temp 98.4°F | Ht 67.0 in | Wt 204.4 lb

## 2021-10-21 DIAGNOSIS — E785 Hyperlipidemia, unspecified: Secondary | ICD-10-CM | POA: Diagnosis not present

## 2021-10-21 DIAGNOSIS — I10 Essential (primary) hypertension: Secondary | ICD-10-CM | POA: Diagnosis not present

## 2021-10-21 DIAGNOSIS — Z1211 Encounter for screening for malignant neoplasm of colon: Secondary | ICD-10-CM

## 2021-10-21 DIAGNOSIS — R600 Localized edema: Secondary | ICD-10-CM | POA: Diagnosis not present

## 2021-10-21 DIAGNOSIS — Z09 Encounter for follow-up examination after completed treatment for conditions other than malignant neoplasm: Secondary | ICD-10-CM | POA: Diagnosis not present

## 2021-10-21 MED ORDER — VALSARTAN-HYDROCHLOROTHIAZIDE 160-12.5 MG PO TABS: 1.0000 | ORAL_TABLET | Freq: Every day | ORAL | 3 refills | Status: DC

## 2021-10-21 NOTE — Assessment & Plan Note (Signed)
Well-controlled hypertension. However amlodipine creating undesirable side effects affecting quality of life. Stop amlodipine and start valsartan-HCTZ 160-12.5 mg daily. Dietary approaches to stop hypertension discussed. Advised to monitor blood pressure readings several times a day for the next  couple weeks and keep a log.  Advised to contact the office if numbers persistently high. Follow-up in 3 months.

## 2021-10-21 NOTE — Patient Instructions (Signed)
Stop amlodipine. Start valsartan-HCTZ 160-12.5 mg daily. Monitor blood pressure reading at home daily for the next several weeks and keep a log. Contact the office if numbers persistently high.  Hypertension, Adult High blood pressure (hypertension) is when the force of blood pumping through the arteries is too strong. The arteries are the blood vessels that carry blood from the heart throughout the body. Hypertension forces the heart to work harder to pump blood and may cause arteries to become narrow or stiff. Untreated or uncontrolled hypertension can lead to a heart attack, heart failure, a stroke, kidney disease, and other problems. A blood pressure reading consists of a higher number over a lower number. Ideally, your blood pressure should be below 120/80. The first ("top") number is called the systolic pressure. It is a measure of the pressure in your arteries as your heart beats. The second ("bottom") number is called the diastolic pressure. It is a measure of the pressure in your arteries as the heart relaxes. What are the causes? The exact cause of this condition is not known. There are some conditions that result in high blood pressure. What increases the risk? Certain factors may make you more likely to develop high blood pressure. Some of these risk factors are under your control, including: Smoking. Not getting enough exercise or physical activity. Being overweight. Having too much fat, sugar, calories, or salt (sodium) in your diet. Drinking too much alcohol. Other risk factors include: Having a personal history of heart disease, diabetes, high cholesterol, or kidney disease. Stress. Having a family history of high blood pressure and high cholesterol. Having obstructive sleep apnea. Age. The risk increases with age. What are the signs or symptoms? High blood pressure may not cause symptoms. Very high blood pressure (hypertensive crisis) may cause: Headache. Fast or irregular  heartbeats (palpitations). Shortness of breath. Nosebleed. Nausea and vomiting. Vision changes. Severe chest pain, dizziness, and seizures. How is this diagnosed? This condition is diagnosed by measuring your blood pressure while you are seated, with your arm resting on a flat surface, your legs uncrossed, and your feet flat on the floor. The cuff of the blood pressure monitor will be placed directly against the skin of your upper arm at the level of your heart. Blood pressure should be measured at least twice using the same arm. Certain conditions can cause a difference in blood pressure between your right and left arms. If you have a high blood pressure reading during one visit or you have normal blood pressure with other risk factors, you may be asked to: Return on a different day to have your blood pressure checked again. Monitor your blood pressure at home for 1 week or longer. If you are diagnosed with hypertension, you may have other blood or imaging tests to help your health care provider understand your overall risk for other conditions. How is this treated? This condition is treated by making healthy lifestyle changes, such as eating healthy foods, exercising more, and reducing your alcohol intake. You may be referred for counseling on a healthy diet and physical activity. Your health care provider may prescribe medicine if lifestyle changes are not enough to get your blood pressure under control and if: Your systolic blood pressure is above 130. Your diastolic blood pressure is above 80. Your personal target blood pressure may vary depending on your medical conditions, your age, and other factors. Follow these instructions at home: Eating and drinking  Eat a diet that is high in fiber and potassium, and  low in sodium, added sugar, and fat. An example of this eating plan is called the DASH diet. DASH stands for Dietary Approaches to Stop Hypertension. To eat this way: Eat plenty of  fresh fruits and vegetables. Try to fill one half of your plate at each meal with fruits and vegetables. Eat whole grains, such as whole-wheat pasta, brown rice, or whole-grain bread. Fill about one fourth of your plate with whole grains. Eat or drink low-fat dairy products, such as skim milk or low-fat yogurt. Avoid fatty cuts of meat, processed or cured meats, and poultry with skin. Fill about one fourth of your plate with lean proteins, such as fish, chicken without skin, beans, eggs, or tofu. Avoid pre-made and processed foods. These tend to be higher in sodium, added sugar, and fat. Reduce your daily sodium intake. Many people with hypertension should eat less than 1,500 mg of sodium a day. Do not drink alcohol if: Your health care provider tells you not to drink. You are pregnant, may be pregnant, or are planning to become pregnant. If you drink alcohol: Limit how much you have to: 0-1 drink a day for women. 0-2 drinks a day for men. Know how much alcohol is in your drink. In the [place] one drink equals one 12 oz bottle of beer (355 mL), one 5 oz glass of wine (148 mL), or one 1 oz glass of hard liquor (44 mL). Lifestyle  Work with your health care provider to maintain a healthy body weight or to lose weight. Ask what an ideal weight is for you. Get at least 30 minutes of exercise that causes your heart to beat faster (aerobic exercise) most days of the week. Activities may include walking, swimming, or biking. Include exercise to strengthen your muscles (resistance exercise), such as Pilates or lifting weights, as part of your weekly exercise routine. Try to do these types of exercises for 30 minutes at least 3 days a week. Do not use any products that contain nicotine or tobacco. These products include cigarettes, chewing tobacco, and vaping devices, such as e-cigarettes. If you need help quitting, ask your health care provider. Monitor your blood pressure at home as told by your health  care provider. Keep all follow-up visits. This is important. Medicines Take over-the-counter and prescription medicines only as told by your health care provider. Follow directions carefully. Blood pressure medicines must be taken as prescribed. Do not skip doses of blood pressure medicine. Doing this puts you at risk for problems and can make the medicine less effective. Ask your health care provider about side effects or reactions to medicines that you should watch for. Contact a health care provider if you: Think you are having a reaction to a medicine you are taking. Have headaches that keep coming back (recurring). Feel dizzy. Have swelling in your ankles. Have trouble with your vision. Get help right away if you: Develop a severe headache or confusion. Have unusual weakness or numbness. Feel faint. Have severe pain in your chest or abdomen. Vomit repeatedly. Have trouble breathing. These symptoms may be an emergency. Get help right away. Call 911. Do not wait to see if the symptoms will go away. Do not drive yourself to the hospital. Summary Hypertension is when the force of blood pumping through your arteries is too strong. If this condition is not controlled, it may put you at risk for serious complications. Your personal target blood pressure may vary depending on your medical conditions, your age, and other factors.  For most people, a normal blood pressure is less than 120/80. Hypertension is treated with lifestyle changes, medicines, or a combination of both. Lifestyle changes include losing weight, eating a healthy, low-sodium diet, exercising more, and limiting alcohol. This information is not intended to replace advice given to you by your health care provider. Make sure you discuss any questions you have with your health care provider. Document Revised: 01/29/2021 Document Reviewed: 01/29/2021 Elsevier Patient Education  [place].

## 2021-10-21 NOTE — Progress Notes (Signed)
Mina Babula Cullin 76 y.o.   Chief Complaint  Patient presents with   Hospitalization Follow-up    Pt wants her cholesterol medication to be changed due to edema in her legs     HISTORY OF PRESENT ILLNESS: This is a 76 y.o. female here for follow-up of a brief hospital stay last week when she presented with nausea and vomiting.  Negative sepsis work-up.  Unremarkable CT scan of abdomen and pelvis.  Unremarkable blood work. Doing well.  Only complaining of swelling of both ankles and feet. Patient has history of hypertension, on amlodipine. No other complaints or medical concerns.  HPI   Prior to Admission medications   Medication Sig Start Date End Date Taking? Authorizing Provider  latanoprost (XALATAN) 0.005 % ophthalmic solution Place 1 drop into both eyes at bedtime. 09/21/21  Yes [provider]  Multiple Vitamin (MULTIVITAMIN WITH MINERALS) TABS tablet Take 1 tablet by mouth daily. 08/29/20  Yes Angiulli, Mcarthur Rossetti, PA-C  rosuvastatin (CRESTOR) 10 MG tablet Take 1 tablet (10 mg total) by mouth daily. 04/16/21  Yes Jamyiah Labella, Eilleen Kempf, MD  valsartan-hydrochlorothiazide (DIOVAN-HCT) 160-12.5 MG tablet Take 1 tablet by mouth daily. 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   Degenerative arthritis of hip 05/23/2011    Past Medical History:  Diagnosis Date   Allergy    Arthritis     Cataract    Heart murmur     Past Surgical History:  Procedure Laterality Date   BREAST SURGERY     COSMETIC SURGERY     EYE SURGERY     TUBAL LIGATION      Social History   Socioeconomic History   Marital status: Significant Other    Spouse name: Not on file   Number of children: Not on file   Years of education: Not on file   Highest education level: Not on file  Occupational History   Not on file  Tobacco Use   Smoking status: Never   Smokeless tobacco: Never  Substance and Sexual Activity   Alcohol use: No    Alcohol/week: 0.0 standard drinks of alcohol   Drug use: No   Sexual activity: Yes    Birth control/protection: None  Other Topics Concern   Not on file  Social History Narrative   Not on file   Social Determinants of Health   Financial Resource Strain: Not on file  Food Insecurity: Not on file  Transportation Needs: Not on file  Physical Activity: Not on file  Stress: Not on file  Social Connections: Not on file  Intimate Partner Violence: Not on file    Family History  Problem Relation Age of Onset   Diabetes Mother    Hypertension Father      Review of Systems  Constitutional: Negative.  Negative for chills and fever.  HENT: Negative.  Negative  for congestion and sore throat.   Respiratory: Negative.  Negative for cough and shortness of breath.   Cardiovascular:  Positive for leg swelling. Negative for chest pain and palpitations.  Gastrointestinal:  Negative for abdominal pain, diarrhea, nausea and vomiting.  Genitourinary: Negative.  Negative for dysuria and hematuria.  Skin: Negative.  Negative for rash.  Neurological: Negative.  Negative for dizziness and headaches.  All other systems reviewed and are negative.  Today's Vitals   10/21/21 1422  BP: 120/66  Pulse: 83  Temp: 98.4 F (36.9 C)  TempSrc: Oral  SpO2: 99%  Weight: 204 lb 6 oz (92.7 kg)  Height: 5\' 7"  (1.702 m)   Body mass index is 32.01 kg/m.   Physical  Exam Vitals reviewed.  Constitutional:      Appearance: Normal appearance.  HENT:     Head: Normocephalic.  Eyes:     Extraocular Movements: Extraocular movements intact.     Conjunctiva/sclera: Conjunctivae normal.     Pupils: Pupils are equal, round, and reactive to light.  Cardiovascular:     Rate and Rhythm: Normal rate and regular rhythm.     Pulses: Normal pulses.     Heart sounds: Normal heart sounds.  Pulmonary:     Effort: Pulmonary effort is normal.     Breath sounds: Normal breath sounds.  Musculoskeletal:     Cervical back: No tenderness.     Right lower leg: Edema present.     Left lower leg: Edema present.     Comments: Mostly edema both ankles and feet  Lymphadenopathy:     Cervical: No cervical adenopathy.  Skin:    General: Skin is warm and dry.     Capillary Refill: Capillary refill takes less than 2 seconds.  Neurological:     General: No focal deficit present.     Mental Status: She is alert and oriented to person, place, and time.  Psychiatric:        Mood and Affect: Mood normal.        Behavior: Behavior normal.     ASSESSMENT & PLAN: A total of 50 minutes was spent with the patient and counseling/coordination of care regarding preparing for this visit, review of most recent office visit notes, review of most recent hospital stay notes, review of most recent blood work results, review of most recent CT scan of abdomen and pelvis report, review of all medications and changes made, cardiovascular risks associated with hypertension, education on nutrition, differential diagnosis of lower leg edema, prognosis, documentation and need for follow-up.  Problem List Items Addressed This Visit       Cardiovascular and Mediastinum   Essential hypertension - Primary (Chronic)    Well-controlled hypertension. However amlodipine creating undesirable side effects affecting quality of life. Stop amlodipine and start valsartan-HCTZ 160-12.5 mg daily. Dietary  approaches to stop hypertension discussed. Advised to monitor blood pressure readings several times a day for the next  couple weeks and keep a log.  Advised to contact the office if numbers persistently high. Follow-up in 3 months.      Relevant Medications   valsartan-hydrochlorothiazide (DIOVAN-HCT) 160-12.5 MG tablet     Other   Dyslipidemia    Stable.  Diet and nutrition discussed. Continue rosuvastatin 10 mg daily. The 10-year ASCVD risk score (Arnett DK, et al., 2019) is: 11%   Values used to calculate the score:     Age: 54 years     Sex: Female     Is Non-Hispanic African  American: Yes     Diabetic: No     Tobacco smoker: No     Systolic Blood Pressure: 120 mmHg     Is BP treated: Yes     HDL Cholesterol: 63.6 mg/dL     Total Cholesterol: 143 mg/dL       Lower extremity edema    Most likely secondary to amlodipine use. We will stop amlodipine and start valsartan-HCTZ 160--12.5 mg daily. Diet and nutrition discussed.      Other Visit Diagnoses     Hospital discharge follow-up       Colon cancer screening       Relevant Orders   Cologuard        Patient Instructions  Stop amlodipine. Start valsartan-HCTZ 160-12.5 mg daily. Monitor blood pressure reading at home daily for the next several weeks and keep a log. Contact the office if numbers persistently high.  Hypertension, Adult High blood pressure (hypertension) is when the force of blood pumping through the arteries is too strong. The arteries are the blood vessels that carry blood from the heart throughout the body. Hypertension forces the heart to work harder to pump blood and may cause arteries to become narrow or stiff. Untreated or uncontrolled hypertension can lead to a heart attack, heart failure, a stroke, kidney disease, and other problems. A blood pressure reading consists of a higher number over a lower number. Ideally, your blood pressure should be below 120/80. The first ("top") number is  called the systolic pressure. It is a measure of the pressure in your arteries as your heart beats. The second ("bottom") number is called the diastolic pressure. It is a measure of the pressure in your arteries as the heart relaxes. What are the causes? The exact cause of this condition is not known. There are some conditions that result in high blood pressure. What increases the risk? Certain factors may make you more likely to develop high blood pressure. Some of these risk factors are under your control, including: Smoking. Not getting enough exercise or physical activity. Being overweight. Having too much fat, sugar, calories, or salt (sodium) in your diet. Drinking too much alcohol. Other risk factors include: Having a personal history of heart disease, diabetes, high cholesterol, or kidney disease. Stress. Having a family history of high blood pressure and high cholesterol. Having obstructive sleep apnea. Age. The risk increases with age. What are the signs or symptoms? High blood pressure may not cause symptoms. Very high blood pressure (hypertensive crisis) may cause: Headache. Fast or irregular heartbeats (palpitations). Shortness of breath. Nosebleed. Nausea and vomiting. Vision changes. Severe chest pain, dizziness, and seizures. How is this diagnosed? This condition is diagnosed by measuring your blood pressure while you are seated, with your arm resting on a flat surface, your legs uncrossed, and your feet flat on the floor. The cuff of the blood pressure monitor will be placed directly against the skin of your upper arm at the level of your heart. Blood pressure should be measured at least twice using the same arm. Certain conditions can cause a difference in blood pressure between your right and left arms. If you have a high blood pressure reading during one visit or you have normal blood pressure with other risk factors, you may be asked to: Return on a different day to  have your blood pressure checked again. Monitor your blood pressure at home for 1 week or longer. If you are diagnosed with hypertension, you may have other blood or  imaging tests to help your health care provider understand your overall risk for other conditions. How is this treated? This condition is treated by making healthy lifestyle changes, such as eating healthy foods, exercising more, and reducing your alcohol intake. You may be referred for counseling on a healthy diet and physical activity. Your health care provider may prescribe medicine if lifestyle changes are not enough to get your blood pressure under control and if: Your systolic blood pressure is above 130. Your diastolic blood pressure is above 80. Your personal target blood pressure may vary depending on your medical conditions, your age, and other factors. Follow these instructions at home: Eating and drinking  Eat a diet that is high in fiber and potassium, and low in sodium, added sugar, and fat. An example of this eating plan is called the DASH diet. DASH stands for Dietary Approaches to Stop Hypertension. To eat this way: Eat plenty of fresh fruits and vegetables. Try to fill one half of your plate at each meal with fruits and vegetables. Eat whole grains, such as whole-wheat pasta, brown rice, or whole-grain bread. Fill about one fourth of your plate with whole grains. Eat or drink low-fat dairy products, such as skim milk or low-fat yogurt. Avoid fatty cuts of meat, processed or cured meats, and poultry with skin. Fill about one fourth of your plate with lean proteins, such as fish, chicken without skin, beans, eggs, or tofu. Avoid pre-made and processed foods. These tend to be higher in sodium, added sugar, and fat. Reduce your daily sodium intake. Many people with hypertension should eat less than 1,500 mg of sodium a day. Do not drink alcohol if: Your health care provider tells you not to drink. You are pregnant, may  be pregnant, or are planning to become pregnant. If you drink alcohol: Limit how much you have to: 0-1 drink a day for women. 0-2 drinks a day for men. Know how much alcohol is in your drink. In the [place] one drink equals one 12 oz bottle of beer (355 mL), one 5 oz glass of wine (148 mL), or one 1 oz glass of hard liquor (44 mL). Lifestyle  Work with your health care provider to maintain a healthy body weight or to lose weight. Ask what an ideal weight is for you. Get at least 30 minutes of exercise that causes your heart to beat faster (aerobic exercise) most days of the week. Activities may include walking, swimming, or biking. Include exercise to strengthen your muscles (resistance exercise), such as Pilates or lifting weights, as part of your weekly exercise routine. Try to do these types of exercises for 30 minutes at least 3 days a week. Do not use any products that contain nicotine or tobacco. These products include cigarettes, chewing tobacco, and vaping devices, such as e-cigarettes. If you need help quitting, ask your health care provider. Monitor your blood pressure at home as told by your health care provider. Keep all follow-up visits. This is important. Medicines Take over-the-counter and prescription medicines only as told by your health care provider. Follow directions carefully. Blood pressure medicines must be taken as prescribed. Do not skip doses of blood pressure medicine. Doing this puts you at risk for problems and can make the medicine less effective. Ask your health care provider about side effects or reactions to medicines that you should watch for. Contact a health care provider if you: Think you are having a reaction to a medicine you are taking. Have headaches that  keep coming back (recurring). Feel dizzy. Have swelling in your ankles. Have trouble with your vision. Get help right away if you: Develop a severe headache or confusion. Have unusual weakness or  numbness. Feel faint. Have severe pain in your chest or abdomen. Vomit repeatedly. Have trouble breathing. These symptoms may be an emergency. Get help right away. Call 911. Do not wait to see if the symptoms will go away. Do not drive yourself to the hospital. Summary Hypertension is when the force of blood pumping through your arteries is too strong. If this condition is not controlled, it may put you at risk for serious complications. Your personal target blood pressure may vary depending on your medical conditions, your age, and other factors. For most people, a normal blood pressure is less than 120/80. Hypertension is treated with lifestyle changes, medicines, or a combination of both. Lifestyle changes include losing weight, eating a healthy, low-sodium diet, exercising more, and limiting alcohol. This information is not intended to replace advice given to you by your health care provider. Make sure you discuss any questions you have with your health care provider. Document Revised: 01/29/2021 Document Reviewed: 01/29/2021 Elsevier Patient Education  2023 [employer]    Edwina Barth, MD [facility]

## 2021-10-21 NOTE — Assessment & Plan Note (Signed)
Stable.  Diet and nutrition discussed. Continue rosuvastatin 10 mg daily. The 10-year ASCVD risk score (Arnett DK, et al., 2019) is: 11%   Values used to calculate the score:     Age: 76 years     Sex: Female     Is Non-Hispanic African American: Yes     Diabetic: No     Tobacco smoker: No     Systolic Blood Pressure: 120 mmHg     Is BP treated: Yes     HDL Cholesterol: 63.6 mg/dL     Total Cholesterol: 143 mg/dL

## 2021-10-21 NOTE — Assessment & Plan Note (Signed)
Most likely secondary to amlodipine use. We will stop amlodipine and start valsartan-HCTZ 160--12.5 mg daily. Diet and nutrition discussed.

## 2022-01-20 ENCOUNTER — Telehealth: Payer: Self-pay

## 2022-01-20 NOTE — Telephone Encounter (Signed)
error 

## 2022-01-21 ENCOUNTER — Encounter: Payer: Self-pay | Admitting: Emergency Medicine

## 2022-01-21 ENCOUNTER — Ambulatory Visit: Payer: Federal, State, Local not specified - PPO | Admitting: Emergency Medicine

## 2022-01-21 VITALS — BP 128/76 | HR 72 | Temp 97.9°F | Ht 67.0 in | Wt 205.4 lb

## 2022-01-21 DIAGNOSIS — E785 Hyperlipidemia, unspecified: Secondary | ICD-10-CM

## 2022-01-21 DIAGNOSIS — N6452 Nipple discharge: Secondary | ICD-10-CM

## 2022-01-21 DIAGNOSIS — I10 Essential (primary) hypertension: Secondary | ICD-10-CM

## 2022-01-21 NOTE — Assessment & Plan Note (Signed)
History of breast implants removal about 2 years ago. Recent onset of right nipple bloody discharge. Needs diagnostic mammogram.

## 2022-01-21 NOTE — Progress Notes (Signed)
Danielle Kirby 76 y.o.   Chief Complaint  Patient presents with   Follow-up    3 mnth f/u appt,  few concerns     HISTORY OF PRESENT ILLNESS: This is a 76 y.o. female here for 38-month follow-up of hypertension. Seen by me last month for medication change from amlodipine to valsartan HCTZ due to bilateral ankle and feet edema. Edema much improved and blood pressure well under control. Had breast implants removed about 2 years ago.  Has noticed right nipple bloody discharge that started about 2 months ago.  Intermittent episodes. No other complaints or medical concerns today. BP Readings from Last 3 Encounters:  01/21/22 128/76  10/21/21 120/66  10/15/21 103/75   Wt Readings from Last 3 Encounters:  01/21/22 205 lb 6 oz (93.2 kg)  10/21/21 204 lb 6 oz (92.7 kg)  08/14/21 203 lb 6 oz (92.3 kg)     HPI   Prior to Admission medications   Medication Sig Start Date End Date Taking? Authorizing Provider  latanoprost (XALATAN) 0.005 % ophthalmic solution Place 1 drop into both eyes at bedtime. 09/21/21  Yes [provider]  Multiple Vitamin (MULTIVITAMIN WITH MINERALS) TABS tablet Take 1 tablet by mouth daily. 08/29/20  Yes Angiulli, Lavon Paganini, PA-C  rosuvastatin (CRESTOR) 10 MG tablet Take 1 tablet (10 mg total) by mouth daily. 04/16/21  Yes Horris Speros, Ines Bloomer, MD  valsartan-hydrochlorothiazide (DIOVAN-HCT) 160-12.5 MG tablet Take 1 tablet by mouth daily. 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   Degenerative arthritis of hip 05/23/2011    Past Medical History:  Diagnosis  Date   Allergy    Arthritis    Cataract    Heart murmur     Past Surgical History:  Procedure Laterality Date   BREAST SURGERY     COSMETIC SURGERY     EYE SURGERY     TUBAL LIGATION      Social History   Socioeconomic History   Marital status: Significant Other    Spouse name: Not on file   Number of children: Not on file   Years of education: Not on file   Highest education level: Not on file  Occupational History   Not on file  Tobacco Use   Smoking status: Never   Smokeless tobacco: Never  Substance and Sexual Activity   Alcohol use: No    Alcohol/week: 0.0 standard drinks of alcohol   Drug use: No   Sexual activity: Yes    Birth control/protection: None  Other Topics Concern   Not on file  Social History Narrative   Not on file   Social Determinants of Health   Financial Resource Strain: Not on file  Food Insecurity: Not on file  Transportation Needs: Not on file  Physical Activity: Not on file  Stress: Not on file  Social Connections: Not on file  Intimate Partner Violence: Not on file    Family History  Problem Relation Age of Onset   Diabetes Mother    Hypertension Father      Review  of Systems  Constitutional: Negative.  Negative for chills and fever.  HENT: Negative.  Negative for congestion and sore throat.   Respiratory: Negative.  Negative for cough and shortness of breath.   Cardiovascular: Negative.  Negative for chest pain and palpitations.  Gastrointestinal:  Negative for abdominal pain, nausea and vomiting.  Genitourinary: Negative.  Negative for dysuria and hematuria.  Skin: Negative.  Negative for rash.  Neurological: Negative.  Negative for dizziness and headaches.  All other systems reviewed and are negative.  Today's Vitals   01/21/22 1322  BP: 128/76  Pulse: 72  Temp: 97.9 F (36.6 C)  TempSrc: Oral  SpO2: 98%  Weight: 205 lb 6 oz (93.2 kg)  Height: 5\' 7"  (1.702 m)   Body mass index is 32.17 kg/m.   Physical  Exam Vitals reviewed.  Constitutional:      Appearance: Normal appearance.  HENT:     Head: Normocephalic.     Mouth/Throat:     Mouth: Mucous membranes are moist.     Pharynx: Oropharynx is clear.  Eyes:     Extraocular Movements: Extraocular movements intact.     Conjunctiva/sclera: Conjunctivae normal.     Pupils: Pupils are equal, round, and reactive to light.  Cardiovascular:     Rate and Rhythm: Normal rate and regular rhythm.     Pulses: Normal pulses.     Heart sounds: Murmur heard.  Pulmonary:     Effort: Pulmonary effort is normal.     Breath sounds: Normal breath sounds.  Musculoskeletal:     Cervical back: No tenderness.     Right lower leg: No edema.     Left lower leg: No edema.  Lymphadenopathy:     Cervical: No cervical adenopathy.  Skin:    General: Skin is warm and dry.  Neurological:     General: No focal deficit present.     Mental Status: She is alert and oriented to person, place, and time.  Psychiatric:        Mood and Affect: Mood normal.        Behavior: Behavior normal.     ASSESSMENT & PLAN: A total of 42 minutes was spent with the patient and counseling/coordination of care regarding preparing for this visit, review of most recent office visit notes, review of multiple chronic medical problems and their management including hypertension and cardiovascular risks associated with this condition, review of all medications, differential diagnosis of bloody nipple discharge and need for diagnostic mammogram, prognosis, documentation and need for follow-up.  Problem List Items Addressed This Visit       Cardiovascular and Mediastinum   Essential hypertension - Primary (Chronic)    Well-controlled hypertension. Continue valsartan hydrochlorothiazide 160-12.5 mg daily. BP Readings from Last 3 Encounters:  01/21/22 128/76  10/21/21 120/66  10/15/21 103/75           Other   Dyslipidemia    Stable.  Diet and nutrition discussed. Continue  rosuvastatin 10 mg daily.      Bloody discharge from right nipple    History of breast implants removal about 2 years ago. Recent onset of right nipple bloody discharge. Needs diagnostic mammogram.      Relevant Orders   MM Digital Diagnostic Unilat R   Patient Instructions  Hypertension, Adult High blood pressure (hypertension) is when the force of blood pumping through the arteries is too strong. The arteries are the blood vessels that carry blood from the heart throughout the body. Hypertension forces the heart  to work harder to pump blood and may cause arteries to become narrow or stiff. Untreated or uncontrolled hypertension can lead to a heart attack, heart failure, a stroke, kidney disease, and other problems. A blood pressure reading consists of a higher number over a lower number. Ideally, your blood pressure should be below 120/80. The first ("top") number is called the systolic pressure. It is a measure of the pressure in your arteries as your heart beats. The second ("bottom") number is called the diastolic pressure. It is a measure of the pressure in your arteries as the heart relaxes. What are the causes? The exact cause of this condition is not known. There are some conditions that result in high blood pressure. What increases the risk? Certain factors may make you more likely to develop high blood pressure. Some of these risk factors are under your control, including: Smoking. Not getting enough exercise or physical activity. Being overweight. Having too much fat, sugar, calories, or salt (sodium) in your diet. Drinking too much alcohol. Other risk factors include: Having a personal history of heart disease, diabetes, high cholesterol, or kidney disease. Stress. Having a family history of high blood pressure and high cholesterol. Having obstructive sleep apnea. Age. The risk increases with age. What are the signs or symptoms? High blood pressure may not cause symptoms.  Very high blood pressure (hypertensive crisis) may cause: Headache. Fast or irregular heartbeats (palpitations). Shortness of breath. Nosebleed. Nausea and vomiting. Vision changes. Severe chest pain, dizziness, and seizures. How is this diagnosed? This condition is diagnosed by measuring your blood pressure while you are seated, with your arm resting on a flat surface, your legs uncrossed, and your feet flat on the floor. The cuff of the blood pressure monitor will be placed directly against the skin of your upper arm at the level of your heart. Blood pressure should be measured at least twice using the same arm. Certain conditions can cause a difference in blood pressure between your right and left arms. If you have a high blood pressure reading during one visit or you have normal blood pressure with other risk factors, you may be asked to: Return on a different day to have your blood pressure checked again. Monitor your blood pressure at home for 1 week or longer. If you are diagnosed with hypertension, you may have other blood or imaging tests to help your health care provider understand your overall risk for other conditions. How is this treated? This condition is treated by making healthy lifestyle changes, such as eating healthy foods, exercising more, and reducing your alcohol intake. You may be referred for counseling on a healthy diet and physical activity. Your health care provider may prescribe medicine if lifestyle changes are not enough to get your blood pressure under control and if: Your systolic blood pressure is above 130. Your diastolic blood pressure is above 80. Your personal target blood pressure may vary depending on your medical conditions, your age, and other factors. Follow these instructions at home: Eating and drinking  Eat a diet that is high in fiber and potassium, and low in sodium, added sugar, and fat. An example of this eating plan is called the DASH diet.  DASH stands for Dietary Approaches to Stop Hypertension. To eat this way: Eat plenty of fresh fruits and vegetables. Try to fill one half of your plate at each meal with fruits and vegetables. Eat whole grains, such as whole-wheat pasta, brown rice, or whole-grain bread. Fill about one fourth  of your plate with whole grains. Eat or drink low-fat dairy products, such as skim milk or low-fat yogurt. Avoid fatty cuts of meat, processed or cured meats, and poultry with skin. Fill about one fourth of your plate with lean proteins, such as fish, chicken without skin, beans, eggs, or tofu. Avoid pre-made and processed foods. These tend to be higher in sodium, added sugar, and fat. Reduce your daily sodium intake. Many people with hypertension should eat less than 1,500 mg of sodium a day. Do not drink alcohol if: Your health care provider tells you not to drink. You are pregnant, may be pregnant, or are planning to become pregnant. If you drink alcohol: Limit how much you have to: 0-1 drink a day for women. 0-2 drinks a day for men. Know how much alcohol is in your drink. In the [place] one drink equals one 12 oz bottle of beer (355 mL), one 5 oz glass of wine (148 mL), or one 1 oz glass of hard liquor (44 mL). Lifestyle  Work with your health care provider to maintain a healthy body weight or to lose weight. Ask what an ideal weight is for you. Get at least 30 minutes of exercise that causes your heart to beat faster (aerobic exercise) most days of the week. Activities may include walking, swimming, or biking. Include exercise to strengthen your muscles (resistance exercise), such as Pilates or lifting weights, as part of your weekly exercise routine. Try to do these types of exercises for 30 minutes at least 3 days a week. Do not use any products that contain nicotine or tobacco. These products include cigarettes, chewing tobacco, and vaping devices, such as e-cigarettes. If you need help quitting, ask  your health care provider. Monitor your blood pressure at home as told by your health care provider. Keep all follow-up visits. This is important. Medicines Take over-the-counter and prescription medicines only as told by your health care provider. Follow directions carefully. Blood pressure medicines must be taken as prescribed. Do not skip doses of blood pressure medicine. Doing this puts you at risk for problems and can make the medicine less effective. Ask your health care provider about side effects or reactions to medicines that you should watch for. Contact a health care provider if you: Think you are having a reaction to a medicine you are taking. Have headaches that keep coming back (recurring). Feel dizzy. Have swelling in your ankles. Have trouble with your vision. Get help right away if you: Develop a severe headache or confusion. Have unusual weakness or numbness. Feel faint. Have severe pain in your chest or abdomen. Vomit repeatedly. Have trouble breathing. These symptoms may be an emergency. Get help right away. Call 911. Do not wait to see if the symptoms will go away. Do not drive yourself to the hospital. Summary Hypertension is when the force of blood pumping through your arteries is too strong. If this condition is not controlled, it may put you at risk for serious complications. Your personal target blood pressure may vary depending on your medical conditions, your age, and other factors. For most people, a normal blood pressure is less than 120/80. Hypertension is treated with lifestyle changes, medicines, or a combination of both. Lifestyle changes include losing weight, eating a healthy, low-sodium diet, exercising more, and limiting alcohol. This information is not intended to replace advice given to you by your health care provider. Make sure you discuss any questions you have with your health care provider. Document Revised:  01/29/2021 Document Reviewed:  01/29/2021 Elsevier Patient Education  2023 Elsevier Inc.    Edwina BarthMiguel Channa Hazelett, MD [facility]

## 2022-01-21 NOTE — Assessment & Plan Note (Signed)
Well-controlled hypertension. Continue valsartan hydrochlorothiazide 160-12.5 mg daily. BP Readings from Last 3 Encounters:  01/21/22 128/76  10/21/21 120/66  10/15/21 103/75

## 2022-01-21 NOTE — Patient Instructions (Signed)
Hypertension, Adult High blood pressure (hypertension) is when the force of blood pumping through the arteries is too strong. The arteries are the blood vessels that carry blood from the heart throughout the body. Hypertension forces the heart to work harder to pump blood and may cause arteries to become narrow or stiff. Untreated or uncontrolled hypertension can lead to a heart attack, heart failure, a stroke, kidney disease, and other problems. A blood pressure reading consists of a higher number over a lower number. Ideally, your blood pressure should be below 120/80. The first ("top") number is called the systolic pressure. It is a measure of the pressure in your arteries as your heart beats. The second ("bottom") number is called the diastolic pressure. It is a measure of the pressure in your arteries as the heart relaxes. What are the causes? The exact cause of this condition is not known. There are some conditions that result in high blood pressure. What increases the risk? Certain factors may make you more likely to develop high blood pressure. Some of these risk factors are under your control, including: Smoking. Not getting enough exercise or physical activity. Being overweight. Having too much fat, sugar, calories, or salt (sodium) in your diet. Drinking too much alcohol. Other risk factors include: Having a personal history of heart disease, diabetes, high cholesterol, or kidney disease. Stress. Having a family history of high blood pressure and high cholesterol. Having obstructive sleep apnea. Age. The risk increases with age. What are the signs or symptoms? High blood pressure may not cause symptoms. Very high blood pressure (hypertensive crisis) may cause: Headache. Fast or irregular heartbeats (palpitations). Shortness of breath. Nosebleed. Nausea and vomiting. Vision changes. Severe chest pain, dizziness, and seizures. How is this diagnosed? This condition is diagnosed by  measuring your blood pressure while you are seated, with your arm resting on a flat surface, your legs uncrossed, and your feet flat on the floor. The cuff of the blood pressure monitor will be placed directly against the skin of your upper arm at the level of your heart. Blood pressure should be measured at least twice using the same arm. Certain conditions can cause a difference in blood pressure between your right and left arms. If you have a high blood pressure reading during one visit or you have normal blood pressure with other risk factors, you may be asked to: Return on a different day to have your blood pressure checked again. Monitor your blood pressure at home for 1 week or longer. If you are diagnosed with hypertension, you may have other blood or imaging tests to help your health care provider understand your overall risk for other conditions. How is this treated? This condition is treated by making healthy lifestyle changes, such as eating healthy foods, exercising more, and reducing your alcohol intake. You may be referred for counseling on a healthy diet and physical activity. Your health care provider may prescribe medicine if lifestyle changes are not enough to get your blood pressure under control and if: Your systolic blood pressure is above 130. Your diastolic blood pressure is above 80. Your personal target blood pressure may vary depending on your medical conditions, your age, and other factors. Follow these instructions at home: Eating and drinking  Eat a diet that is high in fiber and potassium, and low in sodium, added sugar, and fat. An example of this eating plan is called the DASH diet. DASH stands for Dietary Approaches to Stop Hypertension. To eat this way: Eat   plenty of fresh fruits and vegetables. Try to fill one half of your plate at each meal with fruits and vegetables. Eat whole grains, such as whole-wheat pasta, brown rice, or whole-grain bread. Fill about one  fourth of your plate with whole grains. Eat or drink low-fat dairy products, such as skim milk or low-fat yogurt. Avoid fatty cuts of meat, processed or cured meats, and poultry with skin. Fill about one fourth of your plate with lean proteins, such as fish, chicken without skin, beans, eggs, or tofu. Avoid pre-made and processed foods. These tend to be higher in sodium, added sugar, and fat. Reduce your daily sodium intake. Many people with hypertension should eat less than 1,500 mg of sodium a day. Do not drink alcohol if: Your health care provider tells you not to drink. You are pregnant, may be pregnant, or are planning to become pregnant. If you drink alcohol: Limit how much you have to: 0-1 drink a day for women. 0-2 drinks a day for men. Know how much alcohol is in your drink. In the [place] one drink equals one 12 oz bottle of beer (355 mL), one 5 oz glass of wine (148 mL), or one 1 oz glass of hard liquor (44 mL). Lifestyle  Work with your health care provider to maintain a healthy body weight or to lose weight. Ask what an ideal weight is for you. Get at least 30 minutes of exercise that causes your heart to beat faster (aerobic exercise) most days of the week. Activities may include walking, swimming, or biking. Include exercise to strengthen your muscles (resistance exercise), such as Pilates or lifting weights, as part of your weekly exercise routine. Try to do these types of exercises for 30 minutes at least 3 days a week. Do not use any products that contain nicotine or tobacco. These products include cigarettes, chewing tobacco, and vaping devices, such as e-cigarettes. If you need help quitting, ask your health care provider. Monitor your blood pressure at home as told by your health care provider. Keep all follow-up visits. This is important. Medicines Take over-the-counter and prescription medicines only as told by your health care provider. Follow directions carefully. Blood  pressure medicines must be taken as prescribed. Do not skip doses of blood pressure medicine. Doing this puts you at risk for problems and can make the medicine less effective. Ask your health care provider about side effects or reactions to medicines that you should watch for. Contact a health care provider if you: Think you are having a reaction to a medicine you are taking. Have headaches that keep coming back (recurring). Feel dizzy. Have swelling in your ankles. Have trouble with your vision. Get help right away if you: Develop a severe headache or confusion. Have unusual weakness or numbness. Feel faint. Have severe pain in your chest or abdomen. Vomit repeatedly. Have trouble breathing. These symptoms may be an emergency. Get help right away. Call 911. Do not wait to see if the symptoms will go away. Do not drive yourself to the hospital. Summary Hypertension is when the force of blood pumping through your arteries is too strong. If this condition is not controlled, it may put you at risk for serious complications. Your personal target blood pressure may vary depending on your medical conditions, your age, and other factors. For most people, a normal blood pressure is less than 120/80. Hypertension is treated with lifestyle changes, medicines, or a combination of both. Lifestyle changes include losing weight, eating a healthy,   low-sodium diet, exercising more, and limiting alcohol. This information is not intended to replace advice given to you by your health care provider. Make sure you discuss any questions you have with your health care provider. Document Revised: 01/29/2021 Document Reviewed: 01/29/2021 Elsevier Patient Education  2023 Elsevier Inc.  

## 2022-01-21 NOTE — Assessment & Plan Note (Signed)
Stable.  Diet and nutrition discussed.  Continue rosuvastatin 10 mg daily.  

## 2022-02-10 ENCOUNTER — Telehealth: Payer: Self-pay | Admitting: Emergency Medicine

## 2022-02-10 DIAGNOSIS — N6452 Nipple discharge: Secondary | ICD-10-CM

## 2022-02-10 NOTE — Telephone Encounter (Signed)
New order for Mammogram and ultrasound entered

## 2022-02-10 NOTE — Telephone Encounter (Signed)
Patient called back and said that it needs to be for both breasts not just the right breast.

## 2022-02-10 NOTE — Telephone Encounter (Signed)
Patient called because she was trying to schedule her mammogram that was put in but they wouldn't do it without a previous mammogram to compare it to because it was put in as MM Digital Diagnostic Unilat R. I talked to the Breast center of [facility] and they said it would have to be put in as a Full Bilateral Diagnostic mammogram with a order put in for a Ultrasound of the right breast. Please advise

## 2022-02-13 ENCOUNTER — Other Ambulatory Visit: Payer: Self-pay | Admitting: Emergency Medicine

## 2022-02-13 DIAGNOSIS — N6452 Nipple discharge: Secondary | ICD-10-CM

## 2022-02-17 ENCOUNTER — Encounter: Payer: Federal, State, Local not specified - PPO | Admitting: Emergency Medicine

## 2022-02-17 ENCOUNTER — Encounter: Payer: Self-pay | Admitting: Emergency Medicine

## 2022-02-17 NOTE — Progress Notes (Signed)
Error

## 2022-03-25 IMAGING — CR DG ELBOW COMPLETE 3+V*R*
4 series · 4 of 4 positions shown · non-contrast
Comparison: Elbow CT 07/26/2020

CLINICAL DATA: Fracture

EXAM:
RIGHT ELBOW - COMPLETE 3+ VIEW

[elbow ap]
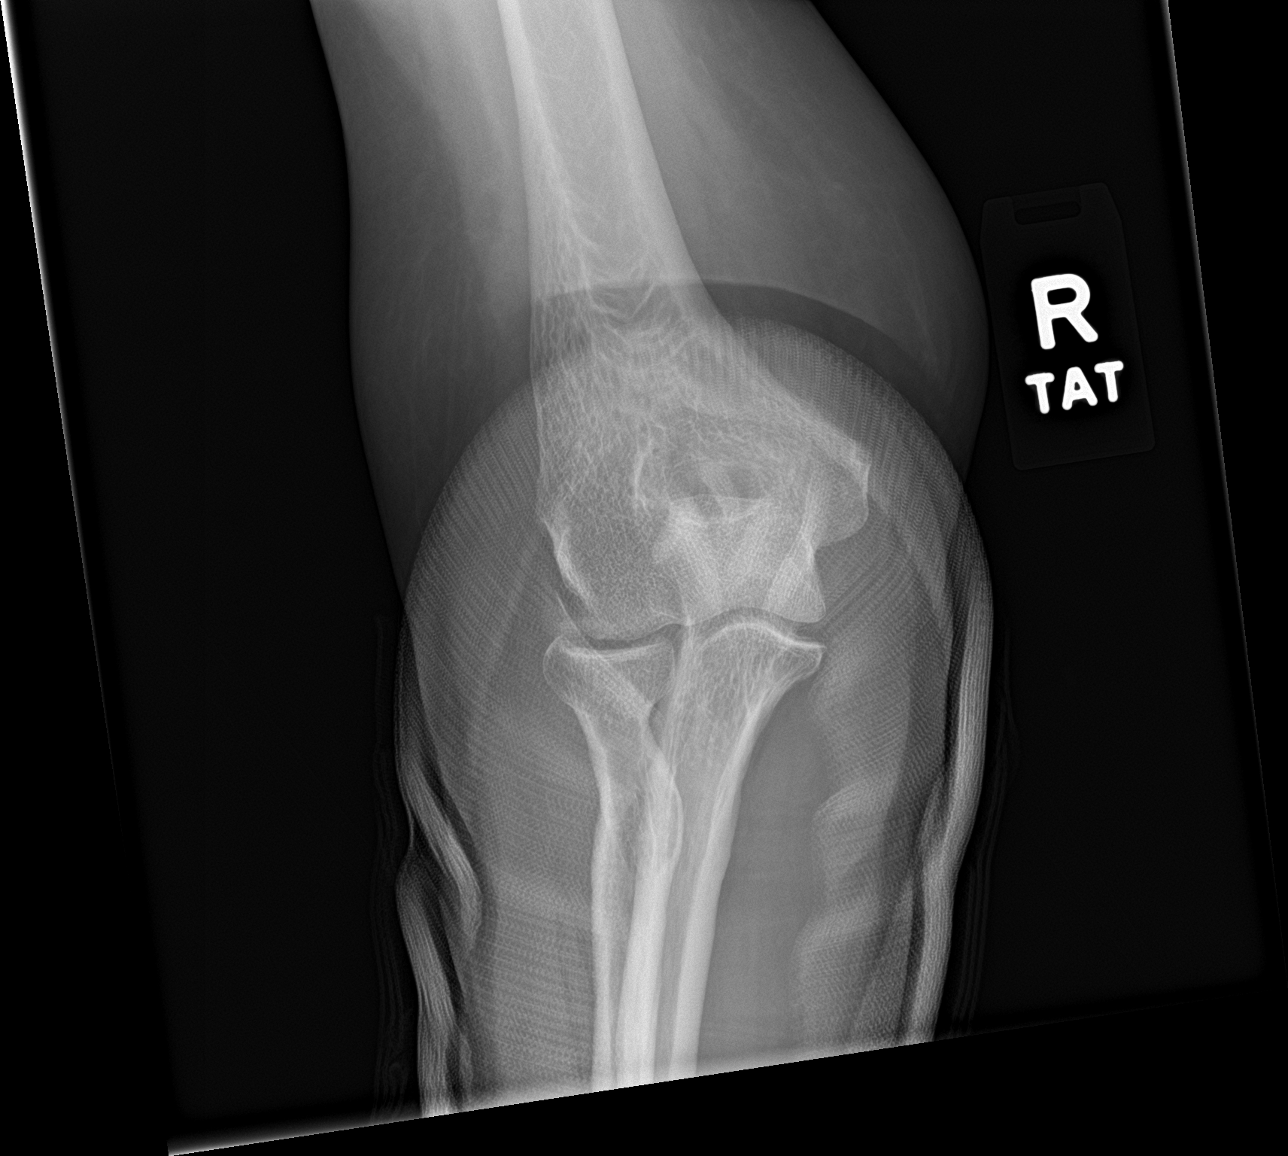

[elbow obl (1 of 2)]
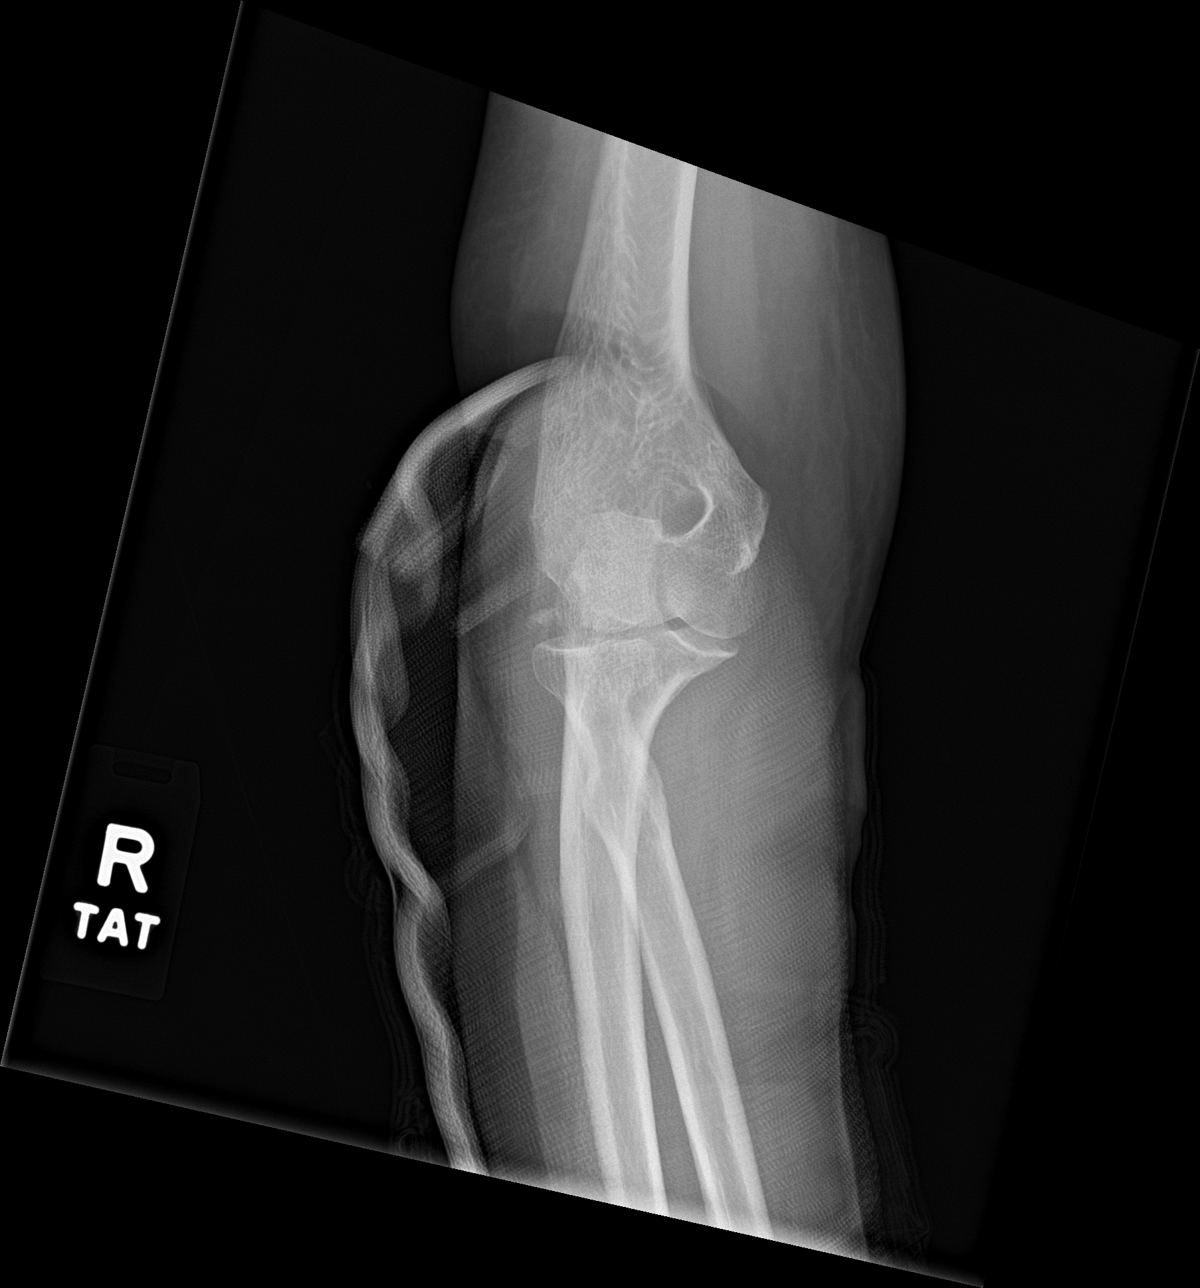

[elbow obl (2 of 2)]
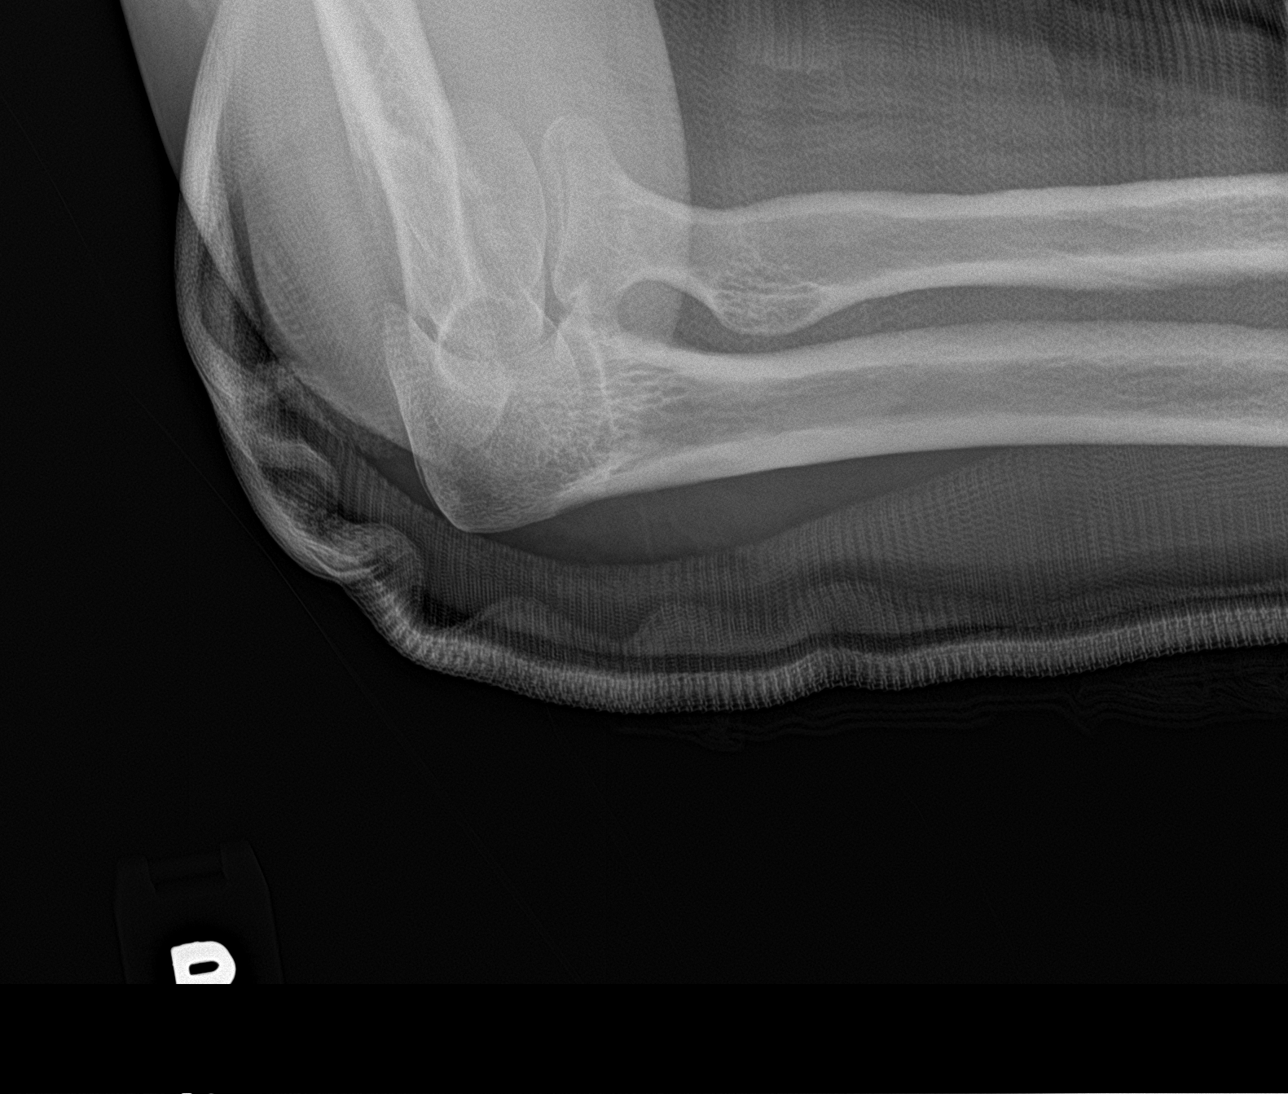

[elbow lat]
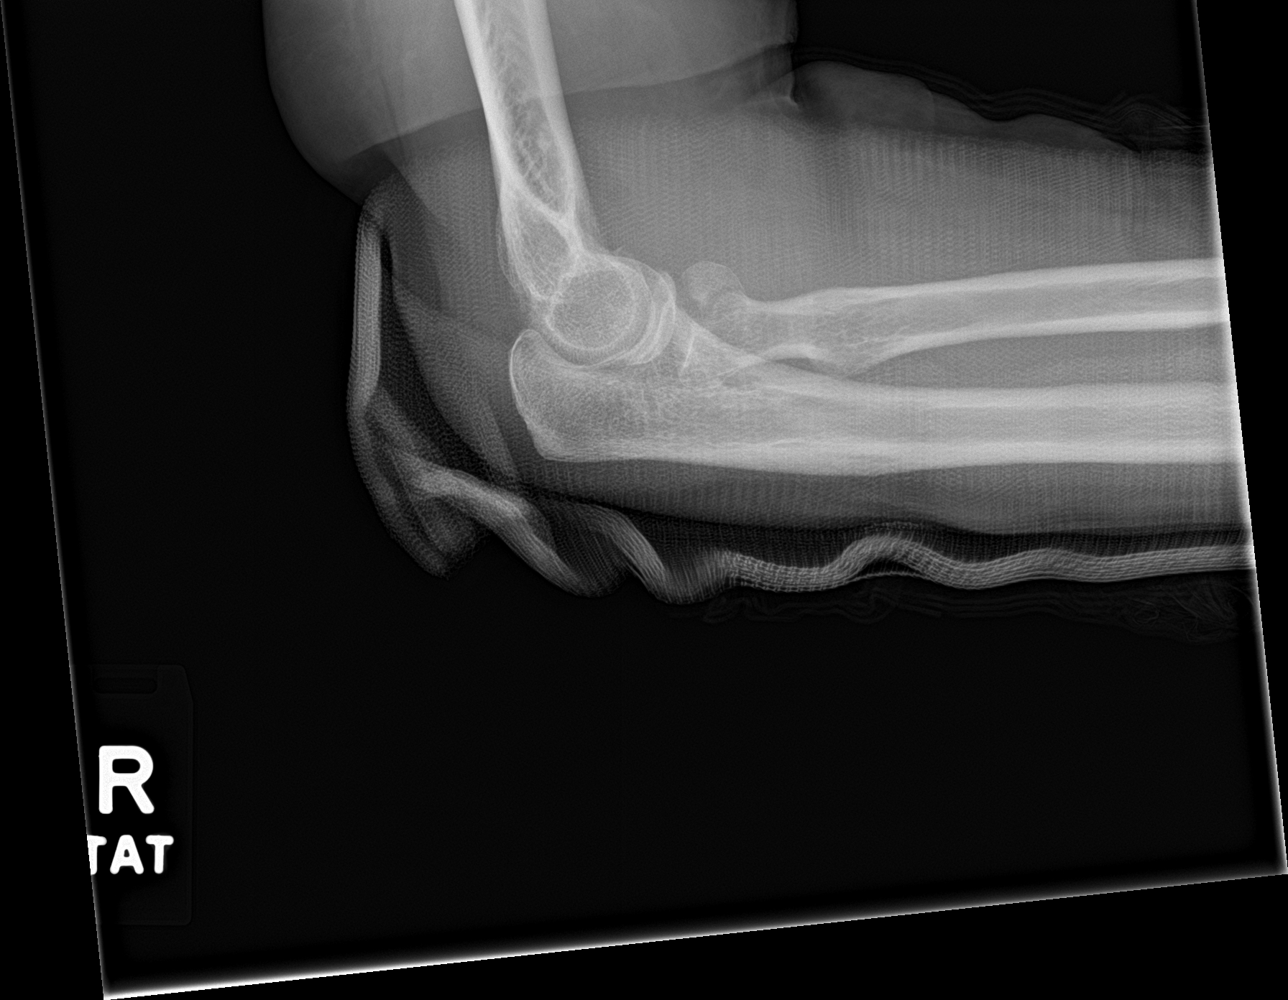

[4 of 4 positions shown; findings below may reference images not displayed]

FINDINGS: Unchanged alignment of the radial head and neck fracture including
the articular surface splint. Unchanged small capitellar fracture
with small displaced fragment. Unchanged alignment of the coronoid
process fracture.
IMPRESSION: Unchanged alignment of the radial head/neck, capitellum, and
coronoid process fractures

## 2022-03-25 IMAGING — CR DG WRIST 2V*R*
3 series · 3 of 3 positions shown · non-contrast
Comparison: CT 07/26/2020

CLINICAL DATA: Fracture

EXAM:
RIGHT WRIST - 2 VIEW

[wrist pa]
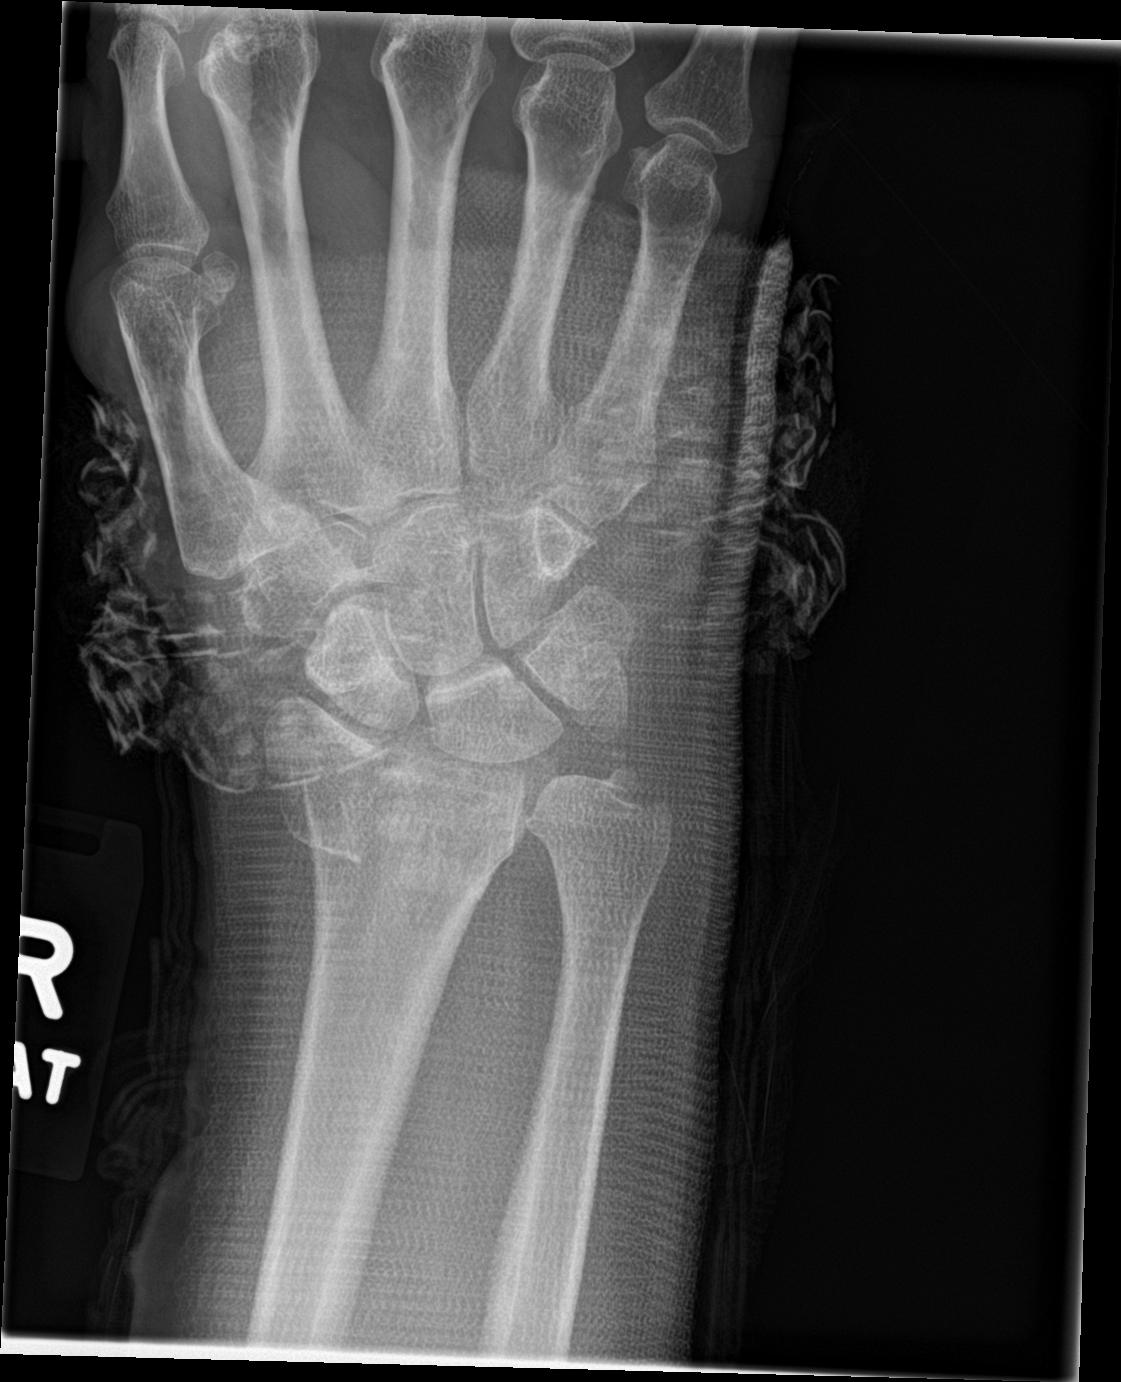

[wrist lat (1 of 2)]
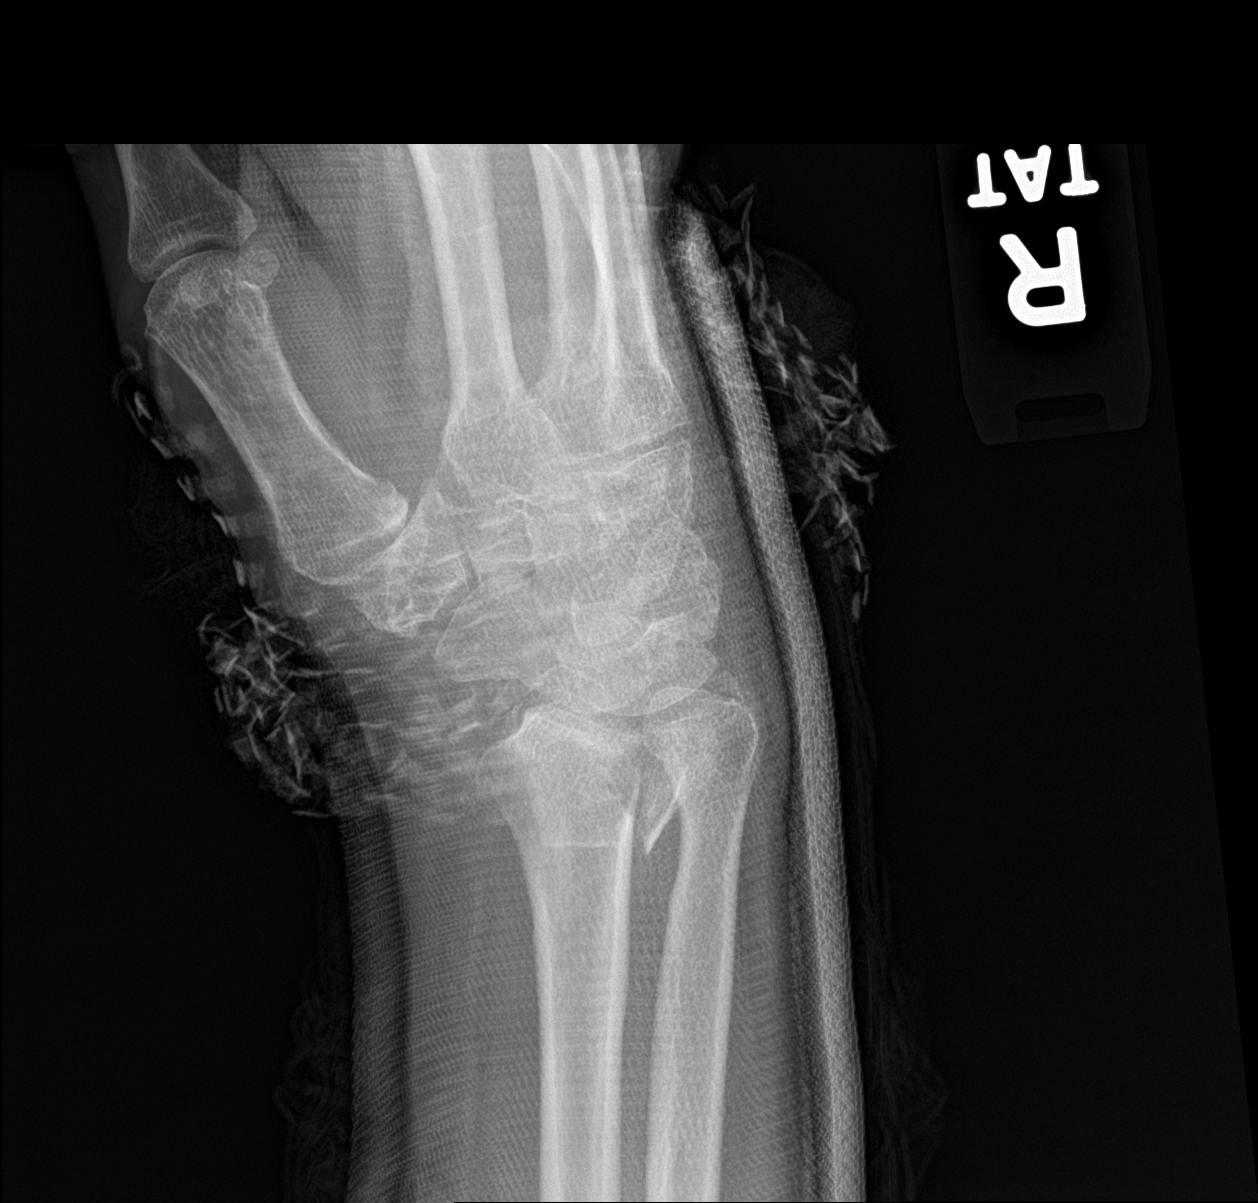

[wrist lat (2 of 2)]
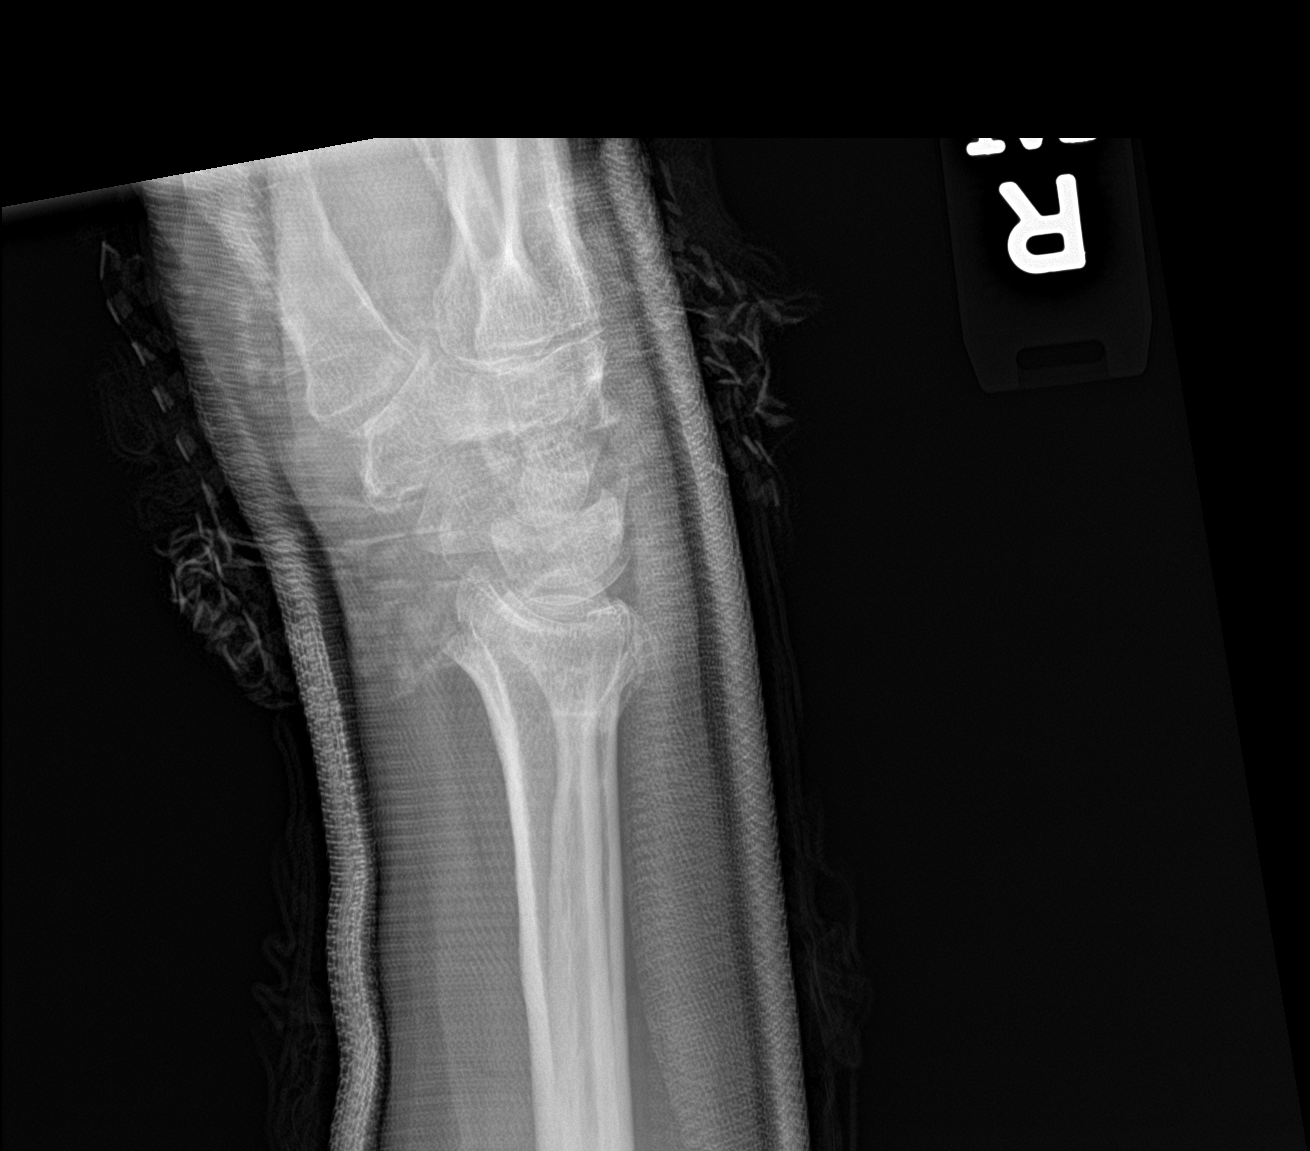

[3 of 3 positions shown; findings below may reference images not displayed]

FINDINGS: There is impacted dorsally angulated distal radial fracture with
intra-articular extension and a mildly displaced ulnar styloid
fracture unchanged in alignment interval casting.
IMPRESSION: Impacted, comminuted intra-articular fracture of the distal radius
with dorsal angulation and mild displaced ulnar styloid fracture
unchanged in alignment with interval casting.

## 2022-03-27 ENCOUNTER — Ambulatory Visit
Admission: RE | Admit: 2022-03-27 | Discharge: 2022-03-27 | Disposition: A | Discharge status: Home / Self Care | Payer: Federal, State, Local not specified - PPO | Source: Ambulatory Visit | Attending: Emergency Medicine | Admitting: Emergency Medicine

## 2022-03-27 ENCOUNTER — Other Ambulatory Visit: Payer: Self-pay | Admitting: Emergency Medicine

## 2022-03-27 DIAGNOSIS — N6452 Nipple discharge: Secondary | ICD-10-CM

## 2022-03-27 NOTE — Progress Notes (Signed)
Call patient and make sure she is aware of these findings.  Please order MRI as recommended by radiologist along with breast surgery referral.  Thanks.

## 2022-03-28 ENCOUNTER — Other Ambulatory Visit: Payer: Self-pay | Admitting: *Deleted

## 2022-03-28 ENCOUNTER — Other Ambulatory Visit: Payer: Self-pay | Admitting: Internal Medicine

## 2022-03-28 DIAGNOSIS — N6452 Nipple discharge: Secondary | ICD-10-CM

## 2022-04-02 NOTE — Progress Notes (Signed)
Call patient.  Make sure she is aware of findings and need for MRI.  Thanks.

## 2022-04-22 ENCOUNTER — Other Ambulatory Visit: Payer: Federal, State, Local not specified - PPO

## 2022-04-25 ENCOUNTER — Telehealth: Payer: Self-pay

## 2022-04-25 NOTE — Telephone Encounter (Signed)
Spoke to pt in regards to referral and then [facility]  To clarifying the reason for pt referral, which was put in for Bloody discharge from right nipple

## 2022-05-07 ENCOUNTER — Other Ambulatory Visit: Payer: Federal, State, Local not specified - PPO

## 2022-07-23 ENCOUNTER — Ambulatory Visit: Payer: Federal, State, Local not specified - PPO | Admitting: Emergency Medicine

## 2022-09-08 ENCOUNTER — Ambulatory Visit (INDEPENDENT_AMBULATORY_CARE_PROVIDER_SITE_OTHER): Payer: Federal, State, Local not specified - PPO

## 2022-09-08 ENCOUNTER — Encounter: Payer: Self-pay | Admitting: Emergency Medicine

## 2022-09-08 ENCOUNTER — Ambulatory Visit: Payer: Federal, State, Local not specified - PPO | Admitting: Emergency Medicine

## 2022-09-08 VITALS — BP 128/88 | HR 69 | Temp 97.9°F | Ht 67.0 in | Wt 192.4 lb

## 2022-09-08 DIAGNOSIS — S7001XA Contusion of right hip, initial encounter: Secondary | ICD-10-CM

## 2022-09-08 DIAGNOSIS — N649 Disorder of breast, unspecified: Secondary | ICD-10-CM | POA: Diagnosis not present

## 2022-09-08 NOTE — Assessment & Plan Note (Signed)
Abnormal mammogram and ultrasound done last December MRI of breast was scheduled but patient never showed up We will attempt to reschedule and contact patient

## 2022-09-08 NOTE — Assessment & Plan Note (Signed)
Recent injury creating a little pain and affecting quality of life Patient has history of degenerative arthritis of right hip and x-ray today shows severe osteoarthritis.  No fracture. Pain management discussed.  Patient prefers to take over-the-counter analgesics.

## 2022-09-08 NOTE — Progress Notes (Signed)
Danielle Kirby 77 y.o.   Chief Complaint  Patient presents with   Breast Problem    Breast discharge, white , reddish tint, not sore.     HISTORY OF PRESENT ILLNESS: This is a 77 y.o. female complaining of chronic mid and bilateral nipple discharges. Patient had an abnormal mammogram and ultrasounds done last December.  Was scheduled for MRI of breast but did not show up for appointment Also slipped on wet floor yesterday and landed on her right side injuring right hip, complaining of pain since.  HPI   Prior to Admission medications   Medication Sig Start Date End Date Taking? Authorizing Provider  latanoprost (XALATAN) 0.005 % ophthalmic solution Place 1 drop into both eyes at bedtime. 09/21/21  Yes [provider]  Multiple Vitamin (MULTIVITAMIN WITH MINERALS) TABS tablet Take 1 tablet by mouth daily. 08/29/20  Yes Angiulli, Mcarthur Rossetti, PA-C  rosuvastatin (CRESTOR) 10 MG tablet Take 1 tablet (10 mg total) by mouth daily. Patient not taking: Reported on 09/08/2022 04/16/21   Georgina Quint, MD  valsartan-hydrochlorothiazide (DIOVAN-HCT) 160-12.5 MG tablet Take 1 tablet by mouth daily. 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   Degenerative arthritis of hip 05/23/2011    Past Medical History:  Diagnosis Date   Allergy    Arthritis    Cataract    Heart murmur     Past Surgical History:  Procedure Laterality Date   AUGMENTATION MAMMAPLASTY Bilateral    pt states implants removed, but are still there   BREAST SURGERY     COSMETIC SURGERY     EYE SURGERY     TUBAL LIGATION      Social History   Socioeconomic  History   Marital status: Significant Other    Spouse name: Not on file   Number of children: Not on file   Years of education: Not on file   Highest education level: Not on file  Occupational History   Not on file  Tobacco Use   Smoking status: Never   Smokeless tobacco: Never  Substance and Sexual Activity   Alcohol use: No    Alcohol/week: 0.0 standard drinks of alcohol   Drug use: No   Sexual activity: Yes    Birth control/protection: None  Other Topics Concern   Not on file  Social History Narrative   Not on file   Social Determinants of Health   Financial Resource Strain: Not on file  Food Insecurity: Not on file  Transportation Needs: Not on file  Physical Activity: Not on file  Stress: Not on file  Social Connections: Not on file  Intimate Partner Violence: Not on file    Family History  Problem Relation Age of Onset   Diabetes Mother    Hypertension Father      Review of Systems  Constitutional: Negative.  Negative for chills and fever.  HENT:  Negative for congestion and sore throat.   Respiratory: Negative.  Negative for cough and shortness of breath.   Cardiovascular: Negative.  Negative for chest pain and palpitations.  Gastrointestinal:  Negative for abdominal  pain, diarrhea, nausea and vomiting.  Genitourinary: Negative.  Negative for dysuria and hematuria.  Skin: Negative.  Negative for rash.  Neurological: Negative.  Negative for dizziness and headaches.  All other systems reviewed and are negative.   Vitals:   09/08/22 1557  BP: 128/88  Pulse: 69  Temp: 97.9 F (36.6 C)  SpO2: 98%    Physical Exam Vitals reviewed.  Constitutional:      Appearance: Normal appearance.  HENT:     Head: Normocephalic.  Eyes:     Extraocular Movements: Extraocular movements intact.  Cardiovascular:     Rate and Rhythm: Normal rate.  Pulmonary:     Effort: Pulmonary effort is normal.  Musculoskeletal:     Comments: Right hip: Positive tenderness  with decreased range of motion  Skin:    General: Skin is warm and dry.  Neurological:     Mental Status: She is alert and oriented to person, place, and time.  Psychiatric:        Mood and Affect: Mood normal.        Behavior: Behavior normal.    DG HIP UNILAT W OR W/O PELVIS 2-3 VIEWS RIGHT  Result Date: 09/08/2022 CLINICAL DATA:  Right hip pain. EXAM: DG HIP (WITH OR WITHOUT PELVIS) 2-3V RIGHT COMPARISON:  07/26/2020 FINDINGS: No acute fracture or dislocation identified. There is progression of severe osteoarthritis of the right hip joint with severe joint space narrowing and advanced proliferative disease, subchondral sclerosis and subchondral cyst formation present. No overt osteo necrosis. The bony pelvis is intact. No bony lesions. Mild osteoarthritis of the left hip joint. IMPRESSION: Progression of severe osteoarthritis of the right hip joint. Electronically Signed   By: Irish Lack M.D.   On: 09/08/2022 16:25     ASSESSMENT & PLAN: A total of 45 minutes was spent with the patient and counseling/coordination of care regarding preparing for this visit, review of most recent office visit notes, review of most recent mammogram and ultrasound of breast done last December, need to schedule breast MRI, review of hip x-ray done today, pain management, prognosis, documentation and need for follow-up.  Problem List Items Addressed This Visit       Other   Contusion of right hip - Primary    Recent injury creating a little pain and affecting quality of life Patient has history of degenerative arthritis of right hip and x-ray today shows severe osteoarthritis.  No fracture. Pain management discussed.  Patient prefers to take over-the-counter analgesics.      Relevant Orders   DG HIP UNILAT W OR W/O PELVIS 2-3 VIEWS RIGHT (Completed)   Chronic disorder of breast    Abnormal mammogram and ultrasound done last December MRI of breast was scheduled but patient never showed up We will  attempt to reschedule and contact patient      Patient Instructions  Contusion A contusion is a deep bruise. This is a result of an injury that causes bleeding under the skin. Symptoms of bruising include pain, swelling, and discolored skin. The skin may turn blue, purple, or yellow. Follow these instructions at home: Managing pain, stiffness, and swelling You may use RICE. This stands for: Resting. Icing. Compression, or putting pressure on the injured area. Elevating, or raising the injured area. To follow this method, do these actions: Rest the injured area. If told, put ice on the injured area. To do this: Put ice in a plastic bag. Place a towel between your skin and the bag. Leave the  ice on for 20 minutes, 2-3 times per day. If your skin turns bright red, take off the ice right away to prevent skin damage. The risk of skin damage is higher if you cannot feel pain, heat, or cold. If told, apply compression on the injured area using an elastic bandage. Make sure the bandage is not too tight. If the area tingles or has a loss of feeling (numbness), remove it and put it back on as told by your doctor. If possible, elevate the injured area above the level of your heart while you are sitting or lying down.  General instructions Take over-the-counter and prescription medicines only as told by your doctor. Keep all follow-up visits. Your doctor may want to see how your contusion is healing with treatment. Contact a doctor if: Your symptoms do not get better after several days of treatment. Your symptoms get worse. You have trouble moving the injured area. Get help right away if: You have very bad pain. You have a loss of feeling (numbness) in a hand or foot. Your hand or foot turns pale or cold. This information is not intended to replace advice given to you by your health care provider. Make sure you discuss any questions you have with your health care provider. Document Revised:  09/09/2021 Document Reviewed: 09/09/2021 Elsevier Patient Education  2024 Elsevier Inc.   Edwina Barth, MD [facility]

## 2022-09-08 NOTE — Patient Instructions (Signed)
Contusion A contusion is a deep bruise. This is a result of an injury that causes bleeding under the skin. Symptoms of bruising include pain, swelling, and discolored skin. The skin may turn blue, purple, or yellow. Follow these instructions at home: Managing pain, stiffness, and swelling You may use RICE. This stands for: Resting. Icing. Compression, or putting pressure on the injured area. Elevating, or raising the injured area. To follow this method, do these actions: Rest the injured area. If told, put ice on the injured area. To do this: Put ice in a plastic bag. Place a towel between your skin and the bag. Leave the ice on for 20 minutes, 2-3 times per day. If your skin turns bright red, take off the ice right away to prevent skin damage. The risk of skin damage is higher if you cannot feel pain, heat, or cold. If told, apply compression on the injured area using an elastic bandage. Make sure the bandage is not too tight. If the area tingles or has a loss of feeling (numbness), remove it and put it back on as told by your doctor. If possible, elevate the injured area above the level of your heart while you are sitting or lying down.  General instructions Take over-the-counter and prescription medicines only as told by your doctor. Keep all follow-up visits. Your doctor may want to see how your contusion is healing with treatment. Contact a doctor if: Your symptoms do not get better after several days of treatment. Your symptoms get worse. You have trouble moving the injured area. Get help right away if: You have very bad pain. You have a loss of feeling (numbness) in a hand or foot. Your hand or foot turns pale or cold. This information is not intended to replace advice given to you by your health care provider. Make sure you discuss any questions you have with your health care provider. Document Revised: 09/09/2021 Document Reviewed: 09/09/2021 Elsevier Patient Education  2024  [employer].

## 2022-09-29 ENCOUNTER — Other Ambulatory Visit (HOSPITAL_COMMUNITY): Payer: Self-pay

## 2022-10-02 ENCOUNTER — Telehealth (INDEPENDENT_AMBULATORY_CARE_PROVIDER_SITE_OTHER): Payer: Federal, State, Local not specified - PPO | Admitting: Emergency Medicine

## 2022-10-02 ENCOUNTER — Encounter: Payer: Self-pay | Admitting: Emergency Medicine

## 2022-10-02 DIAGNOSIS — N6452 Nipple discharge: Secondary | ICD-10-CM

## 2022-10-02 DIAGNOSIS — Z1239 Encounter for other screening for malignant neoplasm of breast: Secondary | ICD-10-CM | POA: Insufficient documentation

## 2022-10-02 DIAGNOSIS — R928 Other abnormal and inconclusive findings on diagnostic imaging of breast: Secondary | ICD-10-CM | POA: Insufficient documentation

## 2022-10-02 NOTE — Assessment & Plan Note (Signed)
Abnormal diagnostic mammogram last December History of bilateral breast implants Needs bilateral breast MRI Reordered it today

## 2022-10-02 NOTE — Progress Notes (Signed)
Telemedicine Encounter- SOAP NOTE Established Patient MyChart video encounter Patient: Home  Provider: Office   Patient present only  This video encounter was conducted with the patient's (or proxy's) verbal consent via video telecommunications: yes/no: Yes Patient was instructed to have this encounter in a suitably private space; and to only have persons present to whom they give permission to participate. In addition, patient identity was confirmed by use of name plus two identifiers (DOB and address).  I discussed the limitations, risks, security and privacy concerns of performing an evaluation and management service by telephone and the availability of in person appointments. I also discussed with the patient that there may be a patient responsible charge related to this service. The patient expressed understanding and agreed to proceed.    Chief complaint: Persistent bilateral nipple discharge  Subjective  Danielle Kirby is a 77 y.o. female established patient. Telephone visit today complaining of persistent bilateral nipple discharge Mammogram and ultrasound last December showed abnormalities.  Patient has bilateral breast implants.  Radiologist recommended MRI of breasts which was ordered by this office but has not been scheduled yet. No other complaints or medical concerns today.  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     Multiple Vitamin (MULTIVITAMIN WITH MINERALS) TABS tablet Take 1 tablet by mouth daily.     rosuvastatin (CRESTOR) 10 MG tablet Take 1 tablet (10 mg total) by mouth daily. (Patient not taking: Reported on 09/08/2022) 90 tablet 3   valsartan-hydrochlorothiazide (DIOVAN-HCT) 160-12.5 MG tablet Take 1 tablet by mouth daily. (Patient not taking: Reported on 09/08/2022) 90 tablet 3   No current facility-administered medications for this visit.    Allergies  Allergen Reactions   Penicillins     REACTION: hives    Social History   Socioeconomic History   Marital status: Significant Other    Spouse name: Not on file   Number of children: Not on file   Years of education: Not on file   Highest education level: Not on file  Occupational History   Not on file  Tobacco Use   Smoking status: Never   Smokeless tobacco: Never  Substance and Sexual Activity   Alcohol use: No    Alcohol/week: 0.0 standard drinks of alcohol   Drug use: No   Sexual activity: Yes    Birth control/protection: None  Other Topics Concern   Not on file  Social History Narrative   Not on file   Social Determinants of Health   Financial Resource Strain: Not on file  Food Insecurity: Not on file  Transportation Needs: Not on file  Physical Activity: Not on file  Stress: Not on file  Social Connections: Not on file  Intimate Partner Violence: Not on file    Review of Systems  Constitutional: Negative.  Negative for chills and  fever.  HENT: Negative.  Negative for congestion.   Respiratory: Negative.  Negative for cough.   Cardiovascular: Negative.  Negative for chest pain and palpitations.  Gastrointestinal:  Negative for abdominal pain, nausea and vomiting.  Skin: Negative.  Negative for rash.       Bilateral nipple discharge right more than left  Neurological: Negative.  Negative for dizziness and headaches.  All other systems reviewed and are negative.   Objective   Alert and oriented x 3 in no apparent respiratory distress Vitals as reported by the patient: There were no vitals filed for this visit.  Problem List Items Addressed This Visit       Other   Bilateral nipple discharge - Primary    Abnormal diagnostic mammogram last December History of bilateral breast implants Needs bilateral breast MRI Reordered it today      Abnormal mammogram of both breasts    Needs follow-up with bilateral breast MRI      Encounter for screening for malignant neoplasm of breast   Relevant Orders   MR BREAST BILATERAL W WO CONTRAST INC CAD     I discussed the assessment and treatment plan with the patient. The patient was provided an opportunity to ask questions and all were answered. The patient agreed with the plan and demonstrated an understanding of the instructions.   The patient was advised to call back or seek an in-person evaluation if the symptoms worsen or if the condition fails to improve as anticipated.   Georgina Quint, MD  Primary [facility] at [facility]

## 2022-10-02 NOTE — Assessment & Plan Note (Signed)
Needs follow-up with bilateral breast MRI

## 2022-11-03 ENCOUNTER — Telehealth: Payer: Self-pay | Admitting: Emergency Medicine

## 2022-11-03 NOTE — Telephone Encounter (Signed)
Patient wants her breast xray done before September - please see if it can be rescheduled somewhere else sooner.

## 2022-11-03 NOTE — Telephone Encounter (Signed)
No need to be stat but it should be done as soon as possible.  Thanks.

## 2022-11-04 ENCOUNTER — Ambulatory Visit
Admission: RE | Admit: 2022-11-04 | Discharge: 2022-11-04 | Disposition: A | Discharge status: Home / Self Care | Payer: Federal, State, Local not specified - PPO | Source: Ambulatory Visit | Attending: Emergency Medicine | Admitting: Emergency Medicine

## 2022-11-04 DIAGNOSIS — Z1239 Encounter for other screening for malignant neoplasm of breast: Secondary | ICD-10-CM

## 2022-11-04 MED ORDER — GADOPICLENOL 0.5 MMOL/ML IV SOLN
9.0000 mL | Freq: Once | INTRAVENOUS | Status: AC | PRN
  Administered 2022-11-04: 9 mL via INTRAVENOUS

## 2022-11-04 NOTE — Telephone Encounter (Signed)
Patient had MRI of breast done today.

## 2022-11-04 NOTE — Telephone Encounter (Signed)
Thank you :)

## 2022-11-17 ENCOUNTER — Other Ambulatory Visit: Payer: Federal, State, Local not specified - PPO

## 2022-11-17 NOTE — Progress Notes (Signed)
Put in another referral please.  Thanks.

## 2022-11-18 ENCOUNTER — Other Ambulatory Visit: Payer: Self-pay | Admitting: *Deleted

## 2022-11-18 DIAGNOSIS — N6452 Nipple discharge: Secondary | ICD-10-CM

## 2022-12-10 ENCOUNTER — Other Ambulatory Visit: Payer: Federal, State, Local not specified - PPO

## 2023-09-02 ENCOUNTER — Telehealth: Payer: Self-pay | Admitting: Emergency Medicine

## 2023-09-02 NOTE — Telephone Encounter (Unsigned)
 Copied from CRM [id number]. Topic: Clinical - Lab/Test Results >> Sep 02, 2023 12:14 PM Danielle Kirby wrote: Reason for CRM: Patient is calling because she received a bill for the previous breast MRI, she is also experiencing the issue again that she previously had with the leakage. She is scheduled for a visit with Dr. Vedia Geralds but is requesting a call to go over previous results from the MRI on 11/04/22 she would also like a physical copy of the results mailed to her home address.

## 2023-09-03 ENCOUNTER — Encounter: Payer: Self-pay | Admitting: Emergency Medicine

## 2023-09-03 ENCOUNTER — Ambulatory Visit: Admitting: Emergency Medicine

## 2023-09-03 VITALS — BP 140/76 | HR 80 | Temp 98.1°F | Ht 67.0 in | Wt 185.0 lb

## 2023-09-03 DIAGNOSIS — E785 Hyperlipidemia, unspecified: Secondary | ICD-10-CM

## 2023-09-03 DIAGNOSIS — T8543XA Leakage of breast prosthesis and implant, initial encounter: Secondary | ICD-10-CM | POA: Insufficient documentation

## 2023-09-03 DIAGNOSIS — I10 Essential (primary) hypertension: Secondary | ICD-10-CM | POA: Diagnosis not present

## 2023-09-03 NOTE — Progress Notes (Signed)
 Danielle Kirby 78 y.o.   Chief Complaint  Patient presents with   Breast Problem    Patient here for breast discharge that has gotten worse. She states it is clear fluid, no pain.    HISTORY OF PRESENT ILLNESS: This is a 78 y.o. female complaining of bilateral clear breast discharge.  Denies pain. Breast MRI done July 2024 shows complications from ruptured implants.  General surgery evaluation was recommended but never done. No other complaints or medical concerns today.   HPI   Prior to Admission medications   Medication Sig Start Date End Date Taking? Authorizing Provider  Multiple Vitamin (MULTIVITAMIN WITH MINERALS) TABS tablet Take 1 tablet by mouth daily. 08/29/20  Yes Angiulli, Daniel J, PA-C  ROCKLATAN 0.02-0.005 % SOLN Apply 1 drop to eye at bedtime. 07/10/23  Yes [provider]  latanoprost (XALATAN) 0.005 % ophthalmic solution Place 1 drop into both eyes at bedtime. Patient not taking: Reported on 09/03/2023 09/21/21   [provider]  rosuvastatin  (CRESTOR ) 10 MG tablet Take 1 tablet (10 mg total) by mouth daily. Patient not taking: Reported on 09/08/2022 04/16/21   Elvira Hammersmith, MD  valsartan -hydrochlorothiazide  (DIOVAN -HCT) 160-12.5 MG tablet Take 1 tablet by mouth daily. 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   Macular degeneration of both eyes 02/02/2014   Degenerative arthritis of hip 05/23/2011    Past  Medical History:  Diagnosis Date   Allergy    Arthritis    Cataract    Heart murmur     Past Surgical History:  Procedure Laterality Date   AUGMENTATION MAMMAPLASTY Bilateral    pt states implants removed, but are still there   BREAST SURGERY     COSMETIC SURGERY     EYE SURGERY     TUBAL LIGATION      Social History   Socioeconomic History   Marital status: Significant Other    Spouse name: Not on file   Number of children: Not on file   Years of education: Not on file   Highest education level: Not on file  Occupational History   Not on file  Tobacco Use   Smoking status: Never   Smokeless tobacco: Never  Substance and Sexual Activity   Alcohol use: No    Alcohol/week: 0.0 standard drinks of alcohol   Drug use: No   Sexual activity: Yes    Birth control/protection: None  Other Topics Concern   Not on file  Social History Narrative   Not on file   Social Drivers of Health   Financial Resource Strain: Not on file  Food Insecurity: Not on file  Transportation Needs: Not on file  Physical Activity: Not on file  Stress: Not on file  Social Connections: Not on file  Intimate Partner Violence: Not on file    Family History  Problem Relation Age of Onset   Diabetes Mother    Hypertension  Father      Review of Systems  Constitutional: Negative.  Negative for chills and fever.  HENT: Negative.  Negative for congestion and sore throat.   Respiratory: Negative.  Negative for cough and shortness of breath.   Cardiovascular: Negative.  Negative for chest pain and palpitations.  Gastrointestinal:  Negative for abdominal pain, diarrhea, nausea and vomiting.  Skin: Negative.  Negative for rash.  Neurological: Negative.  Negative for dizziness and headaches.  All other systems reviewed and are negative.   Today's Vitals   09/03/23 0855  BP: (!) 140/76  Pulse: 80  Temp: 98.1 F (36.7 C)  TempSrc: Oral  SpO2: 98%  Weight: 185 lb (83.9 kg)  Height: 5\' 7"   (1.702 m)   Body mass index is 28.98 kg/m.   Physical Exam Vitals reviewed.  Constitutional:      Appearance: Normal appearance.  HENT:     Head: Normocephalic.  Eyes:     Extraocular Movements: Extraocular movements intact.  Cardiovascular:     Rate and Rhythm: Normal rate.  Pulmonary:     Effort: Pulmonary effort is normal.  Skin:    General: Skin is warm and dry.  Neurological:     Mental Status: She is alert and oriented to person, place, and time.  Psychiatric:        Mood and Affect: Mood normal.        Behavior: Behavior normal.      ASSESSMENT & PLAN: A total of 42 minutes was spent with the patient and counseling/coordination of care regarding preparing for this visit, review of most recent office visit notes, review of multiple chronic medical conditions and their management, diagnosis of rupture breast implants and need for surgical evaluation, review of all medications, review of most recent bloodwork results, review of health maintenance items, prognosis, documentation, and need for follow up.   Problem List Items Addressed This Visit       Cardiovascular and Mediastinum   Essential hypertension (Chronic)   BP Readings from Last 3 Encounters:  09/03/23 (!) 140/76  09/08/22 128/88  01/21/22 128/76  Well-controlled hypertension. Continue valsartan  hydrochlorothiazide  160-12.5 mg daily.         Other   Dyslipidemia   Stable.  Diet and nutrition discussed. Continue rosuvastatin  10 mg daily.      Ruptured silicone breast implant - Primary   Clinically stable.  No red flag signs or symptoms. Breast MRI from July 2024 report reviewed with patient Does not show malignancy but shows complications from ruptured breast implants.  Needs surgical evaluation.  Referral placed today.      Relevant Orders   Ambulatory referral to Plastic Surgery   Patient Instructions  Breast MRI from July 2024 shows complications from breast implant rupture. You need  evaluation by plastic surgeon. Referral was placed today.  Expect a call to schedule an appointment.  Health Maintenance After Age 25 After age 44, you are at a higher risk for certain long-term diseases and infections as well as injuries from falls. Falls are a major cause of broken bones and head injuries in people who are older than age 105. Getting regular preventive care can help to keep you healthy and well. Preventive care includes getting regular testing and making lifestyle changes as recommended by your health care provider. Talk with your health care provider about: Which screenings and tests you should have. A screening is a test that checks for a disease when you have no symptoms. A diet and exercise  plan that is right for you. What should I know about screenings and tests to prevent falls? Screening and testing are the best ways to find a health problem early. Early diagnosis and treatment give you the best chance of managing medical conditions that are common after age 17. Certain conditions and lifestyle choices may make you more likely to have a fall. Your health care provider may recommend: Regular vision checks. Poor vision and conditions such as cataracts can make you more likely to have a fall. If you wear glasses, make sure to get your prescription updated if your vision changes. Medicine review. Work with your health care provider to regularly review all of the medicines you are taking, including over-the-counter medicines. Ask your health care provider about any side effects that may make you more likely to have a fall. Tell your health care provider if any medicines that you take make you feel dizzy or sleepy. Strength and balance checks. Your health care provider may recommend certain tests to check your strength and balance while standing, walking, or changing positions. Foot health exam. Foot pain and numbness, as well as not wearing proper footwear, can make you more likely to  have a fall. Screenings, including: Osteoporosis screening. Osteoporosis is a condition that causes the bones to get weaker and break more easily. Blood pressure screening. Blood pressure changes and medicines to control blood pressure can make you feel dizzy. Depression screening. You may be more likely to have a fall if you have a fear of falling, feel depressed, or feel unable to do activities that you used to do. Alcohol use screening. Using too much alcohol can affect your balance and may make you more likely to have a fall. Follow these instructions at home: Lifestyle Do not drink alcohol if: Your health care provider tells you not to drink. If you drink alcohol: Limit how much you have to: 0-1 drink a day for women. 0-2 drinks a day for men. Know how much alcohol is in your drink. In the [place] one drink equals one 12 oz bottle of beer (355 mL), one 5 oz glass of wine (148 mL), or one 1 oz glass of hard liquor (44 mL). Do not use any products that contain nicotine or tobacco. These products include cigarettes, chewing tobacco, and vaping devices, such as e-cigarettes. If you need help quitting, ask your health care provider. Activity  Follow a regular exercise program to stay fit. This will help you maintain your balance. Ask your health care provider what types of exercise are appropriate for you. If you need a cane or walker, use it as recommended by your health care provider. Wear supportive shoes that have nonskid soles. Safety  Remove any tripping hazards, such as rugs, cords, and clutter. Install safety equipment such as grab bars in bathrooms and safety rails on stairs. Keep rooms and walkways well-lit. General instructions Talk with your health care provider about your risks for falling. Tell your health care provider if: You fall. Be sure to tell your health care provider about all falls, even ones that seem minor. You feel dizzy, tiredness (fatigue), or  off-balance. Take over-the-counter and prescription medicines only as told by your health care provider. These include supplements. Eat a healthy diet and maintain a healthy weight. A healthy diet includes low-fat dairy products, low-fat (lean) meats, and fiber from whole grains, beans, and lots of fruits and vegetables. Stay current with your vaccines. Schedule regular health, dental, and eye exams. Summary Having  a healthy lifestyle and getting preventive care can help to protect your health and wellness after age 24. Screening and testing are the best way to find a health problem early and help you avoid having a fall. Early diagnosis and treatment give you the best chance for managing medical conditions that are more common for people who are older than age 5. Falls are a major cause of broken bones and head injuries in people who are older than age 77. Take precautions to prevent a fall at home. Work with your health care provider to learn what changes you can make to improve your health and wellness and to prevent falls. This information is not intended to replace advice given to you by your health care provider. Make sure you discuss any questions you have with your health care provider. Document Revised: 08/13/2020 Document Reviewed: 08/13/2020 Elsevier Patient Education  2024 [employer].   Maryagnes Small, MD [facility]

## 2023-09-03 NOTE — Telephone Encounter (Signed)
Patient had OV today. 

## 2023-09-03 NOTE — Patient Instructions (Signed)
 Breast MRI from July 2024 shows complications from breast implant rupture. You need evaluation by plastic surgeon. Referral was placed today.  Expect a call to schedule an appointment.  Health Maintenance After Age 78 After age 73, you are at a higher risk for certain long-term diseases and infections as well as injuries from falls. Falls are a major cause of broken bones and head injuries in people who are older than age 29. Getting regular preventive care can help to keep you healthy and well. Preventive care includes getting regular testing and making lifestyle changes as recommended by your health care provider. Talk with your health care provider about: Which screenings and tests you should have. A screening is a test that checks for a disease when you have no symptoms. A diet and exercise plan that is right for you. What should I know about screenings and tests to prevent falls? Screening and testing are the best ways to find a health problem early. Early diagnosis and treatment give you the best chance of managing medical conditions that are common after age 26. Certain conditions and lifestyle choices may make you more likely to have a fall. Your health care provider may recommend: Regular vision checks. Poor vision and conditions such as cataracts can make you more likely to have a fall. If you wear glasses, make sure to get your prescription updated if your vision changes. Medicine review. Work with your health care provider to regularly review all of the medicines you are taking, including over-the-counter medicines. Ask your health care provider about any side effects that may make you more likely to have a fall. Tell your health care provider if any medicines that you take make you feel dizzy or sleepy. Strength and balance checks. Your health care provider may recommend certain tests to check your strength and balance while standing, walking, or changing positions. Foot health exam. Foot  pain and numbness, as well as not wearing proper footwear, can make you more likely to have a fall. Screenings, including: Osteoporosis screening. Osteoporosis is a condition that causes the bones to get weaker and break more easily. Blood pressure screening. Blood pressure changes and medicines to control blood pressure can make you feel dizzy. Depression screening. You may be more likely to have a fall if you have a fear of falling, feel depressed, or feel unable to do activities that you used to do. Alcohol use screening. Using too much alcohol can affect your balance and may make you more likely to have a fall. Follow these instructions at home: Lifestyle Do not drink alcohol if: Your health care provider tells you not to drink. If you drink alcohol: Limit how much you have to: 0-1 drink a day for women. 0-2 drinks a day for men. Know how much alcohol is in your drink. In the [place] one drink equals one 12 oz bottle of beer (355 mL), one 5 oz glass of wine (148 mL), or one 1 oz glass of hard liquor (44 mL). Do not use any products that contain nicotine or tobacco. These products include cigarettes, chewing tobacco, and vaping devices, such as e-cigarettes. If you need help quitting, ask your health care provider. Activity  Follow a regular exercise program to stay fit. This will help you maintain your balance. Ask your health care provider what types of exercise are appropriate for you. If you need a cane or walker, use it as recommended by your health care provider. Wear supportive shoes that have nonskid soles.  Safety  Remove any tripping hazards, such as rugs, cords, and clutter. Install safety equipment such as grab bars in bathrooms and safety rails on stairs. Keep rooms and walkways well-lit. General instructions Talk with your health care provider about your risks for falling. Tell your health care provider if: You fall. Be sure to tell your health care provider about all  falls, even ones that seem minor. You feel dizzy, tiredness (fatigue), or off-balance. Take over-the-counter and prescription medicines only as told by your health care provider. These include supplements. Eat a healthy diet and maintain a healthy weight. A healthy diet includes low-fat dairy products, low-fat (lean) meats, and fiber from whole grains, beans, and lots of fruits and vegetables. Stay current with your vaccines. Schedule regular health, dental, and eye exams. Summary Having a healthy lifestyle and getting preventive care can help to protect your health and wellness after age 31. Screening and testing are the best way to find a health problem early and help you avoid having a fall. Early diagnosis and treatment give you the best chance for managing medical conditions that are more common for people who are older than age 55. Falls are a major cause of broken bones and head injuries in people who are older than age 41. Take precautions to prevent a fall at home. Work with your health care provider to learn what changes you can make to improve your health and wellness and to prevent falls. This information is not intended to replace advice given to you by your health care provider. Make sure you discuss any questions you have with your health care provider. Document Revised: 08/13/2020 Document Reviewed: 08/13/2020 Elsevier Patient Education  2024 [employer].

## 2023-09-03 NOTE — Assessment & Plan Note (Signed)
 BP Readings from Last 3 Encounters:  09/03/23 (!) 140/76  09/08/22 128/88  01/21/22 128/76  Well-controlled hypertension. Continue valsartan  hydrochlorothiazide  160-12.5 mg daily.

## 2023-09-03 NOTE — Assessment & Plan Note (Signed)
 Stable.  Diet and nutrition discussed.  Continue rosuvastatin 10 mg daily.

## 2023-09-03 NOTE — Assessment & Plan Note (Signed)
 Clinically stable.  No red flag signs or symptoms. Breast MRI from July 2024 report reviewed with patient Does not show malignancy but shows complications from ruptured breast implants.  Needs surgical evaluation.  Referral placed today.

## 2023-09-04 NOTE — Telephone Encounter (Signed)
 Copied from CRM [id number]. Topic: General - Other >> Sep 04, 2023  9:25 AM Martinique E wrote: Reason for CRM: Patient was seen yesterday and stated she was supposed to receive a call, patient did not know what this was in regards too but would like a callback today. Home phone on file is the best number, but OK to leave a VM on mobile.

## 2023-09-04 NOTE — Telephone Encounter (Signed)
 Informed patient she will receive a phone call within a week or 2 to get something scheduled

## 2023-09-16 ENCOUNTER — Telehealth: Payer: Self-pay | Admitting: Emergency Medicine

## 2023-09-16 NOTE — Telephone Encounter (Signed)
 Copied from CRM [id number]. Topic: Referral - Question >> Sep 16, 2023  3:25 PM Baldo Levan wrote: Reason for CRM: Patient is calling in regarding questions about the plastic surgery referral. Please contact the patient to help with these

## 2023-09-17 NOTE — Telephone Encounter (Signed)
 Spoke with patient and she is going to call the plastic surgery, she states she does not find this appt necessary because she says her implants are already removed.Danielle Kirby advised her to still make an appointment to get a second opinion. She understood and will

## 2023-11-03 ENCOUNTER — Ambulatory Visit: Admitting: Emergency Medicine

## 2023-11-03 ENCOUNTER — Telehealth: Payer: Self-pay

## 2023-11-03 ENCOUNTER — Ambulatory Visit: Payer: Self-pay | Admitting: Emergency Medicine

## 2023-11-03 ENCOUNTER — Ambulatory Visit (INDEPENDENT_AMBULATORY_CARE_PROVIDER_SITE_OTHER)

## 2023-11-03 ENCOUNTER — Encounter: Payer: Self-pay | Admitting: Emergency Medicine

## 2023-11-03 VITALS — BP 142/90 | HR 77 | Temp 98.5°F | Ht 67.0 in | Wt 187.0 lb

## 2023-11-03 DIAGNOSIS — S8002XA Contusion of left knee, initial encounter: Secondary | ICD-10-CM | POA: Insufficient documentation

## 2023-11-03 DIAGNOSIS — W19XXXA Unspecified fall, initial encounter: Secondary | ICD-10-CM | POA: Diagnosis not present

## 2023-11-03 DIAGNOSIS — I10 Essential (primary) hypertension: Secondary | ICD-10-CM

## 2023-11-03 DIAGNOSIS — T07XXXA Unspecified multiple injuries, initial encounter: Secondary | ICD-10-CM | POA: Insufficient documentation

## 2023-11-03 NOTE — Patient Instructions (Signed)
Contusion A contusion is a deep bruise. This is a result of an injury that causes bleeding under the skin. Symptoms of bruising include pain, swelling, and discolored skin. The skin may turn blue, purple, or yellow. Follow these instructions at home: Managing pain, stiffness, and swelling You may use RICE. This stands for: Resting. Icing. Compression, or putting pressure on the injured area. Elevating, or raising the injured area. To follow this method, do these actions: Rest the injured area. If told, put ice on the injured area. To do this: Put ice in a plastic bag. Place a towel between your skin and the bag. Leave the ice on for 20 minutes, 2-3 times per day. If your skin turns bright red, take off the ice right away to prevent skin damage. The risk of skin damage is higher if you cannot feel pain, heat, or cold. If told, apply compression on the injured area using an elastic bandage. Make sure the bandage is not too tight. If the area tingles or has a loss of feeling (numbness), remove it and put it back on as told by your doctor. If possible, elevate the injured area above the level of your heart while you are sitting or lying down.  General instructions Take over-the-counter and prescription medicines only as told by your doctor. Keep all follow-up visits. Your doctor may want to see how your contusion is healing with treatment. Contact a doctor if: Your symptoms do not get better after several days of treatment. Your symptoms get worse. You have trouble moving the injured area. Get help right away if: You have very bad pain. You have a loss of feeling (numbness) in a hand or foot. Your hand or foot turns pale or cold. This information is not intended to replace advice given to you by your health care provider. Make sure you discuss any questions you have with your health care provider. Document Revised: 09/09/2021 Document Reviewed: 09/09/2021 Elsevier Patient Education  2024  [employer].

## 2023-11-03 NOTE — Assessment & Plan Note (Signed)
 Along with abrasion.  Wound care instructions given Recommend x-ray today.  Will review images when available. Doubt fracture. Advised to continue cold compresses for 24 hours Pain management discussed

## 2023-11-03 NOTE — Assessment & Plan Note (Signed)
 Danielle Kirby

## 2023-11-03 NOTE — Assessment & Plan Note (Signed)
 Mild.  No complications.  No concerns. Pain management discussed

## 2023-11-03 NOTE — Telephone Encounter (Signed)
 Copied from CRM [id number]. Topic: Clinical - Medical Advice >> Nov 03, 2023  4:52 PM Sophia H wrote: Reason for CRM:   Patient states she was seen earlier today and forgot to mention that she did fall and has been seeing blue spots, wants to know if she should be concerned. ** Not currently happening, comes and goes. Please advise # [phone]

## 2023-11-03 NOTE — Progress Notes (Signed)
 Danielle Kirby 78 y.o.   Chief Complaint  Patient presents with   Fall    Patient had a fall yesterday. Patient has a swollen left knee and a bruise on her left face. She fell at the [place] parking lot. She is not taking anything for the pain     HISTORY OF PRESENT ILLNESS: This is a 78 y.o. female tripped and fell yesterday morning Sustained left knee injury.  No syncopal episode. Hit also left side of face.  No open wounds. No other complaints or medical concerns today.  Fall Pertinent negatives include no abdominal pain, fever, headaches, hematuria, nausea or vomiting.     Prior to Admission medications   Medication Sig Start Date End Date Taking? Authorizing Provider  Multiple Vitamin (MULTIVITAMIN WITH MINERALS) TABS tablet Take 1 tablet by mouth daily. 08/29/20  Yes Angiulli, Daniel J, PA-C  ROCKLATAN 0.02-0.005 % SOLN Apply 1 drop to eye at bedtime. 07/10/23  Yes [provider]  latanoprost (XALATAN) 0.005 % ophthalmic solution Place 1 drop into both eyes at bedtime. Patient not taking: Reported on 11/03/2023 09/21/21   [provider]  rosuvastatin  (CRESTOR ) 10 MG tablet Take 1 tablet (10 mg total) by mouth daily. Patient not taking: Reported on 11/03/2023 04/16/21   [facility]cell Emil Schanz, MD  valsartan -hydrochlorothiazide  (DIOVAN -HCT) 160-12.5 MG tablet Take 1 tablet by mouth daily. 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   Degenerative  arthritis of hip 05/23/2011    Past Medical History:  Diagnosis Date   Allergy    Arthritis    Cataract    Heart murmur     Past Surgical History:  Procedure Laterality Date   AUGMENTATION MAMMAPLASTY Bilateral    pt states implants removed, but are still there   BREAST SURGERY     COSMETIC SURGERY     EYE SURGERY     TUBAL LIGATION      Social History   Socioeconomic History   Marital status: Significant Other    Spouse name: Not on file   Number of children: Not on file   Years of education: Not on file   Highest education level: Not on file  Occupational History   Not on file  Tobacco Use   Smoking status: Never   Smokeless tobacco: Never  Substance and Sexual Activity   Alcohol use: No    Alcohol/week: 0.0 standard drinks of alcohol   Drug use: No   Sexual activity: Yes    Birth control/protection: None  Other Topics Concern   Not on file  Social History Narrative   Not on file   Social Drivers of Health   Financial Resource Strain: Not on file  Food Insecurity: Not on file  Transportation Needs: Not on file  Physical Activity: Not on file  Stress: Not on file  Social Connections: Not on file  Intimate Partner Violence: Not on file    Family History  Problem Relation Age of Onset   Diabetes Mother    Hypertension Father      Review of Systems  Constitutional: Negative.  Negative for chills and fever.  HENT: Negative.  Negative for nosebleeds and sore throat.   Respiratory: Negative.  Negative for cough and shortness of breath.   Cardiovascular: Negative.  Negative for chest pain and palpitations.  Gastrointestinal:  Negative for abdominal pain, nausea and vomiting.  Genitourinary: Negative.  Negative for dysuria and hematuria.  Skin: Negative.  Negative for rash.  Neurological: Negative.  Negative for dizziness and headaches.  All other systems reviewed and are negative.   Today's Vitals   11/03/23 1259  BP: (!) 142/90  Pulse: 77   Temp: 98.5 F (36.9 C)  TempSrc: Oral  SpO2: 99%  Weight: 187 lb (84.8 kg)  Height: 5' 7 (1.702 m)   Body mass index is 29.29 kg/m.   Physical Exam Vitals reviewed.  Constitutional:      Appearance: Normal appearance.  HENT:     Head: Normocephalic.  Eyes:     Extraocular Movements: Extraocular movements intact.     Conjunctiva/sclera: Conjunctivae normal.     Pupils: Pupils are equal, round, and reactive to light.  Cardiovascular:     Rate and Rhythm: Normal rate and regular rhythm.     Pulses: Normal pulses.     Heart sounds: Normal heart sounds.  Pulmonary:     Effort: Pulmonary effort is normal.     Breath sounds: Normal breath sounds.  Musculoskeletal:     Cervical back: No tenderness.     Comments: Left knee: Full range of motion.  Positive abrasion with soft tissue swelling Right knee: Within normal limits  Lymphadenopathy:     Cervical: No cervical adenopathy.  Skin:    General: Skin is warm and dry.     Capillary Refill: Capillary refill takes less than 2 seconds.  Neurological:     General: No focal deficit present.     Mental Status: She is alert and oriented to person, place, and time.  Psychiatric:        Mood and Affect: Mood normal.        Behavior: Behavior normal.   DG Knee 1-2 Views Left Result Date: 11/03/2023 CLINICAL DATA:  Left knee acute injury EXAM: LEFT KNEE - 1-2 VIEW COMPARISON:  July 30, 2020 FINDINGS: No acute fracture or dislocation. Mild-to-moderate tricompartmental joint space loss, most notably in the patellofemoral and medial compartments. Chondrocalcinosis. Scattered peripheral vascular atherosclerosis. IMPRESSION: 1. No acute fracture or dislocation. 2. 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 Electronically Signed   By: Rogelia Myers M.D.   On: 11/03/2023 13:53      ASSESSMENT & PLAN: A total of 32 minutes was spent with the patient and counseling/coordination of care regarding preparing for this visit,  review of most recent office visit notes, review of all medications and chronic medical conditions, diagnosis of contusion management, pain management, review of x-ray images done today, prognosis, documentation, and need for follow-up as needed.  Problem List Items Addressed This Visit       Cardiovascular and Mediastinum   Essential hypertension (Chronic)            Other   Multiple contusions - Primary   Mild.  No complications.  No concerns. Pain management discussed      Contusion of left knee   Along with abrasion.  Wound care instructions given Recommend x-ray today.  Will review images when available.  Doubt fracture. Advised to continue cold compresses for 24 hours Pain management discussed      Relevant Orders   DG Knee 1-2 Views Left   Other Visit Diagnoses       Accidental fall, initial encounter          Patient Instructions  Contusion A contusion is a deep bruise. This is a result of an injury that causes bleeding under the skin. Symptoms of bruising include pain, swelling, and discolored skin. The skin may turn blue, purple, or yellow. Follow these instructions at home: Managing pain, stiffness, and swelling You may use RICE. This stands for: Resting. Icing. Compression, or putting pressure on the injured area. Elevating, or raising the injured area. To follow this method, do these actions: Rest the injured area. If told, put ice on the injured area. To do this: Put ice in a plastic bag. Place a towel between your skin and the bag. Leave the ice on for 20 minutes, 2-3 times per day. If your skin turns bright red, take off the ice right away to prevent skin damage. The risk of skin damage is higher if you cannot feel pain, heat, or cold. If told, apply compression on the injured area using an elastic bandage. Make sure the bandage is not too tight. If the area tingles or has a loss of feeling (numbness), remove it and put it back on as told by your  doctor. If possible, elevate the injured area above the level of your heart while you are sitting or lying down.  General instructions Take over-the-counter and prescription medicines only as told by your doctor. Keep all follow-up visits. Your doctor may want to see how your contusion is healing with treatment. Contact a doctor if: Your symptoms do not get better after several days of treatment. Your symptoms get worse. You have trouble moving the injured area. Get help right away if: You have very bad pain. You have a loss of feeling (numbness) in a hand or foot. Your hand or foot turns pale or cold. This information is not intended to replace advice given to you by your health care provider. Make sure you discuss any questions you have with your health care provider. Document Revised: 09/09/2021 Document Reviewed: 09/09/2021 Elsevier Patient Education  2024 Elsevier Inc.    Emil Schaumann, MD [facility]

## 2023-11-04 NOTE — Telephone Encounter (Signed)
 Recommend to monitor symptoms and contact the office if anything else develops or condition worsens over the next several days.

## 2023-11-06 NOTE — Telephone Encounter (Signed)
 LVM for patient to call back regarding her concerns of her fall.

## 2023-11-10 NOTE — Telephone Encounter (Signed)
 Tried returning patients call, called both numbers on file. No answer and unable to LVM for patient to call back

## 2024-02-28 ENCOUNTER — Other Ambulatory Visit: Payer: Self-pay

## 2024-02-28 ENCOUNTER — Emergency Department (HOSPITAL_COMMUNITY)
Admission: EM | Admit: 2024-02-28 | Discharge: 2024-02-28 | Discharge status: Against Medical Advice | Attending: Emergency Medicine | Admitting: Emergency Medicine

## 2024-02-28 ENCOUNTER — Encounter (HOSPITAL_COMMUNITY): Payer: Self-pay | Admitting: Pharmacy Technician

## 2024-02-28 DIAGNOSIS — R5383 Other fatigue: Secondary | ICD-10-CM | POA: Diagnosis present

## 2024-02-28 DIAGNOSIS — Z5321 Procedure and treatment not carried out due to patient leaving prior to being seen by health care provider: Secondary | ICD-10-CM | POA: Diagnosis not present

## 2024-02-28 LAB — CBC
HCT: 33.2 % — ABNORMAL LOW (ref 36.0–46.0)
Hemoglobin: 10.2 g/dL — ABNORMAL LOW (ref 12.0–15.0)
MCH: 26.6 pg (ref 26.0–34.0)
MCHC: 30.7 g/dL (ref 30.0–36.0)
MCV: 86.5 fL (ref 80.0–100.0)
Platelets: 261 K/uL (ref 150–400)
RBC: 3.84 MIL/uL — ABNORMAL LOW (ref 3.87–5.11)
RDW: 14.3 % (ref 11.5–15.5)
WBC: 9.4 K/uL (ref 4.0–10.5)
nRBC: 0 % (ref 0.0–0.2)

## 2024-02-28 LAB — COMPREHENSIVE METABOLIC PANEL WITH GFR
ALT: 35 U/L (ref 0–44)
AST: 34 U/L (ref 15–41)
Albumin: 3.2 g/dL — ABNORMAL LOW (ref 3.5–5.0)
Alkaline Phosphatase: 247 U/L — ABNORMAL HIGH (ref 38–126)
Anion gap: 12 (ref 5–15)
BUN: 12 mg/dL (ref 8–23)
CO2: 23 mmol/L (ref 22–32)
Calcium: 9.8 mg/dL (ref 8.9–10.3)
Chloride: 103 mmol/L (ref 98–111)
Creatinine, Ser: 0.81 mg/dL (ref 0.44–1.00)
GFR, Estimated: >60 mL/min (ref >60)
Glucose, Bld: 89 mg/dL (ref 70–99)
Potassium: 4.3 mmol/L (ref 3.5–5.1)
Sodium: 138 mmol/L (ref 135–145)
Total Bilirubin: 0.8 mg/dL (ref 0.0–1.2)
Total Protein: 7.4 g/dL (ref 6.5–8.1)

## 2024-02-28 LAB — CBG MONITORING, ED: Glucose-Capillary: 93 mg/dL (ref 70–99)

## 2024-02-28 NOTE — ED Notes (Signed)
 Pt did not answer in lobby x2. Moved otf.

## 2024-02-28 NOTE — ED Triage Notes (Signed)
 Patient bib husband for weakness for the past week. Patient denies symptoms of infection, denies dizziness, headache, nausea or vomiting. Husband reports that she is not moving around as well as she usually does. She uses a cane or walker at home. Husband says she has been holding on to the wall as well.

## 2024-02-28 NOTE — ED Notes (Signed)
 Rn called for patient twice no answer.

## 2024-02-28 NOTE — ED Provider Triage Note (Signed)
 Emergency Medicine Provider Triage Evaluation Note  Danielle Kirby , a 78 y.o. female  was evaluated in triage.  Pt complains of fatigue. Pt report she felt a bit weak and lazy today.  Husband urge her to come to be evaluated.  Pt denies headache, cp, sob, abd pain, back pain, n/v/d, dysuria, focal numbness or focal weakness. Felt fine yesterday.  No recent sickness  Review of Systems  Positive: As above Negative: As above  Physical Exam  BP (!) 136/90   Pulse 97   Temp 98.2 F (36.8 C)   Resp 18   SpO2 100%  Gen:   Awake, no distress   Resp:  Normal effort  MSK:   Moves extremities without difficulty  Other:    Medical Decision Making  Medically screening exam initiated at 12:45 PM.  Appropriate orders placed.  Danielle Kirby was informed that the remainder of the evaluation will be completed by another provider, this initial triage assessment does not replace that evaluation, and the importance of remaining in the ED until their evaluation is complete.     Danielle Colon, PA-C 02/28/24 1246

## 2024-03-01 ENCOUNTER — Emergency Department (HOSPITAL_COMMUNITY)

## 2024-03-01 ENCOUNTER — Emergency Department (HOSPITAL_COMMUNITY): Admission: EM | Admit: 2024-03-01 | Discharge: 2024-03-01 | Disposition: A | Discharge status: Home / Self Care

## 2024-03-01 ENCOUNTER — Other Ambulatory Visit: Payer: Self-pay

## 2024-03-01 DIAGNOSIS — M25561 Pain in right knee: Secondary | ICD-10-CM | POA: Diagnosis present

## 2024-03-01 DIAGNOSIS — R531 Weakness: Secondary | ICD-10-CM | POA: Diagnosis not present

## 2024-03-01 LAB — URINALYSIS, ROUTINE W REFLEX MICROSCOPIC
Bilirubin Urine: NEGATIVE
Glucose, UA: NEGATIVE mg/dL
Ketones, ur: 20 mg/dL — AB
Leukocytes,Ua: NEGATIVE
Nitrite: NEGATIVE
Protein, ur: 30 mg/dL — AB
Specific Gravity, Urine: 1.017 (ref 1.005–1.030)
pH: 6 (ref 5.0–8.0)

## 2024-03-01 LAB — COMPREHENSIVE METABOLIC PANEL WITH GFR
ALT: 27 U/L (ref 0–44)
AST: 26 U/L (ref 15–41)
Albumin: 3 g/dL — ABNORMAL LOW (ref 3.5–5.0)
Alkaline Phosphatase: 214 U/L — ABNORMAL HIGH (ref 38–126)
Anion gap: 11 (ref 5–15)
BUN: 6 mg/dL — ABNORMAL LOW (ref 8–23)
CO2: 22 mmol/L (ref 22–32)
Calcium: 10 mg/dL (ref 8.9–10.3)
Chloride: 100 mmol/L (ref 98–111)
Creatinine, Ser: 0.69 mg/dL (ref 0.44–1.00)
GFR, Estimated: >60 mL/min (ref >60)
Glucose, Bld: 104 mg/dL — ABNORMAL HIGH (ref 70–99)
Potassium: 3.6 mmol/L (ref 3.5–5.1)
Sodium: 133 mmol/L — ABNORMAL LOW (ref 135–145)
Total Bilirubin: 0.8 mg/dL (ref 0.0–1.2)
Total Protein: 7.4 g/dL (ref 6.5–8.1)

## 2024-03-01 LAB — CBC
HCT: 33.5 % — ABNORMAL LOW (ref 36.0–46.0)
Hemoglobin: 10.7 g/dL — ABNORMAL LOW (ref 12.0–15.0)
MCH: 27.1 pg (ref 26.0–34.0)
MCHC: 31.9 g/dL (ref 30.0–36.0)
MCV: 84.8 fL (ref 80.0–100.0)
Platelets: 269 K/uL (ref 150–400)
RBC: 3.95 MIL/uL (ref 3.87–5.11)
RDW: 13.9 % (ref 11.5–15.5)
WBC: 10.2 K/uL (ref 4.0–10.5)
nRBC: 0 % (ref 0.0–0.2)

## 2024-03-01 LAB — LIPASE, BLOOD: Lipase: 26 U/L (ref 11–51)

## 2024-03-01 MED ORDER — LACTATED RINGERS IV BOLUS
1000.0000 mL | Freq: Once | INTRAVENOUS | Status: AC
  Administered 2024-03-01: 1000 mL via INTRAVENOUS

## 2024-03-01 NOTE — TOC CM/SW Note (Signed)
 TOC consult for [facility] received. Met with patient and family at bedside to discuss. CM explained HH services and patient is agreeable. Area agency list given. Patient has chosen either [facility] or [facility]. Referrals sent via Epic to both agencies, awaiting response.   HH order requested from MD. RN provided FWW from floor stock.   Merilee Batty, MSN, RN Case Management [phone]

## 2024-03-01 NOTE — ED Provider Notes (Signed)
 I received the patient in signout.  Urinalysis is pending at the time of discharge.  The patient is here for generalized weakness and fall.  The patient is feeling much better on reassessment here and was ambulatory here while giving her urine sample and is feeling much better.  Discussed observation admission for SNF placement and PT eval versus going home.  Patient would like to go home.  Social work consult placed for possible home health.  The patient is discharged with return precautions.  Physical Exam  BP (!) 169/95   Pulse 95   Temp 98.7 F (37.1 C) (Oral)   Resp 14   Ht 5' 7 (1.702 m)   Wt 84.8 kg   SpO2 100%   BMI 29.28 kg/m   Physical Exam  Procedures  Procedures  ED Course / MDM    Medical Decision Making Amount and/or Complexity of Data Reviewed Labs: ordered. Radiology: ordered. ECG/medicine tests: ordered.          Danielle Prentice SAUNDERS, MD 03/01/24 [phone]

## 2024-03-01 NOTE — ED Provider Triage Note (Signed)
 Emergency Medicine Provider Triage Evaluation Note  Danielle Kirby , a 78 y.o. female  was evaluated in triage.  Pt complains of weakness.  EMS was called out due to bilateral leg weakness/ambulatory dysfunction.  Patient denies trauma or injury.  Significant other did note decline in mental status.  Patient is not quite sure why she is here.  Review of Systems  Positive: weakness Negative: Headache, chest pain   Physical Exam  BP 104/66   Pulse 88   Temp 98.4 F (36.9 C) (Oral)   Resp 15   Ht 5' 7 (1.702 m)   Wt 84.8 kg   SpO2 100%   BMI 29.28 kg/m  Gen:   Awake, no distress   Resp:  Normal effort MSK:   Moves extremities without difficulty. Right knee pain. No erythema or warmth  Other:  Global weakness in lower extremities, but no focal neuro deficits.   Medical Decision Making  Medically screening exam initiated at 11:36 AM.  Appropriate orders placed.  Danielle Kirby was informed that the remainder of the evaluation will be completed by another provider, this initial triage assessment does not replace that evaluation, and the importance of remaining in the ED until their evaluation is complete.  Presented for ambulatory dysfunction/generalized weakness.  Blood pressure slightly soft.  Screening labs ordered.   Neysa Caron PARAS, DO 03/01/24 1136

## 2024-03-01 NOTE — ED Triage Notes (Signed)
 Patient arrives via [facility] for bilat leg weakness. No trauma or injury endorsed. Patient's significant other endorses decline in mental status.   166/86 HR 82 99 on room air

## 2024-03-01 NOTE — Discharge Instructions (Signed)
 Your workup today was reassuring.  Please follow-up with your doctor and return to the ER for worsening symptoms.

## 2024-03-01 NOTE — ED Notes (Signed)
 Patient transported to X-ray

## 2024-03-01 NOTE — ED Provider Notes (Signed)
 [place] EMERGENCY DEPARTMENT AT [facility] Provider Note   CSN: [id number] Arrival date & time: 03/01/24  1102     Patient presents with: Weakness   Danielle Kirby is a 78 y.o. female.   78 year old female presents emergency department for weakness and ambulatory dysfunction.  Ambulates with a cane intermittently, seemingly using it more the past 4 to 5 days.  Having difficulty getting up stairs at home.  Patient complaining of some right knee pain, but no other complaints currently and states she feels her normal state of health.   Weakness      Prior to Admission medications   Medication Sig Start Date End Date Taking? Authorizing Provider  Multiple Vitamin (MULTIVITAMIN WITH MINERALS) TABS tablet Take 1 tablet by mouth daily. 08/29/20  Yes Angiulli, Toribio PARAS, PA-C  ROCKLATAN 0.02-0.005 % SOLN Place 1 drop into both eyes at bedtime. 07/10/23  Yes [provider]    Allergies: Penicillins    Review of Systems  Neurological:  Positive for weakness.    Updated Vital Signs BP (!) 187/103 (BP Location: Left Arm)   Pulse (!) 101   Temp 98.7 F (37.1 C) (Oral)   Resp 16   Ht 5' 7 (1.702 m)   Wt 84.8 kg   SpO2 100%   BMI 29.28 kg/m   Physical Exam Vitals and nursing note reviewed.  Constitutional:      General: She is not in acute distress.    Appearance: She is not toxic-appearing.  HENT:     Head: Normocephalic.     Nose: Nose normal.     Mouth/Throat:     Mouth: Mucous membranes are moist.  Eyes:     Conjunctiva/sclera: Conjunctivae normal.  Cardiovascular:     Rate and Rhythm: Normal rate and regular rhythm.  Pulmonary:     Effort: Pulmonary effort is normal.     Breath sounds: Normal breath sounds.  Abdominal:     General: Abdomen is flat. There is no distension.     Palpations: Abdomen is soft.     Tenderness: There is no abdominal tenderness. There is no guarding or rebound.  Musculoskeletal:     Comments: Normal sensation in  lower extremities.  Equal pulses.  Minor global weakness. Some tenderness to her right knee, but no erythema warmth or swelling.  Soft compartments.  No midline spinal tenderness.  Skin:    General: Skin is warm and dry.     Capillary Refill: Capillary refill takes less than 2 seconds.  Neurological:     General: No focal deficit present.     Mental Status: She is alert and oriented to person, place, and time.     Comments: 4/5 plantarflexion dorsiflexion, and hip extension, barely able to raise either leg up off the bed straight leg.  Psychiatric:        Mood and Affect: Mood normal.        Behavior: Behavior normal.     (all labs ordered are listed, but only abnormal results are displayed) Labs Reviewed  CBC - Abnormal; Notable for the following components:      Result Value   Hemoglobin 10.7 (*)    HCT 33.5 (*)    All other components within normal limits  COMPREHENSIVE METABOLIC PANEL WITH GFR - Abnormal; Notable for the following components:   Sodium 133 (*)    Glucose, Bld 104 (*)    BUN 6 (*)    Albumin 3.0 (*)  Alkaline Phosphatase 214 (*)    All other components within normal limits  LIPASE, BLOOD  URINALYSIS, ROUTINE W REFLEX MICROSCOPIC    EKG: EKG Interpretation Date/Time:  Tuesday March 01 2024 12:32:00 EST Ventricular Rate:  91 PR Interval:  106 QRS Duration:  68 QT Interval:  360 QTC Calculation: 442 R Axis:   36  Text Interpretation: Sinus rhythm with short PR Otherwise normal ECG When compared with ECG of 28-Feb-2024 12:41, PREVIOUS ECG IS PRESENT Confirmed by Neysa Clap [phone]) on 03/01/2024 3:56:13 PM  Radiology: [facility] Knee 2 Views Right Result Date: 03/01/2024 EXAM: 1 OR 2 VIEW(S) XRAY OF THE KNEE 03/01/2024 12:27:00 PM COMPARISON: None available. CLINICAL HISTORY: knee pain FINDINGS: BONES AND JOINTS: No acute fracture. No focal osseous lesion. No joint dislocation. No significant joint effusion. Small superior patellar enthesophyte. Mild  tricompartmental joint space loss with subchondral sclerosis. Chondrocalcinosis. SOFT TISSUES: The soft tissues are unremarkable. IMPRESSION: 1. Mild tricompartmental joint space loss. 2. Medial and lateral compartment chondrocalcinosis. 3. Small superior patellar enthesophyte. Electronically signed by: Donnice Mania MD 03/01/2024 01:00 PM EST RP Workstation: [id number]     Procedures   Medications Ordered in the ED  lactated ringers  bolus 1,000 mL (1,000 mLs Intravenous New Bag/Given 03/01/24 1515)                                    Medical Decision Making This is a 78 year old female presenting emergency department for weakness.  She is afebrile nontachycardic, blood pressure slightly soft.  Physical exam without overt infection.  Does have some symmetric mild weakness in lower extremities.  Husband notes that she will intermittently ambulate with a cane, but needing to use it more the past 4 to 5 days and is having a difficult time getting up and down stairs.  No reported fevers or chills, not having back pain numbness in the legs.  No focal deficits to suggest acute stroke.  Not having chest pain or shortness of breath.  Screening labs with maybe slight dehydration low sodium at 133, but no AKI.  No transaminitis to suggest acute hepatobiliary disease.  Lipase is normal.  Pancreatitis unlikely.  CBC without leukocytosis.  Has a stable anemia compared to prior labs.  EKG not consistent with ACS as I do not appreciate ischemic changes on my independent interpretation.  Patient given liter of IV fluids.  Care signed out to afternoon team; dispo pending UA and ambulatory trial.   Amount and/or Complexity of Data Reviewed Labs: ordered. Radiology: ordered. ECG/medicine tests: ordered.  Risk Decision regarding hospitalization. Diagnosis or treatment significantly limited by social determinants of health. Risk Details: Ambulatory dysfunction at baseline, lives at home with steps in  house        Final diagnoses:  None    ED Discharge Orders     None          Neysa Clap PARAS, DO 03/01/24 1606

## 2024-03-02 ENCOUNTER — Telehealth: Payer: Self-pay

## 2024-03-02 NOTE — Telephone Encounter (Signed)
 Left vm for patient at [phone]. HH order never received from ED MD. Therefore CM encouraged patient to follow-up with her pcp for [facility] services should she still desire them.   Merilee Batty, MSN, RN Case Management [phone]

## 2024-03-17 ENCOUNTER — Ambulatory Visit: Payer: Self-pay | Admitting: Emergency Medicine

## 2024-03-17 ENCOUNTER — Encounter: Payer: Self-pay | Admitting: Emergency Medicine

## 2024-03-17 ENCOUNTER — Ambulatory Visit: Admitting: Emergency Medicine

## 2024-03-17 VITALS — BP 140/80 | HR 90 | Temp 97.9°F | Ht 67.0 in | Wt 179.0 lb

## 2024-03-17 DIAGNOSIS — Z130 Encounter for screening for diseases of the blood and blood-forming organs and certain disorders involving the immune mechanism: Secondary | ICD-10-CM

## 2024-03-17 DIAGNOSIS — E785 Hyperlipidemia, unspecified: Secondary | ICD-10-CM

## 2024-03-17 DIAGNOSIS — Z0001 Encounter for general adult medical examination with abnormal findings: Secondary | ICD-10-CM

## 2024-03-17 DIAGNOSIS — I10 Essential (primary) hypertension: Secondary | ICD-10-CM

## 2024-03-17 LAB — COMPREHENSIVE METABOLIC PANEL WITH GFR
ALT: 7 U/L (ref 0–35)
AST: 15 U/L (ref 0–37)
Albumin: 3.7 g/dL (ref 3.5–5.2)
Alkaline Phosphatase: 100 U/L (ref 39–117)
BUN: 12 mg/dL (ref 6–23)
CO2: 30 meq/L (ref 19–32)
Calcium: 10.2 mg/dL (ref 8.4–10.5)
Chloride: 104 meq/L (ref 96–112)
Creatinine, Ser: 0.77 mg/dL (ref 0.40–1.20)
GFR: 73.92 mL/min (ref >60.00)
Glucose, Bld: 105 mg/dL — ABNORMAL HIGH (ref 70–99)
Potassium: 3.7 meq/L (ref 3.5–5.1)
Sodium: 140 meq/L (ref 135–145)
Total Bilirubin: 0.5 mg/dL (ref 0.2–1.2)
Total Protein: 7.4 g/dL (ref 6.0–8.3)

## 2024-03-17 LAB — HEMOGLOBIN A1C: Hgb A1c MFr Bld: 5.8 % (ref 4.6–6.5)

## 2024-03-17 LAB — CBC WITH DIFFERENTIAL/PLATELET
Basophils Absolute: 0 K/uL (ref 0.0–0.1)
Basophils Relative: 0.7 % (ref 0.0–3.0)
Eosinophils Absolute: 0.1 K/uL (ref 0.0–0.7)
Eosinophils Relative: 1.7 % (ref 0.0–5.0)
HCT: 32.8 % — ABNORMAL LOW (ref 36.0–46.0)
Hemoglobin: 10.6 g/dL — ABNORMAL LOW (ref 12.0–15.0)
Lymphocytes Relative: 23.3 % (ref 12.0–46.0)
Lymphs Abs: 1.3 K/uL (ref 0.7–4.0)
MCHC: 32.4 g/dL (ref 30.0–36.0)
MCV: 82.2 fl (ref 78.0–100.0)
Monocytes Absolute: 0.4 K/uL (ref 0.1–1.0)
Monocytes Relative: 6.6 % (ref 3.0–12.0)
Neutro Abs: 3.7 K/uL (ref 1.4–7.7)
Neutrophils Relative %: 67.7 % (ref 43.0–77.0)
Platelets: 224 K/uL (ref 150.0–400.0)
RBC: 3.98 Mil/uL (ref 3.87–5.11)
RDW: 14.4 % (ref 11.5–15.5)
WBC: 5.5 K/uL (ref 4.0–10.5)

## 2024-03-17 LAB — LIPID PANEL
Cholesterol: 183 mg/dL (ref 0–200)
HDL: 44.2 mg/dL (ref >39.00)
LDL Cholesterol: 122 mg/dL — ABNORMAL HIGH (ref 0–99)
NonHDL: 139.24
Total CHOL/HDL Ratio: 4
Triglycerides: 87 mg/dL (ref 0.0–149.0)
VLDL: 17.4 mg/dL (ref 0.0–40.0)

## 2024-03-17 LAB — VITAMIN B12: Vitamin B-12: 720 pg/mL (ref 211–911)

## 2024-03-17 LAB — VITAMIN D 25 HYDROXY (VIT D DEFICIENCY, FRACTURES): VITD: 21 ng/mL — ABNORMAL LOW (ref 30.00–100.00)

## 2024-03-17 LAB — TSH: TSH: 0.81 u[IU]/mL (ref 0.35–5.50)

## 2024-03-17 NOTE — Progress Notes (Addendum)
 Danielle Kirby 78 y.o.   Chief Complaint  Patient presents with   Annual Exam    No concerns    HISTORY OF PRESENT ILLNESS: This is a 78 y.o. female here for annual exam and follow-up of chronic medical conditions including hypertension Recently seen in emergency department 03/01/2024 for weakness Blood work results reviewed with patient.  It shows hyponatremia. No complaints today or any other medical concerns.  HPI   Prior to Admission medications  Medication Sig Start Date End Date Taking? Authorizing Provider  Multiple Vitamin (MULTIVITAMIN WITH MINERALS) TABS tablet Take 1 tablet by mouth daily. 08/29/20  Yes Angiulli, Toribio PARAS, PA-C  ROCKLATAN 0.02-0.005 % SOLN Place 1 drop into both eyes at bedtime. 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   Degenerative arthritis of hip 05/23/2011    Past Medical History:  Diagnosis Date   Allergy    Arthritis    Cataract    Heart murmur     Past Surgical History:  Procedure Laterality Date   AUGMENTATION MAMMAPLASTY Bilateral    pt states implants removed, but are still there   BREAST SURGERY     COSMETIC SURGERY     EYE SURGERY     TUBAL LIGATION      Social History   Socioeconomic History   Marital status: Significant Other    Spouse name: Not on file   Number of children: Not on file   Years of education: Not on file   Highest education level: Not on file  Occupational History   Not on file  Tobacco Use   Smoking status: Never   Smokeless tobacco: Never  Substance and Sexual Activity   Alcohol use: No     Alcohol/week: 0.0 standard drinks of alcohol   Drug use: No   Sexual activity: Yes    Birth control/protection: None  Other Topics Concern   Not on file  Social History Narrative   Not on file   Social Drivers of Health   Tobacco Use: Low Risk (03/17/2024)   Patient History    Smoking Tobacco Use: Never    Smokeless Tobacco Use: Never    Passive Exposure: Not on file  Financial Resource Strain: Not on file  Food Insecurity: Not on file  Transportation Needs: Not on file  Physical Activity: Not on file  Stress: Not on file  Social Connections: Not on file  Intimate Partner Violence: Not on file  Depression (PHQ2-9): Low Risk (11/03/2023)   Depression (PHQ2-9)    PHQ-2 Score: 0  Alcohol Screen: Not on file  Housing: Not on file  Utilities: Not on file  Health Literacy: Not on file    Family History  Problem Relation Age of Onset   Diabetes Mother    Hypertension Father      Review of Systems  Constitutional: Negative.  Negative for chills and fever.  HENT: Negative.  Negative for congestion and sore throat.   Respiratory: Negative.  Negative for cough and shortness of breath.   Cardiovascular: Negative.  Negative for chest pain and palpitations.  Gastrointestinal:  Negative  for abdominal pain, diarrhea, nausea and vomiting.  Skin: Negative.  Negative for rash.  Neurological: Negative.  Negative for dizziness and headaches.  All other systems reviewed and are negative.   Vitals:   03/17/24 0958 03/17/24 1010  BP: (!) 140/80 (!) 140/80  Pulse: 90   Temp: 97.9 F (36.6 C)   SpO2: 94%     Physical Exam Vitals reviewed.  Constitutional:      Appearance: Normal appearance.  HENT:     Head: Normocephalic.     Mouth/Throat:     Mouth: Mucous membranes are moist.     Pharynx: Oropharynx is clear.  Eyes:     Extraocular Movements: Extraocular movements intact.     Conjunctiva/sclera: Conjunctivae normal.     Pupils: Pupils are equal, round, and  reactive to light.  Cardiovascular:     Rate and Rhythm: Normal rate and regular rhythm.     Pulses: Normal pulses.     Heart sounds: Murmur heard.  Pulmonary:     Effort: Pulmonary effort is normal.     Breath sounds: Normal breath sounds.  Abdominal:     Palpations: Abdomen is soft.     Tenderness: There is no abdominal tenderness.  Musculoskeletal:     Cervical back: No tenderness.  Lymphadenopathy:     Cervical: No cervical adenopathy.  Skin:    General: Skin is warm and dry.     Capillary Refill: Capillary refill takes less than 2 seconds.  Neurological:     General: No focal deficit present.     Mental Status: She is alert and oriented to person, place, and time.  Psychiatric:        Mood and Affect: Mood normal.        Behavior: Behavior normal.      ASSESSMENT & PLAN: Problem List Items Addressed This Visit       Cardiovascular and Mediastinum   Essential hypertension (Chronic)   BP Readings from Last 3 Encounters:  03/17/24 (!) 140/80  03/01/24 (!) 169/95  02/28/24 (!) 136/90  Elevated blood pressure reading in the office today Presently not on medication Recommend to start amlodipine  5 mg daily Cardiovascular risks associated with hypertension discussed Blood work today and follow-up in 6 months       Relevant Medications   amLODipine  (NORVASC ) 5 MG tablet   Other Relevant Orders   Comprehensive metabolic panel with GFR   CBC with Differential/Platelet     Other   Dyslipidemia   Stable.  Diet and nutrition discussed. Continue rosuvastatin  10 mg daily. Blood work done today including lipid profile      Relevant Orders   Comprehensive metabolic panel with GFR   Lipid panel   Other Visit Diagnoses       Encounter for general adult medical examination with abnormal findings    -  Primary   Relevant Orders   Comprehensive metabolic panel with GFR   CBC with Differential/Platelet   Lipid panel   Hemoglobin A1c   TSH   Vitamin B12   VITAMIN  D 25 Hydroxy (Vit-D Deficiency, Fractures)     Screening for deficiency anemia       Relevant Orders   CBC with Differential/Platelet     Screening for endocrine, metabolic and immunity disorder          Modifiable risk factors discussed with patient. Anticipatory guidance according to age provided. The following topics were also discussed: Social Determinants of Health Smoking.  Non-smoker Diet and nutrition  Benefits of exercise Cancer screening and review of most recent colonoscopy report and mammogram reports Vaccinations review and recommendations Cardiovascular risk assessment Diagnosis of hypertension and cardiovascular risk associated with this condition Need for antihypertensive medication Mental health including depression and anxiety Fall and accident prevention  Patient Instructions  Health Maintenance, Female Adopting a healthy lifestyle and getting preventive care are important in promoting health and wellness. Ask your health care provider about: The right schedule for you to have regular tests and exams. Things you can do on your own to prevent diseases and keep yourself healthy. What should I know about diet, weight, and exercise? Eat a healthy diet  Eat a diet that includes plenty of vegetables, fruits, low-fat dairy products, and lean protein. Do not eat a lot of foods that are high in solid fats, added sugars, or sodium. Maintain a healthy weight Body mass index (BMI) is used to identify weight problems. It estimates body fat based on height and weight. Your health care provider can help determine your BMI and help you achieve or maintain a healthy weight. Get regular exercise Get regular exercise. This is one of the most important things you can do for your health. Most adults should: Exercise for at least 150 minutes each week. The exercise should increase your heart rate and make you sweat (moderate-intensity exercise). Do strengthening exercises at least  twice a week. This is in addition to the moderate-intensity exercise. Spend less time sitting. Even light physical activity can be beneficial. Watch cholesterol and blood lipids Have your blood tested for lipids and cholesterol at 78 years of age, then have this test every 5 years. Have your cholesterol levels checked more often if: Your lipid or cholesterol levels are high. You are older than 78 years of age. You are at high risk for heart disease. What should I know about cancer screening? Depending on your health history and family history, you may need to have cancer screening at various ages. This may include screening for: Breast cancer. Cervical cancer. Colorectal cancer. Skin cancer. Lung cancer. What should I know about heart disease, diabetes, and high blood pressure? Blood pressure and heart disease High blood pressure causes heart disease and increases the risk of stroke. This is more likely to develop in people who have high blood pressure readings or are overweight. Have your blood pressure checked: Every 3-5 years if you are 63-26 years of age. Every year if you are 59 years old or older. Diabetes Have regular diabetes screenings. This checks your fasting blood sugar level. Have the screening done: Once every three years after age 28 if you are at a normal weight and have a low risk for diabetes. More often and at a younger age if you are overweight or have a high risk for diabetes. What should I know about preventing infection? Hepatitis B If you have a higher risk for hepatitis B, you should be screened for this virus. Talk with your health care provider to find out if you are at risk for hepatitis B infection. Hepatitis C Testing is recommended for: Everyone born from [birth year] through 1965. Anyone with known risk factors for hepatitis C. Sexually transmitted infections (STIs) Get screened for STIs, including gonorrhea and chlamydia, if: You are sexually active and are  younger than 78 years of age. You are older than 78 years of age and your health care provider tells you that you are at risk for this type of infection. Your sexual activity has changed since  you were last screened, and you are at increased risk for chlamydia or gonorrhea. Ask your health care provider if you are at risk. Ask your health care provider about whether you are at high risk for HIV. Your health care provider may recommend a prescription medicine to help prevent HIV infection. If you choose to take medicine to prevent HIV, you should first get tested for HIV. You should then be tested every 3 months for as long as you are taking the medicine. Pregnancy If you are about to stop having your period (premenopausal) and you may become pregnant, seek counseling before you get pregnant. Take 400 to 800 micrograms (mcg) of folic acid every day if you become pregnant. Ask for birth control (contraception) if you want to prevent pregnancy. Osteoporosis and menopause Osteoporosis is a disease in which the bones lose minerals and strength with aging. This can result in bone fractures. If you are 95 years old or older, or if you are at risk for osteoporosis and fractures, ask your health care provider if you should: Be screened for bone loss. Take a calcium  or vitamin D  supplement to lower your risk of fractures. Be given hormone replacement therapy (HRT) to treat symptoms of menopause. Follow these instructions at home: Alcohol use Do not drink alcohol if: Your health care provider tells you not to drink. You are pregnant, may be pregnant, or are planning to become pregnant. If you drink alcohol: Limit how much you have to: 0-1 drink a day. Know how much alcohol is in your drink. In the [place] one drink equals one 12 oz bottle of beer (355 mL), one 5 oz glass of wine (148 mL), or one 1 oz glass of hard liquor (44 mL). Lifestyle Do not use any products that contain nicotine or tobacco. These  products include cigarettes, chewing tobacco, and vaping devices, such as e-cigarettes. If you need help quitting, ask your health care provider. Do not use street drugs. Do not share needles. Ask your health care provider for help if you need support or information about quitting drugs. General instructions Schedule regular health, dental, and eye exams. Stay current with your vaccines. Tell your health care provider if: You often feel depressed. You have ever been abused or do not feel safe at home. Summary Adopting a healthy lifestyle and getting preventive care are important in promoting health and wellness. Follow your health care provider's instructions about healthy diet, exercising, and getting tested or screened for diseases. Follow your health care provider's instructions on monitoring your cholesterol and blood pressure. This information is not intended to replace advice given to you by your health care provider. Make sure you discuss any questions you have with your health care provider. Document Revised: 08/13/2020 Document Reviewed: 08/13/2020 Elsevier Patient Education  2024 Elsevier Inc.     Emil Schaumann, MD [facility]      [1] Allergies Allergen Reactions   Penicillins     REACTION: hives

## 2024-03-17 NOTE — Assessment & Plan Note (Signed)
 BP Readings from Last 3 Encounters:  03/17/24 (!) 140/80  03/01/24 (!) 169/95  02/28/24 (!) 136/90  Elevated blood pressure reading in the office today Presently not on medication Recommend to start amlodipine  5 mg daily Cardiovascular risks associated with hypertension discussed Blood work today and follow-up in 6 months

## 2024-03-17 NOTE — Patient Instructions (Signed)

## 2024-03-17 NOTE — Progress Notes (Unsigned)
° °  Acute Office Visit  Subjective:     Patient ID: Danielle Kirby, female    DOB: 12-20-1945, 78 y.o.   MRN: [id number]  No chief complaint on file.   HPI  Discussed the use of AI scribe software for clinical note transcription with the patient, who gave verbal consent to proceed.  History of Present Illness      ROS Per HPI      Objective:    There were no vitals taken for this visit.   Physical Exam Vitals and nursing note reviewed.  Constitutional:      General: She is not in acute distress.    Appearance: Normal appearance. She is normal weight.  HENT:     Head: Normocephalic and atraumatic.     Right Ear: External ear normal.     Left Ear: External ear normal.     Nose: Nose normal.     Mouth/Throat:     Mouth: Mucous membranes are moist.     Pharynx: Oropharynx is clear.  Eyes:     Extraocular Movements: Extraocular movements intact.     Pupils: Pupils are equal, round, and reactive to light.  Cardiovascular:     Rate and Rhythm: Normal rate and regular rhythm.     Pulses: Normal pulses.     Heart sounds: Normal heart sounds.  Pulmonary:     Effort: Pulmonary effort is normal. No respiratory distress.     Breath sounds: Normal breath sounds. No wheezing, rhonchi or rales.  Musculoskeletal:        General: Normal range of motion.     Cervical back: Normal range of motion.     Right lower leg: No edema.     Left lower leg: No edema.  Lymphadenopathy:     Cervical: No cervical adenopathy.  Neurological:     General: No focal deficit present.     Mental Status: She is alert and oriented to person, place, and time.  Psychiatric:        Mood and Affect: Mood normal.        Thought Content: Thought content normal.     No results found for any visits on 03/18/24.      Assessment & Plan:   Assessment and Plan Assessment & Plan      No orders of the defined types were placed in this encounter.    No orders of the defined types were placed  in this encounter.   No follow-ups on file.  Corean LITTIE Ku, FNP

## 2024-03-17 NOTE — Assessment & Plan Note (Signed)
 Stable.  Diet and nutrition discussed. Continue rosuvastatin  10 mg daily. Blood work done today including lipid profile

## 2024-03-18 ENCOUNTER — Ambulatory Visit: Admitting: Family Medicine

## 2024-03-18 ENCOUNTER — Emergency Department (HOSPITAL_BASED_OUTPATIENT_CLINIC_OR_DEPARTMENT_OTHER)

## 2024-03-18 ENCOUNTER — Emergency Department (HOSPITAL_BASED_OUTPATIENT_CLINIC_OR_DEPARTMENT_OTHER)
Admission: EM | Admit: 2024-03-18 | Discharge: 2024-03-18 | Discharge status: Against Medical Advice | Source: Ambulatory Visit | Attending: Emergency Medicine | Admitting: Emergency Medicine

## 2024-03-18 ENCOUNTER — Encounter (HOSPITAL_BASED_OUTPATIENT_CLINIC_OR_DEPARTMENT_OTHER): Payer: Self-pay

## 2024-03-18 ENCOUNTER — Encounter: Payer: Self-pay | Admitting: Family Medicine

## 2024-03-18 ENCOUNTER — Other Ambulatory Visit: Payer: Self-pay

## 2024-03-18 VITALS — BP 134/80 | HR 79 | Temp 98.7°F | Wt 179.0 lb

## 2024-03-18 DIAGNOSIS — R413 Other amnesia: Secondary | ICD-10-CM

## 2024-03-18 DIAGNOSIS — T8543XA Leakage of breast prosthesis and implant, initial encounter: Secondary | ICD-10-CM | POA: Diagnosis not present

## 2024-03-18 DIAGNOSIS — N644 Mastodynia: Secondary | ICD-10-CM | POA: Diagnosis present

## 2024-03-18 DIAGNOSIS — L089 Local infection of the skin and subcutaneous tissue, unspecified: Secondary | ICD-10-CM

## 2024-03-18 DIAGNOSIS — N63 Unspecified lump in unspecified breast: Secondary | ICD-10-CM

## 2024-03-18 DIAGNOSIS — Z5329 Procedure and treatment not carried out because of patient's decision for other reasons: Secondary | ICD-10-CM | POA: Diagnosis not present

## 2024-03-18 DIAGNOSIS — N611 Abscess of the breast and nipple: Secondary | ICD-10-CM

## 2024-03-18 DIAGNOSIS — Y762 Prosthetic and other implants, materials and accessory obstetric and gynecological devices associated with adverse incidents: Secondary | ICD-10-CM | POA: Diagnosis not present

## 2024-03-18 LAB — CBC WITH DIFFERENTIAL/PLATELET
Abs Immature Granulocytes: 0.01 K/uL (ref 0.00–0.07)
Basophils Absolute: 0 K/uL (ref 0.0–0.1)
Basophils Relative: 1 %
Eosinophils Absolute: 0.1 K/uL (ref 0.0–0.5)
Eosinophils Relative: 2 %
HCT: 33.2 % — ABNORMAL LOW (ref 36.0–46.0)
Hemoglobin: 10.5 g/dL — ABNORMAL LOW (ref 12.0–15.0)
Immature Granulocytes: 0 %
Lymphocytes Relative: 40 %
Lymphs Abs: 2.5 K/uL (ref 0.7–4.0)
MCH: 26.5 pg (ref 26.0–34.0)
MCHC: 31.6 g/dL (ref 30.0–36.0)
MCV: 83.8 fL (ref 80.0–100.0)
Monocytes Absolute: 0.5 K/uL (ref 0.1–1.0)
Monocytes Relative: 7 %
Neutro Abs: 3.1 K/uL (ref 1.7–7.7)
Neutrophils Relative %: 50 %
Platelets: 210 K/uL (ref 150–400)
RBC: 3.96 MIL/uL (ref 3.87–5.11)
RDW: 14 % (ref 11.5–15.5)
WBC: 6.2 K/uL (ref 4.0–10.5)
nRBC: 0 % (ref 0.0–0.2)

## 2024-03-18 LAB — BASIC METABOLIC PANEL WITH GFR
Anion gap: 12 (ref 5–15)
BUN: 8 mg/dL (ref 8–23)
CO2: 27 mmol/L (ref 22–32)
Calcium: 10.5 mg/dL — ABNORMAL HIGH (ref 8.9–10.3)
Chloride: 103 mmol/L (ref 98–111)
Creatinine, Ser: 0.66 mg/dL (ref 0.44–1.00)
GFR, Estimated: >60 mL/min (ref >60)
Glucose, Bld: 111 mg/dL — ABNORMAL HIGH (ref 70–99)
Potassium: 3.7 mmol/L (ref 3.5–5.1)
Sodium: 142 mmol/L (ref 135–145)

## 2024-03-18 MED ORDER — HALOPERIDOL LACTATE 5 MG/ML IJ SOLN
2.5000 mg | Freq: Once | INTRAMUSCULAR | Status: DC
  Filled 2024-03-18: qty 1

## 2024-03-18 MED ORDER — SODIUM CHLORIDE 0.9 % IV SOLN: INTRAVENOUS | Status: DC | PRN

## 2024-03-18 MED ORDER — IOHEXOL 300 MG/ML  SOLN
100.0000 mL | Freq: Once | INTRAMUSCULAR | Status: AC | PRN
  Administered 2024-03-18: 80 mL via INTRAVENOUS

## 2024-03-18 MED ORDER — CLINDAMYCIN HCL 150 MG PO CAPS: 300.0000 mg | ORAL_CAPSULE | Freq: Three times a day (TID) | ORAL | 0 refills | Status: AC

## 2024-03-18 MED ORDER — ONDANSETRON HCL 4 MG/2ML IJ SOLN
4.0000 mg | Freq: Once | INTRAMUSCULAR | Status: DC
  Filled 2024-03-18: qty 2

## 2024-03-18 MED ORDER — FENTANYL CITRATE (PF) 50 MCG/ML IJ SOSY
50.0000 ug | PREFILLED_SYRINGE | Freq: Once | INTRAMUSCULAR | Status: DC
  Filled 2024-03-18: qty 1

## 2024-03-18 MED ORDER — LORAZEPAM 2 MG/ML IJ SOLN
1.0000 mg | Freq: Once | INTRAMUSCULAR | Status: DC
  Filled 2024-03-18: qty 1

## 2024-03-18 MED ORDER — SODIUM CHLORIDE 0.9 % IV SOLN
1.0000 g | Freq: Once | INTRAVENOUS | Status: AC
  Administered 2024-03-18: 1 g via INTRAVENOUS
  Filled 2024-03-18: qty 10

## 2024-03-18 MED ORDER — LIDOCAINE-EPINEPHRINE-TETRACAINE (LET) TOPICAL GEL
3.0000 mL | Freq: Once | TOPICAL | Status: AC
  Administered 2024-03-18: 3 mL via TOPICAL
  Filled 2024-03-18: qty 3

## 2024-03-18 NOTE — ED Provider Notes (Signed)
 [place] EMERGENCY DEPARTMENT AT [facility] Provider Note   CSN: [id number] Arrival date & time: 03/18/24  1019     Patient presents with: Abscess   Danielle Kirby is a 78 y.o. female with a past medical history of chronic breast disease bilateral nipple discharge abnormal mammograms wh hypertension was sent in for evaluation of abscess of the right breast.  Patient reports that this has been only going on for about 1 week however husband states has been going on for quite some time longer.  She reports pain swelling and tenderness of the right breast.    Abscess      Prior to Admission medications  Medication Sig Start Date End Date Taking? Authorizing Provider  amLODipine  (NORVASC ) 5 MG tablet Take 1 tablet (5 mg total) by mouth daily. Patient not taking: Reported on 03/18/2024 03/17/24   [place] Emil Schanz, MD  Multiple Vitamin (MULTIVITAMIN WITH MINERALS) TABS tablet Take 1 tablet by mouth daily. 08/29/20   Angiulli, Toribio PARAS, PA-C  ROCKLATAN 0.02-0.005 % SOLN Place 1 drop into both eyes at bedtime. 07/10/23   [provider]    Allergies: Penicillins    Review of Systems  Updated Vital Signs BP (!) 156/86   Pulse 69   Temp 97.6 F (36.4 C) (Oral)   Resp 16   SpO2 100%   Physical Exam Vitals and nursing note reviewed.  Constitutional:      General: She is not in acute distress.    Appearance: She is well-developed. She is not diaphoretic.  HENT:     Head: Normocephalic and atraumatic.     Right Ear: External ear normal.     Left Ear: External ear normal.     Nose: Nose normal.     Mouth/Throat:     Mouth: Mucous membranes are moist.  Eyes:     General: No scleral icterus.    Conjunctiva/sclera: Conjunctivae normal.  Cardiovascular:     Rate and Rhythm: Normal rate and regular rhythm.     Heart sounds: Normal heart sounds. No murmur heard.    No friction rub. No gallop.  Pulmonary:     Effort: Pulmonary effort is normal. No  respiratory distress.     Breath sounds: Normal breath sounds.  Chest:     Comments: Right breast is hard, red, tender to palpation with a 5 cm circular ulceration with discharge at the breast sitting approximately 1 or 2:00.  No obvious nipple discharge or peau d'orange Abdominal:     General: Bowel sounds are normal. There is no distension.     Palpations: Abdomen is soft. There is no mass.     Tenderness: There is no abdominal tenderness. There is no guarding.  Musculoskeletal:     Cervical back: Normal range of motion.  Skin:    General: Skin is warm and dry.  Neurological:     Mental Status: She is alert and oriented to person, place, and time.  Psychiatric:        Behavior: Behavior normal.     (all labs ordered are listed, but only abnormal results are displayed) Labs Reviewed - No data to display  EKG: None  Radiology: No results found.   Procedures   Medications Ordered in the ED - No data to display  Clinical Course as of 03/18/24 1823  Fri Mar 18, 2024  1321 Patient was interpreted CT findings which show ruptured silicone implant with superimposed infection. At bedside I discussed the findings with the  patient who then promptly replied that she did not have breast implants.  She does not seem to understand what is happening.  I then asked her husband if she has been having memory problems at home he replied yes seemed to look a little bit defeated. [AH]  1321 I further reviewed the patient's charts which shows that she has been having bloody nipple discharge bilaterally for the past 2 years has had outpatient mammography and MRI that show ruptured implants bilaterally.  She was referred to outpatient plastic surgery in May of this year but declined the referral because she said she did not need them because she had already had her blast breast implants removed. Have strong concerns for underlying dementia in this patient.  I have reached out to on-call plastic surgery  at [facility] as I do not have any on-call plastics at [facility].  [AH]  1322 CBC with Differential(!) [AH]  1322 Basic metabolic panel(!) Labs are otherwise reassuring patient does not have elevated white blood cell count. [AH]  1349 Patient now very argumentative and even with increasing confusion.  She is oriented to name and month only.  She does not know where she is or why she is here.  She cannot remember conversations that we have had within the last 10 minutes.  She does not remember that I have personally been in her room for times both for assessment and procedure.  She is refusing all treatment.  I do not think that she has full capacity giving her disorientation [AH]  1455 Patient's husband able to convince her to take her IV antibiotics here.  She is treated with IV Rocephin .  At this time I think she might be  [AH]  1534 I have not heard back from [facility]. Will place calls to [facility] for discussion [AH]  1642 Unable to talk with out patient Plastics Case discussed with Dr. Vernetta general surgery. Agrees with plan for antibiotics, Breast center imaging referral, and close clinic follow up. [AH]    Clinical Course User Index [AH] Arloa Chroman, PA-C                                 Medical Decision Making Amount and/or Complexity of Data Reviewed Labs: ordered. Decision-making details documented in ED Course. Radiology: ordered.  Risk Prescription drug management.   Patient here with right breast abscess. Labs and imaging reviewed.  Labs are reassuring as per ED course.  Imaging which I personally visualized and interpreted shows ruptured breast implant with superimposed infection.  This is a known issue for the patient.  Further complicating factors include that the patient very likely has undiagnosed dementia. She has extremely poor functional memory.  Patient did not know who I was even though it had been in the room for 5 times and had conversations that she could  not remember.  She became very angry when we tried to restart her IV antibiotic medications stating that she did not know who we are or who the nurse was and she did not know why anybody had let us  in her room.  Patient was only oriented to the month and her name. He has no existing healthcare power of attorney or legal guardianship and does not seem to have any understanding of events that are occurring or capacity to make decision on my evaluation today however she would likely need full neurologic assessment and/or psychiatric assessment for  capacity determination and evaluation of dementia.  I have given the patient a referral.  I spent a large portion of the day trying to get in touch with anyone in plastics as we do not have them on-call.  Currently the patient is not febrile does not have a high white count so I do not think she has to be admitted.  Her husband was able to convince her to take her IV antibiotics.  Unfortunately prior to me being able to have a conversation with the patient and her husband she ripped her IV out and they eloped together.  I was able to get in touch with the patient's husband on phone and let him know that I ordered clindamycin  to the [facility] in [place]  for her to take for treatment of her breast abscess. Will also leave the important AVS paperwork here at the front desk for him to pick up and he understands that he can come back anytime over the weekend for these very important instructions. Given the patient an ambulatory referral to the breast center for further imaging as per direction of the surgery group.  She also need to follow-up with general surgery for biopsy and other management to rule out underlying malignancy.  I have also gotten in touch with patient's primary care practice to let them know my concerns and to help continue to address these this patient's follow-up needs as she does not appear to understand her very significant medical needs  as evidenced by the fact that she has had known ruptured breast implants with bleeding nipple discharge for 2 years and has not successfully completed any follow-up regarding these issues.        Final diagnoses:  None    ED Discharge Orders     None          Arloa Chroman, PA-C 03/18/24 AMOS Dean Clarity, MD 03/21/24 [phone]

## 2024-03-18 NOTE — Patient Instructions (Signed)
 I am very concerned for the wound on your right breast.   I would like you to go directly to the Emergency Room for further evaluation and treatment.

## 2024-03-18 NOTE — ED Notes (Signed)
 Patient transported to CT

## 2024-03-18 NOTE — Discharge Instructions (Addendum)
 You should be contacted by neurology and the breast imaging center for follow up appointments You must CALL the surgery center on Monday to set up a follow up appointment for your breast infection.  The following patient information handout explains CT chest findings of a ruptured silicone breast implant with infection, treatment with antibiotics and surgery, and the need for breast imaging follow-up. The content is grounded in evidence regarding implant rupture diagnosis, infection management, and the importance of ruling out malignancy in patients with breast implants.[1][2][3][4][5][6][7][8][9]      ### Ruptured Breast Implant Information     **What Was Found on Your CT Scan?**      Your CT scan of the chest showed that your silicone breast implant has ruptured (broken) and there is an infection around it. A ruptured implant means the outer shell has broken, allowing the silicone material inside to leak out. The infection means bacteria have gotten into the area around the implant, causing inflammation and requiring treatment.      **Why This Happened**      Breast implants can rupture for several reasons. As implants get older, they naturally weaken over time and become more likely to break. Most implant ruptures happen without any symptoms, but when combined with infection, you may notice pain, swelling, redness, or warmth in the breast.[2][10][6][8]      **Your Treatment Plan**      **Antibiotics:** You have been prescribed clindamycin, an antibiotic that fights the bacteria causing your infection. It is very important to take all of your antibiotics exactly as prescribed, even if you start feeling better.[4][6]      **Surgery:** You will need surgery soon to remove the infected implant. This is urgent because leaving an infected implant in place can lead to serious complications. During surgery, your doctor will remove the implant and clean out any infection.[5][6] In most cases with  severe infection like yours, the implant cannot be saved and must be removed.[4][5]      **Follow-Up Breast Imaging**      After your surgery, you will need additional breast imaging tests. This is important for two reasons:      1. **To check for breast cancer:** Any woman with breast symptoms needs careful evaluation to make sure there is no underlying breast cancer. Your doctor will recommend the appropriate imaging tests based on your age and symptoms.[9][1]      2. **To plan future treatment:** If you want to have a new implant placed in the future, imaging will help your doctor plan the best approach.[5]      **When to Seek Immediate Medical Care**      Call your doctor right away or go to the emergency room if you experience any of these warning signs:      - Fever (temperature over 100.24F or 38C)      - Increasing redness, warmth, or swelling in your breast      - Severe or worsening pain that is not controlled by your pain medication      - Pus or foul-smelling drainage from your breast      - Red streaks spreading from your breast      - Feeling very ill, weak, or confused      - Nausea or vomiting      - Rapid heartbeat or difficulty breathing      These symptoms could mean the infection is getting worse and needs immediate treatment.[6][7][8]      **What to  Expect**      The treatment process may take time, but most women recover well after infected implant removal. Your surgical team will discuss options for breast reconstruction in the future if you are interested.[4][5] The most important thing right now is to treat the infection and remove the damaged implant safely.      **Questions to Ask Your Doctor**      - When is my surgery scheduled?      - How long will I need to take antibiotics?      - What imaging tests will I need after surgery?      - When can we discuss options for replacing the implant?      - What should I watch for during my recovery?       Remember, your healthcare team is here to help you through this process. Do not hesitate to call if you have questions or concerns about your treatment.         This patient handout provides clear, evidence-based information at an appropriate reading level while emphasizing the urgent need for surgery, antibiotic treatment, follow-up imaging to rule out malignancy, and warning signs requiring immediate medical attention. The information is grounded in current medical literature regarding implant rupture, infection management, and appropriate follow-up care.         Would you like me to summarize the current evidence on the optimal imaging modalities and timing for post-explant breast cancer surveillance in patients with prior implant rupture and infection? This could help guide your follow-up protocol and ensure malignancy is thoroughly ruled out.

## 2024-03-18 NOTE — ED Triage Notes (Signed)
 Pt reports was seen by PCP today for concerns of 'spot on right breast' ongoing x1 month. Pt reports PCP told her to come to [facility] ED for imaging of R breast, and was concerned for abscess and sepsis

## 2024-03-18 NOTE — ED Notes (Signed)
 This RN went to round on patient and assess for discharge, the pt and husband were not in the room and were not found anywhere in the department. The PA was able to call them to confirm that they have left. The PA informed them that she has a prescription to pick up and if they could come back and get some paperwork that the PA was wanting to give them. The husband was understanding of this information and said would pick up the prescription and paperwork.

## 2024-03-21 LAB — AEROBIC CULTURE W GRAM STAIN (SUPERFICIAL SPECIMEN)
Gram Stain: NONE SEEN
Special Requests: NORMAL

## 2024-03-22 ENCOUNTER — Telehealth (HOSPITAL_BASED_OUTPATIENT_CLINIC_OR_DEPARTMENT_OTHER): Payer: Self-pay | Admitting: *Deleted

## 2024-03-22 NOTE — Progress Notes (Signed)
 ED Antimicrobial Stewardship Positive Culture Follow Up   Danielle Kirby is an 78 y.o. female who presented to [facility] with a chief complaint of R breast abscess / cellulitis.  Chief Complaint  Patient presents with   Abscess    Recent Results (from the past 720 hours)  Aerobic Culture w Gram Stain (superficial specimen)     Status: None   Collection Time: 03/18/24 12:07 PM   Specimen: Wound; Abscess  Result Value Ref Range Status   Specimen Description   Final    WOUND Breast R Performed at [facility], [address], [place], [place] [address]    Special Requests   Final    Normal Performed at [facility], [address], [place], [place] [address]    Gram Stain   Final    NO WBC SEEN RARE GRAM POSITIVE COCCI Performed at [facility], [address], [place], [place] [address]    Culture FEW ENTEROCOCCUS FAECALIS  Final   Report Status 03/21/2024 FINAL  Final   Organism ID, Bacteria ENTEROCOCCUS FAECALIS  Final      Susceptibility   Enterococcus faecalis - MIC*    AMPICILLIN <=2 SENSITIVE Sensitive     VANCOMYCIN 1 SENSITIVE Sensitive     GENTAMICIN SYNERGY SENSITIVE Sensitive     * FEW ENTEROCOCCUS FAECALIS    Treated with clindamycin , organism resistant to prescribed antimicrobial  New antibiotic prescription: Linezolid 600 mg BID x 7 days  Call patient  If patient unable to afford or obtain this medication or if symptoms of breast abscess / cellulitis worsen, she will need to come back to hospital for evaluation   ED Provider: Lauraine Pickett PA    Rankin Sams 03/22/2024, 9:25 AM Clinical Pharmacist Monday - Friday phone -  [phone] Saturday - Sunday phone - [phone]

## 2024-03-22 NOTE — Telephone Encounter (Signed)
 Post ED Visit - Positive Culture Follow-up: Successful Patient Follow-Up  Culture assessed and recommendations reviewed by:  [x]  Rankin Sams, Pharm.D. []  Venetia Gully, Pharm.D., BCPS AQ-ID []  Garrel Crews, Pharm.D., BCPS []  Almarie Lunger, Pharm.D., BCPS []  [place], [address].D., BCPS, AAHIVP []  Rosaline Bihari, Pharm.D., BCPS, AAHIVP []  Vernell Meier, PharmD, BCPS []  Latanya Hint, PharmD, BCPS []  Donald Medley, PharmD, BCPS []  Rocky Bold, PharmD  Positive wound culture  []  Patient discharged without antimicrobial prescription and treatment is now indicated [x]  Organism is resistant to prescribed ED discharge antimicrobial []  Patient with positive blood cultures  Changes discussed with ED provider: Per Lauraine Pickett, PA: Stop clindamycin  and start Linezolid New antibiotic prescription: Linezolid 600 mg po BID x 7 days Called to [facility] in [place], [place]  Contacted patient, date 03/22/2024, time 1245   Lorita Barnie Pereyra 03/22/2024, 12:54 PM

## 2024-04-04 ENCOUNTER — Other Ambulatory Visit: Payer: Self-pay | Admitting: Surgery

## 2024-04-05 ENCOUNTER — Telehealth: Payer: Self-pay | Admitting: Emergency Medicine

## 2024-04-05 NOTE — Telephone Encounter (Unsigned)
 Copied from CRM #[id number]. Topic: Clinical - Medication Refill >> Apr 05, 2024 12:31 PM Jayma L wrote: Medication:  ROCKLATAN 0.02-0.005 % SOLN    Has the patient contacted their pharmacy? Yes (Agent: If no, request that the patient contact the pharmacy for the refill. If patient does not wish to contact the pharmacy document the reason why and proceed with request.) (Agent: If yes, when and what did the pharmacy advise?)  This is the patient's preferred pharmacy:   [id number] [place] [place] - [address] [place] [address] Phone: [phone] Fax: [phone] Hours: Not open 24 hours    Is this the correct pharmacy for this prescription? Yes If no, delete pharmacy and type the correct one.   Has the prescription been filled recently? No  Is the patient out of the medication? Yes  Has the patient been seen for an appointment in the last year OR does the patient have an upcoming appointment? Yes  Can we respond through MyChart? Yes  Agent: Please be advised that Rx refills may take up to 3 business days. We ask that you follow-up with your pharmacy.

## 2024-04-06 LAB — DERMATOLOGY PATHOLOGY

## 2024-04-06 MED ORDER — ROCKLATAN 0.02-0.005 % OP SOLN: OPHTHALMIC | 0 refills | Status: AC

## 2024-05-04 ENCOUNTER — Ambulatory Visit

## 2024-05-04 VITALS — BP 160/90 | HR 91 | Ht 67.0 in | Wt 173.0 lb

## 2024-05-04 DIAGNOSIS — T8544XA Capsular contracture of breast implant, initial encounter: Secondary | ICD-10-CM | POA: Diagnosis not present

## 2024-05-04 DIAGNOSIS — T8543XA Leakage of breast prosthesis and implant, initial encounter: Secondary | ICD-10-CM | POA: Diagnosis not present

## 2024-05-04 NOTE — Progress Notes (Signed)
 CLINIC CONSULT    NAME: Danielle Kirby MRN: [id number] DOB: 1945/06/12  Referring physician: Dr. Vernetta PCP: Purcell Emil Schanz, MD  CHIEF COMPLAINT: Possible Breast Implant Rupture and capsular contractures bilaterally.   HPI:  Danielle Kirby is a 79 y.o. year old female who presents with silicone implants. Does not recall how long ago these implants were placed. She has capsular contracture that includes distortion, pain, hardness. She is concerned about bilateral rupture. She has had an MRI that showed ruptured implants in 2024 and a CT scan that confirmed findings and a potential infection on the right side. She finished a course of antibiotics recently. She would like the implants and the capsules removed. She does not want a mastopexy.  Patient comes accompanied by husband today.  PMH: Past Medical History:  Diagnosis Date   Allergy    Arthritis    Cataract    Heart murmur     PSH: Past Surgical History:  Procedure Laterality Date   AUGMENTATION MAMMAPLASTY Bilateral    pt states implants removed, but are still there   BREAST SURGERY     COSMETIC SURGERY     EYE SURGERY     TUBAL LIGATION      MEDICATIONS:  Current Medications[1]  ALLERGIES:  is allergic to penicillins.  FAMILY HISTORY: Family History  Problem Relation Age of Onset   Diabetes Mother    Hypertension Father      EXAM:  Blood pressure (!) 160/90, pulse 91, height 5' 7 (1.702 m), weight 173 lb (78.5 kg), SpO2 100%. MA as chaperone Constitutional: Good color, good hydration. VSS.  Head and Neck: No lymphadenopathy, thyromegaly or masses Chest: Normal breathing, Normal shape and excursion.  Breasts: Grade 3 ptosis, No masses, nipple discharge or axillary lymphadenopathy. Grade 4 Capsular Contracture.  Patient has a wound on the right upper inner quadrant of the breast with active serous discharge.  No signs of active infection seroma noticed.    ASSESSMENT/PLAN The risks, benefits,  goals and alternatives of bilateral total capsulectomy and implant removal were discussed in detail.  We discussed the differences between the types of capsulectomy.  We will perform a total capsulectomy that may involve cauterization and curettage of areas that are very stuck to the chest wall.  Capsules will be sent to pathology as removal of the implants.  We will obtain pictures of the implants and capsules.  If we see anything unusual we would send further testing but otherwise we would not do any other tests.  Risks of pneumothorax chest wall injury bleeding infection irregularity and cosmetic concerns requiring revisional surgery were discussed in detail.  Revisional surgery may require financial burden.  Symmetry is often difficult once implants are removed.  Other risks of anesthesia including DVT PE and anesthetic complications were discussed also.  This operation does require placement drains also and this was discussed. Some patients require mastopexy.  The scars of the different types of mastopexy were discussed in detail.  Patient is not interested in mastopexy at this time. We obtained pictures today. Patient will require a mammogram (ordered) as well as PCP clearance before proceeding with surgery.  Instructed to go to the emergency department if she develops any signs or symptoms of right breast infection.  Patient and husband were allowed to ask us .  All questions answered to their satisfaction.  I spent 45 minutes in this consultation  Tully Burgo M. Caley Volkert, MD [facility]      [1]  Current Outpatient Medications:    Multiple Vitamin (MULTIVITAMIN WITH MINERALS) TABS tablet, Take 1 tablet by mouth daily., Disp: , Rfl:    amLODipine  (NORVASC ) 5 MG tablet, Take 1 tablet (5 mg total) by mouth daily. (Patient not taking: Reported on 05/04/2024), Disp: 90 tablet, Rfl: 3   clindamycin  (CLEOCIN ) 150 MG capsule, Take 2 capsules (300 mg total) by mouth 3 (three)  times daily. May dispense as 150mg  capsules (Patient not taking: Reported on 05/04/2024), Disp: 60 capsule, Rfl: 0   ROCKLATAN  0.02-0.005 % SOLN, Place 1 drop into both eyes at bedtime. (Patient not taking: Reported on 05/04/2024), Disp: 2.5 mL, Rfl: 0

## 2024-05-13 ENCOUNTER — Telehealth: Payer: Self-pay

## 2024-05-13 NOTE — Telephone Encounter (Signed)
 I have received surgical clarence forms from plastic surgery specialist and I have also contacted the patient and LVM letting her know that we are needing to get her scheduled

## 2024-09-21 ENCOUNTER — Ambulatory Visit: Admitting: Emergency Medicine
